# Patient Record
Sex: Female | Born: 1980
Health system: Southern US, Community
[De-identification: ages and names within clinical notes are randomized; demographics above are authoritative.]

## PROBLEM LIST (undated history)

## (undated) DIAGNOSIS — Z8489 Family history of other specified conditions: Secondary | ICD-10-CM

## (undated) DIAGNOSIS — C50912 Malignant neoplasm of unspecified site of left female breast: Secondary | ICD-10-CM

## (undated) DIAGNOSIS — Z853 Personal history of malignant neoplasm of breast: Secondary | ICD-10-CM

## (undated) DIAGNOSIS — T451X5A Adverse effect of antineoplastic and immunosuppressive drugs, initial encounter: Secondary | ICD-10-CM

## (undated) DIAGNOSIS — F419 Anxiety disorder, unspecified: Secondary | ICD-10-CM

## (undated) DIAGNOSIS — L818 Other specified disorders of pigmentation: Secondary | ICD-10-CM

## (undated) DIAGNOSIS — D649 Anemia, unspecified: Secondary | ICD-10-CM

## (undated) HISTORY — PX: PORTACATH PLACEMENT: SHX2246

---
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
## 1999-06-19 HISTORY — PX: WISDOM TOOTH EXTRACTION: SHX21

## 2004-09-02 ENCOUNTER — Emergency Department (HOSPITAL_COMMUNITY): Admission: EM | Admit: 2004-09-02 | Discharge: 2004-09-02 | Payer: Self-pay | Admitting: Emergency Medicine

## 2005-01-15 ENCOUNTER — Emergency Department (HOSPITAL_COMMUNITY): Admission: EM | Admit: 2005-01-15 | Discharge: 2005-01-16 | Payer: Self-pay | Admitting: Emergency Medicine

## 2010-11-17 ENCOUNTER — Other Ambulatory Visit: Payer: Self-pay | Admitting: Obstetrics and Gynecology

## 2010-11-17 ENCOUNTER — Encounter (HOSPITAL_COMMUNITY): Payer: 59

## 2010-11-17 LAB — CBC
HCT: 37.7 % (ref 36.0–46.0)
Hemoglobin: 12.6 g/dL (ref 12.0–15.0)
MCH: 31 pg (ref 26.0–34.0)
MCHC: 33.4 g/dL (ref 30.0–36.0)
MCV: 92.6 fL (ref 78.0–100.0)

## 2010-11-22 ENCOUNTER — Other Ambulatory Visit: Payer: Self-pay | Admitting: Obstetrics and Gynecology

## 2010-11-22 ENCOUNTER — Ambulatory Visit (HOSPITAL_COMMUNITY)
Admission: EM | Admit: 2010-11-22 | Discharge: 2010-11-23 | Disposition: A | Discharge status: Home / Self Care | Payer: 59 | Source: Ambulatory Visit | Attending: Obstetrics and Gynecology | Admitting: Obstetrics and Gynecology

## 2010-11-22 DIAGNOSIS — Z01818 Encounter for other preprocedural examination: Secondary | ICD-10-CM | POA: Insufficient documentation

## 2010-11-22 DIAGNOSIS — N9489 Other specified conditions associated with female genital organs and menstrual cycle: Secondary | ICD-10-CM | POA: Insufficient documentation

## 2010-11-22 DIAGNOSIS — N838 Other noninflammatory disorders of ovary, fallopian tube and broad ligament: Secondary | ICD-10-CM | POA: Insufficient documentation

## 2010-11-22 DIAGNOSIS — D279 Benign neoplasm of unspecified ovary: Secondary | ICD-10-CM | POA: Insufficient documentation

## 2010-11-22 DIAGNOSIS — Z01812 Encounter for preprocedural laboratory examination: Secondary | ICD-10-CM | POA: Insufficient documentation

## 2010-11-22 HISTORY — PX: SALPINGOOPHORECTOMY: SHX82

## 2010-11-23 LAB — CBC
HCT: 31.3 % — ABNORMAL LOW (ref 36.0–46.0)
Hemoglobin: 10.2 g/dL — ABNORMAL LOW (ref 12.0–15.0)
MCH: 30.7 pg (ref 26.0–34.0)
MCV: 94.3 fL (ref 78.0–100.0)
RBC: 3.32 MIL/uL — ABNORMAL LOW (ref 3.87–5.11)

## 2010-11-24 NOTE — Op Note (Signed)
Natasha Torres, Natasha Torres NO.:  [id number]  MEDICAL RECORD NO.:  [id number]  LOCATION:  9312                          FACILITY:  [facility]  PHYSICIAN:  Huel Cote, M.D. DATE OF BIRTH:  1980/10/31  DATE OF PROCEDURE:  11/22/2010 DATE OF DISCHARGE:                              OPERATIVE REPORT   PREOPERATIVE DIAGNOSIS:  Right adnexal mass.  POSTOPERATIVE DIAGNOSIS:  Right adnexal mass with large right ovarian cystic mass.  PROCEDURE:  Minilaparotomy, right salpingo-oophorectomy.  SURGEON:  Huel Cote, MD  ASSISTANT:  Malachi Pro. Ambrose Mantle, MD  ANESTHESIA:  General.  FINDINGS:  The left ovary and tube were normal.  The appendix appeared normal.  The uterus appeared normal.  The right ovary was enlarged with a 10 x 12 cm cystic mass which appeared to include several septations.  SPECIMEN:  The right ovary and tube were sent to Pathology.  ESTIMATED BLOOD LOSS:  50 mL.  URINE OUTPUT:  700 mL.  INTRAVENOUS FLUIDS:  1300 mL LR.  COMPLICATIONS:  There were no known complications.  PROCEDURE:  The patient was taken to the operating room where general anesthesia was obtained without difficulty.  She was then prepped and draped in the normal sterile fashion in the dorsal supine position. With a Foley catheter in place, a very small Pfannenstiel incision was made and carried through to the underlying layer of fascia by sharp dissection and Bovie cautery.  The fascia was then nicked in the midline and the incision extended laterally with Mayo scissors.  The inferior aspect was then grasped with Kocher clamps, elevated, and dissected off the underlying rectus muscles.  In a similar fashion, the superior aspect was dissected off the rectus muscles.  These were separated in the midline and the peritoneal cavity entered bluntly.  There was a small amount of free fluid noted and pelvic washings were then obtained and handed off to Pathology.  The large cystic mass  was readily visible at the midline and looking at the remainder of the pelvis appeared normal.  The left ovary and tube appeared normal and there were no other abnormal findings.  The mass was then carefully elevated and with some effort brought through the incision and was able to be externalized.  A Heaney clamp was then placed across the cornua at the right and a second Heaney clamp was placed across the infundibulopelvic ligament until the adnexa was completely isolated.  This was then removed with Mayo scissors.  The specimen was then handed off to Pathology and the pedicles were secured with two suture ligatures of 0 Vicryl.  One small area of bleeding at the cornua was treated with 3-0 Vicryl in an SH needle.  All then appeared hemostatic.  The left tube and ovary were once again inspected and found to be normal.  A very small paratubal cyst was removed with Bovie as it was a dangling all long pedicle.  The appendix appeared normal.  There was no evidence of endometriosis or other abnormal pelvic findings, therefore all instruments and sponges were removed from the abdomen.  The peritoneum and rectus muscles were then reapproximated with 0 Vicryl and interrupted mattress sutures.  The fascia was  then closed with 0 Vicryl in a running fashion.  The subcutaneous tissue was reapproximated with 2-0 Vicryl in a running fashion.  The skin was then closed with 3-0 Vicryl in a subcuticular stitch on a Keith needle and Dermabond.  Again, sponge, lap, and needle counts were correct x2 and the patient was taken to the recovery room, awakened, and in good condition.     Huel Cote, M.D.     KR/MEDQ  D:  11/22/2010  T:  11/23/2010  Job:  [id number]  Electronically Signed by Huel Cote M.D. on 11/24/2010 11:00:35 PM

## 2010-11-24 NOTE — H&P (Addendum)
NAME:  Natasha Torres, Natasha Torres NO.:  [id number]  MEDICAL RECORD NO.:  [id number]  LOCATION:  SDC                           [facility]:  [facility]  PHYSICIAN:  Huel Cote, M.D. DATE OF BIRTH:  [birth year]  DATE OF ADMISSION:  11/17/2010 DATE OF DISCHARGE:                             HISTORY & PHYSICAL   HISTORY OF PRESENT ILLNESS:  The patient is a 30 year old nulligravida female who is coming in for a scheduled mini laparotomy and excision of a large adnexal mass which was noted on pelvic exam at a routine physical and followed up by ultrasound.  The mass itself measures approximately 12 x 12 cm and has a few septations a 2 or 3 excrescences associated with this.  The patient really has no associated symptoms of pain or issues with the mass, however, given its size and slightly complex nature, we had discussed options and feel that surgical removal is the best option.  I did discuss her case with GYN Oncology prior to proceeding and they felt that by her sonographic findings and her age that the risk of malignancy was very low and that they did not need to be present for the surgery.  However, I did recommend removing with a mini laparotomy to avoid any spillage of the contents at the time of removal and wished [place] to do pelvic washings as well.  They also did not recommend any ovulin testing or blood work as it would not alter her management substantially.  PAST MEDICAL HISTORY:  None.  PAST SURGICAL HISTORY:  Significant only for hand surgery on her thumb.  OBSTETRICAL/GYN HISTORY:  None.  ALLERGIES:  None.  MEDICATIONS:  Oral contraceptives which she is discontinuing.  SOCIAL HISTORY:  Significant for being married for a little over 1-year. She works as a Human resources officer and is interested in pursuing pregnancy in the near future.  FAMILY MEDICAL HISTORY:  Significant for breast cancer in her grandfather and hypertension in her mother, lung cancer in two of  her grandfathers and diabetes in her uncle.  PHYSICAL EXAMINATION:  GENERAL:  The patient's weight is 151 pounds, she is 5 feet 11 inches, blood pressure 100/60. CARDIAC:  Regular rate and rhythm. LUNGS:  Clear. ABDOMEN:  Soft and nontender. PELVIC:  She has a large palpable mass extending from the midline to the right which is not very mobile due to its size.  Uterus feels normal in size and shape.  Again, the patient was counseled as to her options including observation and surgical resection.  The patient desires to proceed with surgical resection and given the size of the mass we have elected to proceed with a mini laparotomy to attempt removal with no spillage of the contents.  She is aware that she will likely loose the ovary on that side which is the right due to the size of the mass and the likely possibility that there will be normal ovary tissue available. Therefore, risks and benefits of the surgery were discussed with the patient including bleeding, infection and possible damage to bowel and bladder.  She understands that she would need a larger incision to repair any inadvertent complications and desires to proceed with  the surgery as stated.     Huel Cote, M.D.     KR/MEDQ  D:  11/21/2010  T:  11/21/2010  Job:  [id number]  Electronically Signed by Huel Cote M.D. on 12/12/2010 09:04:52 AM

## 2010-11-24 NOTE — Discharge Summary (Signed)
  NAMESVEA, PUSCH NO.:  [id number]  MEDICAL RECORD NO.:  [id number]  LOCATION:  9312                          [facility]  PHYSICIAN:  Huel Cote, M.D. DATE OF BIRTH:  [birth year]  DATE OF ADMISSION:  11/22/2010 DATE OF DISCHARGE:  11/23/2010                              DISCHARGE SUMMARY   DISCHARGE DIAGNOSES: 1. Right adnexal mass. 2. Status post minilaparotomy and right salpingo-oophorectomy.  DISCHARGE MEDICATIONS: 1. Motrin 600 mg p.o. every 6 hours. 2. Percocet 1-2 tablets p.o. every 4 hours p.r.n.  DISCHARGE FOLLOWUP:  The patient is to follow up in the office in 2-3 weeks for an incision check.  HOSPITAL COURSE:  The patient is a 30 year old nulligravida female who was brought in for a scheduled minilaparotomy and removal of a large right adnexal mass, measuring approximately 11 cm.  The patient underwent her surgery uneventfully and a RSO was performed 12 x 10 slightly complex appearing mass, mostly cystic.  This was sent to Pathology with pelvic washings and will be followed up after discharge as per GYN Oncology's recommendations.  The patient was then admitted for routine postoperative care.  On postop day #1, she was doing quite well.  She ambulated and voided without difficulty.  She was tolerating regular diet and her pain was well controlled.  Hemoglobin was 10.2 postoperatively.  Abdomen was soft.  Incision was well approximated and clear.  The patient was transitioned to p.o. medications on the morning of discharge and assuming that she tolerates these well, she will be allowed to be discharged home later this afternoon to follow up as stated.     Huel Cote, M.D.     KR/MEDQ  D:  11/23/2010  T:  11/24/2010  Job:  [id number]  Electronically Signed by Huel Cote M.D. on 11/24/2010 11:00:38 PM

## 2011-05-18 LAB — OB RESULTS CONSOLE ABO/RH

## 2011-05-18 LAB — OB RESULTS CONSOLE GBS: GBS: NEGATIVE

## 2011-05-18 LAB — OB RESULTS CONSOLE GC/CHLAMYDIA
Chlamydia: NEGATIVE
Gonorrhea: NEGATIVE

## 2011-05-18 LAB — OB RESULTS CONSOLE HIV ANTIBODY (ROUTINE TESTING): HIV: NONREACTIVE

## 2011-05-18 LAB — OB RESULTS CONSOLE RPR: RPR: NONREACTIVE

## 2011-05-18 LAB — OB RESULTS CONSOLE ANTIBODY SCREEN: Antibody Screen: NEGATIVE

## 2011-05-18 LAB — OB RESULTS CONSOLE RUBELLA ANTIBODY, IGM: Rubella: IMMUNE

## 2011-06-19 NOTE — L&D Delivery Note (Signed)
Delivery Note At 5:26 AM a viable female was delivered via Vaginal, Spontaneous Delivery (Presentation: OA ; ROT  ).  APGAR: 9, 9; weight P .   Placenta status: Intact, Spontaneous.  Cord: 3 vessels with the following complications: None.    Anesthesia: Epidural  Episiotomy: none Lacerations: 2nd degree;Perineal, B labial Suture Repair: 3.0 vicryl rapide Est. Blood Loss (mL): 500  Mom to postpartum.  Baby to with mommy.  BOVARD,Kashmir Lysaght 12/03/2011, 6:07 AM  A+/ Br

## 2011-11-29 ENCOUNTER — Inpatient Hospital Stay (HOSPITAL_COMMUNITY)
Admission: AD | Admit: 2011-11-29 | Discharge: 2011-11-29 | Disposition: A | Discharge status: Home / Self Care | Payer: BC Managed Care – PPO | Source: Ambulatory Visit | Attending: Obstetrics and Gynecology | Admitting: Obstetrics and Gynecology

## 2011-11-29 ENCOUNTER — Encounter (HOSPITAL_COMMUNITY): Payer: Self-pay | Admitting: *Deleted

## 2011-11-29 ENCOUNTER — Other Ambulatory Visit: Payer: Self-pay | Admitting: Obstetrics and Gynecology

## 2011-11-29 DIAGNOSIS — O139 Gestational [pregnancy-induced] hypertension without significant proteinuria, unspecified trimester: Secondary | ICD-10-CM | POA: Insufficient documentation

## 2011-11-29 LAB — COMPREHENSIVE METABOLIC PANEL
ALT: 24 U/L (ref 0–35)
Albumin: 2.7 g/dL — ABNORMAL LOW (ref 3.5–5.2)
BUN: 14 mg/dL (ref 6–23)
CO2: 24 mEq/L (ref 19–32)
Calcium: 9.2 mg/dL (ref 8.4–10.5)
Creatinine, Ser: 0.71 mg/dL (ref 0.50–1.10)
Glucose, Bld: 90 mg/dL (ref 70–99)
Potassium: 4.6 mEq/L (ref 3.5–5.1)
Sodium: 135 mEq/L (ref 135–145)
Total Bilirubin: 0.1 mg/dL — ABNORMAL LOW (ref 0.3–1.2)
Total Protein: 6.4 g/dL (ref 6.0–8.3)

## 2011-11-29 LAB — CBC
Hemoglobin: 11.7 g/dL — ABNORMAL LOW (ref 12.0–15.0)
MCH: 30.2 pg (ref 26.0–34.0)
Platelets: 123 10*3/uL — ABNORMAL LOW (ref 150–400)
RDW: 12.9 % (ref 11.5–15.5)

## 2011-11-29 NOTE — MAU Note (Signed)
Sent from OB's office for [facility] evaluation

## 2011-11-29 NOTE — Discharge Instructions (Signed)

## 2011-12-02 ENCOUNTER — Encounter (HOSPITAL_COMMUNITY): Payer: Self-pay | Admitting: Anesthesiology

## 2011-12-02 ENCOUNTER — Inpatient Hospital Stay (HOSPITAL_COMMUNITY): Payer: BC Managed Care – PPO | Admitting: Anesthesiology

## 2011-12-02 ENCOUNTER — Inpatient Hospital Stay (HOSPITAL_COMMUNITY)
Admission: AD | Admit: 2011-12-02 | Discharge: 2011-12-04 | DRG: 373 | Disposition: A | Discharge status: Home / Self Care | Payer: BC Managed Care – PPO | Source: Ambulatory Visit | Attending: Obstetrics and Gynecology | Admitting: Obstetrics and Gynecology

## 2011-12-02 ENCOUNTER — Encounter (HOSPITAL_COMMUNITY): Payer: Self-pay | Admitting: *Deleted

## 2011-12-02 DIAGNOSIS — IMO0002 Reserved for concepts with insufficient information to code with codable children: Secondary | ICD-10-CM

## 2011-12-02 LAB — CBC
Hemoglobin: 12.1 g/dL (ref 12.0–15.0)
MCH: 31.1 pg (ref 26.0–34.0)
MCHC: 34.5 g/dL (ref 30.0–36.0)

## 2011-12-02 MED ORDER — BUTORPHANOL TARTRATE 2 MG/ML IJ SOLN: 2.0000 mg | INTRAMUSCULAR | Status: DC | PRN

## 2011-12-02 MED ORDER — LACTATED RINGERS IV SOLN: 500.0000 mL | INTRAVENOUS | Status: DC | PRN

## 2011-12-02 MED ORDER — OXYTOCIN BOLUS FROM INFUSION
500.0000 mL | Freq: Once | INTRAVENOUS | Status: DC
  Filled 2011-12-02: qty 500

## 2011-12-02 MED ORDER — PHENYLEPHRINE 40 MCG/ML (10ML) SYRINGE FOR IV PUSH (FOR BLOOD PRESSURE SUPPORT)
80.0000 ug | PREFILLED_SYRINGE | INTRAVENOUS | Status: DC | PRN
  Filled 2011-12-02: qty 5

## 2011-12-02 MED ORDER — IBUPROFEN 600 MG PO TABS: 600.0000 mg | ORAL_TABLET | Freq: Four times a day (QID) | ORAL | Status: DC | PRN

## 2011-12-02 MED ORDER — LACTATED RINGERS IV SOLN
500.0000 mL | Freq: Once | INTRAVENOUS | Status: AC
  Administered 2011-12-02: 23:00:00 via INTRAVENOUS

## 2011-12-02 MED ORDER — OXYTOCIN 20 UNITS IN LACTATED RINGERS INFUSION - SIMPLE
1.0000 m[IU]/min | INTRAVENOUS | Status: DC
  Administered 2011-12-02: 2 m[IU]/min via INTRAVENOUS
  Filled 2011-12-02: qty 1000

## 2011-12-02 MED ORDER — FENTANYL 2.5 MCG/ML BUPIVACAINE 1/10 % EPIDURAL INFUSION (WH - ANES)
14.0000 mL/h | INTRAMUSCULAR | Status: DC
  Administered 2011-12-03: 14 mL/h via EPIDURAL
  Filled 2011-12-02 (×2): qty 60

## 2011-12-02 MED ORDER — PHENYLEPHRINE 40 MCG/ML (10ML) SYRINGE FOR IV PUSH (FOR BLOOD PRESSURE SUPPORT): 80.0000 ug | PREFILLED_SYRINGE | INTRAVENOUS | Status: DC | PRN

## 2011-12-02 MED ORDER — EPHEDRINE 5 MG/ML INJ: 10.0000 mg | INTRAVENOUS | Status: DC | PRN

## 2011-12-02 MED ORDER — FENTANYL 2.5 MCG/ML BUPIVACAINE 1/10 % EPIDURAL INFUSION (WH - ANES)
INTRAMUSCULAR | Status: DC | PRN
  Administered 2011-12-02: 16 mL/h via EPIDURAL

## 2011-12-02 MED ORDER — FLEET ENEMA 7-19 GM/118ML RE ENEM: 1.0000 | ENEMA | RECTAL | Status: DC | PRN

## 2011-12-02 MED ORDER — OXYCODONE-ACETAMINOPHEN 5-325 MG PO TABS: 1.0000 | ORAL_TABLET | ORAL | Status: DC | PRN

## 2011-12-02 MED ORDER — LIDOCAINE HCL (PF) 1 % IJ SOLN
30.0000 mL | INTRAMUSCULAR | Status: DC | PRN
  Filled 2011-12-02: qty 30

## 2011-12-02 MED ORDER — CITRIC ACID-SODIUM CITRATE 334-500 MG/5ML PO SOLN: 30.0000 mL | ORAL | Status: DC | PRN

## 2011-12-02 MED ORDER — EPHEDRINE 5 MG/ML INJ
10.0000 mg | INTRAVENOUS | Status: DC | PRN
  Administered 2011-12-02: 10 mg via INTRAVENOUS
  Filled 2011-12-02: qty 4

## 2011-12-02 MED ORDER — LACTATED RINGERS IV SOLN
INTRAVENOUS | Status: DC
  Administered 2011-12-02 (×2): via INTRAVENOUS

## 2011-12-02 MED ORDER — LIDOCAINE HCL (PF) 1 % IJ SOLN
INTRAMUSCULAR | Status: DC | PRN
  Administered 2011-12-02: 4 mL
  Administered 2011-12-02: 5 mL

## 2011-12-02 MED ORDER — OXYTOCIN 20 UNITS IN LACTATED RINGERS INFUSION - SIMPLE: 125.0000 mL/h | Freq: Once | INTRAVENOUS | Status: DC

## 2011-12-02 MED ORDER — TERBUTALINE SULFATE 1 MG/ML IJ SOLN: 0.2500 mg | Freq: Once | INTRAMUSCULAR | Status: AC | PRN

## 2011-12-02 MED ORDER — ONDANSETRON HCL 4 MG/2ML IJ SOLN: 4.0000 mg | Freq: Four times a day (QID) | INTRAMUSCULAR | Status: DC | PRN

## 2011-12-02 MED ORDER — ACETAMINOPHEN 325 MG PO TABS: 650.0000 mg | ORAL_TABLET | ORAL | Status: DC | PRN

## 2011-12-02 MED ORDER — DIPHENHYDRAMINE HCL 50 MG/ML IJ SOLN
12.5000 mg | INTRAMUSCULAR | Status: DC | PRN
  Administered 2011-12-03: 12.5 mg via INTRAVENOUS
  Filled 2011-12-02: qty 1

## 2011-12-02 NOTE — H&P (Signed)
Natasha Torres is a 31 y.o. female G1P0 with ROM, clear fluid.  Uncomplicated prenatalcare.  +FM, LOF, no VB, occ ctx, increasing in intensity and frequency . Maternal Medical History:  Reason for admission: Reason for admission: rupture of membranes.  Contractions: Frequency: irregular.    Fetal activity: Perceived fetal activity is normal.      OB History    Grav Para Term Preterm Abortions TAB SAB Ect Mult Living   1 0 0 0 0 0 0 0 0 0     G1 present,no abn pap, no STDs, h/o dermoid cyst Past Medical History  Diagnosis Date  . No pertinent past medical history   insomnia Past Surgical History  Procedure Date  . Right oophorectomy   . Wisdom tooth extraction   . Hand surgery      R thumb  h/o dermoid cyst  Family History: family history includes Cancer in her maternal grandfather and paternal grandfather; Depression in her maternal aunt; and Hypertension in her mother. Social History:  reports that she has never smoked. She does not have any smokeless tobacco history on file. She reports that she does not drink alcohol or use illicit drugs.married, Doctor, general practice Meds PNV All NKDA   Prenatal Transfer Tool  Maternal Diabetes: No Genetic Screening: Normal Maternal Ultrasounds/Referrals: Normal Fetal Ultrasounds or other Referrals:  None Maternal Substance Abuse:  No Significant Maternal Medications:  None Significant Maternal Lab Results:  Lab values include: Group B Strep negative Other Comments:  None  Review of Systems  Constitutional: Negative.   HENT: Negative.   Eyes: Negative.   Respiratory: Negative.   Cardiovascular: Negative.   Gastrointestinal: Negative.   Genitourinary: Negative.   Musculoskeletal: Negative.   Skin: Negative.   Neurological: Negative.   Psychiatric/Behavioral: Negative.     Dilation: 3 Station: -3 Exam by:: Ginger Morris RN Blood pressure 122/77, pulse 85, temperature 97.8 F (36.6 C), temperature source Oral, resp. rate  20, height 5\' 11"  (1.803 m), weight 99.791 kg (220 lb). Maternal Exam:  Abdomen: Patient reports no abdominal tenderness. Fundal height is appropriate for gestation.   Fetal presentation: vertex     Physical Exam  Constitutional: She is oriented to person, place, and time. She appears well-developed and well-nourished.  HENT:  Head: Normocephalic and atraumatic.  Eyes: Conjunctivae are normal. Pupils are equal, round, and reactive to light.  Neck: Normal range of motion. Neck supple.  Cardiovascular: Normal rate and regular rhythm.   Respiratory: Effort normal and breath sounds normal. No respiratory distress.  GI: Soft. Bowel sounds are normal. She exhibits no distension.  Musculoskeletal: Normal range of motion.  Neurological: She is alert and oriented to person, place, and time.  Skin: Skin is warm and dry.  Psychiatric: She has a normal mood and affect. Her behavior is normal.    Prenatal labs: ABO, Rh: A/Positive/-- (11/30 1200) Antibody: Negative (11/30 1200) Rubella: Immune (11/30 1200) RPR: Nonreactive (11/30 1200)  HBsAg:   negative HIV: Non-reactive (11/30 1200)  GBS: Negative (11/30 1200)  Plt 181K/ GC neg/ Chl neg/ First Tri Scr WN:/ AFP WNL/glucola 94 hgb 11.7/   [place] EDC 6/22 cwd, nl anat, cwd, female Assessment/Plan: 31 yo G1P0 at 39+ with SROM for augmentation Epidural prn Pitocin to augment prn gbbs neg no prophylaxis Expect SVD  BOVARD,Jovany Disano 12/02/2011, 10:01 PM

## 2011-12-02 NOTE — MAU Note (Signed)
Pt states water broke at 745pm and she states she started contracting as soon as her water broke.

## 2011-12-02 NOTE — Anesthesia Preprocedure Evaluation (Signed)

## 2011-12-02 NOTE — Anesthesia Procedure Notes (Signed)
Epidural Patient location during procedure: OB Start time: 12/02/2011 10:48 PM  Staffing Anesthesiologist: Nihal Marzella A. Performed by: anesthesiologist   Preanesthetic Checklist Completed: patient identified, site marked, surgical consent, pre-op evaluation, timeout performed, IV checked, risks and benefits discussed and monitors and equipment checked  Epidural Patient position: sitting Prep: site prepped and draped and DuraPrep Patient monitoring: continuous pulse ox and blood pressure Approach: midline Injection technique: LOR air  Needle:  Needle type: Tuohy  Needle gauge: 17 G Needle length: 9 cm Needle insertion depth: 5 cm cm Catheter type: closed end flexible Catheter size: 19 Gauge Catheter at skin depth: 10 cm Test dose: negative and Other  Assessment Events: blood not aspirated, injection not painful, no injection resistance, negative IV test and no paresthesia  Additional Notes Patient identified. Risks and benefits discussed including failed block, incomplete  Pain control, post dural puncture headache, nerve damage, paralysis, blood pressure Changes, nausea, vomiting, reactions to medications-both toxic and allergic and post Partum back pain. All questions were answered. Patient expressed understanding and wished to proceed. Sterile technique was used throughout procedure. Epidural site was Dressed with sterile barrier dressing. No paresthesias, signs of intravascular injection Or signs of intrathecal spread were encountered.  Patient was more comfortable after the epidural was dosed. Please see RN's note for documentation of vital signs and FHR which are stable.

## 2011-12-02 NOTE — Progress Notes (Signed)
Pt transferred to room 170

## 2011-12-02 NOTE — Progress Notes (Signed)
Received admission orders

## 2011-12-03 ENCOUNTER — Encounter (HOSPITAL_COMMUNITY): Payer: Self-pay | Admitting: Pediatric Intensive Care

## 2011-12-03 LAB — RPR: RPR Ser Ql: NONREACTIVE

## 2011-12-03 MED ORDER — LANOLIN HYDROUS EX OINT: TOPICAL_OINTMENT | CUTANEOUS | Status: DC | PRN

## 2011-12-03 MED ORDER — DIPHENHYDRAMINE HCL 25 MG PO CAPS: 25.0000 mg | ORAL_CAPSULE | Freq: Four times a day (QID) | ORAL | Status: DC | PRN

## 2011-12-03 MED ORDER — OXYCODONE-ACETAMINOPHEN 5-325 MG PO TABS: 1.0000 | ORAL_TABLET | ORAL | Status: DC | PRN

## 2011-12-03 MED ORDER — WITCH HAZEL-GLYCERIN EX PADS: 1.0000 "application " | MEDICATED_PAD | CUTANEOUS | Status: DC | PRN

## 2011-12-03 MED ORDER — PRENATAL MULTIVITAMIN CH
1.0000 | ORAL_TABLET | Freq: Every day | ORAL | Status: DC
  Administered 2011-12-04: 1 via ORAL
  Filled 2011-12-03: qty 1

## 2011-12-03 MED ORDER — BENZOCAINE-MENTHOL 20-0.5 % EX AERO
1.0000 "application " | INHALATION_SPRAY | CUTANEOUS | Status: DC | PRN
  Administered 2011-12-03 – 2011-12-04 (×2): 1 via TOPICAL
  Filled 2011-12-03 (×2): qty 56

## 2011-12-03 MED ORDER — SENNOSIDES-DOCUSATE SODIUM 8.6-50 MG PO TABS
2.0000 | ORAL_TABLET | Freq: Every day | ORAL | Status: DC
  Administered 2011-12-03: 2 via ORAL

## 2011-12-03 MED ORDER — LACTATED RINGERS IV SOLN: INTRAVENOUS | Status: DC

## 2011-12-03 MED ORDER — SIMETHICONE 80 MG PO CHEW: 80.0000 mg | CHEWABLE_TABLET | ORAL | Status: DC | PRN

## 2011-12-03 MED ORDER — ONDANSETRON HCL 4 MG PO TABS: 4.0000 mg | ORAL_TABLET | ORAL | Status: DC | PRN

## 2011-12-03 MED ORDER — DIBUCAINE 1 % RE OINT: 1.0000 "application " | TOPICAL_OINTMENT | RECTAL | Status: DC | PRN

## 2011-12-03 MED ORDER — ONDANSETRON HCL 4 MG/2ML IJ SOLN: 4.0000 mg | INTRAMUSCULAR | Status: DC | PRN

## 2011-12-03 MED ORDER — IBUPROFEN 600 MG PO TABS
600.0000 mg | ORAL_TABLET | Freq: Four times a day (QID) | ORAL | Status: DC
  Administered 2011-12-03 – 2011-12-04 (×4): 600 mg via ORAL
  Filled 2011-12-03 (×4): qty 1

## 2011-12-03 MED ORDER — PRENATAL MULTIVITAMIN CH: 1.0000 | ORAL_TABLET | Freq: Every day | ORAL | Status: DC

## 2011-12-03 MED ORDER — ZOLPIDEM TARTRATE 5 MG PO TABS: 5.0000 mg | ORAL_TABLET | Freq: Every evening | ORAL | Status: DC | PRN

## 2011-12-03 NOTE — Progress Notes (Signed)
Patient ID: Natasha Torres, female   DOB: April 06, 1981, 31 y.o.   MRN: [id number] 30yo G1P1 s/p SVD this AM, doing well Routine care.

## 2011-12-03 NOTE — Progress Notes (Signed)
MD made aware of pt status: SVE will begin pushing.

## 2011-12-03 NOTE — Progress Notes (Signed)
MD made aware of pt status: uterine contraction pattern, FHT tracing, pitocin settings and last SVE. Will continue to monitor.

## 2011-12-03 NOTE — Progress Notes (Signed)
Pt delivered viable female with APGARS 9,9 SVD. Dr Ellyn Hack present.

## 2011-12-03 NOTE — Anesthesia Postprocedure Evaluation (Signed)
  Anesthesia Post-op Note  Patient: Natasha Torres  Procedure(s) Performed: * No procedures listed *  Patient Location: Mother/Baby  Anesthesia Type: Epidural  Level of Consciousness: awake, alert  and oriented  Airway and Oxygen Therapy: Patient Spontanous Breathing  Post-op Pain: none  Post-op Assessment: Post-op Vital signs reviewed and Patient's Cardiovascular Status Stable  Post-op Vital Signs: Reviewed and stable  Complications: No apparent anesthesia complications

## 2011-12-04 LAB — CBC
HCT: 30.9 % — ABNORMAL LOW (ref 36.0–46.0)
Hemoglobin: 10.3 g/dL — ABNORMAL LOW (ref 12.0–15.0)
MCH: 30.4 pg (ref 26.0–34.0)
MCHC: 33.3 g/dL (ref 30.0–36.0)
MCV: 91.2 fL (ref 78.0–100.0)
RBC: 3.39 MIL/uL — ABNORMAL LOW (ref 3.87–5.11)

## 2011-12-04 MED ORDER — OXYCODONE-ACETAMINOPHEN 5-325 MG PO TABS: 1.0000 | ORAL_TABLET | Freq: Four times a day (QID) | ORAL | Status: AC | PRN

## 2011-12-04 MED ORDER — PRENATAL MULTIVITAMIN CH: 1.0000 | ORAL_TABLET | Freq: Every day | ORAL | Status: DC

## 2011-12-04 MED ORDER — IBUPROFEN 800 MG PO TABS: 800.0000 mg | ORAL_TABLET | Freq: Three times a day (TID) | ORAL | Status: AC | PRN

## 2011-12-04 NOTE — Progress Notes (Signed)
Patient ID: Natasha Torres, female   DOB: Jun 14, 1981, 31 y.o.   MRN: [id number] Pt desires d/c home.  Baby d/c'd.  Willl d/c with motrin/percocet/pnv.  F/u 6 weeks

## 2011-12-04 NOTE — Discharge Summary (Signed)
Obstetric Discharge Summary Reason for Admission: rupture of membranes Prenatal Procedures: none Intrapartum Procedures: spontaneous vaginal delivery Postpartum Procedures: none Complications-Operative and Postpartum: 2nd degree perineal laceration and vaginal laceration Hemoglobin  Date Value Range Status  12/04/2011 10.3* 12.0 - 15.0 g/dL Final     HCT  Date Value Range Status  12/04/2011 30.9* 36.0 - 46.0 % Final    Physical Exam:  General: alert and no distress Lochia: appropriate Uterine Fundus: firm   Discharge Diagnoses: Term Pregnancy-delivered  Discharge Information: Date: 12/04/2011 Activity: pelvic rest Diet: routine Medications: PNV, Ibuprofen and Percocet Condition: stable and improved Instructions: refer to practice specific booklet Discharge to: home Follow-up Information    Follow up with BOVARD,Richell Corker, MD. Schedule an appointment as soon as possible for a visit in 6 weeks.   Contact information:   [address] [place] [address] [phone]          Newborn Data: Live born female  Birth Weight: 7 lb 7.4 oz (3385 g) APGAR: 9, 9  Home with mother.  BOVARD,Sheral Pfahler 12/04/2011, 1:36 PM

## 2011-12-04 NOTE — Progress Notes (Signed)
Post Partum Day 1 Subjective: no complaints, up ad lib, tolerating PO and nl lochia, pain controlled  Objective: Blood pressure 134/77, pulse 95, temperature 98 F (36.7 C), temperature source Oral, resp. rate 18, height 5\' 11"  (1.803 m), weight 99.791 kg (220 lb), SpO2 99.00%, unknown if currently breastfeeding.  Physical Exam:  General: alert and no distress Lochia: appropriate Uterine Fundus: firm   Basename 12/04/11 0540 12/02/11 2145  HGB 10.3* 12.1  HCT 30.9* 35.1*    Assessment/Plan: Plan for discharge tomorrow, Breastfeeding and Lactation consult.  Doing well.  Routine care   LOS: 2 days   BOVARD,Becki Mccaskill 12/04/2011, 8:26 AM

## 2011-12-11 ENCOUNTER — Inpatient Hospital Stay (HOSPITAL_COMMUNITY): Admission: RE | Admit: 2011-12-11 | Payer: BC Managed Care – PPO | Source: Ambulatory Visit

## 2014-04-19 ENCOUNTER — Encounter (HOSPITAL_COMMUNITY): Payer: Self-pay | Admitting: Pediatric Intensive Care

## 2014-06-18 DIAGNOSIS — I427 Cardiomyopathy due to drug and external agent: Secondary | ICD-10-CM

## 2014-06-18 DIAGNOSIS — T451X5A Adverse effect of antineoplastic and immunosuppressive drugs, initial encounter: Secondary | ICD-10-CM

## 2014-06-18 DIAGNOSIS — Z853 Personal history of malignant neoplasm of breast: Secondary | ICD-10-CM

## 2014-06-18 DIAGNOSIS — Z9221 Personal history of antineoplastic chemotherapy: Secondary | ICD-10-CM

## 2014-06-18 HISTORY — DX: Personal history of antineoplastic chemotherapy: Z92.21

## 2014-06-18 HISTORY — DX: Cardiomyopathy due to drug and external agent: I42.7

## 2014-06-18 HISTORY — DX: Cardiomyopathy due to drug and external agent: T45.1X5A

## 2014-06-18 HISTORY — DX: Personal history of malignant neoplasm of breast: Z85.3

## 2014-08-17 DIAGNOSIS — C801 Malignant (primary) neoplasm, unspecified: Secondary | ICD-10-CM

## 2014-08-17 HISTORY — PX: BREAST LUMPECTOMY: SHX2

## 2014-08-17 HISTORY — DX: Malignant (primary) neoplasm, unspecified: C80.1

## 2014-08-18 DIAGNOSIS — R928 Other abnormal and inconclusive findings on diagnostic imaging of breast: Secondary | ICD-10-CM

## 2014-08-18 HISTORY — DX: Other abnormal and inconclusive findings on diagnostic imaging of breast: R92.8

## 2014-09-06 DIAGNOSIS — C50912 Malignant neoplasm of unspecified site of left female breast: Secondary | ICD-10-CM

## 2014-09-06 HISTORY — DX: Malignant neoplasm of unspecified site of left female breast: C50.912

## 2014-09-09 ENCOUNTER — Telehealth: Payer: Self-pay | Admitting: *Deleted

## 2014-09-09 NOTE — Telephone Encounter (Signed)
Pt called requesting 2nd opinion for IDC. Excisional bx at [facility].  Scheduled and confirmed appt with Dr. Lindi Adie on 09/13/13 at 2:00PM Pt will have MR faxed to our office.

## 2014-09-13 ENCOUNTER — Other Ambulatory Visit: Payer: Self-pay | Admitting: *Deleted

## 2014-09-13 DIAGNOSIS — C50919 Malignant neoplasm of unspecified site of unspecified female breast: Secondary | ICD-10-CM

## 2014-09-14 ENCOUNTER — Other Ambulatory Visit (HOSPITAL_BASED_OUTPATIENT_CLINIC_OR_DEPARTMENT_OTHER): Payer: Managed Care, Other (non HMO)

## 2014-09-14 ENCOUNTER — Encounter (HOSPITAL_COMMUNITY): Payer: Self-pay

## 2014-09-14 ENCOUNTER — Ambulatory Visit (HOSPITAL_BASED_OUTPATIENT_CLINIC_OR_DEPARTMENT_OTHER): Payer: Managed Care, Other (non HMO) | Admitting: Hematology and Oncology

## 2014-09-14 VITALS — BP 124/75 | HR 56 | Temp 98.1°F | Resp 18 | Ht 71.0 in | Wt 165.9 lb

## 2014-09-14 DIAGNOSIS — C50412 Malignant neoplasm of upper-outer quadrant of left female breast: Secondary | ICD-10-CM | POA: Diagnosis not present

## 2014-09-14 DIAGNOSIS — C50919 Malignant neoplasm of unspecified site of unspecified female breast: Secondary | ICD-10-CM

## 2014-09-14 DIAGNOSIS — Z170 Estrogen receptor positive status [ER+]: Secondary | ICD-10-CM | POA: Diagnosis not present

## 2014-09-14 LAB — CBC WITH DIFFERENTIAL/PLATELET
BASO%: 0.7 % (ref 0.0–2.0)
Basophils Absolute: 0 10*3/uL (ref 0.0–0.1)
EOS ABS: 0.1 10*3/uL (ref 0.0–0.5)
EOS%: 1.2 % (ref 0.0–7.0)
HCT: 37.4 % (ref 34.8–46.6)
HGB: 12.1 g/dL (ref 11.6–15.9)
LYMPH%: 30.7 % (ref 14.0–49.7)
MCH: 29.2 pg (ref 25.1–34.0)
MCHC: 32.3 g/dL (ref 31.5–36.0)
MCV: 90.2 fL (ref 79.5–101.0)
MONO#: 0.4 10*3/uL (ref 0.1–0.9)
MONO%: 8 % (ref 0.0–14.0)
NEUT#: 3.4 10*3/uL (ref 1.5–6.5)
NEUT%: 59.4 % (ref 38.4–76.8)
Platelets: 239 10*3/uL (ref 145–400)
RBC: 4.15 10*6/uL (ref 3.70–5.45)
RDW: 12.5 % (ref 11.2–14.5)
WBC: 5.6 10*3/uL (ref 3.9–10.3)
lymph#: 1.7 10*3/uL (ref 0.9–3.3)

## 2014-09-14 LAB — COMPREHENSIVE METABOLIC PANEL (CC13)
ALK PHOS: 54 U/L (ref 40–150)
ALT: 14 U/L (ref 0–55)
AST: 21 U/L (ref 5–34)
Albumin: 3.9 g/dL (ref 3.5–5.0)
Anion Gap: 7 mEq/L (ref 3–11)
BUN: 13.7 mg/dL (ref 7.0–26.0)
CO2: 27 meq/L (ref 22–29)
CREATININE: 0.8 mg/dL (ref 0.6–1.1)
Calcium: 9.3 mg/dL (ref 8.4–10.4)
Chloride: 105 mEq/L (ref 98–109)
GLUCOSE: 99 mg/dL (ref 70–140)
POTASSIUM: 4.2 meq/L (ref 3.5–5.1)
Sodium: 139 mEq/L (ref 136–145)
TOTAL PROTEIN: 7.1 g/dL (ref 6.4–8.3)
Total Bilirubin: 0.28 mg/dL (ref 0.20–1.20)

## 2014-09-14 NOTE — Progress Notes (Signed)
[facility] CONSULT NOTE  No care team member to display  CHIEF COMPLAINTS/PURPOSE OF CONSULTATION:  Newly diagnosed breast cancer  HISTORY OF PRESENTING ILLNESS:  Natasha Torres 34 y.o. female is here because of recent diagnosis of Left breast cancer. She felt this mass by herself and went to see her PCP who obbtained mammograms and ultrasound, this revealed 2 masses in breast. This was not biopsied using a needle because of its location to skin. She underwent excisional lumpectomy and was found to have IDC with DCIS that was Er Positive and Her 2 amplified.  I reviewed her records extensively and collaborated the history with the patient.  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 Denies tobacco, alcohol or drugs  FAMILY HISTORY: Extensive cancer history in family ALLERGIES:  is allergic to penicillins.  MEDICATIONS:  Current Outpatient Prescriptions  Medication Sig Dispense Refill  . escitalopram (LEXAPRO) 10 MG tablet Take 10 mg by mouth daily.    . Melatonin 3 MG CAPS Take 1 capsule by mouth 3 (three) times a week.    . Multiple Vitamin (MULTIVITAMIN) tablet Take 1 tablet by mouth daily.    [name] LORazepam (ATIVAN) 0.5 MG tablet Take 0.5 mg by mouth every 8 (eight) hours as needed for anxiety.     No current facility-administered medications for this visit.    REVIEW OF SYSTEMS:   Constitutional: Denies fevers, chills or abnormal night sweats Eyes: Denies blurriness of vision, double vision or watery eyes Ears, nose, mouth, throat, and face: Denies mucositis or sore throat Respiratory: Denies cough,  dyspnea or wheezes Cardiovascular: Denies palpitation, chest discomfort or lower extremity swelling Gastrointestinal:  Denies nausea, heartburn or change in bowel habits Skin: Denies abnormal skin rashes Lymphatics: Denies new lymphadenopathy or easy bruising Neurological:Denies numbness, tingling or new weaknesses Behavioral/Psych: Mood is stable, no new changes  Breast: Soreness from recent surgery All other systems were reviewed with the patient and are negative.  PHYSICAL EXAMINATION: ECOG PERFORMANCE STATUS: 1  Filed Vitals:   09/14/14 1435  BP: 124/75  Pulse: 56  Temp: 98.1 F (36.7 C)  Resp: 18   Filed Weights   09/14/14 1435  Weight: 165 lb 14.4 oz (75.252 kg)    GENERAL:alert, no distress and comfortable SKIN: skin color, texture, turgor are normal, no rashes or significant lesions EYES: normal, conjunctiva are pink and non-injected, sclera clear OROPHARYNX:no exudate, no erythema and lips, buccal mucosa, and tongue normal  NECK: supple, thyroid normal size, non-tender, without nodularity LYMPH:  no palpable lymphadenopathy in the cervical, axillary or inguinal LUNGS: clear to auscultation and percussion with normal breathing effort HEART: regular rate & rhythm and no murmurs and no lower extremity edema ABDOMEN:abdomen soft, non-tender and normal bowel sounds Musculoskeletal:no cyanosis of digits and no clubbing  PSYCH: alert & oriented x 3 with fluent speech NEURO: no focal motor/sensory deficits BREAST:Left breast scar is paplated and its healing well. (exam performed in the presence of a chaperone)   LABORATORY DATA:  I have reviewed the data as listed Lab Results  Component Value Date   WBC 5.6 09/14/2014   HGB 12.1 09/14/2014  HCT 37.4 09/14/2014   MCV 90.2 09/14/2014   PLT 239 09/14/2014   Lab Results  Component Value Date   NA 139 09/14/2014   K 4.2 09/14/2014   CO2 27 09/14/2014    RADIOGRAPHIC STUDIES: I have personally reviewed the  radiological reports and agreed with the findings in the report. 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 The U/S revealed 2 nodules, 1 at 1 o clock and another at 3 o clock position. The Biopsy was difficult because it was superficial. So she underwent lumpectomy excisional biopsy. She came to [place] to discuss treatment options  Recommendation: I discussed with her surgeon in great detail. There was a tumor board at [facility] which reviewed her case and thought she might be a candidate for neo-adjuvant chemo because the tumor board felt that her disease is more extensive than what was found on U/S.  Genetic testing was done and results are pending (patient may elect to wait for this result to divide on the extent of surgery Unilaterla vs bilateral)  1. Obtain MRI breast: If there is a lot of residual disease, I will offer her neo-adjuvant chemo. 2. If there is not much residual disease on MRI, she can undergo mastectomy and reconstruction.  3. After mastectomy, we can start adjuvant chemo with TCH or TCH-Perjeta depending on the final tumor size (only if > 2 cms)  4. If mastectomy is done, She will not need adjuvant XRT 5. She will benefit from adjuvant chemowith Herceptin  Fertility Preservation: we can initiate Zolodex concurrrently with Chemo. All questions were answered. The patient knows to call the clinic with any problems, questions or concerns.    Rulon Eisenmenger, MD 8:16 PM

## 2014-09-14 NOTE — Assessment & Plan Note (Signed)
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 The U/S revealed 2 nodules, 1 at 1 o clock and another at 3 o clock position. The Biopsy was difficult because it was superficial. So she underwent lumpectomy excisional biopsy. She came to [place] to discuss treatment options  Recommendation: I discussed with her surgeon in great detail. There was a tumor board at [facility] which reviewed her case and thought she might be a candidate for neo-adjuvant chemo because the tumor board felt that her disease is more extensive than what was found on U/S  1. Obtain MRI breast: If there is a lot of residual disease, I will offer her neo-adjuvant chemo. 2. If there is not much residual disease on MRI, she can undergo mastectomy and reconstruction.  3. After mastectomy, we can start adjuvant chemo with TCH or TCH-Perjeta depending on the final tumor size (only if > 2 cms)  4. If mastectomy is done, She will not need adjuvant XRT 5. She will benefit from adjuvant chemo.  Fertility Preservation: we can initiate Zolodex concurrrently with Chemo.

## 2014-09-15 ENCOUNTER — Telehealth: Payer: Self-pay | Admitting: Hematology and Oncology

## 2014-09-15 ENCOUNTER — Other Ambulatory Visit: Payer: Self-pay | Admitting: *Deleted

## 2014-09-15 ENCOUNTER — Encounter: Payer: Self-pay | Admitting: *Deleted

## 2014-09-15 DIAGNOSIS — C50919 Malignant neoplasm of unspecified site of unspecified female breast: Secondary | ICD-10-CM

## 2014-09-15 DIAGNOSIS — C50412 Malignant neoplasm of upper-outer quadrant of left female breast: Secondary | ICD-10-CM

## 2014-09-15 NOTE — Progress Notes (Signed)
Note created by Dr. Gudena during office visit. Copy to patient, original to scan. 

## 2014-09-15 NOTE — Progress Notes (Signed)
Received office notes from [facility], sent to scan.

## 2014-09-15 NOTE — Telephone Encounter (Signed)
lvm for pt with [facility] phone # to call and sched Breast MRI

## 2014-09-16 ENCOUNTER — Other Ambulatory Visit: Payer: Self-pay | Admitting: Hematology and Oncology

## 2014-09-16 ENCOUNTER — Other Ambulatory Visit: Payer: Self-pay | Admitting: *Deleted

## 2014-09-16 ENCOUNTER — Telehealth: Payer: Self-pay | Admitting: *Deleted

## 2014-09-16 DIAGNOSIS — C50412 Malignant neoplasm of upper-outer quadrant of left female breast: Secondary | ICD-10-CM

## 2014-09-16 NOTE — Telephone Encounter (Signed)
Called GI to have pt scheduled for breat MRI - pt scheduled and confirmed on 09/20/14. Scheduled and confirmed to see Dr. Iran Planas for plastic consult and Santiago Glad for genetic counseling on 09/17/14 Pt will see Dr. Donne Hazel on 09/22/14 at 3:00PM. All appointments have been confirmed. Instructions and directions given. Reached out to [facility] and SW to contact pt concerning financial questions and support.

## 2014-09-17 ENCOUNTER — Telehealth: Payer: Self-pay | Admitting: Genetic Counselor

## 2014-09-17 ENCOUNTER — Encounter: Payer: Self-pay | Admitting: Genetic Counselor

## 2014-09-17 ENCOUNTER — Encounter: Payer: Self-pay | Admitting: Hematology and Oncology

## 2014-09-17 ENCOUNTER — Ambulatory Visit (HOSPITAL_BASED_OUTPATIENT_CLINIC_OR_DEPARTMENT_OTHER): Payer: Managed Care, Other (non HMO) | Admitting: Genetic Counselor

## 2014-09-17 DIAGNOSIS — Z8042 Family history of malignant neoplasm of prostate: Secondary | ICD-10-CM

## 2014-09-17 DIAGNOSIS — C50412 Malignant neoplasm of upper-outer quadrant of left female breast: Secondary | ICD-10-CM

## 2014-09-17 DIAGNOSIS — Z315 Encounter for genetic counseling: Secondary | ICD-10-CM | POA: Diagnosis not present

## 2014-09-17 DIAGNOSIS — C50912 Malignant neoplasm of unspecified site of left female breast: Secondary | ICD-10-CM | POA: Insufficient documentation

## 2014-09-17 DIAGNOSIS — Z803 Family history of malignant neoplasm of breast: Secondary | ICD-10-CM

## 2014-09-17 DIAGNOSIS — C50919 Malignant neoplasm of unspecified site of unspecified female breast: Secondary | ICD-10-CM | POA: Insufficient documentation

## 2014-09-17 DIAGNOSIS — Z801 Family history of malignant neoplasm of trachea, bronchus and lung: Secondary | ICD-10-CM | POA: Diagnosis not present

## 2014-09-17 HISTORY — DX: Malignant neoplasm of unspecified site of left female breast: C50.912

## 2014-09-17 NOTE — Progress Notes (Signed)
I called and left a message patient to call me back. Per Dawn she has some financial concerns and will be getting treatment here.

## 2014-09-17 NOTE — Telephone Encounter (Signed)
Spoke to [employer], the person who facilitated genetic testing for Arabelle.  BRCA1/BRCA2 genetic testing was the only thing ordered, no panel testing.  Per Jeani Hawking, her provider specifically asked that only BRCA testing be ordered, despite the patient being young and qualifying for TP53 testing. Jeani Hawking tried to get a panel added to the BRCA testing and was not able to do this.  When I see the patient I will see if we can get testing on her.

## 2014-09-17 NOTE — Progress Notes (Signed)
REFERRING PROVIDER: Nicholas Lose, MD  Rolm Bookbinder, MD   PRIMARY PROVIDER:  No primary care provider on file.  PRIMARY REASON FOR VISIT:  1. Breast cancer of upper-outer quadrant of left female breast   2. Family history of breast cancer in female   3. Family history of prostate cancer      HISTORY OF PRESENT ILLNESS:   Natasha Torres, a 34 y.o. female, was seen for a [place] cancer genetics consultation at the request of Dr. Lindi Adie due to a personal and family history of cancer.  Natasha Torres presents to clinic today to discuss the possibility of a hereditary predisposition to cancer, genetic testing, and to further clarify her future cancer risks, as well as potential cancer risks for family members.   In March 2016, at the age of 32, Natasha Torres was diagnosed with invasive ductal carcinoma and DCIS of the left berast. This will be treated with chemotherapy and a double mastectomy.  Natasha Torres does not think she will need radiation.  The tumor is ER+/PR-/Her2+. Natasha Torres was originally seen at [facility], and had BRCA1 and BRCA2 testing only performed in order to get results back ASAP.  Natasha Torres is expecting those results to be back today or Monday.   CANCER HISTORY:    Breast cancer of upper-outer quadrant of left female breast   08/27/2014 Initial Diagnosis Excisional biopsy: 2 lumps showing 1.5 cm and 0.9 cm IDC, Grade 3, ER Pos, PR Neg and HER-2 amplified Ratio 2.6, Multifocal     HORMONAL RISK FACTORS:  Menarche was at age 59.  First live birth at age 51.  OCP use for approximately 4-5 years and depoprovera for 10 years years.  Ovaries intact: yes.  Hysterectomy: no.  Menopausal status: premenopausal.  HRT use: 0 years. Colonoscopy: no; not examined. Mammogram within the last year: yes. Number of breast biopsies: 2. Up to date with pelvic exams:  yes. Any excessive radiation exposure in the past:  no  Past Medical History  Diagnosis Date   . Breast cancer 2016    ER+/PR-/Her2+    History reviewed. No pertinent past surgical history.  History   Social History  . Marital Status: Married    Spouse Name: Harrell Gave  . Number of Children: 1  . Years of Education: N/A   Social History Main Topics  . Smoking status: Former Smoker -- 1.00 packs/day    Types: Cigarettes    Quit date: 09/17/1994  . Smokeless tobacco: Not on file  . Alcohol Use: Yes     Comment: 7-8 per week  . Drug Use: Not on file  . Sexual Activity: Not on file   Other Topics Concern  . None   Social History Narrative  . None     FAMILY HISTORY:  We obtained a detailed, 4-generation family history.  Significant diagnoses are listed below: Family History  Problem Relation Age of Onset  . Prostate cancer Father 70  . Dementia Maternal Grandmother   . Heart attack Maternal Grandfather   . Dementia Paternal Grandmother   . Kidney cancer Paternal Grandmother     slow growing, no treatment  . Prostate cancer Paternal Grandfather 89  . Bone cancer Paternal Grandfather 63  . Breast cancer Paternal Grandfather 16  . Lung cancer Paternal Grandfather     dx late 68s; smoker.  thought to be a 4th primary cancer  . Lung cancer Other     smoker  . Prostate cancer Other  MGMs 1/2 brother   Natasha Torres has a full brother who is healthy and a maternal half brother who passed away at age 72 from homicide.  Her father was diagnosed with prostate cancer at 84 and her mother is healthy with no cancer.  Her father has one sister and two brothers, none of whom have cancer.  Her paternal grandfather had four primary cancers - prostate cancer at 68, bone cancer at 31, breast cancer at 7 and lung cancer (smoker) in his late 17s.  Natasha Torres paternal grandmother had kidney cancer and died of dementia at 58.  Her sister had lung cancer.  Natasha Torres maternal grandmother's half brother was diagnosed with prostate cancer in his 84s. Patient's maternal  ancestors are of [place], [place] and [place] descent, and paternal ancestors are of [place] descent. There is no reported Ashkenazi Jewish ancestry. There is no known consanguinity.  GENETIC COUNSELING ASSESSMENT: Natasha Torres is a 34 y.o. female with a personal history of breast cancer and family history of prostate and female breast cancer which somewhat suggestive of a hereditary breast cancer syndrome and predisposition to cancer. We, therefore, discussed and recommended the following at today's visit.   DISCUSSION: We reviewed the characteristics, features and inheritance patterns of hereditary cancer syndromes. We discussed that the most common hereditary cancer syndrome with a family history of female breast cancer is due to BRCA mutations, most commonly a BRCA2 mutation.  Other genes associated with female breast cancer include PALB2 and CHEK2 mutations.  Additionally, based on her early age of onset, she meets criteria for TP53 testing. However, there is not a family history of TP53 cancers (young age of onset, sarcoma's, etc...).  We discussed that once her results come back, if she is positive for BRCA mutations, we could stop with that testing, but if she is negative, we could proceed with testing additional genes.    Natasha Torres was upset that additional genes were not tested and that they do not have complete information.  We discussed that the testing they received was appropriate and will identify the greatest risk for a hereditary cancer syndrome based on her family history.  Despite this reassurance, he was still uneasy about not obtaining information about other hereditary cancer genes.We could try to get [facility] to perform Update testing, where they reflex to the remainder of their cancer gene panel, and if they are not able to do that, we can order panel testing through another laboratory.    Mr. And Mrs. Torres were concerned about insurance covering testing. We discussed that there is  a chance that since she recently had BRCA testing, she could have "used up" her genetic testing options, as many times panel testing is billed using the same billing codes as the ones used for BRCA1 and BRCA2 testing.However, we may be able to do a custom panel through another laboratory to include the remainder of the panel genes we typically test for, trying to bill under specific genes that may get covered.  At the end of the session, we heard from [facility] that they will perform Update testing on the patient as a one time exception.  We asked that they proceed with doing this as long as it will not slow down the current BRCA testing.  We also discussed genetic testing, including the appropriate family members to test, the process of testing, insurance coverage and turn-around-time for results. We discussed the implications of a negative, positive and/or variant of uncertain significant result. We  recommended Ms. Lasota pursue Update genetic testing for the [facility] gene panel. The [facility] gene panel offered by [employer] includes sequencing and deletion/duplication testing of the following 25 genes: APC, ATM, BARD1, BMPR1A, BRCA1, BRCA2, BRIP1, CHD1, CDK4, CDKN2A, CHEK2, EPCAM (large rearrangement only), MLH1, MSH2, MSH6, MUTYH, NBN, PALB2, PMS2, PTEN, RAD51C, RAD51D, SMAD4, STK11, and TP53.    PLAN: After considering the risks, benefits, and limitations, Ms. Maguire  provided informed consent to add [facility] Update testing. Results should be available within approximately 2-3 weeks' time.  It is unclear to me on whether I will get these results or if the results will go to Celesta Gentile at [facility] in [place]. Ms. Vollman signed a medical release form so that we will obtain access to her genetic test results at some point, but it may not be immediate.  She will contact us once her results are complete and back so we can discuss the results and how to proceed. This  information will also be available in Epic. We encouraged Ms. Segler to remain in contact with cancer genetics annually so that we can continuously update the family history and inform her of any changes in cancer genetics and testing that may be of benefit for her family. Ms. Macquarrie questions were answered to her satisfaction today. Our contact information was provided should additional questions or concerns arise.  Lastly, we encouraged Ms. Skorupski to remain in contact with cancer genetics annually so that we can continuously update the family history and inform her of any changes in cancer genetics and testing that may be of benefit for this family.   Ms.  Orihuela questions were answered to her satisfaction today. Our contact information was provided should additional questions or concerns arise. Thank you for the referral and allowing [place] to share in the care of your patient.   Vieno Tarrant P. Florene Glen, [place], [facility] Certified Genetic Counselor [email or web address] phone: [phone]  The patient was seen for a total of 60 minutes in face-to-face genetic counseling.  This patient was discussed with Drs. Magrinat, Lindi Adie and/or Burr Medico who agrees with the above.    _______________________________________________________________________ For Office Staff:  Number of people involved in session: 2 Was an Intern/ student involved with case: no

## 2014-09-20 ENCOUNTER — Ambulatory Visit
Admission: RE | Admit: 2014-09-20 | Discharge: 2014-09-20 | Disposition: A | Discharge status: Home / Self Care | Payer: Managed Care, Other (non HMO) | Source: Ambulatory Visit | Attending: Hematology and Oncology | Admitting: Hematology and Oncology

## 2014-09-20 ENCOUNTER — Other Ambulatory Visit: Payer: Self-pay | Admitting: Hematology and Oncology

## 2014-09-20 ENCOUNTER — Telehealth: Payer: Self-pay | Admitting: *Deleted

## 2014-09-20 ENCOUNTER — Other Ambulatory Visit: Payer: Self-pay

## 2014-09-20 DIAGNOSIS — C50412 Malignant neoplasm of upper-outer quadrant of left female breast: Secondary | ICD-10-CM

## 2014-09-20 MED ORDER — LIDOCAINE-PRILOCAINE 2.5-2.5 % EX CREA: TOPICAL_CREAM | CUTANEOUS | Status: DC

## 2014-09-20 MED ORDER — ONDANSETRON HCL 8 MG PO TABS: 8.0000 mg | ORAL_TABLET | Freq: Two times a day (BID) | ORAL | Status: DC

## 2014-09-20 MED ORDER — DEXAMETHASONE 4 MG PO TABS: 4.0000 mg | ORAL_TABLET | Freq: Two times a day (BID) | ORAL | Status: DC

## 2014-09-20 MED ORDER — PROCHLORPERAZINE MALEATE 10 MG PO TABS: 10.0000 mg | ORAL_TABLET | Freq: Four times a day (QID) | ORAL | Status: DC | PRN

## 2014-09-20 MED ORDER — DEXAMETHASONE 4 MG PO TABS
4.0000 mg | ORAL_TABLET | Freq: Two times a day (BID) | ORAL | Status: DC
Start: 2014-09-20 — End: 2015-01-17

## 2014-09-20 MED ORDER — LORAZEPAM 0.5 MG PO TABS: 0.5000 mg | ORAL_TABLET | Freq: Four times a day (QID) | ORAL | Status: DC | PRN

## 2014-09-20 MED ORDER — GADOBENATE DIMEGLUMINE 529 MG/ML IV SOLN
15.0000 mL | Freq: Once | INTRAVENOUS | Status: AC | PRN
  Administered 2014-09-20: 15 mL via INTRAVENOUS

## 2014-09-20 MED ORDER — PROCHLORPERAZINE MALEATE 10 MG PO TABS
10.0000 mg | ORAL_TABLET | Freq: Four times a day (QID) | ORAL | Status: DC | PRN
Start: 2014-09-20 — End: 2014-09-20

## 2014-09-20 NOTE — Telephone Encounter (Signed)
Electronic Refilled home medications, called in prescription for Ativan.

## 2014-09-20 NOTE — Progress Notes (Signed)
Office notes rcvd from [facility] 2nd opinion.  Reviewed by Dr. Lindi Adie.  Sent to scan.   MRI results rcvd dtd 4/4.  Reviewed by Dr. Lindi Adie.  Sent to scan.

## 2014-09-20 NOTE — Telephone Encounter (Signed)
WOULD LIKE TO DISCUSS TREATMENT OPTIONS FOR HER BREAST CANCER AS SOON AS POSSIBLE. PT. HAS A MRI TODAY AT 9:00AM OTHERWISE SHE WILL BE AVAILABLE VIA HER CELL PHONE [phone]. THIS NOTE ROUTED TO [place].

## 2014-09-20 NOTE — Progress Notes (Unsigned)
This is not a clinic visit: summary of discussions  Assessment and plan 1. Pathology review: Upon further review by our pathologist, the specimen and slides suggest that this may be one contiguous tumor instead of 2 separate tumors that makes it T2.N1?M? ( at least stage IIA/B) 2. Breast MRI was reviewed with the patient on the telephone: It showed very large hematoma in the left breast in addition to a separate nodule in the upper outer quadrant measuring 0.9 cm andleft axillary lymph node measuring 1.2 cm as well as a right breast lesion measuring 1 cm.  Recommendation: 1. PET/CT scan to complete staging given the lymph node involvement 2. Obtain biopsies of second lesion in the left breast and left axilla as well as a right breast 3. Neoadjuvant chemotherapy with Taxotere, carboplatin, Herceptin, Perjeta to start 09/27/2014 6 cycles followed by Herceptin maintenance and surgery 4. Echocardiogram pre-Herceptin 5. Chemotherapy education class 6. Port placement this Friday by her surgeon in [facility] to start chemotherapy 4/ 11/ 2016.

## 2014-09-21 ENCOUNTER — Telehealth: Payer: Self-pay

## 2014-09-21 ENCOUNTER — Other Ambulatory Visit: Payer: Self-pay | Admitting: Hematology and Oncology

## 2014-09-21 ENCOUNTER — Telehealth: Payer: Self-pay | Admitting: *Deleted

## 2014-09-21 ENCOUNTER — Telehealth: Payer: Self-pay | Admitting: Hematology and Oncology

## 2014-09-21 ENCOUNTER — Encounter: Payer: Self-pay | Admitting: Genetic Counselor

## 2014-09-21 ENCOUNTER — Encounter (HOSPITAL_COMMUNITY): Payer: Self-pay | Admitting: Pediatric Intensive Care

## 2014-09-21 DIAGNOSIS — R928 Other abnormal and inconclusive findings on diagnostic imaging of breast: Secondary | ICD-10-CM

## 2014-09-21 DIAGNOSIS — C50412 Malignant neoplasm of upper-outer quadrant of left female breast: Secondary | ICD-10-CM

## 2014-09-21 DIAGNOSIS — C50919 Malignant neoplasm of unspecified site of unspecified female breast: Secondary | ICD-10-CM

## 2014-09-21 NOTE — Telephone Encounter (Signed)
Office notes rcvd from [facility] dtd 09/19/14.  Reviewed by Dr. Lindi Adie.  Sent to scan.

## 2014-09-21 NOTE — Progress Notes (Signed)
Received FAX from Lynn Norris from [facility] in [place].  BRCA and BART testing through [facility] is negative.  The remained of the panel is pending and will be available in approximately 2-3 weeks. 

## 2014-09-21 NOTE — Telephone Encounter (Signed)
Office notes dtd 09/17/14 rcvd from Dr. Iran Planas.  Reviewed by Dr. Lindi Adie.  Sent to scan.

## 2014-09-21 NOTE — Telephone Encounter (Signed)
Spoke with patient and she is aware of her appointments   anne

## 2014-09-21 NOTE — Telephone Encounter (Signed)
Spoke to pt concerning future appts. Confirmed appt with Dr. Donne Hazel on 4/6 at 3:00. Request pt have Dr. Brigitte Pulse office to send op note for port placement stating the location of tip of port-a-cath on 09/24/14. Received verbal understanding. Pt relate she will call Dr. Brigitte Pulse office to give them the information.

## 2014-09-21 NOTE — Telephone Encounter (Signed)
Per staff message and POF I have scheduled appts. Advised scheduler of appts. JMW  

## 2014-09-22 ENCOUNTER — Ambulatory Visit
Admission: RE | Admit: 2014-09-22 | Discharge: 2014-09-22 | Disposition: A | Discharge status: Home / Self Care | Payer: Managed Care, Other (non HMO) | Source: Ambulatory Visit | Attending: Hematology and Oncology | Admitting: Hematology and Oncology

## 2014-09-22 ENCOUNTER — Telehealth: Payer: Self-pay

## 2014-09-22 DIAGNOSIS — R928 Other abnormal and inconclusive findings on diagnostic imaging of breast: Secondary | ICD-10-CM

## 2014-09-22 NOTE — Telephone Encounter (Signed)
Genetics results from [facility] dtd 09/16/2014. Reviewed by Dr Lindi Adie.  Sent to scan.

## 2014-09-23 ENCOUNTER — Other Ambulatory Visit: Payer: Self-pay | Admitting: Hematology and Oncology

## 2014-09-23 ENCOUNTER — Other Ambulatory Visit: Payer: Self-pay | Admitting: *Deleted

## 2014-09-23 ENCOUNTER — Encounter: Payer: Self-pay | Admitting: *Deleted

## 2014-09-23 ENCOUNTER — Telehealth: Payer: Self-pay | Admitting: *Deleted

## 2014-09-23 ENCOUNTER — Other Ambulatory Visit: Payer: Managed Care, Other (non HMO)

## 2014-09-23 ENCOUNTER — Ambulatory Visit (HOSPITAL_COMMUNITY)
Admission: RE | Admit: 2014-09-23 | Discharge: 2014-09-23 | Disposition: A | Discharge status: Home / Self Care | Payer: Managed Care, Other (non HMO) | Source: Ambulatory Visit | Attending: Hematology and Oncology | Admitting: Hematology and Oncology

## 2014-09-23 ENCOUNTER — Encounter (HOSPITAL_COMMUNITY): Admission: RE | Admit: 2014-09-23 | Payer: Managed Care, Other (non HMO) | Source: Ambulatory Visit

## 2014-09-23 DIAGNOSIS — C50412 Malignant neoplasm of upper-outer quadrant of left female breast: Secondary | ICD-10-CM

## 2014-09-23 DIAGNOSIS — Z01818 Encounter for other preprocedural examination: Secondary | ICD-10-CM | POA: Insufficient documentation

## 2014-09-23 NOTE — Telephone Encounter (Signed)
Call from patient asking if Emla cream has been called in to [facility]. Pt. Getting PAC inserted tomorrow. Informed pt that it was called in to pharmacy on 09/20/14. Patient states she will pick up this afternoon

## 2014-09-23 NOTE — Progress Notes (Signed)
Informed that her insurance denied her pet scan.  Dr. Lindi Adie was unable to get it approved.  Ordered CT c/a/p STAT and was able to get her an appointment for 09/24/14 at 8am at [facility].  Spoke with patient today after her chemo education class.  She is aware of her appointment and instructions.  She is also getting her port put in tomorrow in [facility] in the afternoon.  Encouraged her to call with any needs or concerns.

## 2014-09-23 NOTE — Progress Notes (Signed)
2D Echocardiogram has been performed.  Natasha Torres Natasha Torres 09/23/2014, 8:45 AM

## 2014-09-24 ENCOUNTER — Ambulatory Visit (HOSPITAL_COMMUNITY)
Admission: RE | Admit: 2014-09-24 | Discharge: 2014-09-24 | Disposition: A | Discharge status: Home / Self Care | Payer: Managed Care, Other (non HMO) | Source: Ambulatory Visit | Attending: Hematology and Oncology | Admitting: Hematology and Oncology

## 2014-09-24 ENCOUNTER — Other Ambulatory Visit: Payer: Self-pay

## 2014-09-24 ENCOUNTER — Encounter (HOSPITAL_COMMUNITY): Payer: Self-pay

## 2014-09-24 DIAGNOSIS — C50919 Malignant neoplasm of unspecified site of unspecified female breast: Secondary | ICD-10-CM | POA: Diagnosis present

## 2014-09-24 DIAGNOSIS — C50412 Malignant neoplasm of upper-outer quadrant of left female breast: Secondary | ICD-10-CM

## 2014-09-24 MED ORDER — IOHEXOL 300 MG/ML  SOLN
100.0000 mL | Freq: Once | INTRAMUSCULAR | Status: AC | PRN
  Administered 2014-09-24: 100 mL via INTRAVENOUS

## 2014-09-27 ENCOUNTER — Ambulatory Visit (HOSPITAL_BASED_OUTPATIENT_CLINIC_OR_DEPARTMENT_OTHER): Payer: Managed Care, Other (non HMO) | Admitting: Hematology and Oncology

## 2014-09-27 ENCOUNTER — Encounter: Payer: Self-pay | Admitting: *Deleted

## 2014-09-27 ENCOUNTER — Telehealth: Payer: Self-pay | Admitting: Hematology and Oncology

## 2014-09-27 ENCOUNTER — Other Ambulatory Visit (HOSPITAL_BASED_OUTPATIENT_CLINIC_OR_DEPARTMENT_OTHER): Payer: Managed Care, Other (non HMO)

## 2014-09-27 ENCOUNTER — Ambulatory Visit (HOSPITAL_BASED_OUTPATIENT_CLINIC_OR_DEPARTMENT_OTHER): Payer: Managed Care, Other (non HMO)

## 2014-09-27 VITALS — BP 146/70 | HR 59 | Temp 98.2°F | Resp 18 | Ht 71.0 in | Wt 166.8 lb

## 2014-09-27 DIAGNOSIS — Z5111 Encounter for antineoplastic chemotherapy: Secondary | ICD-10-CM

## 2014-09-27 DIAGNOSIS — C50412 Malignant neoplasm of upper-outer quadrant of left female breast: Secondary | ICD-10-CM

## 2014-09-27 DIAGNOSIS — Z5112 Encounter for antineoplastic immunotherapy: Secondary | ICD-10-CM | POA: Diagnosis not present

## 2014-09-27 LAB — CBC WITH DIFFERENTIAL/PLATELET
BASO%: 0.2 % (ref 0.0–2.0)
Basophils Absolute: 0 10*3/uL (ref 0.0–0.1)
EOS%: 0.1 % (ref 0.0–7.0)
Eosinophils Absolute: 0 10*3/uL (ref 0.0–0.5)
HEMATOCRIT: 36.7 % (ref 34.8–46.6)
HEMOGLOBIN: 12.2 g/dL (ref 11.6–15.9)
LYMPH%: 13.1 % — ABNORMAL LOW (ref 14.0–49.7)
MCH: 29.7 pg (ref 25.1–34.0)
MCHC: 33.3 g/dL (ref 31.5–36.0)
MCV: 89.4 fL (ref 79.5–101.0)
MONO#: 0.6 10*3/uL (ref 0.1–0.9)
MONO%: 7.5 % (ref 0.0–14.0)
NEUT#: 6.4 10*3/uL (ref 1.5–6.5)
NEUT%: 79.1 % — ABNORMAL HIGH (ref 38.4–76.8)
Platelets: 167 10*3/uL (ref 145–400)
RBC: 4.11 10*6/uL (ref 3.70–5.45)
RDW: 12.7 % (ref 11.2–14.5)
WBC: 8.1 10*3/uL (ref 3.9–10.3)
lymph#: 1.1 10*3/uL (ref 0.9–3.3)

## 2014-09-27 LAB — COMPREHENSIVE METABOLIC PANEL (CC13)
ALT: 14 U/L (ref 0–55)
ANION GAP: 11 meq/L (ref 3–11)
AST: 25 U/L (ref 5–34)
Albumin: 4 g/dL (ref 3.5–5.0)
Alkaline Phosphatase: 53 U/L (ref 40–150)
BILIRUBIN TOTAL: 0.47 mg/dL (ref 0.20–1.20)
BUN: 10.1 mg/dL (ref 7.0–26.0)
CO2: 22 meq/L (ref 22–29)
Calcium: 9.1 mg/dL (ref 8.4–10.4)
Chloride: 109 mEq/L (ref 98–109)
Creatinine: 0.7 mg/dL (ref 0.6–1.1)
EGFR: >90 mL/min/{1.73_m2} (ref >90)
Glucose: 136 mg/dl (ref 70–140)
POTASSIUM: 3.7 meq/L (ref 3.5–5.1)
SODIUM: 141 meq/L (ref 136–145)
TOTAL PROTEIN: 7.3 g/dL (ref 6.4–8.3)

## 2014-09-27 MED ORDER — TRASTUZUMAB CHEMO INJECTION 440 MG
8.0000 mg/kg | Freq: Once | INTRAVENOUS | Status: AC
  Administered 2014-09-27: 609 mg via INTRAVENOUS
  Filled 2014-09-27: qty 29

## 2014-09-27 MED ORDER — ACETAMINOPHEN 325 MG PO TABS
650.0000 mg | ORAL_TABLET | Freq: Once | ORAL | Status: AC
  Administered 2014-09-27: 650 mg via ORAL

## 2014-09-27 MED ORDER — PALONOSETRON HCL INJECTION 0.25 MG/5ML
INTRAVENOUS | Status: AC
  Filled 2014-09-27: qty 5

## 2014-09-27 MED ORDER — SODIUM CHLORIDE 0.9 % IJ SOLN
10.0000 mL | INTRAMUSCULAR | Status: DC | PRN
  Administered 2014-09-27: 10 mL
  Filled 2014-09-27: qty 10

## 2014-09-27 MED ORDER — PALONOSETRON HCL INJECTION 0.25 MG/5ML
0.2500 mg | Freq: Once | INTRAVENOUS | Status: AC
  Administered 2014-09-27: 0.25 mg via INTRAVENOUS

## 2014-09-27 MED ORDER — DIPHENHYDRAMINE HCL 25 MG PO CAPS
50.0000 mg | ORAL_CAPSULE | Freq: Once | ORAL | Status: AC
  Administered 2014-09-27: 50 mg via ORAL

## 2014-09-27 MED ORDER — CARBOPLATIN CHEMO INJECTION 600 MG/60ML
750.0000 mg | Freq: Once | INTRAVENOUS | Status: AC
  Administered 2014-09-27: 750 mg via INTRAVENOUS
  Filled 2014-09-27: qty 75

## 2014-09-27 MED ORDER — HEPARIN SOD (PORK) LOCK FLUSH 100 UNIT/ML IV SOLN
500.0000 [IU] | Freq: Once | INTRAVENOUS | Status: AC | PRN
  Administered 2014-09-27: 500 [IU]
  Filled 2014-09-27: qty 5

## 2014-09-27 MED ORDER — DIPHENHYDRAMINE HCL 25 MG PO CAPS
ORAL_CAPSULE | ORAL | Status: AC
  Filled 2014-09-27: qty 2

## 2014-09-27 MED ORDER — SODIUM CHLORIDE 0.9 % IV SOLN
Freq: Once | INTRAVENOUS | Status: AC
  Administered 2014-09-27: 15:00:00 via INTRAVENOUS
  Filled 2014-09-27: qty 1

## 2014-09-27 MED ORDER — DOCETAXEL CHEMO INJECTION 160 MG/16ML
75.0000 mg/m2 | Freq: Once | INTRAVENOUS | Status: AC
  Administered 2014-09-27: 150 mg via INTRAVENOUS
  Filled 2014-09-27: qty 15

## 2014-09-27 MED ORDER — SODIUM CHLORIDE 0.9 % IV SOLN: Freq: Once | INTRAVENOUS | Status: DC

## 2014-09-27 MED ORDER — ACETAMINOPHEN 325 MG PO TABS
ORAL_TABLET | ORAL | Status: AC
Start: 2014-09-27 — End: 2014-09-27
  Filled 2014-09-27: qty 2

## 2014-09-27 MED ORDER — PERTUZUMAB CHEMO INJECTION 420 MG/14ML
840.0000 mg | Freq: Once | INTRAVENOUS | Status: AC
  Administered 2014-09-27: 840 mg via INTRAVENOUS
  Filled 2014-09-27: qty 28

## 2014-09-27 MED ORDER — GOSERELIN ACETATE 3.6 MG ~~LOC~~ IMPL
3.6000 mg | DRUG_IMPLANT | Freq: Once | SUBCUTANEOUS | Status: AC
  Administered 2014-09-27: 3.6 mg via SUBCUTANEOUS
  Filled 2014-09-27: qty 3.6

## 2014-09-27 MED ORDER — SODIUM CHLORIDE 0.9 % IV SOLN
Freq: Once | INTRAVENOUS | Status: AC
  Administered 2014-09-27: 10:00:00 via INTRAVENOUS

## 2014-09-27 MED ORDER — PEGFILGRASTIM 6 MG/0.6ML ~~LOC~~ PSKT
6.0000 mg | PREFILLED_SYRINGE | Freq: Once | SUBCUTANEOUS | Status: AC
  Administered 2014-09-27: 6 mg via SUBCUTANEOUS
  Filled 2014-09-27: qty 0.6

## 2014-09-27 NOTE — Progress Notes (Unsigned)
Met with pt during 1st chemo treatment. Relate she is doing well. Denies questions or needs at this time. Pt has future appts. Encourage to call with concerns. Received verbal understanding.

## 2014-09-27 NOTE — Progress Notes (Signed)
Patient brought copy of CXR from [facility] verifying tip placement. Copy sent to be scanned. Port-a-cath information entered on flowsheets.

## 2014-09-27 NOTE — Telephone Encounter (Signed)
Appointments made and avs pritned for patient °

## 2014-09-27 NOTE — Assessment & Plan Note (Signed)
Left breast invasive ductal carcinoma with DCIS, grade 3, ER 100% positive, PR negative, HER-2 amplified ratio 2.6 status post excisional biopsy at [facility] with positive margins, T2/T3 N0 M0 stage II A/II B clinical stage (additional biopsies involving left breast, left axilla and right breast were benign) BRCA1 and 2 negative full panel pending.  Treatment plan: Neoadjuvant chemotherapy with [place] followed by mastectomy with reconstruction with adjuvant maintenance Herceptin, along with anti-estrogen therapy with tamoxifen 10 years Current treatment: Cycle 1 day 1 of [place] Perjeta  Chemotherapy monitoring: 1. Blood counts were reviewed that adequate for treatment 2. Echocardiogram was normal EF 55-60% on 09/23/2014 3. Port has been placed last Friday  Antiemetic regimen was discussed with her in great detail. She started on dexamethasone as of yesterday. Return to clinic in 1 week for toxicity check.

## 2014-09-27 NOTE — Progress Notes (Signed)
Patient Care Team: Loraine Leriche, MD as PCP - General (Internal Medicine)  DIAGNOSIS: No matching staging information was found for the patient.  SUMMARY OF ONCOLOGIC HISTORY:   Breast cancer of upper-outer quadrant of left female breast   08/27/2014 Initial Diagnosis Excisional biopsy: 2 lumps showing 1.5 cm and 0.9 cm IDC, Grade 3, ER Pos, PR Neg and HER-2 amplified Ratio 2.6, Multifocal   09/27/2014 -  Neo-Adjuvant Chemotherapy Neoadjuvant TCH Perjeta every 3 week 6 followed by Herceptin maintenance    CHIEF COMPLIANT: TCH Perjeta cycle 1/6  INTERVAL HISTORY: Natasha Torres is a 34 year old with above-mentioned history of left breast multifocal HER-2 positive breast cancer status post excisional biopsy and is here today for start of cycle 1 of neoadjuvant chemotherapy. She had extensive investigations done including breast MRI that showed an extensive area of abnormality extending up to the pectoralis muscle in addition to that they found additional nodules one in the right breast 1 cm and in the left breast is a satellite nodule in addition to the axillary lymph node. All 3 were biopsied and found to be benign. BRCA1 and 2 were negative. She even had a CT chest abdomen pelvis which was negative for cancer. Bone scan is pending.she started dexamethasone and could not sleep last night.  REVIEW OF SYSTEMS:   Constitutional: Denies fevers, chills or abnormal weight loss Eyes: Denies blurriness of vision Ears, nose, mouth, throat, and face: Denies mucositis or sore throat Respiratory: Denies cough, dyspnea or wheezes Cardiovascular: Denies palpitation, chest discomfort or lower extremity swelling Gastrointestinal:  Denies nausea, heartburn or change in bowel habits Skin: Denies abnormal skin rashes Lymphatics: Denies new lymphadenopathy or easy bruising Neurological:Denies numbness, tingling or new weaknesses Behavioral/Psych: Mood is stable, no new changes  All other systems were  reviewed with the patient and are negative.  I have reviewed the past medical history, past surgical history, social history and family history with the patient and they are unchanged from previous note.  ALLERGIES:  is allergic to penicillins.  MEDICATIONS:  Current Outpatient Prescriptions  Medication Sig Dispense Refill  . dexamethasone (DECADRON) 4 MG tablet Take 1 tablet (4 mg total) by mouth 2 (two) times daily. Start the day before Taxotere. Then again the day after chemo for 3 days. 30 tablet 1  . escitalopram (LEXAPRO) 10 MG tablet Take 10 mg by mouth daily.    [name] lidocaine-prilocaine (EMLA) cream Apply to affected area once 30 g 3  . LORazepam (ATIVAN) 0.5 MG tablet Take 1 tablet (0.5 mg total) by mouth every 6 (six) hours as needed (Nausea or vomiting). 30 tablet 0  . Melatonin 3 MG CAPS Take 1 capsule by mouth 3 (three) times a week.    . Multiple Vitamin (MULTIVITAMIN) tablet Take 1 tablet by mouth daily.    . ondansetron (ZOFRAN) 8 MG tablet Take 1 tablet (8 mg total) by mouth 2 (two) times daily. Start the day after chemo for 3 days. Then take as needed for nausea or vomiting. 30 tablet 1  . prochlorperazine (COMPAZINE) 10 MG tablet Take 1 tablet (10 mg total) by mouth every 6 (six) hours as needed (Nausea or vomiting). 30 tablet 1   No current facility-administered medications for this visit.   Facility-Administered Medications Ordered in Other Visits  Medication Dose Route Frequency Provider Last Rate Last Dose  . 0.9 %  sodium chloride infusion   Intravenous Once Nicholas Lose, MD      . CARBOplatin (PARAPLATIN) 750 mg in  sodium chloride 0.9 % 250 mL chemo infusion  750 mg Intravenous Once Nicholas Lose, MD      . dexamethasone (DECADRON) 10 mg in sodium chloride 0.9 % 50 mL IVPB   Intravenous Once Nicholas Lose, MD      . DOCEtaxel (TAXOTERE) 150 mg in dextrose 5 % 250 mL chemo infusion  75 mg/m2 (Treatment Plan Actual) Intravenous Once Nicholas Lose, MD      . goserelin  (ZOLADEX) injection 3.6 mg  3.6 mg Subcutaneous Once Nicholas Lose, MD      . heparin lock flush 100 unit/mL  500 Units Intracatheter Once PRN Nicholas Lose, MD      . palonosetron (ALOXI) injection 0.25 mg  0.25 mg Intravenous Once Nicholas Lose, MD      . pegfilgrastim (NEULASTA ONPRO KIT) injection 6 mg  6 mg Subcutaneous Once Nicholas Lose, MD      . pertuzumab (PERJETA) 840 mg in sodium chloride 0.9 % 250 mL chemo infusion  840 mg Intravenous Once Nicholas Lose, MD      . sodium chloride 0.9 % injection 10 mL  10 mL Intracatheter PRN Nicholas Lose, MD        PHYSICAL EXAMINATION: ECOG PERFORMANCE STATUS: 1 - Symptomatic but completely ambulatory  Filed Vitals:   09/27/14 0842  BP: 146/70  Pulse: 59  Temp: 98.2 F (36.8 C)  Resp: 18   Filed Weights   09/27/14 0842  Weight: 166 lb 12.8 oz (75.66 kg)    GENERAL:alert, no distress and comfortable SKIN: skin color, texture, turgor are normal, no rashes or significant lesions EYES: normal, Conjunctiva are pink and non-injected, sclera clear OROPHARYNX:no exudate, no erythema and lips, buccal mucosa, and tongue normal  NECK: supple, thyroid normal size, non-tender, without nodularity LYMPH:  no palpable lymphadenopathy in the cervical, axillary or inguinal LUNGS: clear to auscultation and percussion with normal breathing effort HEART: regular rate & rhythm and no murmurs and no lower extremity edema ABDOMEN:abdomen soft, non-tender and normal bowel sounds Musculoskeletal:no cyanosis of digits and no clubbing  NEURO: alert & oriented x 3 with fluent speech, no focal motor/sensory deficits BREAST:No palpable masses or nodules in either right or left breasts. No palpable axillary supraclavicular or infraclavicular adenopathy no breast tenderness or nipple discharge. (exam performed in the presence of a chaperone)  LABORATORY DATA:  I have reviewed the data as listed   Chemistry      Component Value Date/Time   NA 141 09/27/2014 0824    NA 135 11/29/2011 1615   K 3.7 09/27/2014 0824   K 4.6 11/29/2011 1615   CL 102 11/29/2011 1615   CO2 22 09/27/2014 0824   CO2 24 11/29/2011 1615   BUN 10.1 09/27/2014 0824   BUN 14 11/29/2011 1615   CREATININE 0.7 09/27/2014 0824   CREATININE 0.71 11/29/2011 1615      Component Value Date/Time   CALCIUM 9.1 09/27/2014 0824   CALCIUM 9.2 11/29/2011 1615   ALKPHOS 53 09/27/2014 0824   ALKPHOS 121* 11/29/2011 1615   AST 25 09/27/2014 0824   AST 27 11/29/2011 1615   ALT 14 09/27/2014 0824   ALT 24 11/29/2011 1615   BILITOT 0.47 09/27/2014 0824   BILITOT 0.1* 11/29/2011 1615       Lab Results  Component Value Date   WBC 8.1 09/27/2014   HGB 12.2 09/27/2014   HCT 36.7 09/27/2014   MCV 89.4 09/27/2014   PLT 167 09/27/2014   NEUTROABS 6.4 09/27/2014  RADIOGRAPHIC STUDIES: I have personally reviewed the radiology reports and agreed with their findings. CT chest abdomen pelvis does not show any metastatic disease Breast MRI 09/22/2014: Left breast enhancement extending to the anterior margin of the pectoralis muscle without involvement of left pectoralis muscle. Left breast 4.6 x 4.9 x 4.1 cm area of enhancement, additional and has been 0.9 cm and the right breast 1 cm lobulated mass.  Biopsy of the right breast mass and left axillary lymph node and the secondary mass left breast came back as benign.  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 Biopsy of the right breast mass and left axillary lymph node and the secondary mass left breast BACK is benign.  Treatment plan: Neoadjuvant  chemotherapy with [place] followed by mastectomy with reconstruction with adjuvant maintenance Herceptin, along with anti-estrogen therapy with tamoxifen 10 years Current treatment: Cycle 1 day 1 of [place] Perjeta  Chemotherapy monitoring: 1. Blood counts were reviewed that adequate for treatment 2. Echocardiogram was normal EF 55-60% on 09/23/2014 3. Port has been placed last Friday  Antiemetic regimen was discussed with her in great detail. She started on dexamethasone as of yesterday. Patient reports that she does not tolerate Zofran and gets constipation when she took it when she was pregnant. She will try taking Compazine first and if it does not help then she will take Zofran. Patient will get Aloxi before chemotherapy today. Return to clinic in 1 week for toxicity check.  No orders of the defined types were placed in this encounter.   The patient has a good understanding of the overall plan. she agrees with it. She will call with any problems that may develop before her next visit here.   Rulon Eisenmenger, MD

## 2014-09-27 NOTE — Patient Instructions (Signed)
[facility] Discharge Instructions for Patients Receiving Chemotherapy  Today you received the following chemotherapy agents Herceptin, Perjeta, Taxotere and Carboplatin.  To help prevent nausea and vomiting after your treatment, we encourage you to take your nausea medication as prescribed.    If you develop nausea and vomiting that is not controlled by your nausea medication, call the clinic.   BELOW ARE SYMPTOMS THAT SHOULD BE REPORTED IMMEDIATELY:  *FEVER GREATER THAN 100.5 F  *CHILLS WITH OR WITHOUT FEVER  NAUSEA AND VOMITING THAT IS NOT CONTROLLED WITH YOUR NAUSEA MEDICATION  *UNUSUAL SHORTNESS OF BREATH  *UNUSUAL BRUISING OR BLEEDING  TENDERNESS IN MOUTH AND THROAT WITH OR WITHOUT PRESENCE OF ULCERS  *URINARY PROBLEMS  *BOWEL PROBLEMS  UNUSUAL RASH Items with * indicate a potential emergency and should be followed up as soon as possible.  Feel free to call the clinic you have any questions or concerns. The clinic phone number is [phone].  Please show the [place] at check-in to the Emergency Department and triage nurse.

## 2014-09-29 ENCOUNTER — Encounter (HOSPITAL_COMMUNITY): Payer: Managed Care, Other (non HMO)

## 2014-09-29 ENCOUNTER — Telehealth: Payer: Self-pay | Admitting: *Deleted

## 2014-09-29 NOTE — Telephone Encounter (Signed)
Reschedule pt bone scan fro 4/18 at 9:45. Confirmed appt date and time. Pt complain of sore throat. No fever or sores noted. Discussed salt water gargle and to monitor for fever and sores. Received verbal understanding.

## 2014-09-30 ENCOUNTER — Other Ambulatory Visit: Payer: Self-pay | Admitting: *Deleted

## 2014-09-30 MED ORDER — UNABLE TO FIND: 1.0000 | Freq: Every day | Status: DC

## 2014-10-01 ENCOUNTER — Telehealth: Payer: Self-pay | Admitting: *Deleted

## 2014-10-01 ENCOUNTER — Other Ambulatory Visit: Payer: Self-pay | Admitting: *Deleted

## 2014-10-01 MED ORDER — MAGIC MOUTHWASH W/LIDOCAINE: 5.0000 mL | Freq: Four times a day (QID) | ORAL | Status: DC | PRN

## 2014-10-01 NOTE — Telephone Encounter (Signed)
MOUTH IS RED WITH SMALL BUMPS SINCE 09/27/14. NO WHITE PATCHES. PT. IS FORCING FLUIDS BUT IT BECOMING DIFFICULTY TO EAT. WOULD LIKE SOMETHING CALLED TO [facility] AT [place]. THIS NOTE ROUTED TO CYNDEE BACON,NP.

## 2014-10-01 NOTE — Telephone Encounter (Signed)
VERBAL ORDER AND READ BACK TO CYNDEE BACON,NP-PT. MAY ALSO DO 1/2 TEASPOON BAKING SODA AND 1/2 TEASPOON SALT IN WARM WATER SWISH AND SPIT EVERY HOUR PRN. NOTIFIED PT. OF NEW ORDERS. SHE VOICES UNDERSTANDING.

## 2014-10-04 ENCOUNTER — Ambulatory Visit: Payer: Managed Care, Other (non HMO)

## 2014-10-04 ENCOUNTER — Other Ambulatory Visit: Payer: Self-pay

## 2014-10-04 ENCOUNTER — Telehealth: Payer: Self-pay | Admitting: Hematology and Oncology

## 2014-10-04 ENCOUNTER — Ambulatory Visit (HOSPITAL_BASED_OUTPATIENT_CLINIC_OR_DEPARTMENT_OTHER): Payer: Managed Care, Other (non HMO) | Admitting: Hematology and Oncology

## 2014-10-04 ENCOUNTER — Telehealth: Payer: Self-pay

## 2014-10-04 ENCOUNTER — Encounter (HOSPITAL_COMMUNITY): Payer: Managed Care, Other (non HMO)

## 2014-10-04 ENCOUNTER — Ambulatory Visit (HOSPITAL_COMMUNITY)
Admission: RE | Admit: 2014-10-04 | Discharge: 2014-10-04 | Disposition: A | Discharge status: Home / Self Care | Payer: Managed Care, Other (non HMO) | Source: Ambulatory Visit | Attending: Hematology and Oncology | Admitting: Hematology and Oncology

## 2014-10-04 ENCOUNTER — Other Ambulatory Visit (HOSPITAL_BASED_OUTPATIENT_CLINIC_OR_DEPARTMENT_OTHER): Payer: Managed Care, Other (non HMO)

## 2014-10-04 ENCOUNTER — Telehealth: Payer: Self-pay | Admitting: Genetic Counselor

## 2014-10-04 VITALS — BP 135/66 | HR 78 | Temp 97.9°F | Resp 18 | Ht 71.0 in | Wt 169.4 lb

## 2014-10-04 DIAGNOSIS — R21 Rash and other nonspecific skin eruption: Secondary | ICD-10-CM

## 2014-10-04 DIAGNOSIS — C50412 Malignant neoplasm of upper-outer quadrant of left female breast: Secondary | ICD-10-CM | POA: Diagnosis present

## 2014-10-04 DIAGNOSIS — R109 Unspecified abdominal pain: Secondary | ICD-10-CM

## 2014-10-04 DIAGNOSIS — Z95828 Presence of other vascular implants and grafts: Secondary | ICD-10-CM

## 2014-10-04 DIAGNOSIS — R197 Diarrhea, unspecified: Secondary | ICD-10-CM | POA: Diagnosis not present

## 2014-10-04 DIAGNOSIS — R5383 Other fatigue: Secondary | ICD-10-CM

## 2014-10-04 LAB — CBC WITH DIFFERENTIAL/PLATELET
BASO%: 0.7 % (ref 0.0–2.0)
Basophils Absolute: 0 10*3/uL (ref 0.0–0.1)
EOS%: 0.5 % (ref 0.0–7.0)
Eosinophils Absolute: 0 10*3/uL (ref 0.0–0.5)
HCT: 34.1 % — ABNORMAL LOW (ref 34.8–46.6)
HGB: 11.2 g/dL — ABNORMAL LOW (ref 11.6–15.9)
LYMPH%: 35.5 % (ref 14.0–49.7)
MCH: 29.2 pg (ref 25.1–34.0)
MCHC: 32.7 g/dL (ref 31.5–36.0)
MCV: 89.3 fL (ref 79.5–101.0)
MONO#: 0 10*3/uL — ABNORMAL LOW (ref 0.1–0.9)
MONO%: 0.1 % (ref 0.0–14.0)
NEUT%: 63.2 % (ref 38.4–76.8)
NEUTROS ABS: 2.3 10*3/uL (ref 1.5–6.5)
PLATELETS: 159 10*3/uL (ref 145–400)
RBC: 3.82 10*6/uL (ref 3.70–5.45)
RDW: 12.5 % (ref 11.2–14.5)
WBC: 3.6 10*3/uL — ABNORMAL LOW (ref 3.9–10.3)
lymph#: 1.3 10*3/uL (ref 0.9–3.3)

## 2014-10-04 LAB — COMPREHENSIVE METABOLIC PANEL (CC13)
ALT: 26 U/L (ref 0–55)
ANION GAP: 11 meq/L (ref 3–11)
AST: 27 U/L (ref 5–34)
Albumin: 3.6 g/dL (ref 3.5–5.0)
Alkaline Phosphatase: 75 U/L (ref 40–150)
BUN: 9.5 mg/dL (ref 7.0–26.0)
CHLORIDE: 104 meq/L (ref 98–109)
CO2: 22 meq/L (ref 22–29)
Calcium: 8.7 mg/dL (ref 8.4–10.4)
Creatinine: 0.7 mg/dL (ref 0.6–1.1)
EGFR: >90 mL/min/{1.73_m2} (ref >90)
Glucose: 95 mg/dl (ref 70–140)
Potassium: 3.9 mEq/L (ref 3.5–5.1)
Sodium: 136 mEq/L (ref 136–145)
TOTAL PROTEIN: 6.6 g/dL (ref 6.4–8.3)
Total Bilirubin: <0.2 mg/dL (ref 0.20–1.20)

## 2014-10-04 MED ORDER — SODIUM CHLORIDE 0.9 % IJ SOLN
10.0000 mL | INTRAMUSCULAR | Status: DC | PRN
  Administered 2014-10-04: 10 mL via INTRAVENOUS
  Filled 2014-10-04: qty 10

## 2014-10-04 MED ORDER — HEPARIN SOD (PORK) LOCK FLUSH 100 UNIT/ML IV SOLN
500.0000 [IU] | Freq: Once | INTRAVENOUS | Status: AC
  Administered 2014-10-04: 500 [IU] via INTRAVENOUS
  Filled 2014-10-04: qty 5

## 2014-10-04 MED ORDER — TECHNETIUM TC 99M MEDRONATE IV KIT
25.0000 | PACK | Freq: Once | INTRAVENOUS | Status: AC | PRN
  Administered 2014-10-04: 25 via INTRAVENOUS

## 2014-10-04 MED ORDER — CLINDAMYCIN PHOSPHATE 1 % EX GEL: Freq: Two times a day (BID) | CUTANEOUS | Status: DC

## 2014-10-04 NOTE — Patient Instructions (Signed)

## 2014-10-04 NOTE — Assessment & Plan Note (Signed)
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 Biopsy of the right breast mass and left axillary lymph node and the secondary mass left breast back as benign.  Treatment plan: Neoadjuvant chemotherapy with [place] followed by mastectomy with reconstruction with adjuvant maintenance Herceptin, along with anti-estrogen therapy with tamoxifen 10 years Current treatment: Cycle 1 day 8 of [place] Perjeta  Chemotherapy monitoring: 1. Blood counts were reviewed 2. Echocardiogram was normal EF 55-60% on 09/23/2014  Return to clinic in 2 weeks for cycle 2

## 2014-10-04 NOTE — Telephone Encounter (Signed)
-----   Message from Hebert Soho, RN sent at 09/27/2014  6:27 PM EDT ----- Regarding: 1st time chemo f/up call First time Herceptin/Perjeta/Taxotere/Carboplatin. No reaction. Dr. Lindi Adie. Please call patient late morning on her cell phone.

## 2014-10-04 NOTE — Telephone Encounter (Signed)
per pof ot sch pt appt-gave pt copy of sch °

## 2014-10-04 NOTE — Progress Notes (Signed)
Patient Care Team: Loraine Leriche, MD as PCP - General (Internal Medicine)  DIAGNOSIS: No matching staging information was found for the patient.  SUMMARY OF ONCOLOGIC HISTORY:   Breast cancer of upper-outer quadrant of left female breast   08/27/2014 Initial Diagnosis Excisional biopsy: 2 lumps showing 1.5 cm and 0.9 cm IDC, Grade 3, ER Pos, PR Neg and HER-2 amplified Ratio 2.6, Multifocal   09/27/2014 -  Neo-Adjuvant Chemotherapy Neoadjuvant TCH Perjeta every 3 week 6 followed by Herceptin maintenance    CHIEF COMPLIANT: Cycle 1 day toxicity check TCH Perjeta  INTERVAL HISTORY: Natasha Torres is a 34 year old with above-mentioned history of breast cancer currently on neoadjuvant chemotherapy. She has tolerated cycle 1 fairly well. On day 4 she developed diarrhea and abdominal pain. Primarily she feels that she is gassy in her abdomen. Denied any nausea or vomiting. She had active on rash on her face and chest wall. She had a bone scan today which was normal.  REVIEW OF SYSTEMS:   Constitutional: Denies fevers, chills or abnormal weight loss Eyes: Denies blurriness of vision Ears, nose, mouth, throat, and face: Denies mucositis or sore throat Respiratory: Denies cough, dyspnea or wheezes Cardiovascular: Denies palpitation, chest discomfort or lower extremity swelling Gastrointestinal:  Denies nausea, heartburn or change in bowel habits Skin: Rash on the chest wall and acne on the face. Lymphatics: Denies new lymphadenopathy or easy bruising Neurological:Denies numbness, tingling or new weaknesses Behavioral/Psych: Mood is stable, no new changes   All other systems were reviewed with the patient and are negative.  I have reviewed the past medical history, past surgical history, social history and family history with the patient and they are unchanged from previous note.  ALLERGIES:  is allergic to penicillins.  MEDICATIONS:  Current Outpatient Prescriptions  Medication Sig  Dispense Refill  . Alum & Mag Hydroxide-Simeth (MAGIC MOUTHWASH W/LIDOCAINE) SOLN Take 5 mLs by mouth 4 (four) times daily as needed for mouth pain (DUKE'S MAGIC MOUTHWASH WITH NYSTATIN AND LIDOCAINE). 240 mL 0  . clindamycin (CLINDAGEL) 1 % gel Apply topically 2 (two) times daily. 30 g 0  . dexamethasone (DECADRON) 4 MG tablet Take 1 tablet (4 mg total) by mouth 2 (two) times daily. Start the day before Taxotere. Then again the day after chemo for 3 days. 30 tablet 1  . escitalopram (LEXAPRO) 10 MG tablet Take 10 mg by mouth daily.    [name] HYDROcodone-acetaminophen (NORCO/VICODIN) 5-325 MG per tablet     . lidocaine-prilocaine (EMLA) cream Apply to affected area once 30 g 3  . LORazepam (ATIVAN) 0.5 MG tablet Take 1 tablet (0.5 mg total) by mouth every 6 (six) hours as needed (Nausea or vomiting). 30 tablet 0  . Melatonin 3 MG CAPS Take 1 capsule by mouth 3 (three) times a week.    . Multiple Vitamin (MULTIVITAMIN) tablet Take 1 tablet by mouth daily.    . ondansetron (ZOFRAN) 8 MG tablet Take 1 tablet (8 mg total) by mouth 2 (two) times daily. Start the day after chemo for 3 days. Then take as needed for nausea or vomiting. 30 tablet 1  . prochlorperazine (COMPAZINE) 10 MG tablet Take 1 tablet (10 mg total) by mouth every 6 (six) hours as needed (Nausea or vomiting). 30 tablet 1  . UNABLE TO FIND 1 each by Other route daily. 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  Facility-Administered Medications Ordered in Other Visits  Medication Dose Route Frequency Provider Last Rate Last Dose  . sodium chloride 0.9 % injection 10 mL  10 mL Intravenous PRN Nicholas Lose, MD   10 mL at 10/04/14 1453    PHYSICAL EXAMINATION: ECOG PERFORMANCE STATUS: 1 - Symptomatic but completely ambulatory  Filed Vitals:   10/04/14 1531  BP: 135/66  Pulse: 78  Temp: 97.9 F  (36.6 C)  Resp: 18   Filed Weights   10/04/14 1531  Weight: 169 lb 6.4 oz (76.839 kg)    GENERAL:alert, no distress and comfortable SKIN: skin color, texture, turgor are normal, no rashes or significant lesions EYES: normal, Conjunctiva are pink and non-injected, sclera clear OROPHARYNX:no exudate, no erythema and lips, buccal mucosa, and tongue normal  NECK: supple, thyroid normal size, non-tender, without nodularity LYMPH:  no palpable lymphadenopathy in the cervical, axillary or inguinal LUNGS: clear to auscultation and percussion with normal breathing effort HEART: regular rate & rhythm and no murmurs and no lower extremity edema ABDOMEN:abdomen soft, non-tender and normal bowel sounds Musculoskeletal:no cyanosis of digits and no clubbing  NEURO: alert & oriented x 3 with fluent speech, no focal motor/sensory deficits  LABORATORY DATA:  I have reviewed the data as listed   Chemistry      Component Value Date/Time   NA 136 10/04/2014 1434   NA 135 11/29/2011 1615   K 3.9 10/04/2014 1434   K 4.6 11/29/2011 1615   CL 102 11/29/2011 1615   CO2 22 10/04/2014 1434   CO2 24 11/29/2011 1615   BUN 9.5 10/04/2014 1434   BUN 14 11/29/2011 1615   CREATININE 0.7 10/04/2014 1434   CREATININE 0.71 11/29/2011 1615      Component Value Date/Time   CALCIUM 8.7 10/04/2014 1434   CALCIUM 9.2 11/29/2011 1615   ALKPHOS 75 10/04/2014 1434   ALKPHOS 121* 11/29/2011 1615   AST 27 10/04/2014 1434   AST 27 11/29/2011 1615   ALT 26 10/04/2014 1434   ALT 24 11/29/2011 1615   BILITOT <0.20 10/04/2014 1434   BILITOT 0.1* 11/29/2011 1615       Lab Results  Component Value Date   WBC 3.6* 10/04/2014   HGB 11.2* 10/04/2014   HCT 34.1* 10/04/2014   MCV 89.3 10/04/2014   PLT 159 10/04/2014   NEUTROABS 2.3 10/04/2014     RADIOGRAPHIC STUDIES: I have personally reviewed the radiology reports and agreed with their findings. Nm Bone Scan Whole Body  10/04/2014   CLINICAL DATA:  Breast  cancer.  EXAM: NUCLEAR MEDICINE WHOLE BODY BONE SCAN  TECHNIQUE: Whole body anterior and posterior images were obtained approximately 3 hours after intravenous injection of radiopharmaceutical.  RADIOPHARMACEUTICALS:  25.0 mCi Technetium-99 MDP  COMPARISON:  CT 09/24/2014.  FINDINGS: Bilateral renal function excretion. No focal bony abnormality. No evidence of metastatic disease.  IMPRESSION: No focal abnormality.  No evidence of metastatic disease.   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 Biopsy of the right breast mass and left axillary lymph node and the  secondary mass left breast back as benign.  Treatment plan: Neoadjuvant chemotherapy with [place] followed by mastectomy with reconstruction with adjuvant maintenance Herceptin, along with anti-estrogen therapy with tamoxifen 10 years Current treatment: Cycle 1 day 8 of TCH Perjeta  Toxicities: 1. Diarrhea and abdominal pain related to Perjeta improved with Imodium, I also recommended that she take Zantac for the stomach pain. 2. Fatigue day 5-7 3. Acneiform rash on the face and chest wall I prescribed clindamycin topical ointment  Chemotherapy monitoring: 1. Blood counts were reviewed 2. Echocardiogram was normal EF 55-60% on 09/23/2014  Return to clinic in 2 weeks for cycle 2  No orders of the defined types were placed in this encounter.   The patient has a good understanding of the overall plan. she agrees with it. She will call with any problems that may develop before her  next visit here.   Rulon Eisenmenger, MD

## 2014-10-04 NOTE — Telephone Encounter (Signed)
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  She will come in to discuss the results on Wednesday at 3 PM.  I encouraged her NOT to search the internet prior to appointment.

## 2014-10-04 NOTE — Telephone Encounter (Signed)
1st time chemo follow up call not made.  Unaware desk nurse responsibility.  Pt was seen in clinic today for nadir check.

## 2014-10-05 ENCOUNTER — Encounter: Payer: Self-pay | Admitting: *Deleted

## 2014-10-05 ENCOUNTER — Telehealth: Payer: Self-pay | Admitting: Hematology and Oncology

## 2014-10-05 NOTE — Telephone Encounter (Signed)
Spoke with patient and she is aware of her NUT appointment

## 2014-10-05 NOTE — Progress Notes (Signed)
Spoke to pt concerning her chemotherapy regimen and timing. Relate she has some nutritional concerns while on chemotherapy. Referral made for her to see then nutritionist.

## 2014-10-05 NOTE — Progress Notes (Signed)
Pt called and request last office note and a note for reasoning for alopecia to be sent to her insurance company. Faxed office note and letter to [facility] [phone]

## 2014-10-06 ENCOUNTER — Encounter: Payer: Self-pay | Admitting: Genetic Counselor

## 2014-10-06 ENCOUNTER — Ambulatory Visit (HOSPITAL_BASED_OUTPATIENT_CLINIC_OR_DEPARTMENT_OTHER): Payer: Managed Care, Other (non HMO) | Admitting: Genetic Counselor

## 2014-10-06 ENCOUNTER — Encounter (HOSPITAL_COMMUNITY): Payer: Self-pay

## 2014-10-06 ENCOUNTER — Ambulatory Visit (HOSPITAL_COMMUNITY): Payer: Managed Care, Other (non HMO)

## 2014-10-06 DIAGNOSIS — Z8051 Family history of malignant neoplasm of kidney: Secondary | ICD-10-CM

## 2014-10-06 DIAGNOSIS — C50412 Malignant neoplasm of upper-outer quadrant of left female breast: Secondary | ICD-10-CM

## 2014-10-06 DIAGNOSIS — Z803 Family history of malignant neoplasm of breast: Secondary | ICD-10-CM

## 2014-10-06 DIAGNOSIS — Z808 Family history of malignant neoplasm of other organs or systems: Secondary | ICD-10-CM

## 2014-10-06 DIAGNOSIS — Z8042 Family history of malignant neoplasm of prostate: Secondary | ICD-10-CM

## 2014-10-06 DIAGNOSIS — Z801 Family history of malignant neoplasm of trachea, bronchus and lung: Secondary | ICD-10-CM

## 2014-10-06 DIAGNOSIS — C50919 Malignant neoplasm of unspecified site of unspecified female breast: Secondary | ICD-10-CM

## 2014-10-06 DIAGNOSIS — Z315 Encounter for genetic counseling: Secondary | ICD-10-CM | POA: Diagnosis not present

## 2014-10-06 NOTE — Progress Notes (Addendum)
REFERRING PROVIDER: Gretchen Y. Velazquez, MD [address] [facility] [place], [place] [address]   Natasha Gudena, MD  PRIMARY PROVIDER:  VELAZQUEZ,GRETCHEN, MD  PRIMARY REASON FOR VISIT:  1. Family history of prostate cancer   2. Family history of breast cancer in female   3. Breast cancer of upper-outer quadrant of left female breast   4. 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 female, was seen to discuss her genetic test results which found an ATM mutation through [facility].  Natasha Torres presents to clinic today to discuss the possibility of a hereditary predisposition to cancer, genetic testing, and to further clarify her future cancer risks, as well as potential cancer risks for family members.   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 She underwent genetic testing through the [facility] gene panel and was found to carry an ATM mutation called [id number].  This is a splice site mutation, located one nucleotide from exon 36.  THis was the first time [facility] had seen this particular mutation and was calling it pathogenic based off of ACMG criteria.  Calling around to [facility], [facility] and [facility], only [facility] had seen this one other time and they call it a likely pathogenic mutation as well.     CANCER HISTORY:    Breast cancer of upper-outer quadrant of left female breast   08/27/2014 Initial Diagnosis Excisional biopsy: 2 lumps showing 1.5 cm and 0.9 cm IDC, Grade 3, ER Pos, PR Neg and HER-2 amplified Ratio 2.6, Multifocal   09/27/2014 -  Neo-Adjuvant Chemotherapy Neoadjuvant TCH Perjeta every 3 week 6 followed by Herceptin maintenance     Past Medical History  Diagnosis Date  . No pertinent past medical history   . H/O oophorectomy   . History of wisdom tooth extraction   . Hx of hand surgery   . SVD (spontaneous vaginal  delivery) 12/03/2011  . 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 Right oophorectomy    . Wisdom tooth extraction    . Hand surgery       R thumb    History   Social History  . Marital Status: Married    Spouse Name: Christopher  . Number of Children: 1  . Years of Education: N/A   Social History Main Topics  . Smoking status: Former Smoker -- 0.00 packs/day    Types: Cigarettes    Quit date: 09/17/1994  . Smokeless tobacco: Not on file  . Alcohol Use: Yes     Comment: 7-8 per week  . Drug Use: No  . Sexual Activity: Yes   Other Topics Concern  . None   Social History Narrative   ** Merged History Encounter **         FAMILY HISTORY:  We obtained a detailed, 4-generation family history.  Significant diagnoses are listed below: Family History  Problem Relation Age of Onset  . Hypertension Mother   . Depression Maternal Aunt   . Cancer Maternal Grandfather   . Cancer Paternal Grandfather   . Prostate cancer Father 60  . Dementia Maternal Grandmother   . Heart attack Maternal Grandfather   . Dementia Paternal Grandmother   . Kidney cancer Paternal   Grandmother     slow growing, no treatment  . Prostate cancer Paternal Grandfather 49  . Bone cancer Paternal Grandfather 5  . Breast cancer Paternal Grandfather 72  . Lung cancer Paternal Grandfather     dx late 34s; smoker.  thought to be a 4th primary cancer  . Lung cancer Other     smoker  . Prostate cancer Other     MGMs 1/2 brother   Natasha Torres has a full brother who is healthy and a maternal half brother who passed away at age 12 from homicide. Her father was diagnosed with prostate cancer at 31 and her mother is healthy with no cancer. Her father has one sister and two brothers, none of whom have cancer. Her paternal grandfather had four primary cancers - prostate cancer at 34, bone cancer at 26,  breast cancer at 64 and lung cancer (smoker) in his late 49s. Natasha Torres paternal grandmother had kidney cancer and died of dementia at 58. Her sister had lung cancer. Natasha Torres maternal grandmother's half brother was diagnosed with prostate cancer in his 60s. Patient's maternal ancestors are of [place] and [place] descent, and paternal ancestors are of [place] descent. There is no reported Ashkenazi Jewish ancestry. There is no known consanguinity.  GENETIC COUNSELING ASSESSMENT: Natasha Torres is a 34 y.o. female with a personal history of breast cancer and a known mutation in ATM. We, therefore, discussed and recommended the following at today's visit.   DISCUSSION: The ATM gene is involved in the detection and surveillance of DNA damage.  ATM phosphorylation of BRCA1 is critical for proper response to DNA double-strand breaks.  This is believed to be the reason for the role ATM has in breast cancer risk.  Homozygous ATM mutation carriers (individuals with 2 ATM gene mutations) have a condition called ataxia-telangiectasia (AT).  AT is characterized by progressive cerebellar degeneration (ataxia), dilated blood vessels in the eyes and skin (telangiectasia), immunodeficiency, chromosomal instability, increased sensitivity to ionizing radiation and a predisposition to lymphoma and leukemia.  Studies of these families demonstrated increased incident of breast cancer in the mothers (who are heterozygous carriers) of the affected children, thus prompting further evaluation of the relationship between breast cancer and ATM.  Women who are heterozygous ATM carriers have an increase breast cancer risk.  They have 5-fold increased risk of breast cancer <50 years, and 2-3 fold increased risk for breast cancer overall.  In families with familial breast cancer that were negative for BRCA1 or BRCA2 genes, approximately 2.7% of women were found to have one ATM mutation.  In families with both  breast cancer and leukemia, 6.7% of women were found to have one ATM mutation.  Other cancers have been seen in individuals with ATM mutations including pancreatic, stomach, leukemia, prostate and melanoma.  There are no specific recommendations for individuals to undergo specific screening for any of these cancers, as there is not a quanitifiable risk.  There has been some evidence that radiation treatment may increase the risk for breast cancer in the contralateral breast. Despite this risk, we do not recommend declining radiation treatment for her breast cancer if it is recommended, as the risk for having a recurrence of breast cancer based on not going through radiation may be greater than her risk for getting breast cancer again from the radiation.   Other family members are at risk for carrying this mutation.  Specifically Ms. Tozer's brother and daughter are at 50% risk.  It appears that  this mutation is coming from her father's side of the family.  Her father could get tested to confirm his carrier status.  Ms. Daponte has one female cousin on her paternal side of the family who is at approximately 25% risk, assuming that it is coming from her father.  We discussed the ATM mutation in light of her family history.  Mutations in genes other than BRCA, including PALB2, CHEK2 and ATM have been implicated in female breast cancer.  Additionally, Ms. Waguespack's lifetime risk for breast cancer prior to receiving her diagnosis, was up to 52%.  Despite this risk, being diagnosed with breast cancer, with an ATM mutation, at the age of 33 is still young.  Therefore, we would recommend that her daughter get tested for the ATM mutation so that she can start her breast cancer screening 10 years younger than Ms. Ryant's age of onset, at approximately age 23.  According to the NCCN 2.2016 guidelines, MRI would be appropriate for screening. These are updated at least annually, and therefore when her daughter is  of age to get tested, she will want to pursue genetic counseling to learn the most updated screening guidelines for ATM mutations.    PLAN: Ms. Tremont will talk with her family members about undergoing genetic testing.    Lastly, we encouraged Ms. Fusaro to remain in contact with cancer genetics annually so that we can continuously update the family history and inform her of any changes in cancer genetics and testing that may be of benefit for this family.   Ms.  Glahn's questions were answered to her satisfaction today. Our contact information was provided should additional questions or concerns arise. Thank you for the referral and allowing us to share in the care of your patient.   Karen P. Powell, MS, CGC Certified Genetic Counselor [email or web address] phone: [phone]  The patient was seen for a total of 30 minutes in face-to-face genetic counseling.  This patient was discussed with Drs. Magrinat, Torres and/or Feng who agrees with the above.    _______________________________________________________________________ For Office Staff:  Number of people involved in session: 2 Was an Intern/ student involved with case: no    

## 2014-10-08 ENCOUNTER — Encounter: Payer: Self-pay | Admitting: Hematology and Oncology

## 2014-10-08 NOTE — Progress Notes (Signed)
I placed [employer] forms on the desk of nurse for dr. Lindi Adie.

## 2014-10-11 ENCOUNTER — Ambulatory Visit: Payer: Managed Care, Other (non HMO) | Admitting: Nutrition

## 2014-10-11 NOTE — Progress Notes (Signed)
34 year old female diagnosed with ER +, PR positive breast cancer.  She is a patient of Dr. Lindi Adie.  Past medical history is not pertinent.  Medications include Magic mouthwash, Decadron, Ativan, multivitamin, Zofran, and Compazine.  Labs were reviewed.  Height: 5 feet 11 inches. Weight: 169.4 pounds. Usual body weight: 155 pounds. BMI: 23.64.  Patient reports diarrhea and severe stomach cramping after first week of treatment. She had mild nausea which was resolved with medication. Food tastes bland. She complains of mouth soreness.   Patient has a lot of questions regarding nutrition.  Nutrition diagnosis:  Food and nutrition related knowledge deficit related to diagnosis of breast cancer as evidenced by no prior need for nutrition related information.  Intervention: Patient educated to consume healthy plant-based diet which is low in fat and strive to maintain a healthy body weight. Educated patient on strategies for eating if she has diarrhea and stomach cramping. Encouraged compliance with nausea medications. Educated patient on ways to improve taste of food and improve mouth sores. Questions were answered on nutrition and fact sheets were provided. Teach back method was used.  Monitoring, evaluation, goals: Patient will tolerate healthy plant-based diet to promote healthy body weight.  Next visit: Patient will contact me for any questions.  **Disclaimer: This note was dictated with voice recognition software. Similar sounding words can inadvertently be transcribed and this note may contain transcription errors which may not have been corrected upon publication of note.**

## 2014-10-13 ENCOUNTER — Encounter: Payer: Self-pay | Admitting: Hematology and Oncology

## 2014-10-13 NOTE — Progress Notes (Signed)
I fa

## 2014-10-13 NOTE — Progress Notes (Signed)
I called and spoke with patient and she will come and pick up on Friday at front desk with ms wilma.

## 2014-10-14 ENCOUNTER — Telehealth: Payer: Self-pay | Admitting: *Deleted

## 2014-10-14 NOTE — Telephone Encounter (Signed)
Patient called and stated,"my throat is scratchy. It's not red and I don't see any white patches. What can I take for this?" Patient denied fever, nausea, vomiting, diarrhea. She is eating and drinking fine. Instructed patient to gargle with warm salt water, use Chloraseptic spray, cough drops and monitor temperature. Patient to call back tomorrow, and inform Cr[place]all how she is doing before her chemotherapy on Monday. Patient verbalized understanding.

## 2014-10-15 ENCOUNTER — Other Ambulatory Visit: Payer: Self-pay | Admitting: Nurse Practitioner

## 2014-10-15 ENCOUNTER — Ambulatory Visit (HOSPITAL_BASED_OUTPATIENT_CLINIC_OR_DEPARTMENT_OTHER): Payer: Managed Care, Other (non HMO) | Admitting: Nurse Practitioner

## 2014-10-15 ENCOUNTER — Encounter: Payer: Self-pay | Admitting: *Deleted

## 2014-10-15 ENCOUNTER — Ambulatory Visit (HOSPITAL_BASED_OUTPATIENT_CLINIC_OR_DEPARTMENT_OTHER): Payer: Managed Care, Other (non HMO)

## 2014-10-15 ENCOUNTER — Encounter: Payer: Self-pay | Admitting: Nurse Practitioner

## 2014-10-15 ENCOUNTER — Telehealth: Payer: Self-pay | Admitting: *Deleted

## 2014-10-15 VITALS — BP 117/56 | HR 84 | Temp 99.1°F | Resp 18 | Wt 170.1 lb

## 2014-10-15 DIAGNOSIS — C50412 Malignant neoplasm of upper-outer quadrant of left female breast: Secondary | ICD-10-CM | POA: Diagnosis not present

## 2014-10-15 DIAGNOSIS — J029 Acute pharyngitis, unspecified: Secondary | ICD-10-CM | POA: Insufficient documentation

## 2014-10-15 DIAGNOSIS — K5900 Constipation, unspecified: Secondary | ICD-10-CM | POA: Diagnosis not present

## 2014-10-15 LAB — COMPREHENSIVE METABOLIC PANEL (CC13)
ALBUMIN: 3.8 g/dL (ref 3.5–5.0)
ALT: 25 U/L (ref 0–55)
ANION GAP: 11 meq/L (ref 3–11)
AST: 26 U/L (ref 5–34)
Alkaline Phosphatase: 75 U/L (ref 40–150)
BILIRUBIN TOTAL: 0.38 mg/dL (ref 0.20–1.20)
BUN: 16.5 mg/dL (ref 7.0–26.0)
CALCIUM: 8.9 mg/dL (ref 8.4–10.4)
CO2: 23 meq/L (ref 22–29)
CREATININE: 0.7 mg/dL (ref 0.6–1.1)
Chloride: 105 mEq/L (ref 98–109)
EGFR: >90 mL/min/{1.73_m2} (ref >90)
Glucose: 111 mg/dl (ref 70–140)
POTASSIUM: 4.2 meq/L (ref 3.5–5.1)
SODIUM: 139 meq/L (ref 136–145)
Total Protein: 7 g/dL (ref 6.4–8.3)

## 2014-10-15 LAB — CBC WITH DIFFERENTIAL/PLATELET
BASO%: 0.3 % (ref 0.0–2.0)
Basophils Absolute: 0 10*3/uL (ref 0.0–0.1)
EOS%: 0 % (ref 0.0–7.0)
Eosinophils Absolute: 0 10*3/uL (ref 0.0–0.5)
HCT: 34.6 % — ABNORMAL LOW (ref 34.8–46.6)
HEMOGLOBIN: 11.4 g/dL — AB (ref 11.6–15.9)
LYMPH%: 6.1 % — ABNORMAL LOW (ref 14.0–49.7)
MCH: 30.2 pg (ref 25.1–34.0)
MCHC: 32.9 g/dL (ref 31.5–36.0)
MCV: 91.5 fL (ref 79.5–101.0)
MONO#: 0.8 10*3/uL (ref 0.1–0.9)
MONO%: 5 % (ref 0.0–14.0)
NEUT#: 13.2 10*3/uL — ABNORMAL HIGH (ref 1.5–6.5)
NEUT%: 88.6 % — ABNORMAL HIGH (ref 38.4–76.8)
PLATELETS: 127 10*3/uL — AB (ref 145–400)
RBC: 3.78 10*6/uL (ref 3.70–5.45)
RDW: 13.3 % (ref 11.2–14.5)
WBC: 14.9 10*3/uL — ABNORMAL HIGH (ref 3.9–10.3)
lymph#: 0.9 10*3/uL (ref 0.9–3.3)
nRBC: 0 % (ref 0–0)

## 2014-10-15 MED ORDER — AZITHROMYCIN 250 MG PO TABS: ORAL_TABLET | ORAL | Status: DC

## 2014-10-15 NOTE — Assessment & Plan Note (Signed)
Patient reports sore throat, nonproductive cough, and low-grade fever of maximum 99.1 for the past few days.  She also reports mild headache and some occasional nausea as well.  She states that both her husband and her child also have had the same symptoms.  On exam-no obvious URI symptoms.  Posterior oropharynx with erythema; but no exudate.  Patient appears to be managing all oral secretions with no difficulty.  Bilateral breath sounds clear; with no cough or wheeze.  No obvious shortness of breath on exam.  Obtained a strep swab; and rapid strep screen is pending results.  Patient appears nontoxic.  However, will prescribe Zithromax (pt is allergic to PCN) for treatment of possible strep throat; and pharyngitis.  Patient was advised to call/return to go directly to the emergency department over the weekend if she develops any worsening symptoms whatsoever.

## 2014-10-15 NOTE — Assessment & Plan Note (Signed)
Patient states that she has been slightly constipated for the past several days; and has occasionally been straining to have a bowel movement.  She noticed one episode of bright red blood when wiping after a bowel movement.  Patient was encouraged to try the bowel regimen with stool softeners and Corrin Parker on an as-needed basis to keep her bowel movements regular.  Patient was also encouraged to call/return or go directly to the emergency department for any worsening rectal bleeding issues whatsoever.

## 2014-10-15 NOTE — Telephone Encounter (Signed)
TC to Natasha Torres- Strep was negative. Cyndee advised Natasha Torres to begin abx that was prescribed and take as directed. Natasha Torres reports she already has taken one dose and has marked improvement. Natasha Torres understands to call us with any further concerns or questions.

## 2014-10-15 NOTE — Progress Notes (Signed)
SYMPTOM MANAGEMENT CLINIC   HPI: Natasha Torres 34 y.o. female diagnosed with breast cancer.  Currently undergoing neoadjuvant Taxotere/carboplatin/Herceptin/Perjeta chemotherapy regimen.  Patient is complaining of 2-3 day history of sore throat, nonproductive cough, mild headache, and occasional nausea.  She reports a fever to maximum of 99.1.  She states that both her husband and her child have the same symptoms.  Also, patient complains of some occasional constipation; and noted one episode of bright red blood when she strained to have a bowel movement.  HPI  ROS  Past Medical History  Diagnosis Date  . No pertinent past medical history   . H/O oophorectomy   . History of wisdom tooth extraction   . Hx of hand surgery   . SVD (spontaneous vaginal delivery) 12/03/2011  . 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 Right oophorectomy    . Wisdom tooth extraction    . 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    is allergic to penicillins.    Medication List       This list is accurate as of: 10/15/14  3:44 PM.  Always use your most recent med list.               azithromycin 250 MG tablet  Commonly known as:  ZITHROMAX Z-PAK  Take 2 tabs (500 mg) PO on day # 1; then take 1 tab (250 mg) PO QD till gone.     clindamycin 1 % gel  Commonly known as:  CLINDAGEL  Apply topically 2 (two) times daily.     dexamethasone 4 MG tablet  Commonly known as:  DECADRON  Take 1 tablet (4 mg total) by mouth 2 (two) times daily. Start the day before Taxotere. Then again the day after chemo for 3 days.     escitalopram 10 MG tablet  Commonly known as:  LEXAPRO  Take 10 mg by mouth  daily.     HYDROcodone-acetaminophen 5-325 MG per tablet  Commonly known as:  NORCO/VICODIN     lidocaine-prilocaine cream  Commonly known as:  EMLA  Apply to affected area once     LORazepam 0.5 MG tablet  Commonly known as:  ATIVAN  Take 1 tablet (0.5 mg total) by mouth every 6 (six) hours as needed (Nausea or vomiting).     magic mouthwash w/lidocaine Soln  Take 5 mLs by mouth 4 (four) times daily as needed for mouth pain (DUKE'S MAGIC MOUTHWASH WITH NYSTATIN AND LIDOCAINE).     Melatonin 3 MG Caps  Take 1 capsule by mouth 3 (three) times a week.     multivitamin tablet  Take 1 tablet by mouth daily.     ondansetron 8 MG tablet  Commonly known as:  ZOFRAN  Take 1 tablet (8 mg total) by mouth 2 (two) times daily. Start the day after chemo for 3 days. Then take as needed for nausea or vomiting.     prochlorperazine 10 MG tablet  Commonly known as:  COMPAZINE  Take 1 tablet (10 mg total) by mouth every 6 (six) hours as needed (Nausea or vomiting).     UNABLE TO FIND  1 each  by Other route daily. 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         PHYSICAL EXAMINATION  Oncology Vitals 10/15/2014 10/04/2014 09/27/2014 09/27/2014 09/27/2014 09/27/2014 09/27/2014  Height - 180 cm - - - - -  Weight 77.157 kg 76.839 kg - - - - -  Weight (lbs) 170 lbs 2 oz 169 lbs 6 oz - - - - -  BMI (kg/m2) - 23.63 kg/m2 - - - - -  Temp 99.1 97.9 98 98.2 98 98.3 98.3  Pulse 84 78 82 79 82 81 82  Resp _0 SpO2 100 100 100 100 100 100 100  BSA (m2) - 1.96 m2 - - - - -   BP Readings from Last 3 Encounters:  10/15/14 117/56  10/04/14 135/66  09/27/14 110/55    Physical Exam  Constitutional: She is oriented to person, place, and time and well-developed, well-nourished, and in no distress.  HENT:  Head: Normocephalic and atraumatic.  Oropharynx with bright red erythema; but no exudate.  Patient managing oral  secretions with no difficulty.  No URI symptoms whatsoever.  Eyes: Conjunctivae and EOM are normal. Pupils are equal, round, and reactive to light. Right eye exhibits no discharge. Left eye exhibits no discharge. No scleral icterus.  Neck: Normal range of motion. Neck supple. No JVD present. No tracheal deviation present. No thyromegaly present.  Cardiovascular: Normal rate, regular rhythm, normal heart sounds and intact distal pulses.   Pulmonary/Chest: Effort normal and breath sounds normal. No respiratory distress. She has no wheezes. She has no rales. She exhibits no tenderness.  Abdominal: Soft. Bowel sounds are normal. She exhibits no distension and no mass. There is no tenderness. There is no rebound and no guarding.  Musculoskeletal: Normal range of motion. She exhibits no edema or tenderness.  Lymphadenopathy:    She has no cervical adenopathy.  Neurological: She is alert and oriented to person, place, and time. Gait normal.  Skin: Skin is warm and dry. No rash noted. No erythema. No pallor.  Psychiatric: Affect normal.  Nursing note and vitals reviewed.   LABORATORY DATA:. Appointment on 10/15/2014  Component Date Value Ref Range Status  . WBC 10/15/2014 14.9* 3.9 - 10.3 10e3/uL Final  . NEUT# 10/15/2014 13.2* 1.5 - 6.5 10e3/uL Final  . HGB 10/15/2014 11.4* 11.6 - 15.9 g/dL Final  . HCT 10/15/2014 34.6* 34.8 - 46.6 % Final  . Platelets 10/15/2014 127* 145 - 400 10e3/uL Final  . MCV 10/15/2014 91.5  79.5 - 101.0 fL Final  . MCH 10/15/2014 30.2  25.1 - 34.0 pg Final  . MCHC 10/15/2014 32.9  31.5 - 36.0 g/dL Final  . RBC 10/15/2014 3.78  3.70 - 5.45 10e6/uL Final  . RDW 10/15/2014 13.3  11.2 - 14.5 % Final  . lymph# 10/15/2014 0.9  0.9 - 3.3 10e3/uL Final  . MONO# 10/15/2014 0.8  0.1 - 0.9 10e3/uL Final  . Eosinophils Absolute 10/15/2014 0.0  0.0 - 0.5 10e3/uL Final  . Basophils Absolute 10/15/2014 0.0  0.0 - 0.1 10e3/uL Final  . NEUT% 10/15/2014 88.6* 38.4 - 76.8 % Final  .  LYMPH% 10/15/2014 6.1* 14.0 - 49.7 % Final  . MONO% 10/15/2014 5.0  0.0 - 14.0 % Final  . EOS% 10/15/2014 0.0  0.0 - 7.0 % Final  . BASO% 10/15/2014 0.3  0.0 - 2.0 % Final  . nRBC 10/15/2014 0  0 - 0 %  Final  . Sodium 10/15/2014 139  136 - 145 mEq/L Final  . Potassium 10/15/2014 4.2  3.5 - 5.1 mEq/L Final  . Chloride 10/15/2014 105  98 - 109 mEq/L Final  . CO2 10/15/2014 23  22 - 29 mEq/L Final  . Glucose 10/15/2014 111  70 - 140 mg/dl Final  . BUN 10/15/2014 16.5  7.0 - 26.0 mg/dL Final  . Creatinine 10/15/2014 0.7  0.6 - 1.1 mg/dL Final  . Total Bilirubin 10/15/2014 0.38  0.20 - 1.20 mg/dL Final  . Alkaline Phosphatase 10/15/2014 75  40 - 150 U/L Final  . AST 10/15/2014 26  5 - 34 U/L Final  . ALT 10/15/2014 25  0 - 55 U/L Final  . Total Protein 10/15/2014 7.0  6.4 - 8.3 g/dL Final  . Albumin 10/15/2014 3.8  3.5 - 5.0 g/dL Final  . Calcium 10/15/2014 8.9  8.4 - 10.4 mg/dL Final  . Anion Gap 10/15/2014 11  3 - 11 mEq/L Final  . EGFR 10/15/2014 >90  >90 ml/min/1.73 m2 Final   eGFR is calculated using the CKD-EPI Creatinine Equation (2009)  . Source 10/15/2014 THROAT   Preliminary  . Streptococcus, Group A Screen (Dir* 10/15/2014 NEG  NEGATIVE Preliminary   Comment:  A Rapid Antigen test may result negative if the antigen level in thesample is below the detection level of this test. The FDA has notcleared this test as a stand-alone test therefore the rapid antigennegative result has reflexed to a Group A Strep  culture, unit [id number].        RADIOGRAPHIC STUDIES: No results found.  ASSESSMENT/PLAN:    Breast cancer of upper-outer quadrant of left female breast Patient received her last cycle of Taxotere/carboplatin/Herceptin/Perjeta chemotherapy on 10/04/2014.  She is scheduled to return on 10/18/2014 for her next cycle of chemotherapy.   Pharyngitis Patient reports sore throat, nonproductive cough, and low-grade fever of maximum 99.1 for the past few days.  She also reports  mild headache and some occasional nausea as well.  She states that both her husband and her child also have had the same symptoms.  On exam-no obvious URI symptoms.  Posterior oropharynx with erythema; but no exudate.  Patient appears to be managing all oral secretions with no difficulty.  Bilateral breath sounds clear; with no cough or wheeze.  No obvious shortness of breath on exam.  Obtained a strep swab; and rapid strep screen is pending results.  Patient appears nontoxic.  However, will prescribe Zithromax (pt is allergic to PCN) for treatment of possible strep throat; and pharyngitis.  Patient was advised to call/return to go directly to the emergency department over the weekend if she develops any worsening symptoms whatsoever.   Constipation Patient states that she has been slightly constipated for the past several days; and has occasionally been straining to have a bowel movement.  She noticed one episode of bright red blood when wiping after a bowel movement.  Patient was encouraged to try the bowel regimen with stool softeners and Corrin Parker on an as-needed basis to keep her bowel movements regular.  Patient was also encouraged to call/return or go directly to the emergency department for any worsening rectal bleeding issues whatsoever.   Patient stated understanding of all instructions; and was in agreement with this plan of care. The patient knows to call the clinic with any problems, questions or concerns.   Review/collaboration with Dr. Lindi Adie regarding all aspects of patient's visit today.   Total time spent with patient was  25 minutes;  with greater than 75 percent of that time spent in face to face counseling regarding patient's symptoms,  and coordination of care and follow up.  Disclaimer: This note was dictated with voice recognition software. Similar sounding words can inadvertently be transcribed and may not be corrected upon review.   Drue Second, NP 10/15/2014

## 2014-10-15 NOTE — Assessment & Plan Note (Signed)
Patient received her last cycle of Taxotere/carboplatin/Herceptin/Perjeta chemotherapy on 10/04/2014.  She is scheduled to return on 10/18/2014 for her next cycle of chemotherapy.

## 2014-10-17 LAB — RAPID STREP SCREEN (MED CTR MEBANE ONLY): STREPTOCOCCUS, GROUP A SCREEN (DIRECT): NEGATIVE

## 2014-10-17 LAB — THROAT CULTURE

## 2014-10-17 NOTE — Assessment & Plan Note (Signed)
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 Biopsy of the right breast mass and left axillary lymph node and the secondary mass left breast back as benign.  Treatment plan: Neoadjuvant chemotherapy with [place] followed by mastectomy with reconstruction with adjuvant maintenance Herceptin, along with anti-estrogen therapy with tamoxifen 10 years Current treatment: Cycle 2 day 1 of TCH Perjeta  Toxicities: 1. Diarrhea and abdominal pain related to Perjeta improved with Imodium, I also recommended that she take Zantac for the stomach pain. 2. Fatigue day 5-7 3. Acneiform rash on the face and chest wall I prescribed clindamycin topical ointment 4. Sore throat, nonproductive cough, mild headache, and occasional nausea  RTC in 3 weeks

## 2014-10-18 ENCOUNTER — Telehealth: Payer: Self-pay | Admitting: Hematology and Oncology

## 2014-10-18 ENCOUNTER — Other Ambulatory Visit: Payer: Self-pay

## 2014-10-18 ENCOUNTER — Ambulatory Visit: Payer: Self-pay

## 2014-10-18 ENCOUNTER — Other Ambulatory Visit (HOSPITAL_BASED_OUTPATIENT_CLINIC_OR_DEPARTMENT_OTHER): Payer: Managed Care, Other (non HMO)

## 2014-10-18 ENCOUNTER — Telehealth: Payer: Self-pay

## 2014-10-18 ENCOUNTER — Ambulatory Visit (HOSPITAL_BASED_OUTPATIENT_CLINIC_OR_DEPARTMENT_OTHER): Payer: Managed Care, Other (non HMO)

## 2014-10-18 ENCOUNTER — Ambulatory Visit: Payer: Managed Care, Other (non HMO)

## 2014-10-18 ENCOUNTER — Ambulatory Visit (HOSPITAL_BASED_OUTPATIENT_CLINIC_OR_DEPARTMENT_OTHER): Payer: Managed Care, Other (non HMO) | Admitting: Hematology and Oncology

## 2014-10-18 VITALS — BP 123/78 | HR 61 | Temp 98.5°F | Resp 18 | Ht 71.0 in | Wt 171.5 lb

## 2014-10-18 DIAGNOSIS — Z95828 Presence of other vascular implants and grafts: Secondary | ICD-10-CM

## 2014-10-18 DIAGNOSIS — R5383 Other fatigue: Secondary | ICD-10-CM

## 2014-10-18 DIAGNOSIS — C50412 Malignant neoplasm of upper-outer quadrant of left female breast: Secondary | ICD-10-CM | POA: Diagnosis not present

## 2014-10-18 DIAGNOSIS — Z5112 Encounter for antineoplastic immunotherapy: Secondary | ICD-10-CM

## 2014-10-18 DIAGNOSIS — Z5111 Encounter for antineoplastic chemotherapy: Secondary | ICD-10-CM | POA: Diagnosis not present

## 2014-10-18 DIAGNOSIS — R109 Unspecified abdominal pain: Secondary | ICD-10-CM | POA: Diagnosis not present

## 2014-10-18 DIAGNOSIS — R21 Rash and other nonspecific skin eruption: Secondary | ICD-10-CM

## 2014-10-18 LAB — COMPREHENSIVE METABOLIC PANEL (CC13)
ALT: 31 U/L (ref 0–55)
AST: 28 U/L (ref 5–34)
Albumin: 3.7 g/dL (ref 3.5–5.0)
Alkaline Phosphatase: 64 U/L (ref 40–150)
Anion Gap: 10 mEq/L (ref 3–11)
BILIRUBIN TOTAL: 0.32 mg/dL (ref 0.20–1.20)
BUN: 15.3 mg/dL (ref 7.0–26.0)
CO2: 23 mEq/L (ref 22–29)
Calcium: 9.2 mg/dL (ref 8.4–10.4)
Chloride: 106 mEq/L (ref 98–109)
Creatinine: 0.7 mg/dL (ref 0.6–1.1)
EGFR: >90 mL/min/{1.73_m2} (ref >90)
Glucose: 106 mg/dl (ref 70–140)
Potassium: 4 mEq/L (ref 3.5–5.1)
Sodium: 140 mEq/L (ref 136–145)
Total Protein: 6.8 g/dL (ref 6.4–8.3)

## 2014-10-18 LAB — CBC WITH DIFFERENTIAL/PLATELET
BASO%: 0.3 % (ref 0.0–2.0)
BASOS ABS: 0 10*3/uL (ref 0.0–0.1)
EOS ABS: 0 10*3/uL (ref 0.0–0.5)
EOS%: 0 % (ref 0.0–7.0)
HCT: 31.1 % — ABNORMAL LOW (ref 34.8–46.6)
HEMOGLOBIN: 10.2 g/dL — AB (ref 11.6–15.9)
LYMPH#: 0.9 10*3/uL (ref 0.9–3.3)
LYMPH%: 13.6 % — AB (ref 14.0–49.7)
MCH: 29.6 pg (ref 25.1–34.0)
MCHC: 32.9 g/dL (ref 31.5–36.0)
MCV: 89.9 fL (ref 79.5–101.0)
MONO#: 0.4 10*3/uL (ref 0.1–0.9)
MONO%: 6.2 % (ref 0.0–14.0)
NEUT#: 5.3 10*3/uL (ref 1.5–6.5)
NEUT%: 79.9 % — AB (ref 38.4–76.8)
Platelets: 173 10*3/uL (ref 145–400)
RBC: 3.46 10*6/uL — AB (ref 3.70–5.45)
RDW: 13.6 % (ref 11.2–14.5)
WBC: 6.6 10*3/uL (ref 3.9–10.3)

## 2014-10-18 MED ORDER — SODIUM CHLORIDE 0.9 % IV SOLN
Freq: Once | INTRAVENOUS | Status: AC
  Administered 2014-10-18: 10:00:00 via INTRAVENOUS

## 2014-10-18 MED ORDER — ACETAMINOPHEN 325 MG PO TABS
650.0000 mg | ORAL_TABLET | Freq: Once | ORAL | Status: AC
Start: 2014-10-18 — End: 2014-10-18
  Administered 2014-10-18: 650 mg via ORAL

## 2014-10-18 MED ORDER — PEGFILGRASTIM 6 MG/0.6ML ~~LOC~~ PSKT
6.0000 mg | PREFILLED_SYRINGE | Freq: Once | SUBCUTANEOUS | Status: AC
  Administered 2014-10-18: 6 mg via SUBCUTANEOUS
  Filled 2014-10-18: qty 0.6

## 2014-10-18 MED ORDER — SODIUM CHLORIDE 0.9 % IV SOLN
750.0000 mg | Freq: Once | INTRAVENOUS | Status: AC
  Administered 2014-10-18: 750 mg via INTRAVENOUS
  Filled 2014-10-18: qty 75

## 2014-10-18 MED ORDER — TRASTUZUMAB CHEMO INJECTION 440 MG
6.0000 mg/kg | Freq: Once | INTRAVENOUS | Status: AC
  Administered 2014-10-18: 462 mg via INTRAVENOUS
  Filled 2014-10-18: qty 22

## 2014-10-18 MED ORDER — SODIUM CHLORIDE 0.9 % IJ SOLN
10.0000 mL | INTRAMUSCULAR | Status: DC | PRN
  Administered 2014-10-18: 10 mL
  Filled 2014-10-18: qty 10

## 2014-10-18 MED ORDER — PALONOSETRON HCL INJECTION 0.25 MG/5ML
0.2500 mg | Freq: Once | INTRAVENOUS | Status: AC
  Administered 2014-10-18: 0.25 mg via INTRAVENOUS

## 2014-10-18 MED ORDER — DIPHENHYDRAMINE HCL 25 MG PO CAPS
ORAL_CAPSULE | ORAL | Status: AC
  Filled 2014-10-18: qty 2

## 2014-10-18 MED ORDER — HEPARIN SOD (PORK) LOCK FLUSH 100 UNIT/ML IV SOLN
500.0000 [IU] | Freq: Once | INTRAVENOUS | Status: AC | PRN
  Administered 2014-10-18: 500 [IU]
  Filled 2014-10-18: qty 5

## 2014-10-18 MED ORDER — SODIUM CHLORIDE 0.9 % IV SOLN
Freq: Once | INTRAVENOUS | Status: AC
  Administered 2014-10-18: 10:00:00 via INTRAVENOUS
  Filled 2014-10-18: qty 1

## 2014-10-18 MED ORDER — SODIUM CHLORIDE 0.9 % IJ SOLN
10.0000 mL | INTRAMUSCULAR | Status: DC | PRN
  Administered 2014-10-18: 10 mL via INTRAVENOUS
  Filled 2014-10-18: qty 10

## 2014-10-18 MED ORDER — SODIUM CHLORIDE 0.9 % IV SOLN
420.0000 mg | Freq: Once | INTRAVENOUS | Status: AC
  Administered 2014-10-18: 420 mg via INTRAVENOUS
  Filled 2014-10-18: qty 14

## 2014-10-18 MED ORDER — PALONOSETRON HCL INJECTION 0.25 MG/5ML
INTRAVENOUS | Status: AC
  Filled 2014-10-18: qty 5

## 2014-10-18 MED ORDER — DOCETAXEL CHEMO INJECTION 160 MG/16ML
75.0000 mg/m2 | Freq: Once | INTRAVENOUS | Status: AC
  Administered 2014-10-18: 150 mg via INTRAVENOUS
  Filled 2014-10-18: qty 15

## 2014-10-18 MED ORDER — DIPHENHYDRAMINE HCL 25 MG PO CAPS
50.0000 mg | ORAL_CAPSULE | Freq: Once | ORAL | Status: AC
  Administered 2014-10-18: 50 mg via ORAL

## 2014-10-18 MED ORDER — ACETAMINOPHEN 325 MG PO TABS
ORAL_TABLET | ORAL | Status: AC
  Filled 2014-10-18: qty 2

## 2014-10-18 NOTE — Progress Notes (Signed)
Patient in for [facility] access and labs. Port-A-Cath accessed but unable to flush with normal saline. PAC needle adjusted, but continued to be unable to flush PAC. Daisy Floro, LPN and Deland Pretty, RN in to assess Patient's PAC. Daisy Floro, LPN adjusted Patient's PAC needle, but was still unable to flush port. Encouraged Patient to lean forward. At that time the [facility] flushed and blood return noted. Blood obtained for labs and PAC covered with Tegaderm dressing. Per [facility] Chest X-Ray on 09/24/14, Port-A-Cath tip is in SVC. Patient denies any pain or discomfort at this time. Patient discharged from the Flush Room.

## 2014-10-18 NOTE — Patient Instructions (Signed)
[facility] Discharge Instructions for Patients Receiving Chemotherapy  Today you received the following chemotherapy agents: Herceptin, perjeta, taxotere, carboplatin  To help prevent nausea and vomiting after your treatment, we encourage you to take your nausea medication:compazine 10 mg every 6 hours as needed, Zofran 8 mg every 8 hours as needed.  If you develop nausea and vomiting that is not controlled by your nausea medication, call the clinic.   BELOW ARE SYMPTOMS THAT SHOULD BE REPORTED IMMEDIATELY:  *FEVER GREATER THAN 100.5 F  *CHILLS WITH OR WITHOUT FEVER  NAUSEA AND VOMITING THAT IS NOT CONTROLLED WITH YOUR NAUSEA MEDICATION  *UNUSUAL SHORTNESS OF BREATH  *UNUSUAL BRUISING OR BLEEDING  TENDERNESS IN MOUTH AND THROAT WITH OR WITHOUT PRESENCE OF ULCERS  *URINARY PROBLEMS  *BOWEL PROBLEMS  UNUSUAL RASH Items with * indicate a potential emergency and should be followed up as soon as possible.  Feel free to call the clinic you have any questions or concerns. The clinic phone number is [phone].  Please show the [place] at check-in to the Emergency Department and triage nurse.

## 2014-10-18 NOTE — Telephone Encounter (Signed)
Appointments made and avs printed for patient °

## 2014-10-18 NOTE — Patient Instructions (Signed)

## 2014-10-18 NOTE — Telephone Encounter (Signed)
lvm we are f/u on visit from 4/29 about her sore throat. Call if any problem.

## 2014-10-18 NOTE — Progress Notes (Signed)
Patient Care Team: Loraine Leriche, MD as PCP - General (Internal Medicine)  DIAGNOSIS: No matching staging information was found for the patient.  SUMMARY OF ONCOLOGIC HISTORY:   Breast cancer of upper-outer quadrant of left female breast   08/27/2014 Initial Diagnosis Excisional biopsy: 2 lumps showing 1.5 cm and 0.9 cm IDC, Grade 3, ER Pos, PR Neg and HER-2 amplified Ratio 2.6, Multifocal   09/27/2014 -  Neo-Adjuvant Chemotherapy Neoadjuvant TCH Perjeta every 3 week 6 followed by Herceptin maintenance    CHIEF COMPLIANT: Recent upper respiratory tract infection, cycle 2 TCH Perjeta  INTERVAL HISTORY: Natasha Torres is a 34 year old with above-mentioned history of HER-2 positive breast cancer currently on neoadjuvant chemotherapy with TCH Perjeta. After cycle 1 of treatment she had mild nausea and also noted upper respiratory infection for which she is now on azithromycin. Her symptoms have markedly improved. She does not have any further fevers or chills. She had mild nausea.  REVIEW OF SYSTEMS:   Constitutional: Denies fevers, chills or abnormal weight loss Eyes: Denies blurriness of vision Ears, nose, mouth, throat, and face: Denies mucositis or sore throat Respiratory: Denies cough, dyspnea or wheezes Cardiovascular: Denies palpitation, chest discomfort or lower extremity swelling Gastrointestinal:  Denies nausea, heartburn or change in bowel habits Skin: Denies abnormal skin rashes Lymphatics: Denies new lymphadenopathy or easy bruising Neurological:Denies numbness, tingling or new weaknesses Behavioral/Psych: Mood is stable, no new changes  All other systems were reviewed with the patient and are negative.  I have reviewed the past medical history, past surgical history, social history and family history with the patient and they are unchanged from previous note.  ALLERGIES:  is allergic to penicillins.  MEDICATIONS:  Current Outpatient Prescriptions  Medication Sig  Dispense Refill  . Alum & Mag Hydroxide-Simeth (MAGIC MOUTHWASH W/LIDOCAINE) SOLN Take 5 mLs by mouth 4 (four) times daily as needed for mouth pain (DUKE'S MAGIC MOUTHWASH WITH NYSTATIN AND LIDOCAINE). 240 mL 0  . azithromycin (ZITHROMAX Z-PAK) 250 MG tablet Take 2 tabs (500 mg) PO on day # 1; then take 1 tab (250 mg) PO QD till gone. 6 each 0  . clindamycin (CLINDAGEL) 1 % gel Apply topically 2 (two) times daily. 30 g 0  . dexamethasone (DECADRON) 4 MG tablet Take 1 tablet (4 mg total) by mouth 2 (two) times daily. Start the day before Taxotere. Then again the day after chemo for 3 days. 30 tablet 1  . escitalopram (LEXAPRO) 10 MG tablet Take 10 mg by mouth daily.    [name] HYDROcodone-acetaminophen (NORCO/VICODIN) 5-325 MG per tablet     . lidocaine-prilocaine (EMLA) cream Apply to affected area once 30 g 3  . LORazepam (ATIVAN) 0.5 MG tablet Take 1 tablet (0.5 mg total) by mouth every 6 (six) hours as needed (Nausea or vomiting). 30 tablet 0  . Melatonin 3 MG CAPS Take 1 capsule by mouth 3 (three) times a week.    . Multiple Vitamin (MULTIVITAMIN) tablet Take 1 tablet by mouth daily.    . ondansetron (ZOFRAN) 8 MG tablet Take 1 tablet (8 mg total) by mouth 2 (two) times daily. Start the day after chemo for 3 days. Then take as needed for nausea or vomiting. 30 tablet 1  . prochlorperazine (COMPAZINE) 10 MG tablet Take 1 tablet (10 mg total) by mouth every 6 (six) hours as needed (Nausea or vomiting). 30 tablet 1  . UNABLE TO FIND 1 each by Other route daily. 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   Facility-Administered Medications Ordered in Other Visits  Medication Dose Route Frequency Provider Last Rate Last Dose  . sodium chloride 0.9 % injection 10 mL  10 mL Intravenous PRN Nicholas Lose, MD   10 mL at 10/18/14 0843    PHYSICAL EXAMINATION: ECOG  PERFORMANCE STATUS: 1 - Symptomatic but completely ambulatory  Filed Vitals:   10/18/14 0904  BP: 123/78  Pulse: 61  Temp: 98.5 F (36.9 C)  Resp: 18   Filed Weights   10/18/14 0904  Weight: 171 lb 8 oz (77.792 kg)    GENERAL:alert, no distress and comfortable SKIN: skin color, texture, turgor are normal, no rashes or significant lesions EYES: normal, Conjunctiva are pink and non-injected, sclera clear OROPHARYNX:no exudate, no erythema and lips, buccal mucosa, and tongue normal  NECK: supple, thyroid normal size, non-tender, without nodularity LYMPH:  no palpable lymphadenopathy in the cervical, axillary or inguinal LUNGS: clear to auscultation and percussion with normal breathing effort HEART: regular rate & rhythm and no murmurs and no lower extremity edema ABDOMEN:abdomen soft, non-tender and normal bowel sounds Musculoskeletal:no cyanosis of digits and no clubbing  NEURO: alert & oriented x 3 with fluent speech, no focal motor/sensory deficits  LABORATORY DATA:  I have reviewed the data as listed   Chemistry      Component Value Date/Time   NA 140 10/18/2014 0837   NA 135 11/29/2011 1615   K 4.0 10/18/2014 0837   K 4.6 11/29/2011 1615   CL 102 11/29/2011 1615   CO2 23 10/18/2014 0837   CO2 24 11/29/2011 1615   BUN 15.3 10/18/2014 0837   BUN 14 11/29/2011 1615   CREATININE 0.7 10/18/2014 0837   CREATININE 0.71 11/29/2011 1615      Component Value Date/Time   CALCIUM 9.2 10/18/2014 0837   CALCIUM 9.2 11/29/2011 1615   ALKPHOS 64 10/18/2014 0837   ALKPHOS 121* 11/29/2011 1615   AST 28 10/18/2014 0837   AST 27 11/29/2011 1615   ALT 31 10/18/2014 0837   ALT 24 11/29/2011 1615   BILITOT 0.32 10/18/2014 0837   BILITOT 0.1* 11/29/2011 1615       Lab Results  Component Value Date   WBC 6.6 10/18/2014   HGB 10.2* 10/18/2014   HCT 31.1* 10/18/2014   MCV 89.9 10/18/2014   PLT 173 10/18/2014   NEUTROABS 5.3 10/18/2014    ASSESSMENT & PLAN:  Breast cancer  of upper-outer quadrant of left female breast 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 Biopsy of the right breast mass and left axillary lymph node and the secondary mass left breast back as benign.  Treatment plan: Neoadjuvant chemotherapy with [place] followed by mastectomy with reconstruction with adjuvant maintenance Herceptin, along with anti-estrogen therapy with tamoxifen 10 years Current treatment: Cycle 2 day 1 of TCH Perjeta  Toxicities: 1. Diarrhea and abdominal pain related to Perjeta improved with Imodium, I also recommended that she take Zantac for the stomach pain. 2. Fatigue day 5-7 3. Acneiform rash on the face and chest wall I prescribed clindamycin topical ointment 4. Sore throat, nonproductive cough, mild headache,  and occasional nausea  RTC in 3 weeks    No orders of the defined types were placed in this encounter.   The patient has a good understanding of the overall plan. she agrees with it. she will call with any problems that may develop before the next visit here.   Rulon Eisenmenger, MD

## 2014-10-19 ENCOUNTER — Encounter: Payer: Self-pay | Admitting: General Practice

## 2014-10-21 ENCOUNTER — Ambulatory Visit: Payer: Managed Care, Other (non HMO) | Admitting: Radiation Oncology

## 2014-10-21 ENCOUNTER — Ambulatory Visit: Payer: Managed Care, Other (non HMO)

## 2014-10-25 ENCOUNTER — Ambulatory Visit (HOSPITAL_BASED_OUTPATIENT_CLINIC_OR_DEPARTMENT_OTHER): Payer: Managed Care, Other (non HMO) | Admitting: Nurse Practitioner

## 2014-10-25 ENCOUNTER — Encounter: Payer: Self-pay | Admitting: *Deleted

## 2014-10-25 ENCOUNTER — Encounter: Payer: Self-pay | Admitting: Nurse Practitioner

## 2014-10-25 VITALS — BP 134/81 | HR 82 | Temp 98.0°F | Resp 18 | Wt 172.9 lb

## 2014-10-25 DIAGNOSIS — C50412 Malignant neoplasm of upper-outer quadrant of left female breast: Secondary | ICD-10-CM

## 2014-10-25 DIAGNOSIS — K209 Esophagitis, unspecified without bleeding: Secondary | ICD-10-CM

## 2014-10-25 MED ORDER — MAGIC MOUTHWASH W/LIDOCAINE: 5.0000 mL | Freq: Four times a day (QID) | ORAL | Status: DC | PRN

## 2014-10-25 MED ORDER — SUCRALFATE 1 G PO TABS: 1.0000 g | ORAL_TABLET | Freq: Three times a day (TID) | ORAL | Status: DC

## 2014-10-25 NOTE — Progress Notes (Signed)
Location of Breast Cancer:Stage 1, grade 3  Histology per Pathology Report:09/14/14  FINAL DIAGNOSIS Diagnosis 1. Consult Slide , A. Left Breast mass 1:00, Lumpectomy; [id number] - INVASIVE DUCTAL CARCINOMA, GRADE III/III, SPANNING 1.5 CM. - DUCTAL CARCINOMA IN SITU, HIGH GRADE - INVASIVE CARCINOMA IS BROADLY PRESENT AT THE LATERAL MARGIN OF SPECIMEN #1 AND LESS THAN 0.1 CM TO THE POSTERIOR MARGIN OF SPECIMEN #1. - DUCTAL CARCINOMA IN SITU IS FOCALLY 0.1 CM TO THE LATERAL MARGIN OF SPECIMEN #1. - LYMPHOVASCULAR INVASION IS IDENTIFIED. - SEE ONCOLOGY TABLE BELOW. 2. Consult Slide , B. Left Breast mass 3-4:00, Lumpectomy; [id number] - INVASIVE DUCTAL CARCINOMA, GRADE III/III, SPANNING AT LEAST 0.8 CM - DUCTAL CARCINOMA IN SITU, HIGH GRADE. - INVASIVE CARCINOMA IS BROADLY LESS THAN 0.1 CM TO THE ANTERIOR MARGIN OF SPECIMEN #2. - DUCTAL CARCINOMA IN SITU IS BROADLY PRESENT AT THE POSTERIOR MARGIN OF SPECIMEN #2. - SEE ONCOLOGY TABLE BELOW. Receptor Status: ER(+), PR (-), Her2-neu (+)  Did patient present with symptoms (if so, please note symptoms) or was this found on screening mammography?:Patient palpated lump at 1 o'clock position. Confirmed on diagnostic bilateral mammogram and ultra-sound 08/13/14  Past/Anticipated interventions by surgeon, if any:  Past/Anticipated interventions by medical oncology, if any: Chemotherapy:Treatment plan: Neoadjuvant chemotherapy with [place] followed by mastectomy with reconstruction with adjuvant maintenance Herceptin, along with anti-estrogen therapy with tamoxifen 10 years Current treatment: Cycle 2 day 1 of [place] Perjeta  Lymphedema issues, if any: No  Pain issues, if any:No  SAFETY ISSUES:  Prior radiation?No  Pacemaker/ICD?No   Possible current pregnancy?No, IUD inserted 2 weeks ago  Is the patient on methotrexate?No  Current Complaints / other details:Married. 1 daughter 67.64 years old.Menarche age 65,first live birth 43.Last menstrual  period.  Allergies:Penicillin, morphine Anxiety  Smoked less than 1 year as teenager. Drinks alcohol. Father:prostate cancer  Paternal grandfather:4 types of cancer one of which was breast cancer    Arlyss Repress, RN 10/25/2014,3:48 PM

## 2014-10-25 NOTE — Assessment & Plan Note (Signed)
Patient received her last cycle of Taxotere/carboplatin/Herceptin/Perjeta chemotherapy on 10/18/2014.   Patient received his she is scheduled to receive her next Zoladex injection and obtain her radiation oncology consultation on 10/28/2014.   She is scheduled to return on 05/023/2016 for her next cycle of chemotherapy.

## 2014-10-25 NOTE — Assessment & Plan Note (Signed)
Patient complaint of sore throat last week; and was prescribed Zithromax for acute pharyngitis symptoms.  Strep swab test at that time was negative.  Patient states that she continues with a mild sore throat; and some esophagitis irritation as well.  She has developed some laryngitis as well.  She continues with a dry, nonproductive cough.  She denies any recent fevers or chills.  On exam.-Patient continues with mild erythema to her posterior oropharynx; but no exudate noted.  Patient swallowing in managing all secretions well.  Most likely, patient is experiencing some continued mild pharyngitis/esophagitis.  Will prescribe Magic mouthwash with lidocaine and Carafate for the patient to try.

## 2014-10-25 NOTE — Progress Notes (Signed)
SYMPTOM MANAGEMENT CLINIC   HPI: Natasha Torres 34 y.o. female diagnosed with breast cancer.  Currently undergoing neoadjuvant Taxotere/carboplatin/Herceptin/Perjeta chemotherapy regimen.  Patient complaint of sore throat last week; and was prescribed Zithromax for acute pharyngitis symptoms.  Strep swab test at that time was negative.  Patient states that she continues with a mild sore throat; and some esophagitis irritation as well.  She has developed some laryngitis as well.  She continues with a dry, nonproductive cough.  She denies any recent fevers or chills.  HPI  ROS  Past Medical History  Diagnosis Date  . No pertinent past medical history   . H/O oophorectomy   . History of wisdom tooth extraction   . Hx of hand surgery   . SVD (spontaneous vaginal delivery) 12/03/2011  . 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 Right oophorectomy    . Wisdom tooth extraction    . 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    is allergic to penicillins.    Medication List       This list is accurate as of: 10/25/14  6:17 PM.  Always use your most recent med list.               azithromycin 250 MG tablet  Commonly known as:  ZITHROMAX Z-PAK  Take 2 tabs (500 mg) PO on day # 1; then take 1 tab (250 mg) PO QD till gone.     clindamycin 1 % gel  Commonly known as:  CLINDAGEL  Apply topically 2 (two) times daily.     dexamethasone 4 MG tablet  Commonly known as:  DECADRON  Take 1 tablet (4 mg total) by mouth 2 (two) times daily. Start the day before Taxotere. Then again the day after chemo for 3 days.     escitalopram 10 MG tablet  Commonly known as:  LEXAPRO    Take 10 mg by mouth daily.     HYDROcodone-acetaminophen 5-325 MG per tablet  Commonly known as:  NORCO/VICODIN     lidocaine-prilocaine cream  Commonly known as:  EMLA  Apply to affected area once     LORazepam 0.5 MG tablet  Commonly known as:  ATIVAN  Take 1 tablet (0.5 mg total) by mouth every 6 (six) hours as needed (Nausea or vomiting).     magic mouthwash w/lidocaine Soln  Take 5 mLs by mouth 4 (four) times daily as needed for mouth pain (D[facility]'s mouthwash w/ nystatin 1:1).     Melatonin 3 MG Caps  Take 1 capsule by mouth 3 (three) times a week.     multivitamin tablet  Take 1 tablet by mouth daily.     ondansetron 8 MG tablet  Commonly known as:  ZOFRAN  Take 1 tablet (8 mg total) by mouth 2 (two) times daily. Start the day after chemo for 3 days. Then take as needed for nausea or vomiting.     prochlorperazine 10 MG tablet  Commonly known as:  COMPAZINE  Take 1 tablet (10 mg total) by mouth every 6 (six) hours as needed (Nausea or vomiting).     sucralfate 1 G tablet  Commonly known as:  CARAFATE  Take 1 tablet (1 g total) by mouth 4 (four) times daily -  with meals and at bedtime.     UNABLE TO FIND  1 each by Other route daily. 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         PHYSICAL EXAMINATION  Oncology Vitals 10/25/2014 10/18/2014 10/15/2014 10/04/2014 09/27/2014 09/27/2014 09/27/2014  Height - 180 cm - 180 cm - - -  Weight 78.427 kg 77.792 kg 77.157 kg 76.839 kg - - -  Weight (lbs) 172 lbs 14 oz 171 lbs 8 oz 170 lbs 2 oz 169 lbs 6 oz - - -  BMI (kg/m2) - 23.92 kg/m2 - 23.63 kg/m2 - - -  Temp 98 98.5 99.1 97.9 98 98.2 98  Pulse 82 61 84 78 82 79 82  Resp 18 18 18 18 18 18 18  SpO2 100 - 100 100 100 100 100  BSA (m2) - 1.97 m2 - 1.96 m2 - - -   BP Readings from Last 3 Encounters:  10/25/14 134/81  10/18/14 123/78  10/15/14 117/56    Physical Exam  Constitutional: She is oriented to  person, place, and time and well-developed, well-nourished, and in no distress.  HENT:  Head: Normocephalic and atraumatic.  Oropharynx with only trace erythema; but no exudate.  Patient managing oral secretions with no difficulty.  Patient does have obvious laryngitis.  No URI symptoms whatsoever.  Eyes: Conjunctivae and EOM are normal. Pupils are equal, round, and reactive to light. Right eye exhibits no discharge. Left eye exhibits no discharge. No scleral icterus.  Neck: Normal range of motion. Neck supple. No JVD present. No tracheal deviation present. No thyromegaly present.  Cardiovascular: Normal rate, regular rhythm, normal heart sounds and intact distal pulses.   Pulmonary/Chest: Effort normal and breath sounds normal. No respiratory distress. She has no wheezes. She has no rales. She exhibits no tenderness.  Abdominal: Soft. Bowel sounds are normal. She exhibits no distension and no mass. There is no tenderness. There is no rebound and no guarding.  Musculoskeletal: Normal range of motion. She exhibits no edema or tenderness.  Lymphadenopathy:    She has no cervical adenopathy.  Neurological: She is alert and oriented to person, place, and time. Gait normal.  Skin: Skin is warm and dry. No rash noted. No erythema. No pallor.  Psychiatric: Affect normal.  Nursing note and vitals reviewed.   LABORATORY DATA:. No visits with results within 3 Day(s) from this visit. Latest known visit with results is:  Appointment on 10/18/2014  Component Date Value Ref Range Status  . WBC 10/18/2014 6.6  3.9 - 10.3 10e3/uL Final  . NEUT# 10/18/2014 5.3  1.5 - 6.5 10e3/uL Final  . HGB 10/18/2014 10.2* 11.6 - 15.9 g/dL Final  . HCT 10/18/2014 31.1* 34.8 - 46.6 % Final  . Platelets 10/18/2014 173  145 - 400 10e3/uL Final  . MCV 10/18/2014 89.9  79.5 - 101.0 fL Final  . MCH 10/18/2014 29.6  25.1 - 34.0 pg Final  . MCHC 10/18/2014 32.9  31.5 - 36.0 g/dL Final  . RBC 10/18/2014 3.46* 3.70 - 5.45  10e6/uL Final  . RDW 10/18/2014 13.6  11.2 - 14.5 % Final  . lymph# 10/18/2014 0.9  0.9 - 3.3 10e3/uL Final  . MONO# 10/18/2014 0.4  0.1 - 0.9 10e3/uL Final  . Eosinophils Absolute 10/18/2014 0.0  0.0 - 0.5 10e3/uL Final  . Basophils   Absolute 10/18/2014 0.0  0.0 - 0.1 10e3/uL Final  . NEUT% 10/18/2014 79.9* 38.4 - 76.8 % Final  . LYMPH% 10/18/2014 13.6* 14.0 - 49.7 % Final  . MONO% 10/18/2014 6.2  0.0 - 14.0 % Final  . EOS% 10/18/2014 0.0  0.0 - 7.0 % Final  . BASO% 10/18/2014 0.3  0.0 - 2.0 % Final  . Sodium 10/18/2014 140  136 - 145 mEq/L Final  . Potassium 10/18/2014 4.0  3.5 - 5.1 mEq/L Final  . Chloride 10/18/2014 106  98 - 109 mEq/L Final  . CO2 10/18/2014 23  22 - 29 mEq/L Final  . Glucose 10/18/2014 106  70 - 140 mg/dl Final  . BUN 10/18/2014 15.3  7.0 - 26.0 mg/dL Final  . Creatinine 10/18/2014 0.7  0.6 - 1.1 mg/dL Final  . Total Bilirubin 10/18/2014 0.32  0.20 - 1.20 mg/dL Final  . Alkaline Phosphatase 10/18/2014 64  40 - 150 U/L Final  . AST 10/18/2014 28  5 - 34 U/L Final  . ALT 10/18/2014 31  0 - 55 U/L Final  . Total Protein 10/18/2014 6.8  6.4 - 8.3 g/dL Final  . Albumin 10/18/2014 3.7  3.5 - 5.0 g/dL Final  . Calcium 10/18/2014 9.2  8.4 - 10.4 mg/dL Final  . Anion Gap 10/18/2014 10  3 - 11 mEq/L Final  . EGFR 10/18/2014 >90  >90 ml/min/1.73 m2 Final   eGFR is calculated using the CKD-EPI Creatinine Equation (2009)     RADIOGRAPHIC STUDIES: No results found.  ASSESSMENT/PLAN:    Breast cancer of upper-outer quadrant of left female breast Patient received her last cycle of Taxotere/carboplatin/Herceptin/Perjeta chemotherapy on 10/18/2014.   Patient received his she is scheduled to receive her next Zoladex injection and obtain her radiation oncology consultation on 10/28/2014.   She is scheduled to return on 05/023/2016 for her next cycle of chemotherapy.       Esophagitis Patient complaint of sore throat last week; and was prescribed Zithromax for acute  pharyngitis symptoms.  Strep swab test at that time was negative.  Patient states that she continues with a mild sore throat; and some esophagitis irritation as well.  She has developed some laryngitis as well.  She continues with a dry, nonproductive cough.  She denies any recent fevers or chills.  On exam.-Patient continues with mild erythema to her posterior oropharynx; but no exudate noted.  Patient swallowing in managing all secretions well.  Most likely, patient is experiencing some continued mild pharyngitis/esophagitis.  Will prescribe Magic mouthwash with lidocaine and Carafate for the patient to try.    Patient stated understanding of all instructions; and was in agreement with this plan of care. The patient knows to call the clinic with any problems, questions or concerns.   Review/collaboration with Dr. Lindi Adie regarding all aspects of patient's visit today.   Total time spent with patient was 25 minutes;  with greater than 75 percent of that time spent in face to face counseling regarding patient's symptoms,  and coordination of care and follow up.  Disclaimer: This note was dictated with voice recognition software. Similar sounding words can inadvertently be transcribed and may not be corrected upon review.   Drue Second, NP 10/25/2014

## 2014-10-27 ENCOUNTER — Encounter: Payer: Self-pay | Admitting: Hematology and Oncology

## 2014-10-27 ENCOUNTER — Telehealth: Payer: Self-pay

## 2014-10-27 NOTE — Progress Notes (Signed)
I placed fmla forms for hubby on desk of nurse of dr. Lindi Adie.

## 2014-10-27 NOTE — Progress Notes (Signed)
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 female  Who palpated a left breast mass in February. She sought evaluation and mammogram and ultrasound were performed on 08/13/14.  This showed 2 palpable suspicious left upper outer quadrant masses- one in the 1 o'clock position measuring 1.3 cm and another in the 2 o'clock position measuring 1.6 cm.  No abnormal left lymph nodes were ween.  A 53m area of calcifications was also seen.  underwent an excisional biopsy which showed multifocal IDC which was grade 3.  There were 2 specimens. . She is ER +, PR -, and HER2 +. Ki 67 is 30%. An MRI was performed that showed enhancement in the left breast with a hematoma measuring 4.6 x 4.9 x 4.1 cm with enhancement extending to the pectoralis muscle. 2 biopsy clips were seen in the posterior aspect of the hematoma.  Another mass in the left breast was seen and biopsied consistent with dilated ducts. Excision was recommended.  Left axillary lymph nodes were noted and biopsied and found to be negative. The right breast had a mass as well that was biopsied and found to be a benign fibroadenoma.  She has undergone staging studies and has no evidence of metastatic disease. Genetic testing has been performed and shows an ATM mutation. She is currently undergoing neoadjuvant chemotherapy under the care of Dr. GLindi Adiewith bilateral nipple sparing mastectomies planned.  She has seen plastic surgery and has decided on bilateraly nipple sparing mastectomies.   PREVIOUS RADIATION THERAPY: No  Past medical, social and family history were reviewed in the electronic chart. Review of symptoms was reviewed  in the electronic chart. Medications were reviewed in the electronic chart.   PHYSICAL EXAM:  Filed Vitals:   10/28/14 0903  BP: 114/64  Pulse: 70  Temp: 97.7 F (36.5 C)  .174 lb 11.2 oz (79.243 kg). Pleasant female in no distress. Alert and oriented x 3.   IMPRESSION: T2N0 Invasive Ductal Carcinoma of the left breast (or multifocal) currently undergoing neoadjuvant chemotherapy with a heterozygous ATM mutation  PLAN: I spoke to the patient today regarding her diagnosis and options for treatment. We discussed the equivalence in terms of survival and local failure between mastectomy and breast conservation. We discussed the role of radiation in decreasing local failures in patients who undergo mastectomy and have risk factors for recurrence including positive lymph nodes and/or tumors over 5 cm and/or positive margins. We discussed the possible side effects including but not limited to skin redness, fatigue, permanent skin darkening, and chest wall swelling. We discussed increased complications that can occur with reconstruction after radiation.   We discussed her ATM mutation in detail and the implications of damaged ATM gene to homologous recombination and DNA repair.  We discussed that at this point a mastectomy would be a better option for her as she could avoid possible significant side effects as well as exposure of the contralateral breast.  We discussed that her ATM mutation would not increase her risk of damage as far as we know to other types of radiation including UV and diagnostic level imaging.    We discussed that if she did have the above risk factors  that I would recommend radiation as the benefit oncologically at that point would outweigh the possible risks of exaggerated side effects and her risk of breast cancer on the other breast would have been mitigated by the prophylactic mastectomy.   I gave her my card and asked her to call me with any questions. I will ask our  survivorship navigator to follow up with her as well.      I spent 60 minutes  face to face with the patient and more than 50% of that time was spent in counseling and/or coordination of care.   This document serves as a record of services personally performed by Thea Silversmith, MD. It was created on her behalf by Darcus Austin, a trained medical scribe. The creation of this record is based on the scribe's personal observations and the provider's statements to them. This document has been checked and approved by the attending provider.  ------------------------------------------------  Thea Silversmith, MD

## 2014-10-27 NOTE — Telephone Encounter (Signed)
Pt states that the carafate is working better than the MMW, her voice is coming back. She feels some of the problem is from acid reflux. She had reflux last night, she did elevate her body on pillows to help. Discussed smaller meal (she did eat a big meal) and several hours before laying down.

## 2014-10-28 ENCOUNTER — Encounter: Payer: Self-pay | Admitting: Hematology and Oncology

## 2014-10-28 ENCOUNTER — Ambulatory Visit
Admission: RE | Admit: 2014-10-28 | Discharge: 2014-10-28 | Disposition: A | Discharge status: Home / Self Care | Payer: Managed Care, Other (non HMO) | Source: Ambulatory Visit | Attending: Radiation Oncology | Admitting: Radiation Oncology

## 2014-10-28 ENCOUNTER — Ambulatory Visit (HOSPITAL_BASED_OUTPATIENT_CLINIC_OR_DEPARTMENT_OTHER): Payer: Managed Care, Other (non HMO)

## 2014-10-28 ENCOUNTER — Encounter: Payer: Self-pay | Admitting: General Practice

## 2014-10-28 ENCOUNTER — Encounter: Payer: Self-pay | Admitting: Radiation Oncology

## 2014-10-28 VITALS — BP 114/64 | HR 70 | Temp 97.7°F | Wt 174.7 lb

## 2014-10-28 VITALS — BP 123/83 | HR 63 | Temp 97.8°F

## 2014-10-28 DIAGNOSIS — C50412 Malignant neoplasm of upper-outer quadrant of left female breast: Secondary | ICD-10-CM

## 2014-10-28 DIAGNOSIS — Z5111 Encounter for antineoplastic chemotherapy: Secondary | ICD-10-CM

## 2014-10-28 MED ORDER — GOSERELIN ACETATE 3.6 MG ~~LOC~~ IMPL
3.6000 mg | DRUG_IMPLANT | Freq: Once | SUBCUTANEOUS | Status: AC
  Administered 2014-10-28: 3.6 mg via SUBCUTANEOUS
  Filled 2014-10-28: qty 3.6

## 2014-10-28 NOTE — Progress Notes (Signed)
I will let patient know the fmla forms for hubby are ready for pick up.

## 2014-10-28 NOTE — Addendum Note (Signed)
Encounter addended by: Norm Salt, RN on: 10/28/2014 10:34 AM<BR>     Documentation filed: Charges VN

## 2014-10-28 NOTE — Progress Notes (Signed)
Spiritual Care Note  Had ca 1-hour appointment with Natasha Torres (45 min/spiritual support and 15 min/Advance Directives).  Natasha Torres's affect and attitude were positive as she shared and process her story of diagnosis, family relationship stressors, and past counseling support at [facility].  She shared in detail about spiritual synchronicities that have brought meaning and comfort as she has navigated these challenges and prayed for support.  She cried, as well, at areas of vulnerability and grief.  Served as a witness to her story, normalizing feelings, exploring hopes and vulnerabilities, and encouraging self-care.  Also reviewed Advance Directives, which pt has now completed; she plans to seek notary when she will next be on campus.  Following for support, and Natasha Torres plans to reach out as treatment unfolds.  Please also page as needs arise.  Thank you.  [place], [place]

## 2014-10-28 NOTE — Progress Notes (Signed)
Please see the Nurse Progress Note in the MD Initial Consult Encounter for this patient. 

## 2014-11-07 NOTE — Assessment & Plan Note (Signed)
Left breast invasive ductal carcinoma with DCIS, grade 3, ER 100% positive, PR negative, HER-2 amplified ratio 2.6 status post excisional biopsy at [facility] with positive margins, T2/T3 N0 M0 stage II A/II B clinical stage (additional biopsies involving left breast, left axilla and right breast were benign) BRCA1 and 2 negative full panel pending. Biopsy of the right breast mass and left axillary lymph node and the secondary mass left breast back as benign.  Treatment plan: Neoadjuvant chemotherapy with [place] followed by mastectomy with reconstruction with adjuvant maintenance Herceptin, along with anti-estrogen therapy with tamoxifen 10 years Current treatment: Cycle 3 day 1 of TCH Perjeta  Toxicities: 1. Diarrhea and abdominal pain related to Perjeta improved with Imodium, I also recommended that she take Zantac for the stomach pain. 2. Fatigue day 5-7 3. Acneiform rash on the face and chest wall I prescribed clindamycin topical ointment 4. Sore throat, nonproductive cough, mild headache, and occasional nausea  RTC in 3 weeks

## 2014-11-08 ENCOUNTER — Encounter: Payer: Self-pay | Admitting: *Deleted

## 2014-11-08 ENCOUNTER — Ambulatory Visit (HOSPITAL_BASED_OUTPATIENT_CLINIC_OR_DEPARTMENT_OTHER): Payer: Managed Care, Other (non HMO) | Admitting: Hematology and Oncology

## 2014-11-08 ENCOUNTER — Telehealth: Payer: Self-pay | Admitting: *Deleted

## 2014-11-08 ENCOUNTER — Ambulatory Visit: Payer: Managed Care, Other (non HMO)

## 2014-11-08 ENCOUNTER — Ambulatory Visit (HOSPITAL_BASED_OUTPATIENT_CLINIC_OR_DEPARTMENT_OTHER): Payer: Managed Care, Other (non HMO)

## 2014-11-08 ENCOUNTER — Telehealth: Payer: Self-pay | Admitting: Hematology and Oncology

## 2014-11-08 ENCOUNTER — Other Ambulatory Visit (HOSPITAL_BASED_OUTPATIENT_CLINIC_OR_DEPARTMENT_OTHER): Payer: Managed Care, Other (non HMO)

## 2014-11-08 ENCOUNTER — Other Ambulatory Visit: Payer: Self-pay | Admitting: *Deleted

## 2014-11-08 VITALS — BP 116/75 | HR 59 | Temp 98.3°F | Resp 18 | Ht 71.0 in | Wt 174.2 lb

## 2014-11-08 DIAGNOSIS — Z5112 Encounter for antineoplastic immunotherapy: Secondary | ICD-10-CM | POA: Diagnosis not present

## 2014-11-08 DIAGNOSIS — R21 Rash and other nonspecific skin eruption: Secondary | ICD-10-CM | POA: Diagnosis not present

## 2014-11-08 DIAGNOSIS — Z5111 Encounter for antineoplastic chemotherapy: Secondary | ICD-10-CM | POA: Diagnosis not present

## 2014-11-08 DIAGNOSIS — J040 Acute laryngitis: Secondary | ICD-10-CM

## 2014-11-08 DIAGNOSIS — R109 Unspecified abdominal pain: Secondary | ICD-10-CM | POA: Diagnosis not present

## 2014-11-08 DIAGNOSIS — K219 Gastro-esophageal reflux disease without esophagitis: Secondary | ICD-10-CM

## 2014-11-08 DIAGNOSIS — C50412 Malignant neoplasm of upper-outer quadrant of left female breast: Secondary | ICD-10-CM

## 2014-11-08 DIAGNOSIS — R5383 Other fatigue: Secondary | ICD-10-CM

## 2014-11-08 DIAGNOSIS — R635 Abnormal weight gain: Secondary | ICD-10-CM

## 2014-11-08 DIAGNOSIS — R197 Diarrhea, unspecified: Secondary | ICD-10-CM

## 2014-11-08 DIAGNOSIS — Z95828 Presence of other vascular implants and grafts: Secondary | ICD-10-CM

## 2014-11-08 LAB — COMPREHENSIVE METABOLIC PANEL (CC13)
ALT: 29 U/L (ref 0–55)
AST: 31 U/L (ref 5–34)
Albumin: 3.8 g/dL (ref 3.5–5.0)
Alkaline Phosphatase: 66 U/L (ref 40–150)
Anion Gap: 11 mEq/L (ref 3–11)
BUN: 10.5 mg/dL (ref 7.0–26.0)
CO2: 23 meq/L (ref 22–29)
CREATININE: 0.7 mg/dL (ref 0.6–1.1)
Calcium: 9.1 mg/dL (ref 8.4–10.4)
Chloride: 107 mEq/L (ref 98–109)
Glucose: 109 mg/dl (ref 70–140)
POTASSIUM: 3.8 meq/L (ref 3.5–5.1)
Sodium: 140 mEq/L (ref 136–145)
TOTAL PROTEIN: 7 g/dL (ref 6.4–8.3)
Total Bilirubin: 0.38 mg/dL (ref 0.20–1.20)

## 2014-11-08 LAB — CBC WITH DIFFERENTIAL/PLATELET
BASO%: 0.2 % (ref 0.0–2.0)
Basophils Absolute: 0 10*3/uL (ref 0.0–0.1)
EOS%: 0 % (ref 0.0–7.0)
Eosinophils Absolute: 0 10*3/uL (ref 0.0–0.5)
HCT: 33.4 % — ABNORMAL LOW (ref 34.8–46.6)
HGB: 10.9 g/dL — ABNORMAL LOW (ref 11.6–15.9)
LYMPH#: 1.1 10*3/uL (ref 0.9–3.3)
LYMPH%: 19.8 % (ref 14.0–49.7)
MCH: 30.1 pg (ref 25.1–34.0)
MCHC: 32.6 g/dL (ref 31.5–36.0)
MCV: 92.3 fL (ref 79.5–101.0)
MONO#: 0.5 10*3/uL (ref 0.1–0.9)
MONO%: 9.8 % (ref 0.0–14.0)
NEUT%: 70.2 % (ref 38.4–76.8)
NEUTROS ABS: 3.9 10*3/uL (ref 1.5–6.5)
Platelets: 155 10*3/uL (ref 145–400)
RBC: 3.62 10*6/uL — AB (ref 3.70–5.45)
RDW: 14.8 % — ABNORMAL HIGH (ref 11.2–14.5)
WBC: 5.5 10*3/uL (ref 3.9–10.3)

## 2014-11-08 MED ORDER — ACETAMINOPHEN 325 MG PO TABS
ORAL_TABLET | ORAL | Status: AC
  Filled 2014-11-08: qty 2

## 2014-11-08 MED ORDER — SODIUM CHLORIDE 0.9 % IJ SOLN
10.0000 mL | INTRAMUSCULAR | Status: DC | PRN
  Filled 2014-11-08: qty 10

## 2014-11-08 MED ORDER — SODIUM CHLORIDE 0.9 % IJ SOLN
10.0000 mL | INTRAMUSCULAR | Status: DC | PRN
  Administered 2014-11-08: 10 mL via INTRAVENOUS
  Filled 2014-11-08: qty 10

## 2014-11-08 MED ORDER — DIPHENHYDRAMINE HCL 25 MG PO CAPS
ORAL_CAPSULE | ORAL | Status: AC
  Filled 2014-11-08: qty 1

## 2014-11-08 MED ORDER — HEPARIN SOD (PORK) LOCK FLUSH 100 UNIT/ML IV SOLN
500.0000 [IU] | Freq: Once | INTRAVENOUS | Status: DC | PRN
  Filled 2014-11-08: qty 5

## 2014-11-08 MED ORDER — ACETAMINOPHEN 325 MG PO TABS
650.0000 mg | ORAL_TABLET | Freq: Once | ORAL | Status: AC
  Administered 2014-11-08: 650 mg via ORAL

## 2014-11-08 MED ORDER — TRASTUZUMAB CHEMO INJECTION 440 MG
6.0000 mg/kg | Freq: Once | INTRAVENOUS | Status: AC
  Administered 2014-11-08: 462 mg via INTRAVENOUS
  Filled 2014-11-08: qty 22

## 2014-11-08 MED ORDER — DOCETAXEL CHEMO INJECTION 160 MG/16ML
75.0000 mg/m2 | Freq: Once | INTRAVENOUS | Status: AC
  Administered 2014-11-08: 150 mg via INTRAVENOUS
  Filled 2014-11-08: qty 15

## 2014-11-08 MED ORDER — PALONOSETRON HCL INJECTION 0.25 MG/5ML
0.2500 mg | Freq: Once | INTRAVENOUS | Status: AC
  Administered 2014-11-08: 0.25 mg via INTRAVENOUS

## 2014-11-08 MED ORDER — SODIUM CHLORIDE 0.9 % IV SOLN
750.0000 mg | Freq: Once | INTRAVENOUS | Status: AC
  Administered 2014-11-08: 750 mg via INTRAVENOUS
  Filled 2014-11-08: qty 75

## 2014-11-08 MED ORDER — SODIUM CHLORIDE 0.9 % IV SOLN
420.0000 mg | Freq: Once | INTRAVENOUS | Status: AC
  Administered 2014-11-08: 420 mg via INTRAVENOUS
  Filled 2014-11-08: qty 14

## 2014-11-08 MED ORDER — DIPHENHYDRAMINE HCL 25 MG PO CAPS
50.0000 mg | ORAL_CAPSULE | Freq: Once | ORAL | Status: AC
  Administered 2014-11-08: 50 mg via ORAL

## 2014-11-08 MED ORDER — PEGFILGRASTIM 6 MG/0.6ML ~~LOC~~ PSKT
6.0000 mg | PREFILLED_SYRINGE | Freq: Once | SUBCUTANEOUS | Status: AC
  Administered 2014-11-08: 6 mg via SUBCUTANEOUS
  Filled 2014-11-08: qty 0.6

## 2014-11-08 MED ORDER — PALONOSETRON HCL INJECTION 0.25 MG/5ML
INTRAVENOUS | Status: AC
  Filled 2014-11-08: qty 5

## 2014-11-08 MED ORDER — SODIUM CHLORIDE 0.9 % IV SOLN
Freq: Once | INTRAVENOUS | Status: AC
  Administered 2014-11-08: 10:00:00 via INTRAVENOUS

## 2014-11-08 MED ORDER — SODIUM CHLORIDE 0.9 % IV SOLN
Freq: Once | INTRAVENOUS | Status: AC
  Administered 2014-11-08: 10:00:00 via INTRAVENOUS
  Filled 2014-11-08: qty 1

## 2014-11-08 NOTE — Patient Instructions (Signed)
[facility] Discharge Instructions for Patients Receiving Chemotherapy  Today you received the following chemotherapy agents Herceptin/Perjeta/Taxotere/Carboplatin.  To help prevent nausea and vomiting after your treatment, we encourage you to take your nausea medication as directed.   If you develop nausea and vomiting that is not controlled by your nausea medication, call the clinic.   BELOW ARE SYMPTOMS THAT SHOULD BE REPORTED IMMEDIATELY:  *FEVER GREATER THAN 100.5 F  *CHILLS WITH OR WITHOUT FEVER  NAUSEA AND VOMITING THAT IS NOT CONTROLLED WITH YOUR NAUSEA MEDICATION  *UNUSUAL SHORTNESS OF BREATH  *UNUSUAL BRUISING OR BLEEDING  TENDERNESS IN MOUTH AND THROAT WITH OR WITHOUT PRESENCE OF ULCERS  *URINARY PROBLEMS  *BOWEL PROBLEMS  UNUSUAL RASH Items with * indicate a potential emergency and should be followed up as soon as possible.  Feel free to call the clinic you have any questions or concerns. The clinic phone number is [phone].  Please show the [place] at check-in to the Emergency Department and triage nurse.

## 2014-11-08 NOTE — Progress Notes (Signed)
Met with pt during cycle 3. Denies needs. Relate she is doing well. Encourage pt to call with questions or concerns.

## 2014-11-08 NOTE — Telephone Encounter (Signed)
Gave avs & calendar for June. Sent message to schedule treatmnet.

## 2014-11-08 NOTE — Patient Instructions (Signed)

## 2014-11-08 NOTE — Telephone Encounter (Signed)
Per staff message and POF I have scheduled appts. Advised scheduler of appts. JMW  

## 2014-11-08 NOTE — Progress Notes (Signed)
Patient Care Team: Loraine Leriche, MD as PCP - General (Internal Medicine)  DIAGNOSIS: No matching staging information was found for the patient.  SUMMARY OF ONCOLOGIC HISTORY:   Breast cancer of upper-outer quadrant of left female breast   08/27/2014 Initial Diagnosis Excisional biopsy: 2 lumps showing 1.5 cm and 0.9 cm IDC, Grade 3, ER Pos, PR Neg and HER-2 amplified Ratio 2.6, Multifocal   09/27/2014 -  Neo-Adjuvant Chemotherapy Neoadjuvant TCH Perjeta every 3 week 6 followed by Herceptin maintenance    CHIEF COMPLIANT: Cycle 3 TCH Perjeta  INTERVAL HISTORY: Natasha Torres is a 34 year old above-mentioned history of left breast cancer currently neoadjuvant chemotherapy with TCH Perjeta. She is tolerating it fairly well. She does have 5 day history of laryngitis related to severe acid reflux. Her symptoms have improved currently. She reports that she has tremendous appetite and appears to be eating all the time and this is caused her to gain some weight. She is very concerned about her weight issues.  REVIEW OF SYSTEMS:   Constitutional: Denies fevers, chills or abnormal weight loss Eyes: Denies blurriness of vision Ears, nose, mouth, throat, and face: Denies mucositis or sore throat Respiratory: Denies cough, dyspnea or wheezes Cardiovascular: Denies palpitation, chest discomfort or lower extremity swelling Gastrointestinal:  Denies nausea, heartburn or change in bowel habits Skin: Denies abnormal skin rashes Lymphatics: Denies new lymphadenopathy or easy bruising Neurological:Denies numbness, tingling or new weaknesses Behavioral/Psych: Mood is stable, no new changes  All other systems were reviewed with the patient and are negative.  I have reviewed the past medical history, past surgical history, social history and family history with the patient and they are unchanged from previous note.  ALLERGIES:  is allergic to morphine and related and penicillins.  MEDICATIONS:   Current Outpatient Prescriptions  Medication Sig Dispense Refill  . Alum & Mag Hydroxide-Simeth (MAGIC MOUTHWASH W/LIDOCAINE) SOLN Take 5 mLs by mouth 4 (four) times daily as needed for mouth pain ([facility] mouthwash w/ nystatin 1:1). 240 mL 0  . clindamycin (CLINDAGEL) 1 % gel Apply topically 2 (two) times daily. (Patient not taking: Reported on 10/28/2014) 30 g 0  . dexamethasone (DECADRON) 4 MG tablet Take 1 tablet (4 mg total) by mouth 2 (two) times daily. Start the day before Taxotere. Then again the day after chemo for 3 days. (Patient not taking: Reported on 10/28/2014) 30 tablet 1  . escitalopram (LEXAPRO) 10 MG tablet Take 10 mg by mouth daily.    [name] HYDROcodone-acetaminophen (NORCO/VICODIN) 5-325 MG per tablet     . lidocaine-prilocaine (EMLA) cream Apply to affected area once 30 g 3  . loratadine (CLARITIN) 10 MG tablet Take 10 mg by mouth daily.    [name] LORazepam (ATIVAN) 0.5 MG tablet Take 1 tablet (0.5 mg total) by mouth every 6 (six) hours as needed (Nausea or vomiting). 30 tablet 0  . Melatonin 3 MG CAPS Take 1 capsule by mouth 3 (three) times a week.    . Multiple Vitamin (MULTIVITAMIN) tablet Take 1 tablet by mouth daily.    . ondansetron (ZOFRAN) 8 MG tablet Take 1 tablet (8 mg total) by mouth 2 (two) times daily. Start the day after chemo for 3 days. Then take as needed for nausea or vomiting. (Patient not taking: Reported on 10/28/2014) 30 tablet 1  . prochlorperazine (COMPAZINE) 10 MG tablet Take 1 tablet (10 mg total) by mouth every 6 (six) hours as needed (Nausea or vomiting). (Patient not taking: Reported on 10/28/2014) 30 tablet 1  .  sucralfate (CARAFATE) 1 G tablet Take 1 tablet (1 g total) by mouth 4 (four) times daily -  with meals and at bedtime. 40 tablet 1  . UNABLE TO FIND 1 each by Other route daily. 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    PHYSICAL EXAMINATION: ECOG PERFORMANCE STATUS: 1 - Symptomatic but completely ambulatory  Filed Vitals:   11/08/14 0837  BP: 116/75  Pulse: 59  Temp: 98.3 F (36.8 C)  Resp: 18   Filed Weights   11/08/14 0837  Weight: 174 lb 3.2 oz (79.017 kg)    GENERAL:alert, no distress and comfortable SKIN: skin color, texture, turgor are normal, no rashes or significant lesions EYES: normal, Conjunctiva are pink and non-injected, sclera clear OROPHARYNX:no exudate, no erythema and lips, buccal mucosa, and tongue normal  NECK: supple, thyroid normal size, non-tender, without nodularity LYMPH:  no palpable lymphadenopathy in the cervical, axillary or inguinal LUNGS: clear to auscultation and percussion with normal breathing effort HEART: regular rate & rhythm and no murmurs and no lower extremity edema ABDOMEN:abdomen soft, non-tender and normal bowel sounds Musculoskeletal:no cyanosis of digits and no clubbing  NEURO: alert & oriented x 3 with fluent speech, no focal motor/sensory deficits  LABORATORY DATA:  I have reviewed the data as listed   Chemistry      Component Value Date/Time   NA 140 11/08/2014 0809   NA 135 11/29/2011 1615   K 3.8 11/08/2014 0809   K 4.6 11/29/2011 1615   CL 102 11/29/2011 1615   CO2 23 11/08/2014 0809   CO2 24 11/29/2011 1615   BUN 10.5 11/08/2014 0809   BUN 14 11/29/2011 1615   CREATININE 0.7 11/08/2014 0809   CREATININE 0.71 11/29/2011 1615      Component Value Date/Time   CALCIUM 9.1 11/08/2014 0809   CALCIUM 9.2 11/29/2011 1615   ALKPHOS 66 11/08/2014 0809   ALKPHOS 121* 11/29/2011 1615   AST 31 11/08/2014 0809   AST 27 11/29/2011 1615   ALT 29 11/08/2014 0809   ALT 24 11/29/2011 1615   BILITOT 0.38 11/08/2014 0809   BILITOT 0.1* 11/29/2011 1615       Lab Results  Component Value Date   WBC 5.5 11/08/2014   HGB 10.9* 11/08/2014   HCT 33.4* 11/08/2014   MCV 92.3 11/08/2014   PLT 155 11/08/2014   NEUTROABS 3.9  11/08/2014   ASSESSMENT & PLAN:  Breast cancer of upper-outer quadrant of left female breast Left breast invasive ductal carcinoma with DCIS, grade 3, ER 100% positive, PR negative, HER-2 amplified ratio 2.6 status post excisional biopsy at [facility] with positive margins, T2/T3 N0 M0 stage II A/II B clinical stage (additional biopsies involving left breast, left axilla and right breast were benign) BRCA1 and 2 negative full panel pending. Biopsy of the right breast mass and left axillary lymph node and the secondary mass left breast back as benign.  Treatment plan: Neoadjuvant chemotherapy with [place] followed by mastectomy with reconstruction with adjuvant maintenance Herceptin, along with anti-estrogen therapy with tamoxifen 10 years Current treatment: Cycle 3 day 1 of TCH Perjeta  Toxicities: 1. Diarrhea and abdominal pain related to Perjeta improved with Imodium, I also recommended that she take Zantac for the stomach pain. 2. Fatigue day 5-7 3. Acneiform rash on the face and chest wall I prescribed clindamycin topical ointment 4. Sore throat, nonproductive  cough, mild headache, and occasional nausea 5. Severe acid reflux and weight gain: I discontinued oral dexamethasone therapy  RTC in 3 weeks  No orders of the defined types were placed in this encounter.   The patient has a good understanding of the overall plan. she agrees with it. she will call with any problems that may develop before the next visit here.   Rulon Eisenmenger, MD

## 2014-11-24 ENCOUNTER — Other Ambulatory Visit: Payer: Self-pay | Admitting: Hematology and Oncology

## 2014-11-24 DIAGNOSIS — C50412 Malignant neoplasm of upper-outer quadrant of left female breast: Secondary | ICD-10-CM

## 2014-11-29 ENCOUNTER — Ambulatory Visit (HOSPITAL_BASED_OUTPATIENT_CLINIC_OR_DEPARTMENT_OTHER): Payer: Managed Care, Other (non HMO)

## 2014-11-29 ENCOUNTER — Ambulatory Visit: Payer: Managed Care, Other (non HMO)

## 2014-11-29 ENCOUNTER — Encounter: Payer: Self-pay | Admitting: *Deleted

## 2014-11-29 ENCOUNTER — Encounter: Payer: Self-pay | Admitting: Oncology

## 2014-11-29 ENCOUNTER — Other Ambulatory Visit (HOSPITAL_BASED_OUTPATIENT_CLINIC_OR_DEPARTMENT_OTHER): Payer: Managed Care, Other (non HMO)

## 2014-11-29 ENCOUNTER — Telehealth: Payer: Self-pay | Admitting: Oncology

## 2014-11-29 ENCOUNTER — Ambulatory Visit (HOSPITAL_BASED_OUTPATIENT_CLINIC_OR_DEPARTMENT_OTHER): Payer: Managed Care, Other (non HMO) | Admitting: Oncology

## 2014-11-29 ENCOUNTER — Ambulatory Visit (HOSPITAL_COMMUNITY)
Admission: RE | Admit: 2014-11-29 | Discharge: 2014-11-29 | Disposition: A | Discharge status: Home / Self Care | Payer: Managed Care, Other (non HMO) | Source: Ambulatory Visit | Attending: Oncology | Admitting: Oncology

## 2014-11-29 VITALS — BP 117/61 | HR 66 | Temp 97.7°F | Resp 18 | Ht 71.0 in | Wt 178.0 lb

## 2014-11-29 DIAGNOSIS — Z5111 Encounter for antineoplastic chemotherapy: Secondary | ICD-10-CM

## 2014-11-29 DIAGNOSIS — M25511 Pain in right shoulder: Secondary | ICD-10-CM | POA: Insufficient documentation

## 2014-11-29 DIAGNOSIS — C50412 Malignant neoplasm of upper-outer quadrant of left female breast: Secondary | ICD-10-CM

## 2014-11-29 DIAGNOSIS — R109 Unspecified abdominal pain: Secondary | ICD-10-CM

## 2014-11-29 DIAGNOSIS — Z5112 Encounter for antineoplastic immunotherapy: Secondary | ICD-10-CM

## 2014-11-29 DIAGNOSIS — Z95828 Presence of other vascular implants and grafts: Secondary | ICD-10-CM

## 2014-11-29 DIAGNOSIS — R5383 Other fatigue: Secondary | ICD-10-CM

## 2014-11-29 DIAGNOSIS — R21 Rash and other nonspecific skin eruption: Secondary | ICD-10-CM | POA: Diagnosis not present

## 2014-11-29 DIAGNOSIS — G4700 Insomnia, unspecified: Secondary | ICD-10-CM

## 2014-11-29 DIAGNOSIS — R197 Diarrhea, unspecified: Secondary | ICD-10-CM

## 2014-11-29 DIAGNOSIS — K219 Gastro-esophageal reflux disease without esophagitis: Secondary | ICD-10-CM

## 2014-11-29 LAB — COMPREHENSIVE METABOLIC PANEL (CC13)
ALT: 14 U/L (ref 0–55)
AST: 21 U/L (ref 5–34)
Albumin: 3.5 g/dL (ref 3.5–5.0)
Alkaline Phosphatase: 70 U/L (ref 40–150)
Anion Gap: 9 mEq/L (ref 3–11)
BILIRUBIN TOTAL: 0.24 mg/dL (ref 0.20–1.20)
BUN: 13.2 mg/dL (ref 7.0–26.0)
CO2: 23 mEq/L (ref 22–29)
Calcium: 9.2 mg/dL (ref 8.4–10.4)
Chloride: 108 mEq/L (ref 98–109)
Creatinine: 0.8 mg/dL (ref 0.6–1.1)
EGFR: >90 mL/min/{1.73_m2} (ref >90)
GLUCOSE: 150 mg/dL — AB (ref 70–140)
Potassium: 3.7 mEq/L (ref 3.5–5.1)
Sodium: 140 mEq/L (ref 136–145)
Total Protein: 6.6 g/dL (ref 6.4–8.3)

## 2014-11-29 LAB — CBC WITH DIFFERENTIAL/PLATELET
BASO%: 0.1 % (ref 0.0–2.0)
Basophils Absolute: 0 10*3/uL (ref 0.0–0.1)
EOS%: 0 % (ref 0.0–7.0)
Eosinophils Absolute: 0 10*3/uL (ref 0.0–0.5)
HEMATOCRIT: 31.8 % — AB (ref 34.8–46.6)
HGB: 10.5 g/dL — ABNORMAL LOW (ref 11.6–15.9)
LYMPH#: 0.8 10*3/uL — AB (ref 0.9–3.3)
LYMPH%: 11.1 % — AB (ref 14.0–49.7)
MCH: 30.9 pg (ref 25.1–34.0)
MCHC: 33 g/dL (ref 31.5–36.0)
MCV: 93.5 fL (ref 79.5–101.0)
MONO#: 0.5 10*3/uL (ref 0.1–0.9)
MONO%: 7.2 % (ref 0.0–14.0)
NEUT%: 81.6 % — AB (ref 38.4–76.8)
NEUTROS ABS: 6.2 10*3/uL (ref 1.5–6.5)
PLATELETS: 190 10*3/uL (ref 145–400)
RBC: 3.4 10*6/uL — AB (ref 3.70–5.45)
RDW: 15.1 % — ABNORMAL HIGH (ref 11.2–14.5)
WBC: 7.6 10*3/uL (ref 3.9–10.3)

## 2014-11-29 MED ORDER — SODIUM CHLORIDE 0.9 % IJ SOLN
10.0000 mL | INTRAMUSCULAR | Status: DC | PRN
  Administered 2014-11-29: 10 mL
  Filled 2014-11-29: qty 10

## 2014-11-29 MED ORDER — SODIUM CHLORIDE 0.9 % IV SOLN
420.0000 mg | Freq: Once | INTRAVENOUS | Status: AC
  Administered 2014-11-29: 420 mg via INTRAVENOUS
  Filled 2014-11-29: qty 14

## 2014-11-29 MED ORDER — SODIUM CHLORIDE 0.9 % IV SOLN
Freq: Once | INTRAVENOUS | Status: AC
  Administered 2014-11-29: 10:00:00 via INTRAVENOUS
  Filled 2014-11-29: qty 1

## 2014-11-29 MED ORDER — ACETAMINOPHEN 325 MG PO TABS
ORAL_TABLET | ORAL | Status: AC
  Filled 2014-11-29: qty 2

## 2014-11-29 MED ORDER — TRASTUZUMAB CHEMO INJECTION 440 MG
6.0000 mg/kg | Freq: Once | INTRAVENOUS | Status: AC
  Administered 2014-11-29: 462 mg via INTRAVENOUS
  Filled 2014-11-29: qty 22

## 2014-11-29 MED ORDER — SODIUM CHLORIDE 0.9 % IV SOLN
Freq: Once | INTRAVENOUS | Status: AC
  Administered 2014-11-29: 10:00:00 via INTRAVENOUS

## 2014-11-29 MED ORDER — GOSERELIN ACETATE 3.6 MG ~~LOC~~ IMPL
3.6000 mg | DRUG_IMPLANT | Freq: Once | SUBCUTANEOUS | Status: AC
  Administered 2014-11-29: 3.6 mg via SUBCUTANEOUS
  Filled 2014-11-29: qty 3.6

## 2014-11-29 MED ORDER — CARBOPLATIN CHEMO INJECTION 600 MG/60ML
750.0000 mg | Freq: Once | INTRAVENOUS | Status: AC
  Administered 2014-11-29: 750 mg via INTRAVENOUS
  Filled 2014-11-29: qty 75

## 2014-11-29 MED ORDER — HEPARIN SOD (PORK) LOCK FLUSH 100 UNIT/ML IV SOLN
500.0000 [IU] | Freq: Once | INTRAVENOUS | Status: AC | PRN
  Administered 2014-11-29: 14:00:00
  Filled 2014-11-29: qty 5

## 2014-11-29 MED ORDER — ACETAMINOPHEN 325 MG PO TABS
650.0000 mg | ORAL_TABLET | Freq: Once | ORAL | Status: AC
  Administered 2014-11-29: 650 mg via ORAL

## 2014-11-29 MED ORDER — DIPHENHYDRAMINE HCL 25 MG PO CAPS
ORAL_CAPSULE | ORAL | Status: AC
  Filled 2014-11-29: qty 2

## 2014-11-29 MED ORDER — DIPHENHYDRAMINE HCL 25 MG PO CAPS
50.0000 mg | ORAL_CAPSULE | Freq: Once | ORAL | Status: AC
  Administered 2014-11-29: 50 mg via ORAL

## 2014-11-29 MED ORDER — HEPARIN SOD (PORK) LOCK FLUSH 100 UNIT/ML IV SOLN
500.0000 [IU] | Freq: Once | INTRAVENOUS | Status: DC
  Filled 2014-11-29: qty 5

## 2014-11-29 MED ORDER — PALONOSETRON HCL INJECTION 0.25 MG/5ML
INTRAVENOUS | Status: AC
  Filled 2014-11-29: qty 5

## 2014-11-29 MED ORDER — SODIUM CHLORIDE 0.9 % IJ SOLN
10.0000 mL | INTRAMUSCULAR | Status: DC | PRN
  Filled 2014-11-29: qty 10

## 2014-11-29 MED ORDER — SODIUM CHLORIDE 0.9 % IJ SOLN
10.0000 mL | INTRAMUSCULAR | Status: DC | PRN
  Administered 2014-11-29: 10 mL via INTRAVENOUS
  Filled 2014-11-29: qty 10

## 2014-11-29 MED ORDER — PALONOSETRON HCL INJECTION 0.25 MG/5ML
0.2500 mg | Freq: Once | INTRAVENOUS | Status: AC
  Administered 2014-11-29: 0.25 mg via INTRAVENOUS

## 2014-11-29 MED ORDER — DOCETAXEL CHEMO INJECTION 160 MG/16ML
75.0000 mg/m2 | Freq: Once | INTRAVENOUS | Status: AC
  Administered 2014-11-29: 150 mg via INTRAVENOUS
  Filled 2014-11-29: qty 15

## 2014-11-29 MED ORDER — ZOLPIDEM TARTRATE 5 MG PO TABS: 5.0000 mg | ORAL_TABLET | Freq: Every evening | ORAL | Status: DC | PRN

## 2014-11-29 MED ORDER — PEGFILGRASTIM 6 MG/0.6ML ~~LOC~~ PSKT
6.0000 mg | PREFILLED_SYRINGE | Freq: Once | SUBCUTANEOUS | Status: AC
  Administered 2014-11-29: 6 mg via SUBCUTANEOUS
  Filled 2014-11-29: qty 0.6

## 2014-11-29 NOTE — Telephone Encounter (Signed)
Gave avs & calendar for July. Patient going to have CXR

## 2014-11-29 NOTE — Patient Instructions (Addendum)
[facility] Discharge Instructions for Patients Receiving Chemotherapy  Today you received the following chemotherapy agents Herceptin/Perjeta/Taxotere/Carboplatin.  To help prevent nausea and vomiting after your treatment, we encourage you to take your nausea medication as directed.   If you develop nausea and vomiting that is not controlled by your nausea medication, call the clinic.   BELOW ARE SYMPTOMS THAT SHOULD BE REPORTED IMMEDIATELY:  *FEVER GREATER THAN 100.5 F  *CHILLS WITH OR WITHOUT FEVER  NAUSEA AND VOMITING THAT IS NOT CONTROLLED WITH YOUR NAUSEA MEDICATION  *UNUSUAL SHORTNESS OF BREATH  *UNUSUAL BRUISING OR BLEEDING  TENDERNESS IN MOUTH AND THROAT WITH OR WITHOUT PRESENCE OF ULCERS  *URINARY PROBLEMS  *BOWEL PROBLEMS  UNUSUAL RASH Items with * indicate a potential emergency and should be followed up as soon as possible.  Feel free to call the clinic you have any questions or concerns. The clinic phone number is [phone].  Please show the [facility] at check-in to the Emergency Department and triage nurse.   Fulvestrant injection What is this medicine? FULVESTRANT (ful VES trant) blocks the effects of estrogen. It is used to treat breast cancer in women past the age of menopause. This medicine may be used for other purposes; ask your health care provider or pharmacist if you have questions. COMMON BRAND NAME(S): FASLODEX What should I tell my health care provider before I take this medicine? They need to know if you have any of these conditions: -bleeding problems -liver disease -low levels of platelets in the blood -an unusual or allergic reaction to fulvestrant, other medicines, foods, dyes, or preservatives -pregnant or trying to get pregnant -breast-feeding How should I use this medicine? This medicine is for injection into a muscle. It is usually given by a health care professional in a hospital or clinic setting. Talk to  your pediatrician regarding the use of this medicine in children. Special care may be needed. Overdosage: If you think you have taken too much of this medicine contact a poison control center or emergency room at once. NOTE: This medicine is only for you. Do not share this medicine with others. What if I miss a dose? It is important not to miss your dose. Call your doctor or health care professional if you are unable to keep an appointment. What may interact with this medicine? -medicines that treat or prevent blood clots like warfarin, enoxaparin, and dalteparin This list may not describe all possible interactions. Give your health care provider a list of all the medicines, herbs, non-prescription drugs, or dietary supplements you use. Also tell them if you smoke, drink alcohol, or use illegal drugs. Some items may interact with your medicine. What should I watch for while using this medicine? Your condition will be monitored carefully while you are receiving this medicine. You will need important blood work done while you are taking this medicine. Do not become pregnant while taking this medicine. Women should inform their doctor if they wish to become pregnant or think they might be pregnant. There is a potential for serious side effects to an unborn child. Talk to your health care professional or pharmacist for more information. What side effects may I notice from receiving this medicine? Side effects that you should report to your doctor or health care professional as soon as possible: -allergic reactions like skin rash, itching or hives, swelling of the face, lips, or tongue -feeling faint or lightheaded, falls -fever or flu-like symptoms -sore throat -vaginal bleeding Side effects that  usually do not require medical attention (report to your doctor or health care professional if they continue or are bothersome): -aches, pains -constipation or diarrhea -headache -hot flashes -nausea,  vomiting -pain at site where injected -stomach pain This list may not describe all possible side effects. Call your doctor for medical advice about side effects. You may report side effects to FDA at 1-800-FDA-1088. Where should I keep my medicine? This drug is given in a hospital or clinic and will not be stored at home. NOTE: This sheet is a summary. It may not cover all possible information. If you have questions about this medicine, talk to your doctor, pharmacist, or health care provider.  2015, [employer]vier/Gold Standard. (2007-10-13 15:39:24)

## 2014-11-29 NOTE — Telephone Encounter (Signed)
Appointments made and avs will be printed in chemo  °

## 2014-11-29 NOTE — Patient Instructions (Signed)

## 2014-11-29 NOTE — Progress Notes (Signed)
Lm on vm for patient to call me: per kristin curcio.

## 2014-11-29 NOTE — Progress Notes (Signed)
Oncology Nurse Navigator Documentation  Oncology Nurse Navigator Flowsheets 11/29/2014  Navigator Encounter Type Treatment  Patient Visit Type Medonc  Treatment Phase Treatment  Barriers/Navigation Needs No barriers at this time  Time Spent with Patient 15   Met pt during chemo treatment. Relate doing well and without complaints at this. Encourage pt to call with questions or concerns. Received verbal understanding.

## 2014-11-29 NOTE — Progress Notes (Signed)
Patient Care Team: Loraine Leriche, MD as PCP - General (Internal Medicine)  DIAGNOSIS: No matching staging information was found for the patient.  SUMMARY OF ONCOLOGIC HISTORY:   Breast cancer of upper-outer quadrant of left female breast   08/27/2014 Initial Diagnosis Excisional biopsy: 2 lumps showing 1.5 cm and 0.9 cm IDC, Grade 3, ER Pos, PR Neg and HER-2 amplified Ratio 2.6, Multifocal   09/27/2014 -  Neo-Adjuvant Chemotherapy Neoadjuvant TCH Perjeta every 3 week 6 followed by Herceptin maintenance    CHIEF COMPLIANT: Cycle 4 TCH Perjeta  INTERVAL HISTORY: Natasha Torres is a 34 year old above-mentioned history of left breast cancer currently neoadjuvant chemotherapy with TCH Perjeta. She is tolerating it fairly well. Acid reflux symptoms have resolved without treatment. The diarrhea from the project that is better. This seems to come and go. She takes Imodium on occasion. Reports pain to her right shoulder that has been present for about 10 days. She feels as though her right shoulder is inflamed at times. It is better today however. She has taken hydrocodone on medication for this pain. Her Port-A-Cath is in her right chest and is giving a blood return without any difficulty. She also reports difficulty sleeping. She has been using Ativan which is no longer working. She is asking what else she can use to help her sleep.  REVIEW OF SYSTEMS:   Constitutional: Denies fevers, chills or abnormal weight loss Eyes: Denies blurriness of vision Ears, nose, mouth, throat, and face: Denies mucositis or sore throat Respiratory: Denies cough, dyspnea or wheezes Cardiovascular: Denies palpitation, chest discomfort or lower extremity swelling Gastrointestinal:  Denies nausea, heartburn or change in bowel habits Skin: Denies abnormal skin rashes Lymphatics: Denies new lymphadenopathy or easy bruising Neurological:Denies numbness, tingling or new weaknesses Behavioral/Psych: Mood is stable,  no new changes  All other systems were reviewed with the patient and are negative.  I have reviewed the past medical history, past surgical history, social history and family history with the patient and they are unchanged from previous note.  ALLERGIES:  is allergic to morphine and related and penicillins.  MEDICATIONS:  Current Outpatient Prescriptions  Medication Sig Dispense Refill  . Alum & Mag Hydroxide-Simeth (MAGIC MOUTHWASH W/LIDOCAINE) SOLN Take 5 mLs by mouth 4 (four) times daily as needed for mouth pain ([facility] mouthwash w/ nystatin 1:1). 240 mL 0  . clindamycin (CLINDAGEL) 1 % gel Apply topically 2 (two) times daily. 30 g 0  . dexamethasone (DECADRON) 4 MG tablet Take 1 tablet (4 mg total) by mouth 2 (two) times daily. Start the day before Taxotere. Then again the day after chemo for 3 days. 30 tablet 1  . escitalopram (LEXAPRO) 10 MG tablet Take 10 mg by mouth daily.    [name] HYDROcodone-acetaminophen (NORCO/VICODIN) 5-325 MG per tablet     . lidocaine-prilocaine (EMLA) cream Apply to affected area once 30 g 3  . loratadine (CLARITIN) 10 MG tablet Take 10 mg by mouth daily.    [name] LORazepam (ATIVAN) 0.5 MG tablet TAKE ONE TABLET BY MOUTH EVERY SIX HOURS AS NEEDED 30 tablet 0  . Melatonin 3 MG CAPS Take 1 capsule by mouth 3 (three) times a week.    . Multiple Vitamin (MULTIVITAMIN) tablet Take 1 tablet by mouth daily.    . ondansetron (ZOFRAN) 8 MG tablet Take 1 tablet (8 mg total) by mouth 2 (two) times daily. Start the day after chemo for 3 days. Then take as needed for nausea or vomiting. 30 tablet 1  .  prochlorperazine (COMPAZINE) 10 MG tablet Take 1 tablet (10 mg total) by mouth every 6 (six) hours as needed (Nausea or vomiting). 30 tablet 1  . sucralfate (CARAFATE) 1 G tablet Take 1 tablet (1 g total) by mouth 4 (four) times daily -  with meals and at bedtime. 40 tablet 1  . UNABLE TO FIND 1 each by Other route daily. 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 zolpidem (AMBIEN) 5 MG tablet Take 1 tablet (5 mg total) by mouth at bedtime as needed for sleep. 30 tablet 0   No current facility-administered medications for this visit.    PHYSICAL EXAMINATION: ECOG PERFORMANCE STATUS: 1 - Symptomatic but completely ambulatory  Filed Vitals:   11/29/14 0902  BP: 117/61  Pulse: 66  Temp: 97.7 F (36.5 C)  Resp: 18   Filed Weights   11/29/14 0902  Weight: 178 lb (80.74 kg)    GENERAL:alert, no distress and comfortable SKIN: skin color, texture, turgor are normal, no rashes or significant lesions EYES: normal, Conjunctiva are pink and non-injected, sclera clear OROPHARYNX:no exudate, no erythema and lips, buccal mucosa, and tongue normal  NECK: supple, thyroid normal size, non-tender, without nodularity LYMPH:  no palpable lymphadenopathy in the cervical, axillary or inguinal LUNGS: clear to auscultation and percussion with normal breathing effort HEART: regular rate & rhythm and no murmurs and no lower extremity edema ABDOMEN:abdomen soft, non-tender and normal bowel sounds Musculoskeletal:no cyanosis of digits and no clubbing  NEURO: alert & oriented x 3 with fluent speech, no focal motor/sensory deficits CHEST: Port-A-Cath is in the right chest without any redness or edema. No tenderness with palpation. I am unable to detect any obvious abnormality on exam.  LABORATORY DATA:  I have reviewed the data as listed   Chemistry      Component Value Date/Time   NA 140 11/29/2014 0822   NA 135 11/29/2011 1615   K 3.7 11/29/2014 0822   K 4.6 11/29/2011 1615   CL 102 11/29/2011 1615   CO2 23 11/29/2014 0822   CO2 24 11/29/2011 1615   BUN 13.2 11/29/2014 0822   BUN 14 11/29/2011 1615   CREATININE 0.8 11/29/2014 0822   CREATININE 0.71 11/29/2011 1615      Component Value Date/Time   CALCIUM 9.2 11/29/2014 0822   CALCIUM 9.2 11/29/2011 1615   ALKPHOS 70 11/29/2014 0822    ALKPHOS 121* 11/29/2011 1615   AST 21 11/29/2014 0822   AST 27 11/29/2011 1615   ALT 14 11/29/2014 0822   ALT 24 11/29/2011 1615   BILITOT 0.24 11/29/2014 0822   BILITOT 0.1* 11/29/2011 1615       Lab Results  Component Value Date   WBC 7.6 11/29/2014   HGB 10.5* 11/29/2014   HCT 31.8* 11/29/2014   MCV 93.5 11/29/2014   PLT 190 11/29/2014   NEUTROABS 6.2 11/29/2014   ASSESSMENT & PLAN:  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 Biopsy of the right breast mass and left axillary lymph node and the secondary mass left breast back as benign.  Treatment plan: Neoadjuvant chemotherapy  with [place] followed by mastectomy with reconstruction with adjuvant maintenance Herceptin, along with anti-estrogen therapy with tamoxifen 10 years Current treatment: Cycle 4 day 1 of TCH Perjeta  Toxicities: 1. Diarrhea and abdominal pain related to Perjeta improved with Imodium. Reflux symptoms are better without treatment, but she has Zantac at home if she needs it. 2. Fatigue day 5-7 3. Acneiform rash on the face and chest wall I prescribed clindamycin topical ointment 4. Sore throat, nonproductive cough, mild headache, and occasional nausea.  5. Severe acid reflux and weight gain: She is off of oral dexamethasone therapy 6. Right shoulder pain. Given that her Port-A-Cath is in the right side of her chest we will go ahead and get a chest x-ray to evaluate the Port-A-Cath and her right shoulder. Advised her that she may use ibuprofen 400 mg every 6 hours as needed for pain. She is a take this with food. Further recommendations pending  the chest x-ray results. 7. Insomnia. She is currently taking Ativan 0.5 mg at bedtime. We discussed alternatives and she does not like take Benadryl due to excessive drowsiness the next day. We discussed the use of Ambien and she states she does not really like to take this very much. For the short term, I recommended that she use 1-2 tablets of Ativan at bedtime. I have cautioned her that this is indicative however and she is to use this sparingly. I have also given a protrusion for Ambien to use if the increased dose of Ativan does not work. I have explained to her that she is not to use Ativan and Ambien at the same time.  RTC in 3 weeks  Orders Placed This Encounter  Procedures  . DG Chest 2 View    Standing Status: Future     Number of Occurrences: 1     Standing Expiration Date: 11/29/2015    Order Specific Question:  Reason for exam:    Answer:  Right shoulder pain. Has PAC on right side. Please eval.    Order Specific Question:  Preferred imaging location?    Answer:  [facility]   The patient has a good understanding of the overall plan. she agrees with it. she will call with any problems that may develop before the next visit here.   Mikey Bussing, NP

## 2014-11-30 ENCOUNTER — Telehealth: Payer: Self-pay | Admitting: *Deleted

## 2014-11-30 NOTE — Telephone Encounter (Signed)
-----   Message from Maryanna Shape, NP sent at 11/29/2014  3:39 PM EDT ----- Natasha Torres Please call the patient and let her know that her chest x-ray is normal. Her Port-A-Cath is in good position and there is no bone abnormality. I suspect that her pain may be musculoskeletal in origin. She should try alternating heat and ice and she may use ibuprofen 400 mg every 6 hours as needed. Take with food. She should try to use her arm more to stretch the muscles.

## 2014-11-30 NOTE — Telephone Encounter (Signed)
Spoke with patient. Gave results of chest x-ray, instructed her to use heat alternating with ice to affected shoulder, take ibuprofen 400 mg with food every 6 hours prn. ty to use right arm more to stretch the muscles. Patient verbalized understanding.

## 2014-12-02 ENCOUNTER — Other Ambulatory Visit: Payer: Self-pay

## 2014-12-02 DIAGNOSIS — C50412 Malignant neoplasm of upper-outer quadrant of left female breast: Secondary | ICD-10-CM

## 2014-12-03 ENCOUNTER — Telehealth: Payer: Self-pay | Admitting: Hematology and Oncology

## 2014-12-03 NOTE — Telephone Encounter (Signed)
Spoke with patient and she is aware of her echo appointment °

## 2014-12-08 ENCOUNTER — Other Ambulatory Visit: Payer: Self-pay | Admitting: Hematology and Oncology

## 2014-12-08 ENCOUNTER — Other Ambulatory Visit: Payer: Self-pay | Admitting: *Deleted

## 2014-12-08 DIAGNOSIS — C50412 Malignant neoplasm of upper-outer quadrant of left female breast: Secondary | ICD-10-CM

## 2014-12-15 ENCOUNTER — Other Ambulatory Visit: Payer: Self-pay

## 2014-12-15 ENCOUNTER — Ambulatory Visit (HOSPITAL_COMMUNITY)
Admission: RE | Admit: 2014-12-15 | Discharge: 2014-12-15 | Disposition: A | Discharge status: Home / Self Care | Payer: Managed Care, Other (non HMO) | Source: Ambulatory Visit | Attending: Hematology and Oncology | Admitting: Hematology and Oncology

## 2014-12-15 ENCOUNTER — Telehealth: Payer: Self-pay | Admitting: *Deleted

## 2014-12-15 DIAGNOSIS — C50412 Malignant neoplasm of upper-outer quadrant of left female breast: Secondary | ICD-10-CM | POA: Diagnosis not present

## 2014-12-15 DIAGNOSIS — Z01818 Encounter for other preprocedural examination: Secondary | ICD-10-CM | POA: Insufficient documentation

## 2014-12-15 DIAGNOSIS — C50919 Malignant neoplasm of unspecified site of unspecified female breast: Secondary | ICD-10-CM

## 2014-12-15 NOTE — Telephone Encounter (Signed)
VM message received @ 3:32 pm  from patient requesting a call from Dr. Lindi Adie regarding her cardiac echo results from today.

## 2014-12-15 NOTE — Progress Notes (Signed)
  Echocardiogram 2D Echocardiogram has been performed.  Joelene Millin 12/15/2014, 10:40 AM

## 2014-12-17 ENCOUNTER — Other Ambulatory Visit: Payer: Self-pay | Admitting: Hematology and Oncology

## 2014-12-17 NOTE — Telephone Encounter (Signed)
Active treatment  

## 2014-12-21 ENCOUNTER — Telehealth: Payer: Self-pay | Admitting: Hematology and Oncology

## 2014-12-21 ENCOUNTER — Ambulatory Visit (HOSPITAL_BASED_OUTPATIENT_CLINIC_OR_DEPARTMENT_OTHER): Payer: Managed Care, Other (non HMO)

## 2014-12-21 ENCOUNTER — Other Ambulatory Visit: Payer: Self-pay | Admitting: *Deleted

## 2014-12-21 ENCOUNTER — Encounter: Payer: Self-pay | Admitting: Hematology and Oncology

## 2014-12-21 ENCOUNTER — Encounter: Payer: Self-pay | Admitting: *Deleted

## 2014-12-21 ENCOUNTER — Other Ambulatory Visit: Payer: Self-pay

## 2014-12-21 ENCOUNTER — Other Ambulatory Visit (HOSPITAL_BASED_OUTPATIENT_CLINIC_OR_DEPARTMENT_OTHER): Payer: Managed Care, Other (non HMO)

## 2014-12-21 ENCOUNTER — Ambulatory Visit: Payer: Managed Care, Other (non HMO)

## 2014-12-21 ENCOUNTER — Ambulatory Visit (HOSPITAL_BASED_OUTPATIENT_CLINIC_OR_DEPARTMENT_OTHER): Payer: Managed Care, Other (non HMO) | Admitting: Hematology and Oncology

## 2014-12-21 VITALS — BP 123/75 | HR 60 | Temp 98.5°F | Resp 18 | Ht 71.0 in | Wt 183.5 lb

## 2014-12-21 DIAGNOSIS — Z95828 Presence of other vascular implants and grafts: Secondary | ICD-10-CM

## 2014-12-21 DIAGNOSIS — Z5189 Encounter for other specified aftercare: Secondary | ICD-10-CM | POA: Diagnosis not present

## 2014-12-21 DIAGNOSIS — C50412 Malignant neoplasm of upper-outer quadrant of left female breast: Secondary | ICD-10-CM

## 2014-12-21 DIAGNOSIS — Z5111 Encounter for antineoplastic chemotherapy: Secondary | ICD-10-CM

## 2014-12-21 DIAGNOSIS — T829XXA Unspecified complication of cardiac and vascular prosthetic device, implant and graft, initial encounter: Secondary | ICD-10-CM

## 2014-12-21 LAB — COMPREHENSIVE METABOLIC PANEL (CC13)
ALT: 25 U/L (ref 0–55)
ANION GAP: 8 meq/L (ref 3–11)
AST: 24 U/L (ref 5–34)
Albumin: 3.7 g/dL (ref 3.5–5.0)
Alkaline Phosphatase: 61 U/L (ref 40–150)
BUN: 11 mg/dL (ref 7.0–26.0)
CHLORIDE: 111 meq/L — AB (ref 98–109)
CO2: 23 meq/L (ref 22–29)
Calcium: 9.3 mg/dL (ref 8.4–10.4)
Creatinine: 0.7 mg/dL (ref 0.6–1.1)
EGFR: >90 mL/min/{1.73_m2} (ref >90)
GLUCOSE: 94 mg/dL (ref 70–140)
Potassium: 3.5 mEq/L (ref 3.5–5.1)
Sodium: 141 mEq/L (ref 136–145)
TOTAL PROTEIN: 6.4 g/dL (ref 6.4–8.3)
Total Bilirubin: 0.28 mg/dL (ref 0.20–1.20)

## 2014-12-21 LAB — CBC WITH DIFFERENTIAL/PLATELET
BASO%: 0.4 % (ref 0.0–2.0)
BASOS ABS: 0 10*3/uL (ref 0.0–0.1)
EOS%: 0 % (ref 0.0–7.0)
Eosinophils Absolute: 0 10*3/uL (ref 0.0–0.5)
HCT: 31 % — ABNORMAL LOW (ref 34.8–46.6)
HGB: 10.3 g/dL — ABNORMAL LOW (ref 11.6–15.9)
LYMPH%: 22.4 % (ref 14.0–49.7)
MCH: 31.5 pg (ref 25.1–34.0)
MCHC: 33.3 g/dL (ref 31.5–36.0)
MCV: 94.5 fL (ref 79.5–101.0)
MONO#: 0.8 10*3/uL (ref 0.1–0.9)
MONO%: 11 % (ref 0.0–14.0)
NEUT%: 66.2 % (ref 38.4–76.8)
NEUTROS ABS: 4.9 10*3/uL (ref 1.5–6.5)
PLATELETS: 150 10*3/uL (ref 145–400)
RBC: 3.28 10*6/uL — AB (ref 3.70–5.45)
RDW: 16.6 % — AB (ref 11.2–14.5)
WBC: 7.4 10*3/uL (ref 3.9–10.3)
lymph#: 1.7 10*3/uL (ref 0.9–3.3)

## 2014-12-21 MED ORDER — PALONOSETRON HCL INJECTION 0.25 MG/5ML
INTRAVENOUS | Status: AC
  Filled 2014-12-21: qty 5

## 2014-12-21 MED ORDER — DIPHENHYDRAMINE HCL 25 MG PO CAPS
50.0000 mg | ORAL_CAPSULE | Freq: Once | ORAL | Status: AC
  Administered 2014-12-21: 50 mg via ORAL

## 2014-12-21 MED ORDER — SODIUM CHLORIDE 0.9 % IV SOLN
Freq: Once | INTRAVENOUS | Status: AC
  Administered 2014-12-21: 12:00:00 via INTRAVENOUS

## 2014-12-21 MED ORDER — SODIUM CHLORIDE 0.9 % IV SOLN
750.0000 mg | Freq: Once | INTRAVENOUS | Status: AC
  Administered 2014-12-21: 750 mg via INTRAVENOUS
  Filled 2014-12-21: qty 75

## 2014-12-21 MED ORDER — LORAZEPAM 0.5 MG PO TABS: 0.5000 mg | ORAL_TABLET | Freq: Three times a day (TID) | ORAL | Status: DC | PRN

## 2014-12-21 MED ORDER — DIPHENHYDRAMINE HCL 25 MG PO CAPS
ORAL_CAPSULE | ORAL | Status: AC
  Filled 2014-12-21: qty 1

## 2014-12-21 MED ORDER — SODIUM CHLORIDE 0.9 % IJ SOLN
10.0000 mL | INTRAMUSCULAR | Status: DC | PRN
  Administered 2014-12-21: 10 mL via INTRAVENOUS
  Filled 2014-12-21: qty 10

## 2014-12-21 MED ORDER — DEXTROSE 5 % IV SOLN
75.0000 mg/m2 | Freq: Once | INTRAVENOUS | Status: AC
  Administered 2014-12-21: 150 mg via INTRAVENOUS
  Filled 2014-12-21: qty 15

## 2014-12-21 MED ORDER — PEGFILGRASTIM 6 MG/0.6ML ~~LOC~~ PSKT
6.0000 mg | PREFILLED_SYRINGE | Freq: Once | SUBCUTANEOUS | Status: AC
  Administered 2014-12-21: 6 mg via SUBCUTANEOUS
  Filled 2014-12-21: qty 0.6

## 2014-12-21 MED ORDER — SODIUM CHLORIDE 0.9 % IV SOLN
Freq: Once | INTRAVENOUS | Status: AC
  Administered 2014-12-21: 12:00:00 via INTRAVENOUS
  Filled 2014-12-21: qty 1

## 2014-12-21 MED ORDER — PALONOSETRON HCL INJECTION 0.25 MG/5ML
0.2500 mg | Freq: Once | INTRAVENOUS | Status: AC
  Administered 2014-12-21: 0.25 mg via INTRAVENOUS

## 2014-12-21 MED ORDER — HEPARIN SOD (PORK) LOCK FLUSH 100 UNIT/ML IV SOLN
500.0000 [IU] | Freq: Once | INTRAVENOUS | Status: DC | PRN
  Filled 2014-12-21: qty 5

## 2014-12-21 MED ORDER — ACETAMINOPHEN 325 MG PO TABS
ORAL_TABLET | ORAL | Status: AC
  Filled 2014-12-21: qty 2

## 2014-12-21 MED ORDER — TRASTUZUMAB CHEMO INJECTION 440 MG
6.0000 mg/kg | Freq: Once | INTRAVENOUS | Status: AC
  Administered 2014-12-21: 462 mg via INTRAVENOUS
  Filled 2014-12-21: qty 22

## 2014-12-21 MED ORDER — SODIUM CHLORIDE 0.9 % IJ SOLN
10.0000 mL | INTRAMUSCULAR | Status: DC | PRN
  Filled 2014-12-21: qty 10

## 2014-12-21 MED ORDER — ACETAMINOPHEN 325 MG PO TABS
650.0000 mg | ORAL_TABLET | Freq: Once | ORAL | Status: AC
  Administered 2014-12-21: 650 mg via ORAL

## 2014-12-21 MED ORDER — SODIUM CHLORIDE 0.9 % IV SOLN
420.0000 mg | Freq: Once | INTRAVENOUS | Status: AC
  Administered 2014-12-21: 420 mg via INTRAVENOUS
  Filled 2014-12-21: qty 14

## 2014-12-21 NOTE — Progress Notes (Signed)
Patient Care Team: Loraine Leriche, MD as PCP - General (Internal Medicine)  DIAGNOSIS: Left breast cancer ER positive PR negative HER-2 amplified  SUMMARY OF ONCOLOGIC HISTORY:   Breast cancer of upper-outer quadrant of left female breast   08/27/2014 Initial Diagnosis Excisional biopsy: 2 lumps showing 1.5 cm and 0.9 cm IDC, Grade 3, ER Pos, PR Neg and HER-2 amplified Ratio 2.6, Multifocal   09/27/2014 -  Neo-Adjuvant Chemotherapy Neoadjuvant TCH Perjeta every 3 week 6 followed by Herceptin maintenance    CHIEF COMPLIANT: Cycle 5 TCH Perjeta  INTERVAL HISTORY: Natasha Torres is a 34 year old with above-mentioned history of left breast cancer currently on neo-adjuvant chemotherapy and is here today for cycle 5 of chemotherapy. There are a lot of difficulty accessing her port and we are planning to do a dye study to evaluate the cause of her port dysfunction. She had many side effects to chemotherapy including a rash acneiform in nature on her chest which had improved with topical clindamycin. She also had fatigue and alopecia and difficulty with sleeping at night. She also had heartburn related to steroids.  REVIEW OF SYSTEMS:   Constitutional: Denies fevers, chills or abnormal weight loss Eyes: Denies blurriness of vision Ears, nose, mouth, throat, and face: Denies mucositis or sore throat Respiratory: Denies cough, dyspnea or wheezes Cardiovascular: Denies palpitation, chest discomfort or lower extremity swelling Gastrointestinal:  Denies nausea, heartburn or change in bowel habits Skin: Acneform rash Lymphatics: Denies new lymphadenopathy or easy bruising Neurological:Denies numbness, tingling or new weaknesses Behavioral/Psych: Mood is stable, no new changes , difficulty with sleep All other systems were reviewed with the patient and are negative.  I have reviewed the past medical history, past surgical history, social history and family history with the patient and they are  unchanged from previous note.  ALLERGIES:  is allergic to morphine and related and penicillins.  MEDICATIONS:  Current Outpatient Prescriptions  Medication Sig Dispense Refill  . Alum & Mag Hydroxide-Simeth (MAGIC MOUTHWASH W/LIDOCAINE) SOLN Take 5 mLs by mouth 4 (four) times daily as needed for mouth pain ([facility] mouthwash w/ nystatin 1:1). 240 mL 0  . clindamycin (CLINDAGEL) 1 % gel Apply topically 2 (two) times daily. 30 g 0  . dexamethasone (DECADRON) 4 MG tablet Take 1 tablet (4 mg total) by mouth 2 (two) times daily. Start the day before Taxotere. Then again the day after chemo for 3 days. 30 tablet 1  . escitalopram (LEXAPRO) 10 MG tablet TAKE ONE TABLET BY MOUTH ONE TIME DAILY 30 tablet 0  . HYDROcodone-acetaminophen (NORCO/VICODIN) 5-325 MG per tablet     . lidocaine-prilocaine (EMLA) cream Apply to affected area once 30 g 3  . loratadine (CLARITIN) 10 MG tablet Take 10 mg by mouth daily.    [name] LORazepam (ATIVAN) 0.5 MG tablet TAKE 1 TABLET BY MOUTH EVERY 6 HOURS AS NEEDED 30 tablet 0  . Melatonin 3 MG CAPS Take 1 capsule by mouth 3 (three) times a week.    . Multiple Vitamin (MULTIVITAMIN) tablet Take 1 tablet by mouth daily.    . ondansetron (ZOFRAN) 8 MG tablet Take 1 tablet (8 mg total) by mouth 2 (two) times daily. Start the day after chemo for 3 days. Then take as needed for nausea or vomiting. 30 tablet 1  . prochlorperazine (COMPAZINE) 10 MG tablet Take 1 tablet (10 mg total) by mouth every 6 (six) hours as needed (Nausea or vomiting). 30 tablet 1  . sucralfate (CARAFATE) 1 G tablet Take 1  tablet (1 g total) by mouth 4 (four) times daily -  with meals and at bedtime. 40 tablet 1  . UNABLE TO FIND 1 each by Other route daily. 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 zolpidem (AMBIEN) 5 MG tablet Take 1 tablet (5 mg total) by mouth at bedtime as needed for sleep. 30 tablet 0   No current  facility-administered medications for this visit.   Facility-Administered Medications Ordered in Other Visits  Medication Dose Route Frequency Provider Last Rate Last Dose  . sodium chloride 0.9 % injection 10 mL  10 mL Intravenous PRN Nicholas Lose, MD   10 mL at 12/21/14 0951    PHYSICAL EXAMINATION: ECOG PERFORMANCE STATUS: 1 - Symptomatic but completely ambulatory  There were no vitals filed for this visit. There were no vitals filed for this visit.  GENERAL:alert, no distress and comfortable SKIN: skin color, texture, turgor are normal, no rashes or significant lesions EYES: normal, Conjunctiva are pink and non-injected, sclera clear OROPHARYNX:no exudate, no erythema and lips, buccal mucosa, and tongue normal  NECK: supple, thyroid normal size, non-tender, without nodularity LYMPH:  no palpable lymphadenopathy in the cervical, axillary or inguinal LUNGS: clear to auscultation and percussion with normal breathing effort HEART: regular rate & rhythm and no murmurs and no lower extremity edema ABDOMEN:abdomen soft, non-tender and normal bowel sounds Musculoskeletal:no cyanosis of digits and no clubbing  NEURO: alert & oriented x 3 with fluent speech, no focal motor/sensory deficits   LABORATORY DATA:  I have reviewed the data as listed   Chemistry      Component Value Date/Time   NA 140 11/29/2014 0822   NA 135 11/29/2011 1615   K 3.7 11/29/2014 0822   K 4.6 11/29/2011 1615   CL 102 11/29/2011 1615   CO2 23 11/29/2014 0822   CO2 24 11/29/2011 1615   BUN 13.2 11/29/2014 0822   BUN 14 11/29/2011 1615   CREATININE 0.8 11/29/2014 0822   CREATININE 0.71 11/29/2011 1615      Component Value Date/Time   CALCIUM 9.2 11/29/2014 0822   CALCIUM 9.2 11/29/2011 1615   ALKPHOS 70 11/29/2014 0822   ALKPHOS 121* 11/29/2011 1615   AST 21 11/29/2014 0822   AST 27 11/29/2011 1615   ALT 14 11/29/2014 0822   ALT 24 11/29/2011 1615   BILITOT 0.24 11/29/2014 0822   BILITOT 0.1*  11/29/2011 1615       Lab Results  Component Value Date   WBC 7.6 11/29/2014   HGB 10.5* 11/29/2014   HCT 31.8* 11/29/2014   MCV 93.5 11/29/2014   PLT 190 11/29/2014   NEUTROABS 6.2 11/29/2014   ASSESSMENT & PLAN:  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 Biopsy of the right breast mass and left axillary lymph node and the secondary mass left breast back as benign.  Treatment plan: Neoadjuvant chemotherapy with [place] followed by mastectomy with reconstruction with adjuvant maintenance Herceptin, along with anti-estrogen therapy with tamoxifen 10 years Current treatment: Cycle 5 day 1 of TCH Perjeta  Toxicities: 1. Diarrhea and abdominal pain related to  Perjeta improved with Imodium. Reflux symptoms are better without treatment, but she has Zantac at home if she needs it. 2. Fatigue day 5-7 3. Acneiform rash on the face and chest wall I prescribed clindamycin topical ointment 4. Sore throat, nonproductive cough, mild headache, and occasional nausea.  5. Severe acid reflux and weight gain: She is off of oral dexamethasone therapy 6. Right shoulder pain: X-ray 11/29/2014 to evaluate port looked normal 7. Insomnia. She is currently taking Ativan 0.5 mg at bedtime  Discussion regarding antiestrogen therapy: I discussed with her 3 options for antiestrogen therapy and discuss the results of ATLAS trial, soft and TEXT trials 1. First option tamoxifen alone we discussed the risks and benefits of tamoxifen 2. Second option tamoxifen plus ovarian suppression 3. Third option and anastrozole plus ovarian suppression If she  chooses the third option, I would monitor her estradiol levels to make sure that the ovarian suppression is effective. Based on the soft trial, in patients younger than 40 years and have high risk disease, the third option was found to be the most beneficial with the highest improvement in progression free survival.   Plan: 1. Breast MRI after cycle 6 we will arrange for this 1 week after finishing chemotherapy 2. Tumor board discussion 3. Surgery evaluation after MRI Patient wanted to meet with physical therapy to know what she needs to go to prevent lymphedema.  Return to clinic in 3 weeks for cycle 6.   No orders of the defined types were placed in this encounter.   The patient has a good understanding of the overall plan. she agrees with it. she will call with any problems that may develop before the next visit here.   Rulon Eisenmenger, MD

## 2014-12-21 NOTE — Progress Notes (Signed)
See access note.  Patient will have dye study on portacath prior to next treatment.

## 2014-12-21 NOTE — Patient Instructions (Signed)
[facility] Discharge Instructions for Patients Receiving Chemotherapy  Today you received the following chemotherapy agents: Herceptin, Perjeta, Taxotere, Carboplatin. To help prevent nausea and vomiting after your treatment, we encourage you to take your nausea medication.   If you develop nausea and vomiting that is not controlled by your nausea medication, call the clinic.   BELOW ARE SYMPTOMS THAT SHOULD BE REPORTED IMMEDIATELY:  *FEVER GREATER THAN 100.5 F  *CHILLS WITH OR WITHOUT FEVER  NAUSEA AND VOMITING THAT IS NOT CONTROLLED WITH YOUR NAUSEA MEDICATION  *UNUSUAL SHORTNESS OF BREATH  *UNUSUAL BRUISING OR BLEEDING  TENDERNESS IN MOUTH AND THROAT WITH OR WITHOUT PRESENCE OF ULCERS  *URINARY PROBLEMS  *BOWEL PROBLEMS  UNUSUAL RASH Items with * indicate a potential emergency and should be followed up as soon as possible.  Feel free to call the clinic you have any questions or concerns. The clinic phone number is [phone].  Please show the [facility] at check-in to the Emergency Department and triage nurse.

## 2014-12-21 NOTE — Assessment & Plan Note (Signed)
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 Biopsy of the right breast mass and left axillary lymph node and the secondary mass left breast back as benign.  Treatment plan: Neoadjuvant chemotherapy with [place] followed by mastectomy with reconstruction with adjuvant maintenance Herceptin, along with anti-estrogen therapy with tamoxifen 10 years Current treatment: Cycle 5 day 1 of TCH Perjeta  Toxicities: 1. Diarrhea and abdominal pain related to Perjeta improved with Imodium. Reflux symptoms are better without treatment, but she has Zantac at home if she needs it. 2. Fatigue day 5-7 3. Acneiform rash on the face and chest wall I prescribed clindamycin topical ointment 4. Sore throat, nonproductive cough, mild headache, and occasional nausea.  5. Severe acid reflux and weight gain: She is off of oral dexamethasone therapy 6. Right shoulder pain: X-ray 11/29/2014 to evaluate port looked normal 7. Insomnia. She is currently taking Ativan 0.5 mg at bedtime  Discussion regarding antiestrogen therapy:  Plan: 1. Breast MRI after cycle 6 2. By tumor board discussion 3. Surgery evaluation  Return to clinic in 3 weeks for cycle 6

## 2014-12-21 NOTE — Telephone Encounter (Signed)
Appointments made and avs printed for patient °

## 2014-12-22 ENCOUNTER — Telehealth: Payer: Self-pay | Admitting: *Deleted

## 2014-12-22 NOTE — Patient Instructions (Signed)

## 2014-12-22 NOTE — Telephone Encounter (Signed)
VM message from pt @ 4:09 pm regarding appt for dye study of her port. POF has been sent. Awaiting schedulers to call her.

## 2014-12-23 ENCOUNTER — Other Ambulatory Visit: Payer: Self-pay | Admitting: *Deleted

## 2014-12-27 ENCOUNTER — Other Ambulatory Visit: Payer: Self-pay

## 2014-12-27 DIAGNOSIS — C50412 Malignant neoplasm of upper-outer quadrant of left female breast: Secondary | ICD-10-CM

## 2014-12-30 ENCOUNTER — Telehealth: Payer: Self-pay | Admitting: Hematology and Oncology

## 2014-12-30 NOTE — Telephone Encounter (Signed)
Confirmed appointment for 07/18

## 2014-12-31 ENCOUNTER — Telehealth: Payer: Self-pay

## 2014-12-31 NOTE — Telephone Encounter (Signed)
Care consideration note rcvd from [employer] d[employer] 12/21/14.  Reviewed by Dr. Lavonda Jumbo to scan.

## 2015-01-03 ENCOUNTER — Encounter: Payer: Self-pay | Admitting: General Practice

## 2015-01-03 ENCOUNTER — Ambulatory Visit (HOSPITAL_COMMUNITY)
Admission: RE | Admit: 2015-01-03 | Discharge: 2015-01-03 | Disposition: A | Discharge status: Home / Self Care | Payer: Managed Care, Other (non HMO) | Source: Ambulatory Visit | Attending: Hematology and Oncology | Admitting: Hematology and Oncology

## 2015-01-03 ENCOUNTER — Other Ambulatory Visit: Payer: Self-pay

## 2015-01-03 DIAGNOSIS — C50412 Malignant neoplasm of upper-outer quadrant of left female breast: Secondary | ICD-10-CM

## 2015-01-03 DIAGNOSIS — Z452 Encounter for adjustment and management of vascular access device: Secondary | ICD-10-CM | POA: Insufficient documentation

## 2015-01-03 MED ORDER — IOHEXOL 300 MG/ML  SOLN
10.0000 mL | Freq: Once | INTRAMUSCULAR | Status: AC | PRN
  Administered 2015-01-03: 10 mL via INTRAVENOUS

## 2015-01-03 NOTE — Progress Notes (Signed)
Spiritual Care Note  Provided brief support to Natasha Torres at the [facility] today; she was slightly tearful, reports distress related to family relationships, and plans to follow up by email or phone to schedule appointment for further support.  Strengths include self-care (had massage today and is meeting up with support person for social contact this afternoon).  [place], [place] Pager:  [phone] Voicemail:  [phone]

## 2015-01-03 NOTE — Procedures (Signed)
R SCV PAC check There is a hole/defect in catheter as it crosses under clavicle, with local extrav of contrast. No comp/EBL

## 2015-01-05 ENCOUNTER — Other Ambulatory Visit: Payer: Self-pay | Admitting: Radiology

## 2015-01-05 ENCOUNTER — Other Ambulatory Visit: Payer: Self-pay | Admitting: General Surgery

## 2015-01-05 DIAGNOSIS — C50412 Malignant neoplasm of upper-outer quadrant of left female breast: Secondary | ICD-10-CM

## 2015-01-06 ENCOUNTER — Other Ambulatory Visit: Payer: Self-pay | Admitting: Radiology

## 2015-01-07 ENCOUNTER — Ambulatory Visit (HOSPITAL_COMMUNITY)
Admission: RE | Admit: 2015-01-07 | Discharge: 2015-01-07 | Disposition: A | Discharge status: Home / Self Care | Payer: Managed Care, Other (non HMO) | Source: Ambulatory Visit | Attending: Hematology and Oncology | Admitting: Hematology and Oncology

## 2015-01-07 ENCOUNTER — Encounter (HOSPITAL_COMMUNITY): Payer: Self-pay

## 2015-01-07 ENCOUNTER — Other Ambulatory Visit: Payer: Self-pay | Admitting: Hematology and Oncology

## 2015-01-07 DIAGNOSIS — Z87891 Personal history of nicotine dependence: Secondary | ICD-10-CM | POA: Insufficient documentation

## 2015-01-07 DIAGNOSIS — C50912 Malignant neoplasm of unspecified site of left female breast: Secondary | ICD-10-CM | POA: Diagnosis present

## 2015-01-07 DIAGNOSIS — C50412 Malignant neoplasm of upper-outer quadrant of left female breast: Secondary | ICD-10-CM

## 2015-01-07 DIAGNOSIS — T82594A Other mechanical complication of infusion catheter, initial encounter: Secondary | ICD-10-CM | POA: Insufficient documentation

## 2015-01-07 HISTORY — PX: PORT-A-CATH REMOVAL: SHX5289

## 2015-01-07 LAB — CBC WITH DIFFERENTIAL/PLATELET
BASOS ABS: 0 10*3/uL (ref 0.0–0.1)
Basophils Relative: 1 % (ref 0–1)
Eosinophils Absolute: 0 10*3/uL (ref 0.0–0.7)
Eosinophils Relative: 0 % (ref 0–5)
HEMATOCRIT: 31.7 % — AB (ref 36.0–46.0)
Hemoglobin: 10.3 g/dL — ABNORMAL LOW (ref 12.0–15.0)
Lymphocytes Relative: 31 % (ref 12–46)
Lymphs Abs: 0.9 10*3/uL (ref 0.7–4.0)
MCH: 31.8 pg (ref 26.0–34.0)
MCHC: 32.5 g/dL (ref 30.0–36.0)
MCV: 97.8 fL (ref 78.0–100.0)
MONOS PCT: 10 % (ref 3–12)
Monocytes Absolute: 0.3 10*3/uL (ref 0.1–1.0)
NEUTROS ABS: 1.7 10*3/uL (ref 1.7–7.7)
NEUTROS PCT: 58 % (ref 43–77)
Platelets: 138 10*3/uL — ABNORMAL LOW (ref 150–400)
RBC: 3.24 MIL/uL — ABNORMAL LOW (ref 3.87–5.11)
RDW: 14.2 % (ref 11.5–15.5)
WBC: 2.9 10*3/uL — ABNORMAL LOW (ref 4.0–10.5)

## 2015-01-07 LAB — PROTIME-INR
INR: 0.98 (ref 0.00–1.49)
Prothrombin Time: 13.2 seconds (ref 11.6–15.2)

## 2015-01-07 LAB — HCG, SERUM, QUALITATIVE: Preg, Serum: NEGATIVE

## 2015-01-07 MED ORDER — HEPARIN SOD (PORK) LOCK FLUSH 100 UNIT/ML IV SOLN
INTRAVENOUS | Status: AC
  Filled 2015-01-07: qty 5

## 2015-01-07 MED ORDER — HEPARIN SOD (PORK) LOCK FLUSH 100 UNIT/ML IV SOLN
INTRAVENOUS | Status: AC | PRN
  Administered 2015-01-07: 500 [IU]

## 2015-01-07 MED ORDER — FENTANYL CITRATE (PF) 100 MCG/2ML IJ SOLN
INTRAMUSCULAR | Status: AC
  Filled 2015-01-07: qty 4

## 2015-01-07 MED ORDER — SODIUM CHLORIDE 0.9 % IV SOLN
INTRAVENOUS | Status: DC
  Administered 2015-01-07: 08:00:00 via INTRAVENOUS

## 2015-01-07 MED ORDER — LIDOCAINE HCL 1 % IJ SOLN
INTRAMUSCULAR | Status: AC
  Filled 2015-01-07: qty 20

## 2015-01-07 MED ORDER — VANCOMYCIN HCL IN DEXTROSE 1-5 GM/200ML-% IV SOLN
1000.0000 mg | Freq: Once | INTRAVENOUS | Status: AC
  Administered 2015-01-07: 1000 mg via INTRAVENOUS
  Filled 2015-01-07: qty 200

## 2015-01-07 MED ORDER — MIDAZOLAM HCL 2 MG/2ML IJ SOLN
INTRAMUSCULAR | Status: AC
  Filled 2015-01-07: qty 4

## 2015-01-07 MED ORDER — FENTANYL CITRATE (PF) 100 MCG/2ML IJ SOLN
INTRAMUSCULAR | Status: AC | PRN
  Administered 2015-01-07: 25 ug via INTRAVENOUS
  Administered 2015-01-07: 50 ug via INTRAVENOUS
  Administered 2015-01-07 (×2): 25 ug via INTRAVENOUS

## 2015-01-07 MED ORDER — LIDOCAINE-EPINEPHRINE 2 %-1:100000 IJ SOLN
INTRAMUSCULAR | Status: AC
  Filled 2015-01-07: qty 1

## 2015-01-07 MED ORDER — MIDAZOLAM HCL 2 MG/2ML IJ SOLN
INTRAMUSCULAR | Status: AC | PRN
  Administered 2015-01-07: 0.5 mg via INTRAVENOUS
  Administered 2015-01-07: 1 mg via INTRAVENOUS
  Administered 2015-01-07 (×3): 0.5 mg via INTRAVENOUS

## 2015-01-07 NOTE — Procedures (Signed)
Interventional Radiology Procedure Note  Procedure: Revision of damaged right subclavian portacatheter.  The existing right subclavian port was removed intact and a new right IJ approach single lumen PowerPort was placed.  Tip is positioned at the upper RA and catheter is ready for immediate use.  Complications: No immediate Recommendations:  - Ok to shower tomorrow - Do not submerge for 7 days - Routine line care   Signed,  Criselda Peaches, MD

## 2015-01-07 NOTE — Discharge Instructions (Signed)
Implanted Port Insertion, Care After °Refer to this sheet in the next few weeks. These instructions provide you with information on caring for yourself after your procedure. Your health care provider may also give you more specific instructions. Your treatment has been planned according to current medical practices, but problems sometimes occur. Call your health care provider if you have any problems or questions after your procedure. °WHAT TO EXPECT AFTER THE PROCEDURE °After your procedure, it is typical to have the following:  °· Discomfort at the port insertion site. Ice packs to the area will help. °· Bruising on the skin over the port. This will subside in 3-4 days. °HOME CARE INSTRUCTIONS °· After your port is placed, you will get a manufacturer's information card. The card has information about your port. Keep this card with you at all times.   °· Know what kind of port you have. There are many types of ports available.   °· Wear a medical alert bracelet in case of an emergency. This can help alert health care workers that you have a port.   °· The port can stay in for as long as your health care provider believes it is necessary.   °· A home health care nurse may give medicines and take care of the port.   °· You or a family member can get special training and directions for giving medicine and taking care of the port at home.   °SEEK MEDICAL CARE IF:  °· Your port does not flush or you are unable to get a blood return.   °· You have a fever or chills. °SEEK IMMEDIATE MEDICAL CARE IF: °· You have new fluid or pus coming from your incision.   °· You notice a bad smell coming from your incision site.   °· You have swelling, pain, or more redness at the incision or port site.   °· You have chest pain or shortness of breath. °Document Released: 03/25/2013 Document Revised: 06/09/2013 Document Reviewed: 03/25/2013 °ExitCare® Patient Information ©2015 [employer]. This information is not intended to replace  advice given to you by your health care provider. Make sure you discuss any questions you have with your health care provider. °Implanted Port Home Guide °An implanted port is a type of central line that is placed under the skin. Central lines are used to provide IV access when treatment or nutrition needs to be given through a person's veins. Implanted ports are used for long-term IV access. An implanted port may be placed because:  °· You need IV medicine that would be irritating to the small veins in your hands or arms.   °· You need long-term IV medicines, such as antibiotics.   °· You need IV nutrition for a long period.   °· You need frequent blood draws for lab tests.   °· You need dialysis.   °Implanted ports are usually placed in the chest area, but they can also be placed in the upper arm, the abdomen, or the leg. An implanted port has two main parts:  °· Reservoir. The reservoir is round and will appear as a small, raised area under your skin. The reservoir is the part where a needle is inserted to give medicines or draw blood.   °· Catheter. The catheter is a thin, flexible tube that extends from the reservoir. The catheter is placed into a large vein. Medicine that is inserted into the reservoir goes into the catheter and then into the vein.   °HOW WILL I CARE FOR MY INCISION SITE? °Do not get the incision site wet. Bathe or   shower as directed by your health care provider.  °HOW IS MY PORT ACCESSED? °Special steps must be taken to access the port:  °· Before the port is accessed, a numbing cream can be placed on the skin. This helps numb the skin over the port site.   °· Your health care provider uses a sterile technique to access the port. °· Your health care provider must put on a mask and sterile gloves. °· The skin over your port is cleaned carefully with an antiseptic and allowed to dry. °· The port is gently pinched between sterile gloves, and a needle is inserted into the port. °· Only  "non-coring" port needles should be used to access the port. Once the port is accessed, a blood return should be checked. This helps ensure that the port is in the vein and is not clogged.   °· If your port needs to remain accessed for a constant infusion, a clear (transparent) bandage will be placed over the needle site. The bandage and needle will need to be changed every week, or as directed by your health care provider.   °· Keep the bandage covering the needle clean and dry. Do not get it wet. Follow your health care provider's instructions on how to take a shower or bath while the port is accessed.   °· If your port does not need to stay accessed, no bandage is needed over the port.   °WHAT IS FLUSHING? °Flushing helps keep the port from getting clogged. Follow your health care provider's instructions on how and when to flush the port. Ports are usually flushed with saline solution or a medicine called heparin. The need for flushing will depend on how the port is used.  °· If the port is used for intermittent medicines or blood draws, the port will need to be flushed:   °· After medicines have been given.   °· After blood has been drawn.   °· As part of routine maintenance.   °· If a constant infusion is running, the port may not need to be flushed.   °HOW LONG WILL MY PORT STAY IMPLANTED? °The port can stay in for as long as your health care provider thinks it is needed. When it is time for the port to come out, surgery will be done to remove it. The procedure is similar to the one performed when the port was put in.  °WHEN SHOULD I SEEK IMMEDIATE MEDICAL CARE? °When you have an implanted port, you should seek immediate medical care if:  °· You notice a bad smell coming from the incision site.   °· You have swelling, redness, or drainage at the incision site.   °· You have more swelling or pain at the port site or the surrounding area.   °· You have a fever that is not controlled with medicine. °Document  Released: 06/04/2005 Document Revised: 03/25/2013 Document Reviewed: 02/09/2013 °ExitCare® Patient Information ©2015 [employer]. This information is not intended to replace advice given to you by your health care provider. Make sure you discuss any questions you have with your health care provider.Conscious Sedation, Adult, Care After °Refer to this sheet in the next few weeks. These instructions provide you with information on caring for yourself after your procedure. Your health care provider may also give you more specific instructions. Your treatment has been planned according to current medical practices, but problems sometimes occur. Call your health care provider if you have any problems or questions after your procedure. °WHAT TO EXPECT AFTER THE PROCEDURE  °After your procedure: °·   You may feel sleepy, clumsy, and have poor balance for several hours. °· Vomiting may occur if you eat too soon after the procedure. °HOME CARE INSTRUCTIONS °· Do not participate in any activities where you could become injured for at least 24 hours. Do not: °¨ Drive. °¨ Swim. °¨ Ride a bicycle. °¨ Operate heavy machinery. °¨ Cook. °¨ Use power tools. °¨ Climb ladders. °¨ Work from a high place. °· Do not make important decisions or sign legal documents until you are improved. °· If you vomit, drink water, juice, or soup when you can drink without vomiting. Make sure you have little or no nausea before eating solid foods. °· Only take over-the-counter or prescription medicines for pain, discomfort, or fever as directed by your health care provider. °· Make sure you and your family fully understand everything about the medicines given to you, including what side effects may occur. °· You should not drink alcohol, take sleeping pills, or take medicines that cause drowsiness for at least 24 hours. °· If you smoke, do not smoke without supervision. °· If you are feeling better, you may resume normal activities 24 hours after you  were sedated. °· Keep all appointments with your health care provider. °SEEK MEDICAL CARE IF: °· Your skin is pale or bluish in color. °· You continue to feel nauseous or vomit. °· Your pain is getting worse and is not helped by medicine. °· You have bleeding or swelling. °· You are still sleepy or feeling clumsy after 24 hours. °SEEK IMMEDIATE MEDICAL CARE IF: °· You develop a rash. °· You have difficulty breathing. °· You develop any type of allergic problem. °· You have a fever. °MAKE SURE YOU: °· Understand these instructions. °· Will watch your condition. °· Will get help right away if you are not doing well or get worse. °Document Released: 03/25/2013 Document Reviewed: 03/25/2013 °ExitCare® Patient Information ©2015 [employer]. This information is not intended to replace advice given to you by your health care provider. Make sure you discuss any questions you have with your health care provider. ° °

## 2015-01-07 NOTE — H&P (Signed)
Chief Complaint: Patient was seen in consultation today for fractured port a catheter at the request of [place]  Referring Physician(s): Gudena,Vinay  History of Present Illness: Natasha Torres is a 34 y.o. female with significant history of left sided breast cancer s/p right sided port placed by surgical team 08/2014. The patient was found to have a fracture/hole in her port a catheter with injection performed in IR 01/03/15. She has been scheduled today for image guided right sided port a catheter removal and placement of new port a catheter. She denies any chest pain, shortness of breath or palpitations. She denies any active signs of bleeding or excessive bruising. She denies any recent fever or chills. The patient denies any history of sleep apnea or chronic oxygen use. She has previously tolerated sedation without complications.    Past Medical History  Diagnosis Date  . No pertinent past medical history   . H/O oophorectomy   . History of wisdom tooth extraction   . Hx of hand surgery   . SVD (spontaneous vaginal delivery) 12/03/2011  . 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 Right oophorectomy    . Wisdom tooth extraction    . Hand surgery       R thumb    Allergies: Morphine and related and Penicillins  Medications: Prior to Admission medications   Medication Sig Start Date End Date Taking? Authorizing Provider  dexamethasone (DECADRON) 4 MG tablet Take 1 tablet (4 mg total) by mouth 2 (two) times daily. Start the day before Taxotere. Then again the day after chemo for 3 days. 09/20/14  Yes Nicholas Lose, MD  lidocaine-prilocaine (EMLA) cream Apply to affected area once Patient taking differently: Apply 1 application topically as needed (pain). Apply to affected area once 09/20/14  Yes Nicholas Lose, MD  loratadine (CLARITIN) 10 MG tablet Take 10 mg by mouth  daily as needed (pain from chemo).    Yes Historical Provider, MD  LORazepam (ATIVAN) 0.5 MG tablet Take 1 tablet (0.5 mg total) by mouth every 8 (eight) hours as needed. Patient taking differently: Take 0.5 mg by mouth every 8 (eight) hours as needed for anxiety.  12/21/14  Yes Nicholas Lose, MD  Multiple Vitamin (MULTIVITAMIN) tablet Take 1 tablet by mouth daily.   Yes Historical Provider, MD  prochlorperazine (COMPAZINE) 10 MG tablet Take 1 tablet (10 mg total) by mouth every 6 (six) hours as needed (Nausea or vomiting). 09/20/14  Yes Nicholas Lose, MD  UNABLE TO FIND 1 each by Other route daily. 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   Yes Historical Provider, MD  Alum & Mag Hydroxide-Simeth (MAGIC MOUTHWASH W/LIDOCAINE) SOLN Take 5 mLs by mouth 4 (four) times daily as needed for mouth pain ([facility] mouthwash w/ nystatin 1:1). Patient not taking: Reported on 12/21/2014 10/25/14   Susanne Borders, NP  clindamycin (CLINDAGEL) 1 % gel Apply topically 2 (two) times daily. Patient not taking: Reported on 12/21/2014 10/04/14   Nicholas Lose, MD  escitalopram (LEXAPRO) 10 MG tablet TAKE ONE TABLET BY MOUTH ONE TIME DAILY Patient not taking: Reported on 12/21/2014 12/08/14   Nicholas Lose, MD  ondansetron (ZOFRAN) 8 MG tablet  Take 1 tablet (8 mg total) by mouth 2 (two) times daily. Start the day after chemo for 3 days. Then take as needed for nausea or vomiting. Patient not taking: Reported on 01/07/2015 09/20/14   Nicholas Lose, MD  sucralfate (CARAFATE) 1 G tablet Take 1 tablet (1 g total) by mouth 4 (four) times daily -  with meals and at bedtime. Patient not taking: Reported on 12/21/2014 10/25/14   Susanne Borders, NP     Family History  Problem Relation Age of Onset  . Hypertension Mother   . Depression Maternal Aunt   . Cancer Maternal  Grandfather   . Cancer Paternal Grandfather   . Prostate cancer Father 63  . Dementia Maternal Grandmother   . Heart attack Maternal Grandfather   . Dementia Paternal Grandmother   . Kidney cancer Paternal Grandmother     slow growing, no treatment  . Prostate cancer Paternal Grandfather 46  . Bone cancer Paternal Grandfather 82  . Breast cancer Paternal Grandfather 56  . Lung cancer Paternal Grandfather     dx late 80s; smoker.  thought to be a 4th primary cancer  . Lung cancer Other     smoker  . Prostate cancer Other     MGMs 1/2 brother    History   Social History  . Marital Status: Married    Spouse Name: Harrell Gave  . Number of Children: 1  . Years of Education: N/A   Social History Main Topics  . Smoking status: Former Smoker -- 0.00 packs/day    Types: Cigarettes    Quit date: 09/17/1994  . Smokeless tobacco: Not on file  . Alcohol Use: 0.0 oz/week    0 Standard drinks or equivalent per week     Comment: 7-8 per week  . Drug Use: No  . Sexual Activity: Yes   Other Topics Concern  . None   Social History Narrative   ** Merged History Encounter **        Review of Systems: A 12 point ROS discussed and pertinent positives are indicated in the HPI above.  All other systems are negative.  Review of Systems  Vital Signs: BP 105/67 mmHg  Pulse 83  Temp(Src) 98.4 F (36.9 C) (Oral)  Resp 16  Ht _0  (1.803 m)  Wt 183 lb (83.008 kg)  BMI 25.53 kg/m2  SpO2 96%  Physical Exam  Constitutional: She is oriented to person, place, and time. No distress.  HENT:  Head: Normocephalic and atraumatic.  Neck: No tracheal deviation present.  Cardiovascular: Normal rate and regular rhythm.  Exam reveals no gallop and no friction rub.   No murmur heard. Pulmonary/Chest: Effort normal and breath sounds normal. No respiratory distress. She has no wheezes. She has no rales.  Abdominal: Soft. Bowel sounds are normal. She exhibits no distension. There is no  tenderness.  Neurological: She is alert and oriented to person, place, and time.  Skin: Skin is warm and dry. She is not diaphoretic.  Right sided chest port intact well healed incision    Mallampati Score:  MD Evaluation Airway: WNL Heart: WNL Abdomen: WNL Chest/ Lungs: WNL ASA  Classification: 2 Mallampati/Airway Score: One  Imaging: Ir Cv Line Injection  01/03/2015   CLINICAL DATA:  Right subclavian vein Port-A-Cath check  EXAM: CENTRAL VENOUS CATHETER  PROCEDURE: Utilizing strict sterile technique, a Huber needle was advanced into the Port-A-Cath reservoir in the right upper chest. Contrast was injected. Imaging was obtained.  FINDINGS: The tip  of the right subclavian vein Port-A-Cath device is at the cavoatrial junction.  Contrast injected fills the reservoir but exits the catheter at the crossing under the clavicle. This is almost assuredly secondary to a defect in the catheter at this location. Contrast was never seen to traverse through the catheter into the venous system.  IMPRESSION: There is a defect in the catheter as it crosses under the clavicle. Contrast injected into the porta CT exits the catheter into the adjacent subcutaneous tissue. Contrast was never seen to traverse to they right atrium.   Electronically Signed   By: Marybelle Killings M.D.   On: 01/03/2015 11:36    Labs:  CBC:  Recent Labs  11/08/14 0809 11/29/14 0822 12/21/14 0943 01/07/15 0825  WBC 5.5 7.6 7.4 2.9*  HGB 10.9* 10.5* 10.3* 10.3*  HCT 33.4* 31.8* 31.0* 31.7*  PLT 155 190 150 138*    COAGS:  Recent Labs  01/07/15 0825  INR 0.98    BMP:  Recent Labs  10/18/14 0837 11/08/14 0809 11/29/14 0822 12/21/14 0943  NA 140 140 140 141  K 4.0 3.8 3.7 3.5  CO2 _0 GLUCOSE 106 109 150* 94  BUN 15.3 10.5 13.2 11.0  CALCIUM 9.2 9.1 9.2 9.3  CREATININE 0.7 0.7 0.8 0.7    LIVER FUNCTION TESTS:  Recent Labs  10/18/14 0837 11/08/14 0809 11/29/14 0822 12/21/14 0943  BILITOT  0.32 0.38 0.24 0.28  AST _1 ALT _2 ALKPHOS 64 66 70 61  PROT 6.8 7.0 6.6 6.4  ALBUMIN 3.7 3.8 3.5 3.7    Assessment and Plan: Left sided breast cancer s/p right sided port placed by surgical team 08/2014 Fracture in port a catheter, injection performed in IR 01/03/15 Scheduled today for image guided right sided port a catheter removal and placement of new port a catheter with sedation The patient has been NPO, no blood thinners taken, labs and vitals have been reviewed. Risks and Benefits discussed with the patient including, but not limited to bleeding, infection, pneumothorax, or fibrin sheath development and need for additional procedures. All of the patient's questions were answered, patient is agreeable to proceed. Consent signed and in chart.    Thank you for this interesting consult.  I greatly enjoyed meeting CHARRISSE MASLEY and look forward to participating in their care.  A copy of this report was sent to the requesting provider on this date.  SignedHedy Jacob 01/07/2015, 9:23 AM   I spent a total of 30 Minutes in face to face in clinical consultation, greater than 50% of which was counseling/coordinating care for fractured port a catheter.

## 2015-01-10 ENCOUNTER — Encounter: Payer: Self-pay | Admitting: *Deleted

## 2015-01-10 ENCOUNTER — Encounter: Payer: Self-pay | Admitting: Hematology and Oncology

## 2015-01-10 ENCOUNTER — Other Ambulatory Visit (HOSPITAL_BASED_OUTPATIENT_CLINIC_OR_DEPARTMENT_OTHER): Payer: Managed Care, Other (non HMO)

## 2015-01-10 ENCOUNTER — Ambulatory Visit (HOSPITAL_BASED_OUTPATIENT_CLINIC_OR_DEPARTMENT_OTHER): Payer: Managed Care, Other (non HMO)

## 2015-01-10 ENCOUNTER — Ambulatory Visit (HOSPITAL_BASED_OUTPATIENT_CLINIC_OR_DEPARTMENT_OTHER): Payer: Managed Care, Other (non HMO) | Admitting: Hematology and Oncology

## 2015-01-10 ENCOUNTER — Telehealth: Payer: Self-pay | Admitting: Hematology and Oncology

## 2015-01-10 ENCOUNTER — Other Ambulatory Visit: Payer: Self-pay

## 2015-01-10 ENCOUNTER — Ambulatory Visit: Payer: Managed Care, Other (non HMO)

## 2015-01-10 VITALS — BP 123/80 | HR 80 | Temp 98.7°F | Resp 17 | Ht 71.0 in | Wt 186.8 lb

## 2015-01-10 DIAGNOSIS — D6481 Anemia due to antineoplastic chemotherapy: Secondary | ICD-10-CM

## 2015-01-10 DIAGNOSIS — G4700 Insomnia, unspecified: Secondary | ICD-10-CM

## 2015-01-10 DIAGNOSIS — C50919 Malignant neoplasm of unspecified site of unspecified female breast: Secondary | ICD-10-CM

## 2015-01-10 DIAGNOSIS — C50412 Malignant neoplasm of upper-outer quadrant of left female breast: Secondary | ICD-10-CM

## 2015-01-10 DIAGNOSIS — Z5111 Encounter for antineoplastic chemotherapy: Secondary | ICD-10-CM | POA: Diagnosis not present

## 2015-01-10 DIAGNOSIS — Z5112 Encounter for antineoplastic immunotherapy: Secondary | ICD-10-CM

## 2015-01-10 LAB — COMPREHENSIVE METABOLIC PANEL (CC13)
ALBUMIN: 3.8 g/dL (ref 3.5–5.0)
ALT: 20 U/L (ref 0–55)
ANION GAP: 8 meq/L (ref 3–11)
AST: 23 U/L (ref 5–34)
Alkaline Phosphatase: 62 U/L (ref 40–150)
BILIRUBIN TOTAL: 0.25 mg/dL (ref 0.20–1.20)
BUN: 15.8 mg/dL (ref 7.0–26.0)
CO2: 23 mEq/L (ref 22–29)
CREATININE: 0.7 mg/dL (ref 0.6–1.1)
Calcium: 9.4 mg/dL (ref 8.4–10.4)
Chloride: 109 mEq/L (ref 98–109)
EGFR: >90 mL/min/{1.73_m2} (ref >90)
Glucose: 116 mg/dl (ref 70–140)
Potassium: 4 mEq/L (ref 3.5–5.1)
SODIUM: 140 meq/L (ref 136–145)
TOTAL PROTEIN: 6.5 g/dL (ref 6.4–8.3)

## 2015-01-10 LAB — CBC WITH DIFFERENTIAL/PLATELET
BASO%: 0.5 % (ref 0.0–2.0)
BASOS ABS: 0 10*3/uL (ref 0.0–0.1)
EOS ABS: 0 10*3/uL (ref 0.0–0.5)
EOS%: 0 % (ref 0.0–7.0)
HCT: 31 % — ABNORMAL LOW (ref 34.8–46.6)
HGB: 10.4 g/dL — ABNORMAL LOW (ref 11.6–15.9)
LYMPH%: 19.4 % (ref 14.0–49.7)
MCH: 32.1 pg (ref 25.1–34.0)
MCHC: 33.6 g/dL (ref 31.5–36.0)
MCV: 95.4 fL (ref 79.5–101.0)
MONO#: 0.4 10*3/uL (ref 0.1–0.9)
MONO%: 9.3 % (ref 0.0–14.0)
NEUT#: 3.1 10*3/uL (ref 1.5–6.5)
NEUT%: 70.8 % (ref 38.4–76.8)
Platelets: 145 10*3/uL (ref 145–400)
RBC: 3.24 10*6/uL — AB (ref 3.70–5.45)
RDW: 14.8 % — ABNORMAL HIGH (ref 11.2–14.5)
WBC: 4.3 10*3/uL (ref 3.9–10.3)
lymph#: 0.8 10*3/uL — ABNORMAL LOW (ref 0.9–3.3)

## 2015-01-10 MED ORDER — SODIUM CHLORIDE 0.9 % IV SOLN
750.0000 mg | Freq: Once | INTRAVENOUS | Status: AC
  Administered 2015-01-10: 750 mg via INTRAVENOUS
  Filled 2015-01-10: qty 75

## 2015-01-10 MED ORDER — DEXAMETHASONE SODIUM PHOSPHATE 100 MG/10ML IJ SOLN
Freq: Once | INTRAMUSCULAR | Status: AC
  Administered 2015-01-10: 10:00:00 via INTRAVENOUS
  Filled 2015-01-10: qty 1

## 2015-01-10 MED ORDER — GOSERELIN ACETATE 3.6 MG ~~LOC~~ IMPL
3.6000 mg | DRUG_IMPLANT | Freq: Once | SUBCUTANEOUS | Status: AC
  Administered 2015-01-10: 3.6 mg via SUBCUTANEOUS
  Filled 2015-01-10: qty 3.6

## 2015-01-10 MED ORDER — ACETAMINOPHEN 325 MG PO TABS
ORAL_TABLET | ORAL | Status: AC
  Filled 2015-01-10: qty 2

## 2015-01-10 MED ORDER — SODIUM CHLORIDE 0.9 % IV SOLN
Freq: Once | INTRAVENOUS | Status: AC
  Administered 2015-01-10: 10:00:00 via INTRAVENOUS

## 2015-01-10 MED ORDER — DOCETAXEL CHEMO INJECTION 160 MG/16ML
75.0000 mg/m2 | Freq: Once | INTRAVENOUS | Status: AC
  Administered 2015-01-10: 150 mg via INTRAVENOUS
  Filled 2015-01-10: qty 15

## 2015-01-10 MED ORDER — SODIUM CHLORIDE 0.9 % IJ SOLN
10.0000 mL | INTRAMUSCULAR | Status: DC | PRN
  Administered 2015-01-10: 10 mL via INTRAVENOUS
  Filled 2015-01-10: qty 10

## 2015-01-10 MED ORDER — PALONOSETRON HCL INJECTION 0.25 MG/5ML
0.2500 mg | Freq: Once | INTRAVENOUS | Status: AC
  Administered 2015-01-10: 0.25 mg via INTRAVENOUS

## 2015-01-10 MED ORDER — SODIUM CHLORIDE 0.9 % IV SOLN
420.0000 mg | Freq: Once | INTRAVENOUS | Status: AC
  Administered 2015-01-10: 420 mg via INTRAVENOUS
  Filled 2015-01-10: qty 14

## 2015-01-10 MED ORDER — SODIUM CHLORIDE 0.9 % IJ SOLN
10.0000 mL | INTRAMUSCULAR | Status: DC | PRN
  Administered 2015-01-10: 10 mL
  Filled 2015-01-10: qty 10

## 2015-01-10 MED ORDER — DIPHENHYDRAMINE HCL 25 MG PO CAPS
ORAL_CAPSULE | ORAL | Status: AC
  Filled 2015-01-10: qty 2

## 2015-01-10 MED ORDER — DIPHENHYDRAMINE HCL 25 MG PO CAPS
50.0000 mg | ORAL_CAPSULE | Freq: Once | ORAL | Status: AC
  Administered 2015-01-10: 50 mg via ORAL

## 2015-01-10 MED ORDER — HEPARIN SOD (PORK) LOCK FLUSH 100 UNIT/ML IV SOLN
500.0000 [IU] | Freq: Once | INTRAVENOUS | Status: AC | PRN
  Administered 2015-01-10: 500 [IU]
  Filled 2015-01-10: qty 5

## 2015-01-10 MED ORDER — PROCHLORPERAZINE MALEATE 10 MG PO TABS
ORAL_TABLET | ORAL | Status: AC
  Filled 2015-01-10: qty 1

## 2015-01-10 MED ORDER — TRASTUZUMAB CHEMO INJECTION 440 MG
6.0000 mg/kg | Freq: Once | INTRAVENOUS | Status: AC
  Administered 2015-01-10: 462 mg via INTRAVENOUS
  Filled 2015-01-10: qty 22

## 2015-01-10 MED ORDER — PALONOSETRON HCL INJECTION 0.25 MG/5ML
INTRAVENOUS | Status: AC
  Filled 2015-01-10: qty 5

## 2015-01-10 MED ORDER — PEGFILGRASTIM 6 MG/0.6ML ~~LOC~~ PSKT
6.0000 mg | PREFILLED_SYRINGE | Freq: Once | SUBCUTANEOUS | Status: AC
  Administered 2015-01-10: 6 mg via SUBCUTANEOUS
  Filled 2015-01-10: qty 0.6

## 2015-01-10 MED ORDER — PROCHLORPERAZINE MALEATE 10 MG PO TABS
10.0000 mg | ORAL_TABLET | Freq: Once | ORAL | Status: AC
  Administered 2015-01-10: 10 mg via ORAL

## 2015-01-10 MED ORDER — ACETAMINOPHEN 325 MG PO TABS
650.0000 mg | ORAL_TABLET | Freq: Once | ORAL | Status: AC
  Administered 2015-01-10: 650 mg via ORAL

## 2015-01-10 NOTE — Telephone Encounter (Signed)
Appointments made and avs to be printed in chemo °

## 2015-01-10 NOTE — Progress Notes (Signed)
Patient Care Team: Loraine Leriche, MD as PCP - General (Internal Medicine)  DIAGNOSIS: No matching staging information was found for the patient.  SUMMARY OF ONCOLOGIC HISTORY:   Breast cancer of upper-outer quadrant of left female breast   08/27/2014 Initial Diagnosis Excisional biopsy: 2 lumps showing 1.5 cm and 0.9 cm IDC, Grade 3, ER Pos, PR Neg and HER-2 amplified Ratio 2.6, Multifocal   09/27/2014 -  Neo-Adjuvant Chemotherapy Neoadjuvant TCH Perjeta every 3 week 6 followed by Herceptin maintenance    CHIEF COMPLIANT: Cycle 6 TCH Perjeta  INTERVAL HISTORY: Natasha Torres is a 34 year old above-mentioned history of left breast cancer currently in new adjuvant chemotherapy with TCH Perjeta. She reports minor problems with chemotherapy. She denies any nausea or vomiting. Her port broke in 2 places and it had to be replaced. She did very well from this. She complains of watery eyes and dripping nose. She also feels somewhat depressed being on chemotherapy. Denies any mouth sores.  REVIEW OF SYSTEMS:   Constitutional: Denies fevers, chills or abnormal weight loss Eyes: Denies blurriness of vision Ears, nose, mouth, throat, and face: Denies mucositis or sore throat Respiratory: Denies cough, dyspnea or wheezes Cardiovascular: Denies palpitation, chest discomfort or lower extremity swelling Gastrointestinal:  Denies nausea, heartburn or change in bowel habits Skin: Denies abnormal skin rashes Lymphatics: Denies new lymphadenopathy or easy bruising Neurological:Denies numbness, tingling or new weaknesses Behavioral/Psych: Mood is stable, no new changes  All other systems were reviewed with the patient and are negative.  I have reviewed the past medical history, past surgical history, social history and family history with the patient and they are unchanged from previous note.  ALLERGIES:  is allergic to morphine and related and penicillins.  MEDICATIONS:  Current Outpatient  Prescriptions  Medication Sig Dispense Refill  . Alum & Mag Hydroxide-Simeth (MAGIC MOUTHWASH W/LIDOCAINE) SOLN Take 5 mLs by mouth 4 (four) times daily as needed for mouth pain ([facility] mouthwash w/ nystatin 1:1). 240 mL 0  . clindamycin (CLINDAGEL) 1 % gel Apply topically 2 (two) times daily. 30 g 0  . dexamethasone (DECADRON) 4 MG tablet Take 1 tablet (4 mg total) by mouth 2 (two) times daily. Start the day before Taxotere. Then again the day after chemo for 3 days. 30 tablet 1  . escitalopram (LEXAPRO) 10 MG tablet TAKE ONE TABLET BY MOUTH ONE TIME DAILY 30 tablet 0  . lidocaine-prilocaine (EMLA) cream Apply to affected area once (Patient taking differently: Apply 1 application topically as needed (pain). Apply to affected area once) 30 g 3  . loratadine (CLARITIN) 10 MG tablet Take 10 mg by mouth daily as needed (pain from chemo).     . LORazepam (ATIVAN) 0.5 MG tablet Take 1 tablet (0.5 mg total) by mouth every 8 (eight) hours as needed. (Patient taking differently: Take 0.5 mg by mouth every 8 (eight) hours as needed for anxiety. ) 60 tablet 0  . Multiple Vitamin (MULTIVITAMIN) tablet Take 1 tablet by mouth daily.    . ondansetron (ZOFRAN) 8 MG tablet Take 1 tablet (8 mg total) by mouth 2 (two) times daily. Start the day after chemo for 3 days. Then take as needed for nausea or vomiting. 30 tablet 1  . prochlorperazine (COMPAZINE) 10 MG tablet Take 1 tablet (10 mg total) by mouth every 6 (six) hours as needed (Nausea or vomiting). 30 tablet 1  . sucralfate (CARAFATE) 1 G tablet Take 1 tablet (1 g total) by mouth 4 (four) times daily -  with meals and at bedtime. 40 tablet 1  . UNABLE TO FIND 1 each by Other route daily. 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 zolpidem (AMBIEN) 5 MG tablet Take 5 mg by mouth at bedtime as needed for sleep.     No current facility-administered medications for this visit.    Facility-Administered Medications Ordered in Other Visits  Medication Dose Route Frequency Provider Last Rate Last Dose  . CARBOplatin (PARAPLATIN) 750 mg in sodium chloride 0.9 % 250 mL chemo infusion  750 mg Intravenous Once Nicholas Lose, MD      . dexamethasone (DECADRON) 10 mg in sodium chloride 0.9 % 50 mL IVPB   Intravenous Once Nicholas Lose, MD      . DOCEtaxel (TAXOTERE) 150 mg in dextrose 5 % 250 mL chemo infusion  75 mg/m2 (Treatment Plan Actual) Intravenous Once Nicholas Lose, MD      . heparin lock flush 100 unit/mL  500 Units Intracatheter Once PRN Nicholas Lose, MD      . pegfilgrastim (NEULASTA ONPRO KIT) injection 6 mg  6 mg Subcutaneous Once Nicholas Lose, MD      . pertuzumab (PERJETA) 420 mg in sodium chloride 0.9 % 250 mL chemo infusion  420 mg Intravenous Once Nicholas Lose, MD      . sodium chloride 0.9 % injection 10 mL  10 mL Intracatheter PRN Nicholas Lose, MD      . trastuzumab (HERCEPTIN) 462 mg in sodium chloride 0.9 % 250 mL chemo infusion  6 mg/kg (Treatment Plan Actual) Intravenous Once Nicholas Lose, MD        PHYSICAL EXAMINATION: ECOG PERFORMANCE STATUS: 1 - Symptomatic but completely ambulatory  Filed Vitals:   01/10/15 0912  BP: 123/80  Pulse: 80  Temp: 98.7 F (37.1 C)  Resp: 17   Filed Weights   01/10/15 0912  Weight: 186 lb 12.8 oz (84.732 kg)    GENERAL:alert, no distress and comfortable SKIN: skin color, texture, turgor are normal, no rashes or significant lesions EYES: normal, Conjunctiva are pink and non-injected, sclera clear OROPHARYNX:no exudate, no erythema and lips, buccal mucosa, and tongue normal  NECK: supple, thyroid normal size, non-tender, without nodularity LYMPH:  no palpable lymphadenopathy in the cervical, axillary or inguinal LUNGS: clear to auscultation and percussion with normal breathing effort HEART: regular rate & rhythm and no murmurs and no lower extremity edema ABDOMEN:abdomen soft, non-tender and normal bowel  sounds Musculoskeletal:no cyanosis of digits and no clubbing  NEURO: alert & oriented x 3 with fluent speech, no focal motor/sensory deficits  LABORATORY DATA:  I have reviewed the data as listed   Chemistry      Component Value Date/Time   NA 140 01/10/2015 0849   NA 135 11/29/2011 1615   K 4.0 01/10/2015 0849   K 4.6 11/29/2011 1615   CL 102 11/29/2011 1615   CO2 23 01/10/2015 0849   CO2 24 11/29/2011 1615   BUN 15.8 01/10/2015 0849   BUN 14 11/29/2011 1615   CREATININE 0.7 01/10/2015 0849   CREATININE 0.71 11/29/2011 1615      Component Value Date/Time   CALCIUM 9.4 01/10/2015 0849   CALCIUM 9.2 11/29/2011 1615   ALKPHOS 62 01/10/2015 0849   ALKPHOS 121* 11/29/2011 1615   AST 23 01/10/2015 0849   AST 27 11/29/2011 1615   ALT 20 01/10/2015 0849   ALT 24 11/29/2011 1615   BILITOT 0.25 01/10/2015 0849  BILITOT 0.1* 11/29/2011 1615       Lab Results  Component Value Date   WBC 4.3 01/10/2015   HGB 10.4* 01/10/2015   HCT 31.0* 01/10/2015   MCV 95.4 01/10/2015   PLT 145 01/10/2015   NEUTROABS 3.1 01/10/2015    ASSESSMENT & PLAN:  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 Biopsy of the right breast mass and left axillary lymph node and the secondary mass left breast back as benign.  Treatment plan: Neoadjuvant chemotherapy with [place] followed by mastectomy with reconstruction with adjuvant maintenance Herceptin, along with anti-estrogen therapy with tamoxifen 10 years Current treatment: Cycle 5 day 1 of TCH Perjeta  Toxicities: 1. Diarrhea and abdominal pain related to Perjeta improved with  Imodium. Reflux symptoms are better without treatment, but she has Zantac at home if she needs it. 2. Fatigue day 5-7 3. Acneiform rash on the face and chest wall I prescribed clindamycin topical ointment 4. Sore throat, nonproductive cough, mild headache, and occasional nausea.  5. Severe acid reflux and weight gain: She is off of oral dexamethasone therapy 6. Right shoulder pain: X-ray 11/29/2014 to evaluate port looked normal 7. Insomnia. She is currently taking Ativan 0.5 mg at bedtime 8. Port was broken: It was removed and a new port has been implanted 01/07/2015 9. 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  Plan: 1. Breast MRI 01/13/2015 2. Tumor board discussion 3. Surgery evaluation after MRI  Return to clinic 01/19/2015 to discuss the MRI after tumor board presentation    No orders of the defined types were placed in this encounter.   The patient has a good understanding of the overall plan. she agrees with it. she will call with any problems that may develop before the next visit here.   Rulon Eisenmenger, MD

## 2015-01-10 NOTE — Patient Instructions (Addendum)

## 2015-01-10 NOTE — Assessment & Plan Note (Addendum)
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 Biopsy of the right breast mass and left axillary lymph node and the secondary mass left breast back as benign.  Treatment plan: Neoadjuvant chemotherapy with [place] followed by mastectomy with reconstruction with adjuvant maintenance Herceptin, along with anti-estrogen therapy with tamoxifen 10 years Current treatment: Cycle 5 day 1 of TCH Perjeta  Toxicities: 1. Diarrhea and abdominal pain related to Perjeta improved with Imodium. Reflux symptoms are better without treatment, but she has Zantac at home if she needs it. 2. Fatigue day 5-7 3. Acneiform rash on the face and chest wall I prescribed clindamycin topical ointment 4. Sore throat, nonproductive cough, mild headache, and occasional nausea.  5. Severe acid reflux and weight gain: She is off of oral dexamethasone therapy 6. Right shoulder pain: X-ray 11/29/2014 to evaluate port looked normal 7. Insomnia. She is currently taking Ativan 0.5 mg at bedtime 8. Port was broken: It was removed and a new port has been implanted 01/07/2015  Plan: 1. Breast MRI 01/13/2015 2. Tumor board discussion 3. Surgery evaluation after MRI  Return to clinic 01/19/2015 to discuss the MRI after tumor board presentation

## 2015-01-10 NOTE — Patient Instructions (Signed)
[facility] Discharge Instructions for Patients Receiving Chemotherapy  Today you received the following chemotherapy agents: Taxotere, Carboplatin, Herceptin, Perjeta   To help prevent nausea and vomiting after your treatment, we encourage you to take your nausea medication as directed.   If you develop nausea and vomiting that is not controlled by your nausea medication, call the clinic.   BELOW ARE SYMPTOMS THAT SHOULD BE REPORTED IMMEDIATELY:  *FEVER GREATER THAN 100.5 F  *CHILLS WITH OR WITHOUT FEVER  NAUSEA AND VOMITING THAT IS NOT CONTROLLED WITH YOUR NAUSEA MEDICATION  *UNUSUAL SHORTNESS OF BREATH  *UNUSUAL BRUISING OR BLEEDING  TENDERNESS IN MOUTH AND THROAT WITH OR WITHOUT PRESENCE OF ULCERS  *URINARY PROBLEMS  *BOWEL PROBLEMS  UNUSUAL RASH Items with * indicate a potential emergency and should be followed up as soon as possible.  Feel free to call the clinic you have any questions or concerns. The clinic phone number is [phone].  Please show the [facility] at check-in to the Emergency Department and triage nurse.

## 2015-01-12 ENCOUNTER — Other Ambulatory Visit: Payer: Self-pay | Admitting: Hematology and Oncology

## 2015-01-13 ENCOUNTER — Ambulatory Visit
Admission: RE | Admit: 2015-01-13 | Discharge: 2015-01-13 | Disposition: A | Discharge status: Home / Self Care | Payer: Managed Care, Other (non HMO) | Source: Ambulatory Visit | Attending: Hematology and Oncology | Admitting: Hematology and Oncology

## 2015-01-13 DIAGNOSIS — C50412 Malignant neoplasm of upper-outer quadrant of left female breast: Secondary | ICD-10-CM

## 2015-01-13 MED ORDER — GADOBENATE DIMEGLUMINE 529 MG/ML IV SOLN
17.0000 mL | Freq: Once | INTRAVENOUS | Status: AC | PRN
  Administered 2015-01-13: 17 mL via INTRAVENOUS

## 2015-01-17 ENCOUNTER — Telehealth: Payer: Self-pay | Admitting: Hematology and Oncology

## 2015-01-17 ENCOUNTER — Ambulatory Visit (HOSPITAL_COMMUNITY)
Admission: RE | Admit: 2015-01-17 | Discharge: 2015-01-17 | Disposition: A | Discharge status: Home / Self Care | Payer: Managed Care, Other (non HMO) | Source: Ambulatory Visit | Attending: Cardiology | Admitting: Cardiology

## 2015-01-17 ENCOUNTER — Telehealth: Payer: Self-pay | Admitting: *Deleted

## 2015-01-17 ENCOUNTER — Encounter (HOSPITAL_COMMUNITY): Payer: Self-pay

## 2015-01-17 VITALS — BP 114/68 | HR 80 | Wt 186.0 lb

## 2015-01-17 DIAGNOSIS — R9439 Abnormal result of other cardiovascular function study: Secondary | ICD-10-CM | POA: Diagnosis present

## 2015-01-17 DIAGNOSIS — Z9221 Personal history of antineoplastic chemotherapy: Secondary | ICD-10-CM | POA: Diagnosis not present

## 2015-01-17 DIAGNOSIS — Z170 Estrogen receptor positive status [ER+]: Secondary | ICD-10-CM | POA: Insufficient documentation

## 2015-01-17 DIAGNOSIS — I428 Other cardiomyopathies: Secondary | ICD-10-CM

## 2015-01-17 DIAGNOSIS — C50912 Malignant neoplasm of unspecified site of left female breast: Secondary | ICD-10-CM | POA: Insufficient documentation

## 2015-01-17 DIAGNOSIS — Z87891 Personal history of nicotine dependence: Secondary | ICD-10-CM | POA: Diagnosis not present

## 2015-01-17 DIAGNOSIS — C50412 Malignant neoplasm of upper-outer quadrant of left female breast: Secondary | ICD-10-CM | POA: Diagnosis not present

## 2015-01-17 DIAGNOSIS — I429 Cardiomyopathy, unspecified: Secondary | ICD-10-CM | POA: Diagnosis not present

## 2015-01-17 DIAGNOSIS — C50919 Malignant neoplasm of unspecified site of unspecified female breast: Secondary | ICD-10-CM | POA: Diagnosis not present

## 2015-01-17 DIAGNOSIS — Z79899 Other long term (current) drug therapy: Secondary | ICD-10-CM | POA: Insufficient documentation

## 2015-01-17 MED ORDER — CANDESARTAN CILEXETIL 4 MG PO TABS: 4.0000 mg | ORAL_TABLET | Freq: Every evening | ORAL | Status: DC

## 2015-01-17 MED ORDER — CARVEDILOL 3.125 MG PO TABS: 3.1250 mg | ORAL_TABLET | Freq: Two times a day (BID) | ORAL | Status: DC

## 2015-01-17 NOTE — Telephone Encounter (Signed)
Received call from pt stating she left [place]. Dr. Marigene Ehlers is recommending she delay herceptin x2wks and have another echo prior to next treatment. Per Dr. Lindi Adie r/s herceptin to 8/19 after echo and f/u. POF entered to change infusion date.

## 2015-01-17 NOTE — Patient Instructions (Addendum)
START Carvedilol (Coreg) 3.125mg  (1 tablet) twice a day.  START Candesartan 4mg  (1 tablet ) Every evening.  FOLLOW UP in 2 weeks with ECHO. (bmet at time of appt)

## 2015-01-17 NOTE — Progress Notes (Signed)
Patient ID: Natasha Torres, female   DOB: 1981/05/04, 34 y.o.   MRN: [id number] Primary oncologist: Dr Lindi Adie  34 yo with left breast cancer presents for evaluation of fall in EF and strain by echo monitoring.  Breast cancer was diagnosed in 2/16.  ER+/PR-/HER2+.  Treated with neoadjuvant chemo (carboplatin, docetaxel, Herceptin, and pertuzumab).  She completed this treatment and is now continuing on Herceptin alone to complete 1 year of treatment. She will have a mastectomy later this month.   She had an echo in 4/16 showed EF 55-60% with GLS -25%.  However, repeat echo in 6/16 showed fall in EF to 50-55% with GLS fallen to -17%.  She feels well in general.  No exertional dyspnea, chest pain, orthopnea, PND, palpitations.  No lightheadedness.  She has no history of cardiac problems.  No family history of cardiomyopathy or early coronary disease.   Labs (7/16): K 4, creatinine 0.7  PMH: 1. Breast cancer: On left, diagnosed 2/16.  ER+/PR-/HER2+.  Treated with neoadjuvant chemo (carboplatin, docetaxel, Herceptin, and pertuzumab).  She completed this treatment and is now continuing on Herceptin alone to complete 1 year of treatment.  - Echo (4/16) with EF 55-60%, GLS -25%. - Echo (6/16) with EF 50-55%, GLS -17%  History   Social History  . Marital Status: Married    Spouse Name: Harrell Gave  . Number of Children: 1  . Years of Education: N/A   Social History Main Topics  . Smoking status: Former Smoker -- 0.00 packs/day    Types: Cigarettes    Quit date: 09/17/1994  . Smokeless tobacco: Not on file  . Alcohol Use: 0.0 oz/week    0 Standard drinks or equivalent per week     Comment: 7-8 per week  . Drug Use: No  . Sexual Activity: Yes   Other Topics Concern  . None   Social History Narrative   ** Merged History Encounter **       Family History  Problem Relation Age of Onset  . Hypertension Mother   . Depression Maternal Aunt   . Cancer Maternal Grandfather   . Cancer  Paternal Grandfather   . Prostate cancer Father 77  . Dementia Maternal Grandmother   . Heart attack Maternal Grandfather   . Dementia Paternal Grandmother   . Kidney cancer Paternal Grandmother     slow growing, no treatment  . Prostate cancer Paternal Grandfather 25  . Bone cancer Paternal Grandfather 42  . Breast cancer Paternal Grandfather 52  . Lung cancer Paternal Grandfather     dx late 57s; smoker.  thought to be a 4th primary cancer  . Lung cancer Other     smoker  . Prostate cancer Other     MGMs 1/2 brother   ROS: All systems reviewed and negative except as per HPI.  Current Outpatient Prescriptions  Medication Sig Dispense Refill  . lidocaine-prilocaine (EMLA) cream Apply to affected area once (Patient taking differently: Apply 1 application topically as needed (pain). Apply to affected area once) 30 g 3  . loratadine (CLARITIN) 10 MG tablet Take 10 mg by mouth daily as needed (pain from chemo).     . LORazepam (ATIVAN) 0.5 MG tablet Take 1 tablet (0.5 mg total) by mouth every 8 (eight) hours as needed. (Patient taking differently: Take 0.5 mg by mouth every 8 (eight) hours as needed for anxiety. ) 60 tablet 0  . Multiple Vitamin (MULTIVITAMIN) tablet Take 1 tablet by mouth daily.    [name]  prochlorperazine (COMPAZINE) 10 MG tablet Take 1 tablet (10 mg total) by mouth every 6 (six) hours as needed (Nausea or vomiting). 30 tablet 1  . UNABLE TO FIND 1 each by Other route daily. 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 zolpidem (AMBIEN) 5 MG tablet Take 5 mg by mouth at bedtime as needed for sleep.    . candesartan (ATACAND) 4 MG tablet Take 1 tablet (4 mg total) by mouth every evening. 30 tablet 3  . carvedilol (COREG) 3.125 MG tablet Take 1 tablet (3.125 mg total) by mouth 2 (two) times daily with a meal. 60 tablet 2   No current facility-administered medications for this encounter.   BP 114/68  mmHg  Pulse 80  Wt 186 lb (84.369 kg)  SpO2 98% General: NAD Neck: No JVD, no thyromegaly or thyroid nodule.  Lungs: Clear to auscultation bilaterally with normal respiratory effort. CV: Nondisplaced PMI.  Heart regular S1/S2, no S3/S4, no murmur.  No peripheral edema.  No carotid bruit.  Normal pedal pulses.  Abdomen: Soft, nontender, no hepatosplenomegaly, no distention.  Skin: Intact without lesions or rashes.  Neurologic: Alert and oriented x 3.  Psych: Normal affect. Extremities: No clubbing or cyanosis.  HEENT: Normal.   Assessment/plan: 34 yo with history of breast cancer getting Herceptin to complete 1 year of therapy.  Recent echo in 6/16 showed a fall in EF from 55-60% to 50-55%.  There was also a significant fall in global lateral strain, portending the possibility of more profound fall in EF over time.  This may be related to Herceptin.  Last Herceptin dosing was 1 week ago, she does not get it again for about 2 wks.   - Start Coreg 3.125 mg bid.  2 days after starting Coreg, as long as she is not having symptoms of low blood pressure, will have her start candesartan 4 mg in the evening (to minimize BP fall during the day).  - Repeat echo in 2 weeks prior to next Herceptin dose.  If EF/strain remain decreased, I would recommend holding Herceptin x 6 weeks and treating with Coreg/candesartan with repeat echo after 6 wks.   Loralie Champagne 01/18/2015

## 2015-01-17 NOTE — Telephone Encounter (Signed)
s.w. pt and advised on 8.15 cx and moved to 8.19....pt ok and aware

## 2015-01-18 DIAGNOSIS — I428 Other cardiomyopathies: Secondary | ICD-10-CM | POA: Insufficient documentation

## 2015-01-18 NOTE — Assessment & Plan Note (Signed)
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 Biopsy of the right breast mass and left axillary lymph node and the secondary mass left breast back as benign.  Treatment plan: Mastectomy with reconstruction with adjuvant maintenance Herceptin, along with anti-estrogen therapy with tamoxifen 10 years  MRI Breast: 01/13/15 without residual enhancing masses or abnormal areas of enhancement, compatible with treatment response.  Plan: q3 weeks for Herceptin maintenance Q 6 weeks for follow up.  We will await the results of surgery.

## 2015-01-19 ENCOUNTER — Ambulatory Visit (HOSPITAL_BASED_OUTPATIENT_CLINIC_OR_DEPARTMENT_OTHER): Payer: Managed Care, Other (non HMO) | Admitting: Hematology and Oncology

## 2015-01-19 ENCOUNTER — Encounter: Payer: Self-pay | Admitting: Hematology and Oncology

## 2015-01-19 ENCOUNTER — Other Ambulatory Visit (HOSPITAL_BASED_OUTPATIENT_CLINIC_OR_DEPARTMENT_OTHER): Payer: Managed Care, Other (non HMO)

## 2015-01-19 VITALS — BP 127/72 | HR 85 | Temp 98.6°F | Resp 17 | Wt 189.0 lb

## 2015-01-19 DIAGNOSIS — C50412 Malignant neoplasm of upper-outer quadrant of left female breast: Secondary | ICD-10-CM

## 2015-01-19 DIAGNOSIS — D6481 Anemia due to antineoplastic chemotherapy: Secondary | ICD-10-CM

## 2015-01-19 LAB — CBC WITH DIFFERENTIAL/PLATELET
BASO%: 0.7 % (ref 0.0–2.0)
Basophils Absolute: 0.1 10*3/uL (ref 0.0–0.1)
EOS%: 0 % (ref 0.0–7.0)
Eosinophils Absolute: 0 10*3/uL (ref 0.0–0.5)
HCT: 33.8 % — ABNORMAL LOW (ref 34.8–46.6)
HGB: 10.8 g/dL — ABNORMAL LOW (ref 11.6–15.9)
LYMPH%: 24.9 % (ref 14.0–49.7)
MCH: 31.6 pg (ref 25.1–34.0)
MCHC: 32 g/dL (ref 31.5–36.0)
MCV: 98.8 fL (ref 79.5–101.0)
MONO#: 1.2 10*3/uL — ABNORMAL HIGH (ref 0.1–0.9)
MONO%: 14.9 % — ABNORMAL HIGH (ref 0.0–14.0)
NEUT%: 59.5 % (ref 38.4–76.8)
NEUTROS ABS: 4.8 10*3/uL (ref 1.5–6.5)
Platelets: 180 10*3/uL (ref 145–400)
RBC: 3.42 10*6/uL — AB (ref 3.70–5.45)
RDW: 13.8 % (ref 11.2–14.5)
WBC: 8.1 10*3/uL (ref 3.9–10.3)
lymph#: 2 10*3/uL (ref 0.9–3.3)

## 2015-01-19 LAB — COMPREHENSIVE METABOLIC PANEL (CC13)
ALK PHOS: 76 U/L (ref 40–150)
ALT: 37 U/L (ref 0–55)
ANION GAP: 6 meq/L (ref 3–11)
AST: 32 U/L (ref 5–34)
Albumin: 3.6 g/dL (ref 3.5–5.0)
BUN: 12.9 mg/dL (ref 7.0–26.0)
CHLORIDE: 111 meq/L — AB (ref 98–109)
CO2: 23 mEq/L (ref 22–29)
CREATININE: 0.8 mg/dL (ref 0.6–1.1)
Calcium: 9.3 mg/dL (ref 8.4–10.4)
EGFR: >90 mL/min/{1.73_m2} (ref >90)
GLUCOSE: 87 mg/dL (ref 70–140)
Potassium: 4.4 mEq/L (ref 3.5–5.1)
SODIUM: 140 meq/L (ref 136–145)
Total Bilirubin: <0.2 mg/dL (ref 0.20–1.20)
Total Protein: 6.3 g/dL — ABNORMAL LOW (ref 6.4–8.3)

## 2015-01-19 NOTE — Progress Notes (Signed)
Patient Care Team: Loraine Leriche, MD as PCP - General (Internal Medicine)  DIAGNOSIS: No matching staging information was found for the patient.  SUMMARY OF ONCOLOGIC HISTORY:   Breast cancer of upper-outer quadrant of left female breast   08/27/2014 Initial Diagnosis Excisional biopsy: 2 lumps showing 1.5 cm and 0.9 cm IDC, Grade 3, ER Pos, PR Neg and HER-2 amplified Ratio 2.6, Multifocal   09/27/2014 - 01/10/2015 Neo-Adjuvant Chemotherapy Neoadjuvant TCH Perjeta every 3 week 6 followed by Herceptin maintenance   01/13/2015 Breast MRI Postsurgical changes in left breast without residual enhancing masses compatible with treatment response    CHIEF COMPLIANT: Follow-up after breast MRI  INTERVAL HISTORY: Natasha Torres is a 34 year old lady with above-mentioned history of left breast cancer treated with new adjuvant chemotherapy and she underwent a breast MRI and is here today to discuss the results. MRI of the breast showed remarkable response with no evidence of residual disease or masses. We had already called and discussed the results with her so she is ecstatic about it. He presented her case this morning in the tumor board and she is here to discuss the outcome of that. She reports no major residual side effects from chemotherapy.  REVIEW OF SYSTEMS:   Constitutional: Denies fevers, chills or abnormal weight loss Eyes: Denies blurriness of vision Ears, nose, mouth, throat, and face: Denies mucositis or sore throat Respiratory: Denies cough, dyspnea or wheezes Cardiovascular: Denies palpitation, chest discomfort or lower extremity swelling Gastrointestinal:  Denies nausea, heartburn or change in bowel habits Skin: Denies abnormal skin rashes Lymphatics: Denies new lymphadenopathy or easy bruising Neurological:Denies numbness, tingling or new weaknesses Behavioral/Psych: Mood is stable, no new changes  All other systems were reviewed with the patient and are negative.  I have  reviewed the past medical history, past surgical history, social history and family history with the patient and they are unchanged from previous note.  ALLERGIES:  is allergic to morphine and related and penicillins.  MEDICATIONS:  Current Outpatient Prescriptions  Medication Sig Dispense Refill  . candesartan (ATACAND) 4 MG tablet Take 1 tablet (4 mg total) by mouth every evening. 30 tablet 3  . carvedilol (COREG) 3.125 MG tablet Take 1 tablet (3.125 mg total) by mouth 2 (two) times daily with a meal. 60 tablet 2  . lidocaine-prilocaine (EMLA) cream Apply to affected area once (Patient taking differently: Apply 1 application topically as needed (pain). Apply to affected area once) 30 g 3  . loratadine (CLARITIN) 10 MG tablet Take 10 mg by mouth daily as needed (pain from chemo).     . LORazepam (ATIVAN) 0.5 MG tablet Take 1 tablet (0.5 mg total) by mouth every 8 (eight) hours as needed. (Patient taking differently: Take 0.5 mg by mouth every 8 (eight) hours as needed for anxiety. ) 60 tablet 0  . Multiple Vitamin (MULTIVITAMIN) tablet Take 1 tablet by mouth daily.    . prochlorperazine (COMPAZINE) 10 MG tablet Take 1 tablet (10 mg total) by mouth every 6 (six) hours as needed (Nausea or vomiting). 30 tablet 1  . UNABLE TO FIND 1 each by Other route daily. 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 zolpidem (AMBIEN) 5 MG tablet Take 5 mg by mouth at bedtime as needed for sleep.     No current facility-administered medications for this visit.    PHYSICAL EXAMINATION: ECOG PERFORMANCE STATUS: 0 - Asymptomatic  Filed Vitals:  01/19/15 0827  BP: 127/72  Pulse: 85  Temp: 98.6 F (37 C)  Resp: 17   Filed Weights   01/19/15 0827  Weight: 189 lb (85.73 kg)    GENERAL:alert, no distress and comfortable SKIN: skin color, texture, turgor are normal, no rashes or significant lesions EYES: normal, Conjunctiva  are pink and non-injected, sclera clear OROPHARYNX:no exudate, no erythema and lips, buccal mucosa, and tongue normal  NECK: supple, thyroid normal size, non-tender, without nodularity LYMPH:  no palpable lymphadenopathy in the cervical, axillary or inguinal LUNGS: clear to auscultation and percussion with normal breathing effort HEART: regular rate & rhythm and no murmurs and no lower extremity edema ABDOMEN:abdomen soft, non-tender and normal bowel sounds Musculoskeletal:no cyanosis of digits and no clubbing  NEURO: alert & oriented x 3 with fluent speech, no focal motor/sensory deficits   LABORATORY DATA:  I have reviewed the data as listed   Chemistry      Component Value Date/Time   NA 140 01/10/2015 0849   NA 135 11/29/2011 1615   K 4.0 01/10/2015 0849   K 4.6 11/29/2011 1615   CL 102 11/29/2011 1615   CO2 23 01/10/2015 0849   CO2 24 11/29/2011 1615   BUN 15.8 01/10/2015 0849   BUN 14 11/29/2011 1615   CREATININE 0.7 01/10/2015 0849   CREATININE 0.71 11/29/2011 1615      Component Value Date/Time   CALCIUM 9.4 01/10/2015 0849   CALCIUM 9.2 11/29/2011 1615   ALKPHOS 62 01/10/2015 0849   ALKPHOS 121* 11/29/2011 1615   AST 23 01/10/2015 0849   AST 27 11/29/2011 1615   ALT 20 01/10/2015 0849   ALT 24 11/29/2011 1615   BILITOT 0.25 01/10/2015 0849   BILITOT 0.1* 11/29/2011 1615       Lab Results  Component Value Date   WBC 8.1 01/19/2015   HGB 10.8* 01/19/2015   HCT 33.8* 01/19/2015   MCV 98.8 01/19/2015   PLT 180 01/19/2015   NEUTROABS 4.8 01/19/2015    ASSESSMENT & PLAN:  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 Biopsy of the right breast mass and left axillary lymph node and the secondary mass left breast back as benign.  Treatment plan: Mastectomy with reconstruction with adjuvant maintenance Herceptin, along with anti-estrogen therapy with tamoxifen 10 years  MRI Breast: 01/13/15 without residual enhancing masses or abnormal areas of enhancement, compatible with treatment response.  Anemia due to chemotherapy: Hemoglobin 10.4 stable Herceptin related mild decrease in cardiac ejection fraction: Under care of cardiology they started her on Coreg and ARBs. Her next echocardiogram is scheduled for August 18 and her next Herceptin has been moved to August 19. If there are further problems with her cardiac function, Herceptin may need to be held briefly.  Plan: q3 weeks for Herceptin maintenance Q 6 weeks for follow up (next visit September 5).  We will await the results of surgery.  No orders of the defined types were placed in this encounter.   The patient has a good understanding of the overall plan. she agrees with it. she will call with any problems that may develop before the next visit here.  Rulon Eisenmenger, MD

## 2015-01-20 ENCOUNTER — Encounter: Payer: Self-pay | Admitting: *Deleted

## 2015-01-27 NOTE — H&P (Signed)
  Subjective:    Patient ID: Natasha Torres is a 34 y.o. female.  HPI  Here for follow up discussion reconstruction. Presented with palpable mass on left. MMG and US revealed 2 masses in breast. 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 Genetics with ATM mutation.. Biopsy of the right breast mass and left axillary lymph node and the secondary mass left breast was benign. Has been undergoing neoadjuvant chemotherapy. Has had problems with rash, and this week noted to have defect catheter with some extravasation. Being replaced by IR this week.  Plan for left NSM, right prophylactic NSM. Dr. Donne Hazel will try for SLN though this may not be possible given prior surgery.   Current 36 C, desires this but fuller. Notes 20 lb wt gain with chemotherapy. Has been off work throughout chemotherapy.  Review of Systems     Objective:   Physical Exam  Cardiovascular: Normal rate, regular rhythm and normal heart sounds.  Pulmonary/Chest: Effort normal and breath sounds normal.  Genitourinary:  eft UOQ scar pink, healed Pseudoptosis bilat No masses SN to nipple R 22 L 22 cm BW R 15 L 15 cm Nipple to IMF R 7 L 7 cm  Lymphadenopathy:   She has no axillary adenopathy.  l     Assessment:     Left breast cancer, ATM mutation  Neoadjuvant chemotherapy    Plan:      Reviewed NSM with IMF incision, nipple and breast skin will be asensate, not stimulate. Plan expander placement with possible acellular dermis. Reviewed staged nature of reconstruction, hospital stay, drains. Reviewed process of expansion, post op limitations and visits, implant specific risks including rupture,extrusion, infection requiring removal, contracture. Discussed additional risks including but not limited to seroma, hematoma, damage to deeper structures, DVT/PE, cardiopulmonary complications, nipple or  mastectomy flap loss, wound healing problems.         Irene Limbo, MD [facility] [phone]

## 2015-01-31 ENCOUNTER — Ambulatory Visit: Payer: Self-pay

## 2015-02-01 ENCOUNTER — Encounter (HOSPITAL_COMMUNITY)
Admission: RE | Admit: 2015-02-01 | Discharge: 2015-02-01 | Disposition: A | Discharge status: Home / Self Care | Payer: Managed Care, Other (non HMO) | Source: Ambulatory Visit | Attending: General Surgery | Admitting: General Surgery

## 2015-02-01 ENCOUNTER — Other Ambulatory Visit: Payer: Self-pay

## 2015-02-01 ENCOUNTER — Encounter (HOSPITAL_COMMUNITY): Payer: Self-pay

## 2015-02-01 DIAGNOSIS — I429 Cardiomyopathy, unspecified: Secondary | ICD-10-CM | POA: Diagnosis not present

## 2015-02-01 DIAGNOSIS — Z01818 Encounter for other preprocedural examination: Secondary | ICD-10-CM | POA: Diagnosis present

## 2015-02-01 DIAGNOSIS — C50912 Malignant neoplasm of unspecified site of left female breast: Secondary | ICD-10-CM | POA: Diagnosis present

## 2015-02-01 DIAGNOSIS — Z880 Allergy status to penicillin: Secondary | ICD-10-CM | POA: Diagnosis not present

## 2015-02-01 DIAGNOSIS — D0512 Intraductal carcinoma in situ of left breast: Secondary | ICD-10-CM | POA: Diagnosis not present

## 2015-02-01 DIAGNOSIS — Z79899 Other long term (current) drug therapy: Secondary | ICD-10-CM | POA: Diagnosis not present

## 2015-02-01 DIAGNOSIS — Z170 Estrogen receptor positive status [ER+]: Secondary | ICD-10-CM | POA: Diagnosis not present

## 2015-02-01 DIAGNOSIS — Z4001 Encounter for prophylactic removal of breast: Secondary | ICD-10-CM | POA: Diagnosis not present

## 2015-02-01 DIAGNOSIS — Z8249 Family history of ischemic heart disease and other diseases of the circulatory system: Secondary | ICD-10-CM | POA: Diagnosis not present

## 2015-02-01 HISTORY — DX: Anxiety disorder, unspecified: F41.9

## 2015-02-01 HISTORY — DX: Family history of other specified conditions: Z84.89

## 2015-02-01 LAB — BASIC METABOLIC PANEL
ANION GAP: 6 (ref 5–15)
BUN: 11 mg/dL (ref 6–20)
CHLORIDE: 107 mmol/L (ref 101–111)
CO2: 27 mmol/L (ref 22–32)
Calcium: 9 mg/dL (ref 8.9–10.3)
Creatinine, Ser: 0.64 mg/dL (ref 0.44–1.00)
GFR calc Af Amer: >60 mL/min (ref >60)
GFR calc non Af Amer: >60 mL/min (ref >60)
GLUCOSE: 92 mg/dL (ref 65–99)
POTASSIUM: 4.3 mmol/L (ref 3.5–5.1)
Sodium: 140 mmol/L (ref 135–145)

## 2015-02-01 LAB — CBC WITH DIFFERENTIAL/PLATELET
Basophils Absolute: 0 10*3/uL (ref 0.0–0.1)
Basophils Relative: 0 % (ref 0–1)
EOS PCT: 0 % (ref 0–5)
Eosinophils Absolute: 0 10*3/uL (ref 0.0–0.7)
HEMATOCRIT: 32.6 % — AB (ref 36.0–46.0)
Hemoglobin: 10.6 g/dL — ABNORMAL LOW (ref 12.0–15.0)
LYMPHS ABS: 1 10*3/uL (ref 0.7–4.0)
LYMPHS PCT: 28 % (ref 12–46)
MCH: 32.1 pg (ref 26.0–34.0)
MCHC: 32.5 g/dL (ref 30.0–36.0)
MCV: 98.8 fL (ref 78.0–100.0)
MONO ABS: 0.4 10*3/uL (ref 0.1–1.0)
MONOS PCT: 12 % (ref 3–12)
NEUTROS ABS: 2.1 10*3/uL (ref 1.7–7.7)
Neutrophils Relative %: 60 % (ref 43–77)
PLATELETS: 135 10*3/uL — AB (ref 150–400)
RBC: 3.3 MIL/uL — ABNORMAL LOW (ref 3.87–5.11)
RDW: 14.4 % (ref 11.5–15.5)
WBC: 3.5 10*3/uL — ABNORMAL LOW (ref 4.0–10.5)

## 2015-02-01 NOTE — Progress Notes (Addendum)
Saw Dr.McLean --01-17-15 with report in epic   Echo report in epic from 2016  Stress test denies ever having one  Heart cath denies ever having one  Medical Md is Dr.Gretchen Velazquez  EKG denies having one in past yr  CXR in epic from 11-29-14

## 2015-02-01 NOTE — Pre-Procedure Instructions (Signed)
Natasha Torres  02/01/2015      [id number] [place], [place] - [address] [place]  [address] Phone: [phone] Fax: [place], [place] [address] Phone: [phone] Fax: [phone]    Your procedure is scheduled on Thurs, Aug 25 @ 7:30 AM  Report to [facility] Admitting at 5:30 AM  Call this number if you have problems the morning of surgery:  [phone]   Remember:  Do not eat food or drink liquids after midnight.  Take these medicines the morning of surgery with A SIP OF WATER Carvedilol(Coreg) andAtivan(Lorazepam)              Stop taking your Ibuprofen,Vitamins,and any Herbal Medications.    Do not wear jewelry, make-up or nail polish.  Do not wear lotions, powders, or perfumes.  You may wear deodorant.  Do not shave 48 hours prior to surgery.    Do not bring valuables to the hospital.  [facility] is not responsible for any belongings or valuables.  Contacts, dentures or bridgework may not be worn into surgery.  Leave your suitcase in the car.  After surgery it may be brought to your room.  For patients admitted to the hospital, discharge time will be determined by your treatment team.  Patients discharged the day of surgery will not be allowed to drive home.    Special instructions:   - Preparing for Surgery  Before surgery, you can play an important role.  Because skin is not sterile, your skin needs to be as free of germs as possible.  You can reduce the number of germs on you skin by washing with CHG (chlorahexidine gluconate) soap before surgery.  CHG is an antiseptic cleaner which kills germs and bonds with the skin to continue killing germs even after washing.  Please DO NOT use if you have an allergy to CHG or antibacterial soaps.  If your skin becomes reddened/irritated stop using the CHG  and inform your nurse when you arrive at Short Stay.  Do not shave (including legs and underarms) for at least 48 hours prior to the first CHG shower.  You may shave your face.  Please follow these instructions carefully:   1.  Shower with CHG Soap the night before surgery and the                                morning of Surgery.  2.  If you choose to wash your hair, wash your hair first as usual with your       normal shampoo.  3.  After you shampoo, rinse your hair and body thoroughly to remove the                      Shampoo.  4.  Use CHG as you would any other liquid soap.  You can apply chg directly       to the skin and wash gently with scrungie or a clean washcloth.  5.  Apply the CHG Soap to your body ONLY FROM THE NECK DOWN.        Do not use on open wounds or open sores.  Avoid contact with your eyes,       ears, mouth and genitals (private parts).  Wash  genitals (private parts)       with your normal soap.  6.  Wash thoroughly, paying special attention to the area where your surgery        will be performed.  7.  Thoroughly rinse your body with warm water from the neck down.  8.  DO NOT shower/wash with your normal soap after using and rinsing off       the CHG Soap.  9.  Pat yourself dry with a clean towel.            10.  Wear clean pajamas.            11.  Place clean sheets on your bed the night of your first shower and do not        sleep with pets.  Day of Surgery  Do not apply any lotions/deoderants the morning of surgery.  Please wear clean clothes to the hospital/surgery center.    Please read over the following fact sheets that you were given. Pain Booklet, Coughing and Deep Breathing and Surgical Site Infection Prevention

## 2015-02-02 NOTE — Progress Notes (Addendum)
Anesthesia Chart Review: Patient is a 34 year old scheduled for right prophylactic nipple sparring mastectomy, left nipple sparing mastectomy, left axillary SN biopsy (Dr. Donne Hazel) and bilateral breast reconstruction with placement of tissue expanders, possible acellular dermis (Dr. Iran Planas) on 02/10/15.   History includes non-smoker, breast cancer 07/2014 s/p chemo (carboplatin, docetaxel, Herceptin, pertuzumab; completed treatment but continuing Herceptin X 1 year total), HTN, GERD associated with chemotherapy, insomnia, anxiety, right oophorectomy. Mom gets "sick" with anesthesia. Primary oncologist is Dr. Lindi Adie.   She was recently established at [facility] on 01/18/15 with Dr. Aundra Dubin for evaluation of fall in her EF and strain by echo monitoring while on Herceptin. (She had an echo in 4/16 showed EF 55-60% with GLS -25%. However, repeat echo in 6/16 showed fall in EF to 50-55% with GLS fallen to -17%.) He is aware of plans for mastectomy. Patient is scheduled for a follow-up echo and office visit tomorrow.   Meds include candesartan, Coreg, Ativan, Compazine, Ambien.   02/01/15 EKG: NSR.  11/29/14 CXR: IMPRESSION: No active cardiopulmonary disease.  Preoperative labs noted. BMET WNL. WBC 3.5, H/H 10.6/32.6, 135K.  Will revisit chart once follow-up cardiology notes/tests available.  George Hugh [facility] Short Stay Center/Anesthesiology Phone [phone] 02/02/2015 3:48 PM  Addendum:  02/03/15 Echo: - Left ventricle: The cavity size was normal. Wall thickness was normal. The estimated ejection fraction was 45%. Diffuse hypokinesis. Left ventricular diastolic function parameters were normal. Lateral s&' 13.5 cm/sec. GLS -18.6%. - Aortic valve: Transvalvular velocity was within the normal range. There was no stenosis. - Mitral valve: There was trivial regurgitation. - Right ventricle: The cavity size was normal. Systolic function was normal. -  Pulmonary arteries: No complete TR doppler jet so unable to estimate PA systolic pressure. - Inferior vena cava: The vessel was normal in size. The respirophasic diameter changes were in the normal range (>= 50%), consistent with normal central venous pressure. Impressions: - Normal LV size with EF 45%, diffuse hypokinesis. Strain and lateral s&' as above. Normal RV size and systolic function. No significant valvular abnormalities.  Her echo showed a drop in her EF from 50-55% to 45% with strain stable when compared to her 12/02/14 echo. He recommended holding Herceptin X 6 weeks then repeat echo at that time. If function is stable to improved, can re-challenge with Herceptin at that time. He increased her Coreg.  He is aware of her plans for surgery next week. Would anticipate that if no acute changes then she could proceed to the OR as planned.  George Hugh [facility] Short Stay Center/Anesthesiology Phone [phone] 02/04/2015 9:50 AM

## 2015-02-03 ENCOUNTER — Encounter: Payer: Self-pay | Admitting: *Deleted

## 2015-02-03 ENCOUNTER — Telehealth: Payer: Self-pay | Admitting: Hematology and Oncology

## 2015-02-03 ENCOUNTER — Ambulatory Visit (HOSPITAL_COMMUNITY)
Admission: RE | Admit: 2015-02-03 | Discharge: 2015-02-03 | Disposition: A | Discharge status: Home / Self Care | Payer: Managed Care, Other (non HMO) | Source: Ambulatory Visit | Attending: Cardiology | Admitting: Cardiology

## 2015-02-03 ENCOUNTER — Ambulatory Visit (HOSPITAL_BASED_OUTPATIENT_CLINIC_OR_DEPARTMENT_OTHER)
Admission: RE | Admit: 2015-02-03 | Discharge: 2015-02-03 | Disposition: A | Discharge status: Home / Self Care | Payer: Managed Care, Other (non HMO) | Source: Ambulatory Visit | Attending: Cardiology | Admitting: Cardiology

## 2015-02-03 VITALS — BP 106/76 | HR 76 | Wt 189.8 lb

## 2015-02-03 DIAGNOSIS — Z01818 Encounter for other preprocedural examination: Secondary | ICD-10-CM | POA: Insufficient documentation

## 2015-02-03 DIAGNOSIS — C50912 Malignant neoplasm of unspecified site of left female breast: Secondary | ICD-10-CM | POA: Insufficient documentation

## 2015-02-03 DIAGNOSIS — C50919 Malignant neoplasm of unspecified site of unspecified female breast: Secondary | ICD-10-CM

## 2015-02-03 DIAGNOSIS — Z8249 Family history of ischemic heart disease and other diseases of the circulatory system: Secondary | ICD-10-CM | POA: Insufficient documentation

## 2015-02-03 DIAGNOSIS — I429 Cardiomyopathy, unspecified: Secondary | ICD-10-CM | POA: Insufficient documentation

## 2015-02-03 DIAGNOSIS — Z170 Estrogen receptor positive status [ER+]: Secondary | ICD-10-CM | POA: Insufficient documentation

## 2015-02-03 DIAGNOSIS — Z79899 Other long term (current) drug therapy: Secondary | ICD-10-CM | POA: Insufficient documentation

## 2015-02-03 DIAGNOSIS — C50412 Malignant neoplasm of upper-outer quadrant of left female breast: Secondary | ICD-10-CM | POA: Diagnosis not present

## 2015-02-03 DIAGNOSIS — I428 Other cardiomyopathies: Secondary | ICD-10-CM

## 2015-02-03 MED ORDER — CARVEDILOL 6.25 MG PO TABS: 6.2500 mg | ORAL_TABLET | Freq: Two times a day (BID) | ORAL | Status: DC

## 2015-02-03 NOTE — Progress Notes (Signed)
Patient ID: Natasha Torres, female   DOB: April 02, 1981, 34 y.o.   MRN: [id number] Primary oncologist: Dr Lindi Adie  34 yo with left breast cancer presents for evaluation of fall in EF and strain by echo monitoring.  Breast cancer was diagnosed in 2/16.  ER+/PR-/HER2+.  Treated with neoadjuvant chemo (carboplatin, docetaxel, Herceptin, and pertuzumab).  She completed this treatment and is now continuing on Herceptin alone to complete 1 year of treatment. She will have a mastectomy next week.   She had an echo in 4/16 showed EF 55-60% with GLS -25%.  However, repeat echo in 6/16 showed fall in EF to 50-55% with GLS fallen to -17%.  At last appointment, I started her on low dose Coreg and candesartan.  She has tolerated these meds well so far.  Echo repeated today showed EF 45% with stable strain indices.  She feels well in general.  No exertional dyspnea, chest pain, orthopnea, PND, palpitations.  No lightheadedness.  She has no history of cardiac problems.  No family history of cardiomyopathy or early coronary disease.   Labs (7/16): K 4, creatinine 0.7 Labs (8/16): K 4.3, creatinine 0.64  PMH: 1. Breast cancer: On left, diagnosed 2/16.  ER+/PR-/HER2+.  Treated with neoadjuvant chemo (carboplatin, docetaxel, Herceptin, and pertuzumab).  She completed this treatment and is now continuing on Herceptin alone to complete 1 year of treatment.  2. 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  - Echo (4/16) with EF 55-60%, GLS -25%. - Echo (6/16) with EF 50-55%, GLS -17% - Echo (8/16) with EF 45%, GLS -18.6%, lateral s' 13.5 cm/sec  Social History   Social History  . Marital Status: Married    Spouse Name: Harrell Gave  . Number of Children: 1  . Years of Education: N/A   Social History Main Topics  . Smoking status: Never Smoker   . Smokeless tobacco: Not on file  . Alcohol Use: 0.0 oz/week    0 Standard drinks or equivalent per week     Comment: rarely  . Drug Use: No  . Sexual Activity: Yes    Other Topics Concern  . Not on file   Social History Narrative   ** Merged History Encounter **       Family History  Problem Relation Age of Onset  . Hypertension Mother   . Depression Maternal Aunt   . Cancer Maternal Grandfather   . Cancer Paternal Grandfather   . Prostate cancer Father 33  . Dementia Maternal Grandmother   . Heart attack Maternal Grandfather   . Dementia Paternal Grandmother   . Kidney cancer Paternal Grandmother     slow growing, no treatment  . Prostate cancer Paternal Grandfather 37  . Bone cancer Paternal Grandfather 34  . Breast cancer Paternal Grandfather 61  . Lung cancer Paternal Grandfather     dx late 56s; smoker.  thought to be a 4th primary cancer  . Lung cancer Other     smoker  . Prostate cancer Other     MGMs 1/2 brother   ROS: All systems reviewed and negative except as per HPI.  Current Outpatient Prescriptions  Medication Sig Dispense Refill  . BIOTIN PO Take 2 tablets by mouth daily.    . candesartan (ATACAND) 4 MG tablet Take 1 tablet (4 mg total) by mouth every evening. 30 tablet 3  . carvedilol (COREG) 6.25 MG tablet Take 1 tablet (6.25 mg total) by mouth 2 (two) times daily with a meal. 60 tablet 3  . docusate sodium (COLACE)  100 MG capsule Take 100 mg by mouth daily as needed for mild constipation.    [name] ibuprofen (ADVIL,MOTRIN) 200 MG tablet Take 200-400 mg by mouth every 6 (six) hours as needed for headache, mild pain or moderate pain.    [name] lidocaine-prilocaine (EMLA) cream Apply to affected area once (Patient taking differently: Apply 1 application topically as needed (pain). Apply to affected area once) 30 g 3  . loperamide (IMODIUM) 2 MG capsule Take 2 mg by mouth as needed for diarrhea or loose stools.    [name] LORazepam (ATIVAN) 0.5 MG tablet Take 1 tablet (0.5 mg total) by mouth every 8 (eight) hours as needed. (Patient taking differently: Take 0.5 mg by mouth every 8 (eight) hours as needed for anxiety. ) 60 tablet 0  .  Multiple Vitamin (MULTIVITAMIN) tablet Take 2 tablets by mouth daily.     . Probiotic Product (PROBIOTIC DAILY PO) Take 1 capsule by mouth daily. Ultimate Flora    . prochlorperazine (COMPAZINE) 10 MG tablet Take 1 tablet (10 mg total) by mouth every 6 (six) hours as needed (Nausea or vomiting). 30 tablet 1  . UNABLE TO FIND 1 each by Other route daily. 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 zolpidem (AMBIEN) 5 MG tablet Take 5 mg by mouth at bedtime as needed for sleep.     No current facility-administered medications for this encounter.   BP 106/76 mmHg  Pulse 76  Wt 189 lb 12.8 oz (86.093 kg)  SpO2 99% General: NAD Neck: No JVD, no thyromegaly or thyroid nodule.  Lungs: Clear to auscultation bilaterally with normal respiratory effort. CV: Nondisplaced PMI.  Heart regular S1/S2, no S3/S4, no murmur.  No peripheral edema.  No carotid bruit.  Normal pedal pulses.  Abdomen: Soft, nontender, no hepatosplenomegaly, no distention.  Skin: Intact without lesions or rashes.  Neurologic: Alert and oriented x 3.  Psych: Normal affect. Extremities: No clubbing or cyanosis.  HEENT: Normal.   Assessment/plan: 34 yo with history of breast cancer getting Herceptin to complete 1 year of therapy.  Recent echo in 6/16 showed a fall in EF from 55-60% to 50-55%.  There was also a significant fall in global lateral strain, portending the possibility of more profound fall in EF over time.  This may be related to Herceptin.  Repeat echo today showed EF 45%, strain stable compared to 6/16.  - EF remains lower than baseline.  I would recommend holding Herceptin x 6 weeks then repeating echo at that time.  If function is stable to improved, can re-challenge with Herceptin at that time.  - Increase Coreg to 6.25 mg bid, continue candesartan.   Loralie Champagne 02/03/2015

## 2015-02-03 NOTE — Progress Notes (Signed)
Received notification from pt after her appt with Dr. Aundra Dubin that he recommends to hold herceptin for 6 wks and re-evaluate with echo in 6 wks. Herceptin infusion requested to be cancelled for tomorrow.

## 2015-02-03 NOTE — Telephone Encounter (Signed)
Per 08/18 POF cancel herceptin treatment 08/19 d/t decrease cardiac function... KJ

## 2015-02-03 NOTE — Patient Instructions (Signed)
INCREASE Coreg to 6.25 mg, one tab twice a day  Your physician has requested that you have an echocardiogram. Echocardiography is a painless test that uses sound waves to create images of your heart. It provides your doctor with information about the size and shape of your heart and how well your heart's chambers and valves are working. This procedure takes approximately one hour. There are no restrictions for this procedure.  Your physician recommends that you schedule a follow-up appointment in:  6 weeks with echo

## 2015-02-03 NOTE — Progress Notes (Signed)
  Echocardiogram 2D Echocardiogram has been performed.  Bobbye Charleston 02/03/2015, 11:10 AM

## 2015-02-04 ENCOUNTER — Ambulatory Visit: Payer: Self-pay

## 2015-02-09 MED ORDER — CIPROFLOXACIN IN D5W 400 MG/200ML IV SOLN
400.0000 mg | INTRAVENOUS | Status: AC
  Administered 2015-02-10: 400 mg via INTRAVENOUS
  Filled 2015-02-09: qty 200

## 2015-02-10 ENCOUNTER — Encounter (HOSPITAL_COMMUNITY): Payer: Self-pay | Admitting: *Deleted

## 2015-02-10 ENCOUNTER — Ambulatory Visit (HOSPITAL_COMMUNITY): Payer: Managed Care, Other (non HMO) | Admitting: Anesthesiology

## 2015-02-10 ENCOUNTER — Ambulatory Visit (HOSPITAL_COMMUNITY): Payer: Managed Care, Other (non HMO) | Admitting: Vascular Surgery

## 2015-02-10 ENCOUNTER — Ambulatory Visit (HOSPITAL_COMMUNITY)
Admission: RE | Admit: 2015-02-10 | Discharge: 2015-02-10 | Disposition: A | Discharge status: Home / Self Care | Payer: Managed Care, Other (non HMO) | Source: Ambulatory Visit | Attending: General Surgery | Admitting: General Surgery

## 2015-02-10 ENCOUNTER — Observation Stay (HOSPITAL_COMMUNITY)
Admission: RE | Admit: 2015-02-10 | Discharge: 2015-02-11 | Disposition: A | Discharge status: Home / Self Care | Payer: Managed Care, Other (non HMO) | Source: Ambulatory Visit | Attending: General Surgery | Admitting: General Surgery

## 2015-02-10 ENCOUNTER — Encounter (HOSPITAL_COMMUNITY)
Admission: RE | Disposition: A | Discharge status: Home / Self Care | Payer: Self-pay | Source: Ambulatory Visit | Attending: General Surgery

## 2015-02-10 DIAGNOSIS — Z79899 Other long term (current) drug therapy: Secondary | ICD-10-CM | POA: Insufficient documentation

## 2015-02-10 DIAGNOSIS — Z9013 Acquired absence of bilateral breasts and nipples: Secondary | ICD-10-CM

## 2015-02-10 DIAGNOSIS — D0512 Intraductal carcinoma in situ of left breast: Principal | ICD-10-CM | POA: Insufficient documentation

## 2015-02-10 DIAGNOSIS — C50412 Malignant neoplasm of upper-outer quadrant of left female breast: Secondary | ICD-10-CM | POA: Insufficient documentation

## 2015-02-10 DIAGNOSIS — C50912 Malignant neoplasm of unspecified site of left female breast: Secondary | ICD-10-CM | POA: Diagnosis present

## 2015-02-10 DIAGNOSIS — Z4001 Encounter for prophylactic removal of breast: Secondary | ICD-10-CM | POA: Insufficient documentation

## 2015-02-10 DIAGNOSIS — Z880 Allergy status to penicillin: Secondary | ICD-10-CM | POA: Insufficient documentation

## 2015-02-10 DIAGNOSIS — Z170 Estrogen receptor positive status [ER+]: Secondary | ICD-10-CM | POA: Insufficient documentation

## 2015-02-10 HISTORY — PX: MASTECTOMY: SHX3

## 2015-02-10 HISTORY — PX: BREAST RECONSTRUCTION WITH PLACEMENT OF TISSUE EXPANDER AND FLEX HD (ACELLULAR HYDRATED DERMIS): SHX6295

## 2015-02-10 HISTORY — PX: NIPPLE SPARING MASTECTOMY/SENTINAL LYMPH NODE BIOPSY/RECONSTRUCTION/PLACEMENT OF TISSUE EXPANDER: SHX6484

## 2015-02-10 SURGERY — NIPPLE SPARING MASTECTOMY WITH SENTINAL LYMPH NODE BIOPSY AND  RECONSTRUCTION WITH PLACEMENT OF TISSUE EXPANDER
Anesthesia: Regional | Site: Breast | Laterality: Bilateral

## 2015-02-10 MED ORDER — KCL IN DEXTROSE-NACL 20-5-0.45 MEQ/L-%-% IV SOLN
INTRAVENOUS | Status: DC
  Administered 2015-02-10 – 2015-02-11 (×2): via INTRAVENOUS
  Filled 2015-02-10 (×2): qty 1000

## 2015-02-10 MED ORDER — CARVEDILOL 6.25 MG PO TABS
6.2500 mg | ORAL_TABLET | Freq: Two times a day (BID) | ORAL | Status: DC
  Administered 2015-02-10 – 2015-02-11 (×2): 6.25 mg via ORAL
  Filled 2015-02-10 (×3): qty 1

## 2015-02-10 MED ORDER — NAPROXEN 250 MG PO TABS: 500.0000 mg | ORAL_TABLET | Freq: Two times a day (BID) | ORAL | Status: DC | PRN

## 2015-02-10 MED ORDER — VECURONIUM BROMIDE 10 MG IV SOLR
INTRAVENOUS | Status: DC | PRN
  Administered 2015-02-10 (×2): 3 mg via INTRAVENOUS
  Administered 2015-02-10: 2 mg via INTRAVENOUS

## 2015-02-10 MED ORDER — GLYCOPYRROLATE 0.2 MG/ML IJ SOLN
INTRAMUSCULAR | Status: AC
  Filled 2015-02-10: qty 4

## 2015-02-10 MED ORDER — METHYLENE BLUE 1 % INJ SOLN
INTRAMUSCULAR | Status: AC
  Filled 2015-02-10: qty 10

## 2015-02-10 MED ORDER — ONDANSETRON HCL 4 MG/2ML IJ SOLN
INTRAMUSCULAR | Status: DC | PRN
  Administered 2015-02-10: 4 mg via INTRAVENOUS

## 2015-02-10 MED ORDER — DIAZEPAM 2 MG PO TABS
2.0000 mg | ORAL_TABLET | Freq: Three times a day (TID) | ORAL | Status: DC | PRN
Start: 2015-02-10 — End: 2015-02-11
  Administered 2015-02-10: 2 mg via ORAL

## 2015-02-10 MED ORDER — PROPOFOL 10 MG/ML IV BOLUS
INTRAVENOUS | Status: AC
  Filled 2015-02-10: qty 20

## 2015-02-10 MED ORDER — ONDANSETRON 4 MG PO TBDP: 4.0000 mg | ORAL_TABLET | Freq: Four times a day (QID) | ORAL | Status: DC | PRN

## 2015-02-10 MED ORDER — KETOROLAC TROMETHAMINE 30 MG/ML IJ SOLN
30.0000 mg | Freq: Three times a day (TID) | INTRAMUSCULAR | Status: AC
  Administered 2015-02-10 – 2015-02-11 (×3): 30 mg via INTRAVENOUS
  Filled 2015-02-10 (×2): qty 1

## 2015-02-10 MED ORDER — PHENYLEPHRINE HCL 10 MG/ML IJ SOLN
INTRAMUSCULAR | Status: AC
  Filled 2015-02-10: qty 2

## 2015-02-10 MED ORDER — SODIUM CHLORIDE 0.9 % IV SOLN
Freq: Once | INTRAVENOUS | Status: AC
  Administered 2015-02-10: 1000 mL
  Filled 2015-02-10: qty 1

## 2015-02-10 MED ORDER — PHENYLEPHRINE 40 MCG/ML (10ML) SYRINGE FOR IV PUSH (FOR BLOOD PRESSURE SUPPORT)
PREFILLED_SYRINGE | INTRAVENOUS | Status: AC
  Filled 2015-02-10: qty 10

## 2015-02-10 MED ORDER — 0.9 % SODIUM CHLORIDE (POUR BTL) OPTIME
TOPICAL | Status: DC | PRN
  Administered 2015-02-10: 1000 mL

## 2015-02-10 MED ORDER — NEOSTIGMINE METHYLSULFATE 10 MG/10ML IV SOLN
INTRAVENOUS | Status: AC
  Filled 2015-02-10: qty 1

## 2015-02-10 MED ORDER — ROCURONIUM BROMIDE 100 MG/10ML IV SOLN
INTRAVENOUS | Status: DC | PRN
  Administered 2015-02-10: 50 mg via INTRAVENOUS

## 2015-02-10 MED ORDER — IRBESARTAN 75 MG PO TABS
37.5000 mg | ORAL_TABLET | Freq: Every day | ORAL | Status: DC
  Filled 2015-02-10: qty 1

## 2015-02-10 MED ORDER — LIDOCAINE HCL (CARDIAC) 20 MG/ML IV SOLN
INTRAVENOUS | Status: AC
  Filled 2015-02-10: qty 10

## 2015-02-10 MED ORDER — ARTIFICIAL TEARS OP OINT
TOPICAL_OINTMENT | OPHTHALMIC | Status: DC | PRN
  Administered 2015-02-10: 1 via OPHTHALMIC

## 2015-02-10 MED ORDER — SODIUM CHLORIDE 0.9 % IJ SOLN
INTRAMUSCULAR | Status: DC | PRN
  Administered 2015-02-10: 5 mL via INTRAMUSCULAR

## 2015-02-10 MED ORDER — ACETAMINOPHEN 325 MG PO TABS: 650.0000 mg | ORAL_TABLET | Freq: Four times a day (QID) | ORAL | Status: DC | PRN

## 2015-02-10 MED ORDER — MIDAZOLAM HCL 2 MG/2ML IJ SOLN
INTRAMUSCULAR | Status: AC
  Filled 2015-02-10: qty 4

## 2015-02-10 MED ORDER — SODIUM CHLORIDE 0.9 % IV SOLN: INTRAVENOUS | Status: DC

## 2015-02-10 MED ORDER — SCOPOLAMINE 1 MG/3DAYS TD PT72
MEDICATED_PATCH | TRANSDERMAL | Status: DC | PRN
  Administered 2015-02-10: 1 via TRANSDERMAL

## 2015-02-10 MED ORDER — ONDANSETRON HCL 4 MG/2ML IJ SOLN: 4.0000 mg | Freq: Four times a day (QID) | INTRAMUSCULAR | Status: DC | PRN

## 2015-02-10 MED ORDER — ONDANSETRON HCL 4 MG/2ML IJ SOLN
INTRAMUSCULAR | Status: AC
  Filled 2015-02-10: qty 2

## 2015-02-10 MED ORDER — TECHNETIUM TC 99M SULFUR COLLOID FILTERED
1.0000 | Freq: Once | INTRAVENOUS | Status: AC | PRN
  Administered 2015-02-10: 1 via INTRADERMAL

## 2015-02-10 MED ORDER — DEXAMETHASONE SODIUM PHOSPHATE 4 MG/ML IJ SOLN
INTRAMUSCULAR | Status: DC | PRN
  Administered 2015-02-10: 8 mg via INTRAVENOUS

## 2015-02-10 MED ORDER — DEXTROSE 5 % IV SOLN
10.0000 mg | INTRAVENOUS | Status: DC | PRN
  Administered 2015-02-10: 20 ug/min via INTRAVENOUS

## 2015-02-10 MED ORDER — ALBUMIN HUMAN 5 % IV SOLN
INTRAVENOUS | Status: DC | PRN
  Administered 2015-02-10 (×2): via INTRAVENOUS

## 2015-02-10 MED ORDER — 0.9 % SODIUM CHLORIDE (POUR BTL) OPTIME: TOPICAL | Status: DC | PRN

## 2015-02-10 MED ORDER — HYDROMORPHONE HCL 1 MG/ML IJ SOLN
0.5000 mg | INTRAMUSCULAR | Status: DC | PRN
  Administered 2015-02-10 – 2015-02-11 (×5): 1 mg via INTRAVENOUS
  Filled 2015-02-10 (×5): qty 1

## 2015-02-10 MED ORDER — OXYCODONE HCL 5 MG PO TABS: 5.0000 mg | ORAL_TABLET | ORAL | Status: DC | PRN

## 2015-02-10 MED ORDER — HYDROMORPHONE HCL 1 MG/ML IJ SOLN
0.2500 mg | INTRAMUSCULAR | Status: DC | PRN
  Administered 2015-02-10 (×2): 0.5 mg via INTRAVENOUS

## 2015-02-10 MED ORDER — MIDAZOLAM HCL 5 MG/5ML IJ SOLN
INTRAMUSCULAR | Status: DC | PRN
  Administered 2015-02-10 (×2): 2 mg via INTRAVENOUS

## 2015-02-10 MED ORDER — HYDROMORPHONE HCL 1 MG/ML IJ SOLN: 0.5000 mg | INTRAMUSCULAR | Status: DC | PRN

## 2015-02-10 MED ORDER — KETOROLAC TROMETHAMINE 30 MG/ML IJ SOLN
INTRAMUSCULAR | Status: AC
  Filled 2015-02-10: qty 1

## 2015-02-10 MED ORDER — SCOPOLAMINE 1 MG/3DAYS TD PT72
MEDICATED_PATCH | TRANSDERMAL | Status: AC
  Filled 2015-02-10: qty 1

## 2015-02-10 MED ORDER — FENTANYL CITRATE (PF) 100 MCG/2ML IJ SOLN
INTRAMUSCULAR | Status: DC | PRN
  Administered 2015-02-10: 100 ug via INTRAVENOUS
  Administered 2015-02-10: 50 ug via INTRAVENOUS
  Administered 2015-02-10: 100 ug via INTRAVENOUS

## 2015-02-10 MED ORDER — EPHEDRINE SULFATE 50 MG/ML IJ SOLN
INTRAMUSCULAR | Status: DC | PRN
  Administered 2015-02-10: 10 mg via INTRAVENOUS
  Administered 2015-02-10 (×6): 5 mg via INTRAVENOUS

## 2015-02-10 MED ORDER — SODIUM CHLORIDE 0.9 % IJ SOLN
INTRAMUSCULAR | Status: AC
  Filled 2015-02-10: qty 10

## 2015-02-10 MED ORDER — PROPOFOL 10 MG/ML IV BOLUS
INTRAVENOUS | Status: DC | PRN
  Administered 2015-02-10: 200 mg via INTRAVENOUS

## 2015-02-10 MED ORDER — OXYCODONE HCL 5 MG/5ML PO SOLN: 5.0000 mg | Freq: Once | ORAL | Status: DC | PRN

## 2015-02-10 MED ORDER — DOCUSATE SODIUM 100 MG PO CAPS
100.0000 mg | ORAL_CAPSULE | Freq: Two times a day (BID) | ORAL | Status: DC
  Administered 2015-02-10 – 2015-02-11 (×3): 100 mg via ORAL
  Filled 2015-02-10 (×3): qty 1

## 2015-02-10 MED ORDER — OXYCODONE HCL 5 MG PO TABS: 5.0000 mg | ORAL_TABLET | Freq: Once | ORAL | Status: DC | PRN

## 2015-02-10 MED ORDER — IRBESARTAN 75 MG PO TABS
37.5000 mg | ORAL_TABLET | Freq: Every day | ORAL | Status: DC
  Administered 2015-02-10: 37.5 mg via ORAL
  Filled 2015-02-10: qty 1

## 2015-02-10 MED ORDER — LACTATED RINGERS IV SOLN
INTRAVENOUS | Status: DC | PRN
  Administered 2015-02-10 (×3): via INTRAVENOUS

## 2015-02-10 MED ORDER — LORAZEPAM 0.5 MG PO TABS: 0.5000 mg | ORAL_TABLET | Freq: Three times a day (TID) | ORAL | Status: DC | PRN

## 2015-02-10 MED ORDER — PROMETHAZINE HCL 25 MG/ML IJ SOLN: 6.2500 mg | INTRAMUSCULAR | Status: DC | PRN

## 2015-02-10 MED ORDER — GLYCOPYRROLATE 0.2 MG/ML IJ SOLN
INTRAMUSCULAR | Status: DC | PRN
  Administered 2015-02-10 (×2): 0.1 mg via INTRAVENOUS
  Administered 2015-02-10: 0.6 mg via INTRAVENOUS

## 2015-02-10 MED ORDER — NEOSTIGMINE METHYLSULFATE 10 MG/10ML IV SOLN
INTRAVENOUS | Status: DC | PRN
  Administered 2015-02-10: 4 mg via INTRAVENOUS

## 2015-02-10 MED ORDER — DIPHENHYDRAMINE HCL 25 MG PO CAPS
25.0000 mg | ORAL_CAPSULE | Freq: Four times a day (QID) | ORAL | Status: DC | PRN
  Administered 2015-02-10: 25 mg via ORAL
  Filled 2015-02-10 (×2): qty 1

## 2015-02-10 MED ORDER — SODIUM CHLORIDE 0.9 % IR SOLN
Status: DC | PRN
  Administered 2015-02-10: 1000 mL

## 2015-02-10 MED ORDER — BUPIVACAINE-EPINEPHRINE (PF) 0.25% -1:200000 IJ SOLN
INTRAMUSCULAR | Status: DC | PRN
  Administered 2015-02-10 (×2): 30 mL

## 2015-02-10 MED ORDER — FENTANYL CITRATE (PF) 250 MCG/5ML IJ SOLN
INTRAMUSCULAR | Status: AC
  Filled 2015-02-10: qty 5

## 2015-02-10 MED ORDER — SUCCINYLCHOLINE CHLORIDE 20 MG/ML IJ SOLN
INTRAMUSCULAR | Status: AC
  Filled 2015-02-10: qty 1

## 2015-02-10 MED ORDER — ZOLPIDEM TARTRATE 5 MG PO TABS: 5.0000 mg | ORAL_TABLET | Freq: Every evening | ORAL | Status: DC | PRN

## 2015-02-10 MED ORDER — STERILE WATER FOR INJECTION IJ SOLN
INTRAMUSCULAR | Status: AC
  Filled 2015-02-10: qty 10

## 2015-02-10 MED ORDER — CIPROFLOXACIN IN D5W 400 MG/200ML IV SOLN
400.0000 mg | Freq: Two times a day (BID) | INTRAVENOUS | Status: DC
  Administered 2015-02-10 – 2015-02-11 (×3): 400 mg via INTRAVENOUS
  Filled 2015-02-10 (×3): qty 200

## 2015-02-10 MED ORDER — DIAZEPAM 5 MG PO TABS
ORAL_TABLET | ORAL | Status: AC
  Filled 2015-02-10: qty 1

## 2015-02-10 MED ORDER — ARTIFICIAL TEARS OP OINT
TOPICAL_OINTMENT | OPHTHALMIC | Status: AC
  Filled 2015-02-10: qty 3.5

## 2015-02-10 MED ORDER — MIDAZOLAM HCL 2 MG/2ML IJ SOLN
INTRAMUSCULAR | Status: AC
  Filled 2015-02-10: qty 2

## 2015-02-10 MED ORDER — EPHEDRINE SULFATE 50 MG/ML IJ SOLN
INTRAMUSCULAR | Status: AC
  Filled 2015-02-10: qty 1

## 2015-02-10 MED ORDER — LIDOCAINE HCL (CARDIAC) 20 MG/ML IV SOLN
INTRAVENOUS | Status: DC | PRN
  Administered 2015-02-10: 60 mg via INTRAVENOUS
  Administered 2015-02-10: 60 mg via INTRATRACHEAL

## 2015-02-10 MED ORDER — HYDROMORPHONE HCL 1 MG/ML IJ SOLN
INTRAMUSCULAR | Status: AC
  Filled 2015-02-10: qty 1

## 2015-02-10 MED ORDER — DEXAMETHASONE SODIUM PHOSPHATE 4 MG/ML IJ SOLN
INTRAMUSCULAR | Status: AC
  Filled 2015-02-10: qty 2

## 2015-02-10 MED ORDER — ROCURONIUM BROMIDE 50 MG/5ML IV SOLN
INTRAVENOUS | Status: AC
  Filled 2015-02-10: qty 1

## 2015-02-10 MED ORDER — HYDROCODONE-ACETAMINOPHEN 5-325 MG PO TABS
1.0000 | ORAL_TABLET | ORAL | Status: DC | PRN
  Administered 2015-02-11: 1 via ORAL
  Filled 2015-02-10: qty 1

## 2015-02-10 MED ORDER — ACETAMINOPHEN 650 MG RE SUPP: 650.0000 mg | Freq: Four times a day (QID) | RECTAL | Status: DC | PRN

## 2015-02-10 SURGICAL SUPPLY — 84 items
APPLIER CLIP 9.375 MED OPEN (MISCELLANEOUS) ×2
APR CLP MED 9.3 20 MLT OPN (MISCELLANEOUS) ×1
BAG DECANTER FOR FLEXI CONT (MISCELLANEOUS) ×2 IMPLANT
BINDER BREAST LRG (GAUZE/BANDAGES/DRESSINGS) IMPLANT
BINDER BREAST XLRG (GAUZE/BANDAGES/DRESSINGS) ×1 IMPLANT
BLADE 15 SAFETY STRL DISP (BLADE) ×1 IMPLANT
CANISTER SUCTION 2500CC (MISCELLANEOUS) ×4 IMPLANT
CHLORAPREP W/TINT 26ML (MISCELLANEOUS) ×4 IMPLANT
CLIP APPLIE 9.375 MED OPEN (MISCELLANEOUS) IMPLANT
CONT SPEC 4OZ CLIKSEAL STRL BL (MISCELLANEOUS) ×8 IMPLANT
COVER PROBE W GEL 5X96 (DRAPES) ×2 IMPLANT
COVER SURGICAL LIGHT HANDLE (MISCELLANEOUS) ×4 IMPLANT
DEVICE DISSECT PLASMABLAD 3.0S (MISCELLANEOUS) ×1 IMPLANT
DRAIN CHANNEL 15F RND FF W/TCR (WOUND CARE) ×2 IMPLANT
DRAIN CHANNEL 19F RND (DRAIN) ×5 IMPLANT
DRAPE LAPAROSCOPIC ABDOMINAL (DRAPES) ×2 IMPLANT
DRAPE ORTHO SPLIT 77X108 STRL (DRAPES) ×4
DRAPE PROXIMA HALF (DRAPES) IMPLANT
DRAPE SURG ORHT 6 SPLT 77X108 (DRAPES) ×2 IMPLANT
DRAPE WARM FLUID 44X44 (DRAPE) ×2 IMPLANT
DRSG PAD ABDOMINAL 8X10 ST (GAUZE/BANDAGES/DRESSINGS) ×3 IMPLANT
DRSG TEGADERM 2-3/8X2-3/4 SM (GAUZE/BANDAGES/DRESSINGS) ×2 IMPLANT
DRSG TEGADERM 4X4.75 (GAUZE/BANDAGES/DRESSINGS) ×5 IMPLANT
ELECT BLADE 4.0 EZ CLEAN MEGAD (MISCELLANEOUS) ×4
ELECT BLADE 6.5 EXT (BLADE) ×2 IMPLANT
ELECT CAUTERY BLADE 6.4 (BLADE) ×2 IMPLANT
ELECT REM PT RETURN 9FT ADLT (ELECTROSURGICAL) ×6
ELECTRODE BLDE 4.0 EZ CLN MEGD (MISCELLANEOUS) ×2 IMPLANT
ELECTRODE REM PT RTRN 9FT ADLT (ELECTROSURGICAL) ×3 IMPLANT
EVACUATOR SILICONE 100CC (DRAIN) ×5 IMPLANT
EXPANDER BREAST 300CC (Expander) ×2 IMPLANT
GAUZE SPONGE 4X4 12PLY STRL (GAUZE/BANDAGES/DRESSINGS) ×4 IMPLANT
GLOVE BIO SURGEON STRL SZ 6 (GLOVE) ×8 IMPLANT
GLOVE BIO SURGEON STRL SZ 6.5 (GLOVE) ×1 IMPLANT
GLOVE BIO SURGEON STRL SZ7 (GLOVE) ×2 IMPLANT
GLOVE BIOGEL PI IND STRL 6.5 (GLOVE) IMPLANT
GLOVE BIOGEL PI IND STRL 7.0 (GLOVE) IMPLANT
GLOVE BIOGEL PI IND STRL 7.5 (GLOVE) ×1 IMPLANT
GLOVE BIOGEL PI INDICATOR 6.5 (GLOVE) ×2
GLOVE BIOGEL PI INDICATOR 7.0 (GLOVE) ×1
GLOVE BIOGEL PI INDICATOR 7.5 (GLOVE) ×1
GLOVE ECLIPSE 6.5 STRL STRAW (GLOVE) ×4 IMPLANT
GOWN STRL REUS W/ TWL LRG LVL3 (GOWN DISPOSABLE) ×5 IMPLANT
GOWN STRL REUS W/TWL LRG LVL3 (GOWN DISPOSABLE) ×10
GRAFT FLEX HD 4X16 THICK (Tissue Mesh) ×2 IMPLANT
KIT BASIN OR (CUSTOM PROCEDURE TRAY) ×4 IMPLANT
KIT MARKER MARGIN INK (KITS) ×2 IMPLANT
KIT ROOM TURNOVER OR (KITS) ×4 IMPLANT
LIGHT WAVEGUIDE WIDE FLAT (MISCELLANEOUS) ×2 IMPLANT
LIQUID BAND (GAUZE/BANDAGES/DRESSINGS) ×3 IMPLANT
MARKER SKIN DUAL TIP RULER LAB (MISCELLANEOUS) ×2 IMPLANT
NDL 18GX1X1/2 (RX/OR ONLY) (NEEDLE) IMPLANT
NDL HYPO 25GX1X1/2 BEV (NEEDLE) IMPLANT
NEEDLE 18GX1X1/2 (RX/OR ONLY) (NEEDLE) IMPLANT
NEEDLE HYPO 25GX1X1/2 BEV (NEEDLE) IMPLANT
NS IRRIG 1000ML POUR BTL (IV SOLUTION) ×6 IMPLANT
PACK GENERAL/GYN (CUSTOM PROCEDURE TRAY) ×4 IMPLANT
PAD ARMBOARD 7.5X6 YLW CONV (MISCELLANEOUS) ×4 IMPLANT
PIN SAFETY STERILE (MISCELLANEOUS) ×4 IMPLANT
PLASMABLADE 3.0S (MISCELLANEOUS) ×2
RUBBERBAND STERILE (MISCELLANEOUS) ×1 IMPLANT
SET ASEPTIC TRANSFER (MISCELLANEOUS) ×2 IMPLANT
SOLUTION BETADINE 4OZ (MISCELLANEOUS) ×2 IMPLANT
SPECIMEN JAR LARGE (MISCELLANEOUS) ×2 IMPLANT
SPECIMEN JAR X LARGE (MISCELLANEOUS) ×1 IMPLANT
SPONGE LAP 18X18 X RAY DECT (DISPOSABLE) ×3 IMPLANT
STAPLER VISISTAT 35W (STAPLE) ×2 IMPLANT
STRIP CLOSURE SKIN 1/2X4 (GAUZE/BANDAGES/DRESSINGS) ×2 IMPLANT
SUT ETHILON 2 0 FS 18 (SUTURE) ×4 IMPLANT
SUT MNCRL AB 4-0 PS2 18 (SUTURE) ×3 IMPLANT
SUT SILK 2 0 SH (SUTURE) ×3 IMPLANT
SUT VIC AB 3-0 54X BRD REEL (SUTURE) ×1 IMPLANT
SUT VIC AB 3-0 BRD 54 (SUTURE) ×2
SUT VIC AB 3-0 SH 18 (SUTURE) ×2 IMPLANT
SUT VIC AB 3-0 SH 27 (SUTURE) ×8
SUT VIC AB 3-0 SH 27X BRD (SUTURE) IMPLANT
SUT VICRYL 4-0 PS2 18IN ABS (SUTURE) ×2 IMPLANT
SYR BULB IRRIGATION 50ML (SYRINGE) ×1 IMPLANT
SYR CONTROL 10ML LL (SYRINGE) IMPLANT
TOWEL OR 17X24 6PK STRL BLUE (TOWEL DISPOSABLE) ×4 IMPLANT
TOWEL OR 17X26 10 PK STRL BLUE (TOWEL DISPOSABLE) ×4 IMPLANT
TRAY FOLEY CATH 16FRSI W/METER (SET/KITS/TRAYS/PACK) ×1 IMPLANT
TUBE CONNECTING 12X1/4 (SUCTIONS) ×2 IMPLANT
YANKAUER SUCT BULB TIP NO VENT (SUCTIONS) ×2 IMPLANT

## 2015-02-10 NOTE — Anesthesia Preprocedure Evaluation (Addendum)
Anesthesia Evaluation  Patient identified by MRN, date of birth, ID band Patient awake    Reviewed: Allergy & Precautions, NPO status , Patient's Chart, lab work & pertinent test results  Airway Mallampati: I  TM Distance: >3 FB Neck ROM: Full    Dental  (+) Dental Advisory Given   Pulmonary neg pulmonary ROS,  breath sounds clear to auscultation        Cardiovascular +CHF (NICM- EF 45% on 02/01/15 echo) Rhythm:Regular Rate:Normal     Neuro/Psych Anxiety negative neurological ROS     GI/Hepatic Neg liver ROS, GERD-  ,  Endo/Other  negative endocrine ROS  Renal/GU negative Renal ROS     Musculoskeletal negative musculoskeletal ROS (+)   Abdominal   Peds  Hematology  (+) anemia ,   Anesthesia Other Findings   Reproductive/Obstetrics                            Anesthesia Physical Anesthesia Plan  ASA: III  Anesthesia Plan: General and Regional   Post-op Pain Management: GA combined w/ Regional for post-op pain   Induction: Intravenous  Airway Management Planned: Oral ETT  Additional Equipment:   Intra-op Plan:   Post-operative Plan: Extubation in OR  Informed Consent: I have reviewed the patients History and Physical, chart, labs and discussed the procedure including the risks, benefits and alternatives for the proposed anesthesia with the patient or authorized representative who has indicated his/her understanding and acceptance.   Dental advisory given  Plan Discussed with: CRNA  Anesthesia Plan Comments:        Anesthesia Quick Evaluation

## 2015-02-10 NOTE — Anesthesia Postprocedure Evaluation (Signed)
  Anesthesia Post-op Note  Patient: Natasha Torres  Procedure(s) Performed: Procedure(s): BILATERAL  NIPPLE SPARING MASTECTOMY WITH LEFT  SENTINAL LYMPH NODE BIOPSY(RIGHT BREAST PROPHYLACTIC) (Bilateral) BREAST RECONSTRUCTION WITH PLACEMENT OF TISSUE EXPANDER AND FLEX HD (ACELLULAR HYDRATED DERMIS) (Bilateral)  Patient Location: PACU  Anesthesia Type:General  Level of Consciousness: awake, alert  and sedated  Airway and Oxygen Therapy: Patient Spontanous Breathing  Post-op Pain: mild  Post-op Assessment: Post-op Vital signs reviewed              Post-op Vital Signs: stable  Last Vitals:  Filed Vitals:   02/10/15 1400  BP:   Pulse:   Temp: 36.6 C  Resp:     Complications: No apparent anesthesia complications

## 2015-02-10 NOTE — Interval H&P Note (Signed)
History and Physical Interval Note:  02/10/2015 7:10 AM  Natasha Torres  has presented today for surgery, with the diagnosis of left breast cancer, ATM genetic mutator  The various methods of treatment have been discussed with the patient and family. After consideration of risks, benefits and other options for treatment, the patient has consented to  Procedure(s): BILATERAL  NIPPLE SPARING MASTECTOMY WITH LEFT  SENTINAL LYMPH NODE BIOPSY(RIGHT BREAST PROPHYLACTIC) (Bilateral) BREAST RECONSTRUCTION WITH PLACEMENT OF TISSUE EXPANDER AND FLEX HD (ACELLULAR HYDRATED DERMIS) (Bilateral) as a surgical intervention .  The patient's history has been reviewed, patient examined, no change in status, stable for surgery.  I have reviewed the patient's chart and labs.  Questions were answered to the patient's satisfaction.     Heman Que

## 2015-02-10 NOTE — H&P (Signed)
13 yof who noted a left breast mass in February on her own exam that felt fixed. This underwent an excisional biopsy in [facility] with pathology showing two cancers. 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 This is er pos at 90%, pr negative, her2 amplified and Ki is 30%. She has undergone mri here that shows breast density c. In the right breast there is lobulated mass that has now been biopsied and is a FA. In the left breast there is another biopsy of subareolar ducts that is dilated ducts and this is recommended for consideration of excision. the right side is negative as is the node biopsy. She had a port placed in [place]. She was then begun on primary chemotherapy for which she has had tch/perjeta. her port has recently not been functional and this was right subclavian. on injection by IR it appears catheter is cracked and leaking around clavicle. she still has one more cycle of chemo and is due her final mri. she is here to discuss surgery and plan for date pending mri. she has done pretty well with chemotherapy she also was noted to have atm mutation on genetic testing.   Other Problems  Anxiety Disorder Back Pain Breast Cancer Gastroesophageal Reflux Disease Lump In Breast Oophorectomy Right.  Past Surgical History  Breast Biopsy Left. multiple Breast Mass; Local Excision Left. Oral Surgery Resection of Stomach  Allergies  Penicillins  Medication History  LORazepam (0.5MG Tablet, Oral) Active. Dexamethasone (4MG Tablet, Oral) Active. Lidocaine-Prilocaine (2.5-2.5% Cream, External) Active. Prochlorperazine Maleate (10MG Tablet, Oral) Active. Lexapro (10MG Tablet, Oral) Active. Norco (5-325MG Tablet, Oral) Active. Melatonin ER (3MG Tablet ER, Oral) Active. Multiple Vitamin (Intravenous) Active. Ibuprofen (800MG Tablet, Oral) Active. Claritin (10MG Tablet, Oral) Active. Zantac 75 (75MG Tablet, Oral)  Active. Medications Reconciled Compazine (10MG Tablet, Oral) Active.  Social History Alcohol use Moderate alcohol use. Caffeine use Coffee, Tea. No drug use Tobacco use Former smoker.  Family History  Alcohol Abuse Family Members In General. Anesthetic complications Mother. Breast Cancer Family Members In General. Cancer Family Members In General. Diabetes Mellitus Family Members In General, Mother. Prostate Cancer Family Members In General, Father. Respiratory Condition Family Members In General.  Vitals  Weight: 187 lb Height: 71in Body Surface Area: 2.06 m Body Mass Index: 26.08 kg/m Temp.: 93F(Temporal)  Pulse: 76 (Regular)  BP: 124/80 (Sitting, Left Arm, Standard)  Physical Exam  General Mental Status-Alert. Orientation-Oriented X3. Chest and Lung Exam Chest and lung exam reveals -on auscultation, normal breath sounds, no adventitious sounds and normal vocal resonance. Breast Nipples-No Discharge. Breast Lump-No Palpable Breast Mass. Cardiovascular Cardiovascular examination reveals -normal heart sounds, regular rate and rhythm with no murmurs. Lymphatic Head & Neck General Head & Neck Lymphatics: Bilateral - Description - Normal. Axillary General Axillary Region: Bilateral - Description - Normal. Note: no Minto adenopathy  Assessment & Plan BREAST CANCER, LEFT (174.9  C50.912) Story: Left nsm with left axillary sn biopsy, right prophylactic nsm She is very interested in pursuing a left mastectomy given her genetic mutation and issues with radiotherapy. I think reasonable to do nsm via im incision even though she has another incision. I think time course will be long enough to have this heal. we discussed this along with attempt at sentinel node biopsy. I will discuss with rest of team but if unable to find node I think will not do alnd as long as still negative on imaging. we discussed risks of nsm including loss of sensation  of nac, loss of skin, loss of nac, epidermolysis, finding of tumor in nac requiring return to or for removal if present as well as bleeding, infection. she understands radiotherapy may still be discussed as well. due to mutation and age she is also interested in prophylactic right nsm which we will plan on performing. we are going to schedule in conjunction with plastic surgery for week of august 22.

## 2015-02-10 NOTE — Anesthesia Procedure Notes (Addendum)
Anesthesia Regional Block:  Pectoralis block  Pre-Anesthetic Checklist: ,, timeout performed, Correct Patient, Correct Site, Correct Laterality, Correct Procedure, Correct Position, site marked, Risks and benefits discussed,  Surgical consent,  Pre-op evaluation,  At surgeon's request and post-op pain management  Laterality: Right and Left  Prep: chloraprep       Needles:   Needle Type: Echogenic Needle     Needle Length: 9cm 9 cm Needle Gauge: 21 and 21 G    Additional Needles:  Procedures: ultrasound guided (picture in chart) Pectoralis block Narrative:  Start time: 02/10/2015 7:22 AM End time: 02/10/2015 7:33 AM Injection made incrementally with aspirations every 5 mL.  Performed by: Personally  Anesthesiologist: Suzette Battiest  Additional Notes: Risks and benefits discussed with pt. Bilateral pectoralis blocks performed with sedation. 30 cc's 0.25% bupivicaine with epi injected bilaterally for total of 60cc's. Pt tolerated well. No immediate complications noted.   Procedure Name: Intubation Date/Time: 02/10/2015 7:52 AM Performed by: [place] Pink Pre-anesthesia Checklist: Patient identified, Emergency Drugs available, Suction available, Patient being monitored and Timeout performed Patient Re-evaluated:Patient Re-evaluated prior to inductionOxygen Delivery Method: Circle system utilized Preoxygenation: Pre-oxygenation with 100% oxygen Intubation Type: IV induction Ventilation: Mask ventilation without difficulty Laryngoscope Size: Mac and 3 Grade View: Grade I Tube type: Oral Tube size: 7.0 mm Number of attempts: 1 Airway Equipment and Method: Stylet and LTA kit utilized Placement Confirmation: ETT inserted through vocal cords under direct vision,  positive ETCO2 and breath sounds checked- equal and bilateral Secured at: 21 cm Tube secured with: Tape Dental Injury: Teeth and Oropharynx as per pre-operative assessment

## 2015-02-10 NOTE — Interval H&P Note (Signed)
History and Physical Interval Note:  02/10/2015 6:55 AM  Natasha Torres  has presented today for surgery, with the diagnosis of left breast cancer, ATM genetic mutator  The various methods of treatment have been discussed with the patient and family. After consideration of risks, benefits and other options for treatment, the patient has consented to  Procedure(s): BILATERAL  NIPPLE SPARING MASTECTOMY WITH LEFT  SENTINAL LYMPH NODE BIOPSY(RIGHT BREAST PROPHYLACTIC) (Bilateral) BREAST RECONSTRUCTION WITH PLACEMENT OF TISSUE EXPANDER AND FLEX HD (ACELLULAR HYDRATED DERMIS) (Bilateral) as a surgical intervention .  The patient's history has been reviewed, patient examined, no change in status, stable for surgery.  I have reviewed the patient's chart and labs.  Questions were answered to the patient's satisfaction.     Aeliana Spates

## 2015-02-10 NOTE — Progress Notes (Addendum)
Pt denies ever being diagnosed with hypertension. Reports taking Atacand and Coreg due to Herceptin's effect on the heart.  Pt reports no menstruation since April due to chemo and reports not being sexually active "in months", refused serum pregnancy test, was not ordered by surgeons and was not done in PAT.

## 2015-02-10 NOTE — Op Note (Signed)
Operative Note   DATE OF OPERATION: 8.25.2016  LOCATION: [facility]- observation  SURGICAL DIVISION: Plastic Surgery  PREOPERATIVE DIAGNOSES:  1. 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 Bilateral breast reconstruction with tissue expander 2. Acellular dermis (Flex HD) for breast reconstruction 75 cm2  SURGEON: Irene Limbo MD MBA  ASSISTANT: none  ANESTHESIA:  General.   EBL: 1 L for entire case  COMPLICATIONS: None immediate.   INDICATIONS FOR PROCEDURE:  The patient, Natasha Torres, is a 34 y.o. female born on [birth year], is here for bilateral immediate reconstruction following nipple sparing mastectomies. Patient has had prior excisional biopsy left breast with scar over upper outer quadrant.   FINDINGS: Mentor Artoura High Profile 300 ml expander bilateral. Initial fill volume 180 ml bilateral. Ref [id number] Right SN [id number] Left SN [id number] Right mastectomy 418 g Left mastectomy 312 g  DESCRIPTION OF PROCEDURE:  The patient was marked in the preoperative area including breast meridians, sternal notch, chest midline, anterior axillary lines and inframammary folds. The patient was taken to the operating room. SCDs were placed and IV antibiotics were given. The patient's operative site was prepped and draped in a sterile fashion. A time out was performed and all information was confirmed to be correct. Following completion of mastectomies, reconstruction began on right side. The cavity was irrigated with saline and hemostasis obtained. The inferior insertions of pectoralis major muscle were divided and submuscular dissection completed. The pectoralis major muscle was elevated contiguous with abdominal wall fascia to inframammary fold. Laterally, serratus muscle and fascia was elevated contiguous pectoralis major muscle. Flex HD was perforated and sewn to inferior border of pectoralis major with running 3-0  vicryl. A 15 Fr drain was placed both in subcutaneous position and submuscular position and secured to skin with 2-0 nylon. The cavity was irrigated with solution containing polymyxin and bacitracin. The tissue expander was prepared and placed in submuscular position. The expander was secured to chest wall with a 3-0 vicryl. The inferior border of the acellular dermis was inset to the elevated serratus muscle and fascia. The incision was closed with 3-0 vicryl in fascial layer and 4-0 vicryl in dermis. Skin closure completed with 4-0 monocryl subcuticular and tissue adhesive. I then directed my attention to left breast. The inferior insertion pectoralis muscle divided and submuscular dissection completed. The pectoralis major muscle was elevated contiguous with abdominal wall fascia to inframammary fold. Serratus muscle was elevated contiguous pectoralis major muscle. Flex HD was perforated and sewn to inferior border of pectoralis major with running 3-0 vicryl. A 15 Fr drain was placed both in subcutaneous position and submuscular position and secured to skin with 2-0 nylon. The cavity was irrigated with solution containing polymyxin and bacitracin. The tissue expander was prepared and placed in submuscular position. The expander was secured to chest wall with a 3-0 vicryl. The inferior border of the acellular dermis was inset to the elevated serratus muscle and fascia. Closure completed in similar manner. The ports were accessed and filled to 180 ml bilaterally. The patient was brought to upright position and the skin flaps were redraped so that NAC was symmetric from the sternal notch and midline. Transparent, adherent dressings applied.   The patient was allowed to wake from anesthesia, extubated and taken to the recovery room in satisfactory condition.   SPECIMENS: none  DRAINS: 15 Fr drain in subcutaneous and submuscular position bilateral (total four)  Irene Limbo, MD [facility] Plastic &  Reconstructive Surgery [phone]

## 2015-02-10 NOTE — Transfer of Care (Signed)
Immediate Anesthesia Transfer of Care Note  Patient: Natasha Torres  Procedure(s) Performed: Procedure(s): BILATERAL  NIPPLE SPARING MASTECTOMY WITH LEFT  SENTINAL LYMPH NODE BIOPSY(RIGHT BREAST PROPHYLACTIC) (Bilateral) BREAST RECONSTRUCTION WITH PLACEMENT OF TISSUE EXPANDER AND FLEX HD (ACELLULAR HYDRATED DERMIS) (Bilateral)  Patient Location: PACU  Anesthesia Type:General, regional  Level of Consciousness: awake, alert  and oriented  Airway & Oxygen Therapy: Patient Spontanous Breathing and Patient connected to nasal cannula oxygen  Post-op Assessment: Report given to RN and Post -op Vital signs reviewed and stable  Post vital signs: Reviewed and stable  Last Vitals:  Filed Vitals:   02/10/15 1220  BP:   Pulse:   Temp: 36.6 C  Resp:     Complications: No apparent anesthesia complications

## 2015-02-10 NOTE — Op Note (Signed)
Preoperative diagnosis: left breast cancer s/p primary chemotherapy, ATM mutation Postoperative diagnosis: same as above Procedure: 1. Right prophylactic nipple sparing mastectomy 2. Left nipple sparing mastectomy 3. Left axillary sentinel node biopsy 4. Injection of blue dye for sentinel node identification Surgeon: Dr Serita Grammes Asst: Dr Irene Limbo Anesthesia: general with pec block EBL: 50 cc Drains per plastic surgery Complications none Specimens:  1. Right nsm marked short superior, long lateral, double na margin 2. Right retroareolar tissue 3. Right nipple margin 4. Left nsm marked same way 5. Left RA tissue 6. Left nipple margin 7. Left axillary sentinel nodes times 3, counts of [id number] Disposition case turned over to plastic surgery  Indications: This is a 11 yof who underwent excisional biopsy at outside facility that had multifocal her2 positive IDC. She also has undergone genetic testing and has an ATM mutation.  She underwent primary chemotherapy with no radiologic disease visible at completion. Due to ATM mutation and her young age we have decided to proceed with right prophylactic nsm and a left nsm with left axillary sn biopsy. This will be followed by immediate reconstruction.  Procedure: After informed consent was obtained she first underwent injection of technetium in the standard periareolar fashion.  She also underwent a pectoral block.  She was given antibiotics. She had SCDs in place. She was placed under general anesthesia without complication. She was then prepped and draped in the standard sterile surgical fashion.  A surgical timeout was then performed.  I first performed the right nsm. I measured an 11 cm incision out 8 cm from xyphoid.  This was made in the inframammary fold. I then used cautery to do the posterior plane removing the breast and the fascia from the pectoralis muscle to the parasternal region, clavicle and the latissimus. I then  created the anterior flap with the plasmablade.  The breast tissue was then removed. I thinned the flap and sent two additional specimens including retroareolar tissue and the nipple margin. Hemostasis was obtained. The flaps were viable and all breast tissue had been removed.  We then packed this and moved to the other side.  I injected a mixture of blue dye and saline in the retroareolar position.  This was massaged.  I marked her prior incision in the luoq.  There was activity in the axilla from the neoprobe. I made a similar incision in the IM fold.  I created flaps as above.  There was a lot of scarring in the region of her prior surgery. The breast tissue was removed in its entirety without injury to the skin. I also removed additional retroareolar tissue as well as the nipple margin.  Hemostasis was obtained. I then used the neoprobe to identify two sentinel nodes with counts above.  The first was also blue.  There was another small node that I removed next to it as the third specimen but had no activity.  This may just end up being axillary tissue but was difficult to tell.  I then turned the case over to Dr Iran Planas for reconstruction

## 2015-02-11 ENCOUNTER — Encounter (HOSPITAL_COMMUNITY): Payer: Self-pay | Admitting: General Surgery

## 2015-02-11 DIAGNOSIS — D0512 Intraductal carcinoma in situ of left breast: Secondary | ICD-10-CM | POA: Diagnosis not present

## 2015-02-11 LAB — BASIC METABOLIC PANEL
Anion gap: 4 — ABNORMAL LOW (ref 5–15)
BUN: 9 mg/dL (ref 6–20)
CALCIUM: 8.8 mg/dL — AB (ref 8.9–10.3)
CHLORIDE: 106 mmol/L (ref 101–111)
CO2: 29 mmol/L (ref 22–32)
CREATININE: 0.71 mg/dL (ref 0.44–1.00)
GFR calc non Af Amer: >60 mL/min (ref >60)
GLUCOSE: 122 mg/dL — AB (ref 65–99)
Potassium: 4.3 mmol/L (ref 3.5–5.1)
Sodium: 139 mmol/L (ref 135–145)

## 2015-02-11 LAB — CBC
HEMATOCRIT: 24 % — AB (ref 36.0–46.0)
HEMOGLOBIN: 7.7 g/dL — AB (ref 12.0–15.0)
MCH: 31.4 pg (ref 26.0–34.0)
MCHC: 32.1 g/dL (ref 30.0–36.0)
MCV: 98 fL (ref 78.0–100.0)
Platelets: 144 10*3/uL — ABNORMAL LOW (ref 150–400)
RBC: 2.45 MIL/uL — ABNORMAL LOW (ref 3.87–5.11)
RDW: 14.1 % (ref 11.5–15.5)
WBC: 7.4 10*3/uL (ref 4.0–10.5)

## 2015-02-11 LAB — POCT I-STAT 4, (NA,K, GLUC, HGB,HCT)
Glucose, Bld: 115 mg/dL — ABNORMAL HIGH (ref 65–99)
HCT: 26 % — ABNORMAL LOW (ref 36.0–46.0)
HEMOGLOBIN: 8.8 g/dL — AB (ref 12.0–15.0)
POTASSIUM: 4 mmol/L (ref 3.5–5.1)
Sodium: 142 mmol/L (ref 135–145)

## 2015-02-11 MED ORDER — SULFAMETHOXAZOLE-TRIMETHOPRIM 800-160 MG PO TABS: 1.0000 | ORAL_TABLET | Freq: Two times a day (BID) | ORAL | Status: DC

## 2015-02-11 MED ORDER — OXYCODONE HCL 5 MG PO TABS: 5.0000 mg | ORAL_TABLET | ORAL | Status: DC | PRN

## 2015-02-11 NOTE — Discharge Summary (Signed)
Physician Discharge Summary  Patient ID: Natasha Torres MRN: [id number] DOB/AGE: [birth year] 34 y.o.  Admit date: 02/10/2015 Discharge date: 02/11/2015  Admission Diagnoses: left breast cancer  Discharge Diagnoses:  Active Problems:   Breast cancer, left breast   S/P bilateral mastectomy   Discharged Condition: stable  Hospital Course: Post operatively patient transitioned to oral pain medication. Instructed on drain care. Maintained on IV antibiotics post operatively.    Treatments: surgery: bilateral nipple sparing mastectomy, left sentinel node, bilateral reconstruction with tissue expander and acellular dermis  Discharge Exam: Blood pressure 92/38, pulse 68, temperature 98.6 F (37 C), temperature source Oral, resp. rate 16, height 5\' 11"  (1.803 m), weight 90.9 kg (200 lb 6.4 oz), SpO2 99 %. Chest wall: no tenderness, flat, no hematoma, expected edema, drains serosanguinous, some ecchymoses, Tegaderms in place  Disposition: 01-Home or Self Care  Discharge Instructions    Call MD for:  redness, tenderness, or signs of infection (pain, swelling, bleeding, redness, odor or green/yellow discharge around incision site)    Complete by:  As directed      Call MD for:  severe or increased pain, loss or decreased feeling  in affected limb(s)    Complete by:  As directed      Discharge instructions    Complete by:  As directed   Ok to shower am 02/12/15. Soap and water ok. Pat tegaderms dry. Breast binder or compression garment all other times.  Strip and record drains twice daily. Bring log to clinic visit.  Ok to lift arms above head for bathing, dressing.  Start ibuprofen with meals once eating well at home  No strenous activity or exercise or housework     Driving Restrictions    Complete by:  As directed   No driving while taking narcotics     Lifting restrictions    Complete by:  As directed   No lifting greater than 5 lb     Resume previous diet    Complete by:  As  directed             Medication List    TAKE these medications        BIOTIN PO  Take 2 tablets by mouth daily.     candesartan 4 MG tablet  Commonly known as:  ATACAND  Take 1 tablet (4 mg total) by mouth every evening.     carvedilol 6.25 MG tablet  Commonly known as:  COREG  Take 1 tablet (6.25 mg total) by mouth 2 (two) times daily with a meal.     docusate sodium 100 MG capsule  Commonly known as:  COLACE  Take 100 mg by mouth daily as needed for mild constipation.     ibuprofen 200 MG tablet  Commonly known as:  ADVIL,MOTRIN  Take 200-400 mg by mouth every 6 (six) hours as needed for headache, mild pain or moderate pain.     lidocaine-prilocaine cream  Commonly known as:  EMLA  Apply to affected area once     loperamide 2 MG capsule  Commonly known as:  IMODIUM  Take 2 mg by mouth as needed for diarrhea or loose stools.     LORazepam 0.5 MG tablet  Commonly known as:  ATIVAN  Take 1 tablet (0.5 mg total) by mouth every 8 (eight) hours as needed.     multivitamin tablet  Take 2 tablets by mouth daily.     oxyCODONE 5 MG immediate release tablet  Commonly known as:  Oxy IR/ROXICODONE  Take 1-2 tablets (5-10 mg total) by mouth every 4 (four) hours as needed for moderate pain.     PROBIOTIC DAILY PO  Take 1 capsule by mouth daily. Ultimate Flora     prochlorperazine 10 MG tablet  Commonly known as:  COMPAZINE  Take 1 tablet (10 mg total) by mouth every 6 (six) hours as needed (Nausea or vomiting).     sulfamethoxazole-trimethoprim 800-160 MG per tablet  Commonly known as:  BACTRIM DS,SEPTRA DS  Take 1 tablet by mouth 2 (two) times daily.     UNABLE TO FIND  1 each by Other route daily. 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           Follow-up Information    Follow up  with Irene Limbo, MD On 02/17/2015.   Specialty:  Plastic Surgery   Why:  as scheduled   Contact information:   [place] [address] [phone]       Follow up with Rolm Bookbinder, MD. Schedule an appointment as soon as possible for a visit in 2 weeks.   Specialty:  General Surgery   Contact information:   [address] [place] [address] [phone]       Signed: Irene Limbo 02/11/2015, 8:41 AM

## 2015-02-11 NOTE — Progress Notes (Signed)
DC home with family, verbally understood DC instructions, instructed on JP care, supplies sent home with pt.. No questions asked.

## 2015-02-11 NOTE — Progress Notes (Signed)
1 Day Post-Op  Subjective: Doing well, still taking iv pain meds, tol diet  Objective: Vital signs in last 24 hours: Temp:  [97.5 F (36.4 C)-98.6 F (37 C)] 98.6 F (37 C) (08/26 0713) Pulse Rate:  [63-80] 68 (08/26 0713) Resp:  [5-20] 16 (08/26 0713) BP: (92-112)/(31-52) 92/38 mmHg (08/26 0713) SpO2:  [97 %-100 %] 99 % (08/26 0713) Weight:  [90.9 kg (200 lb 6.4 oz)] 90.9 kg (200 lb 6.4 oz) (08/25 1400) Last BM Date: 02/09/15  Intake/Output from previous day: 08/25 0701 - 08/26 0700 In: 4999 [P.O.:120; I.V.:4114; IV Piggyback:500] Out: 6546 [Urine:2150; Drains:425; Blood:1000] Intake/Output this shift: Total I/O In: 118 [P.O.:118] Out: -   Incision/Wound:bilateral breast incisions clean, intact, some ecchymosis both sides but viable Drains mostly serous now   Lab Results:   Recent Labs  02/11/15 0321  WBC 7.4  HGB 7.7*  HCT 24.0*  PLT 144*   BMET  Recent Labs  02/11/15 0321  NA 139  K 4.3  CL 106  CO2 29  GLUCOSE 122*  BUN 9  CREATININE 0.71  CALCIUM 8.8*   Anti-infectives: Anti-infectives    Start     Dose/Rate Route Frequency Ordered Stop   02/10/15 1230  ciprofloxacin (CIPRO) IVPB 400 mg     400 mg 200 mL/hr over 60 Minutes Intravenous Every 12 hours 02/10/15 1215     02/10/15 0800  bacitracin 50,000 Units, gentamicin (GARAMYCIN) 80 mg, ceFAZolin (ANCEF) 1 g in sodium chloride 0.9 % 1,000 mL      Irrigation Once 02/10/15 0753 02/10/15 1108   02/10/15 0700  ciprofloxacin (CIPRO) IVPB 400 mg     400 mg 200 mL/hr over 60 Minutes Intravenous To [facility] 02/09/15 1052 02/10/15 0800      Assessment/Plan: Pod 1 bilateral nsm, left ax sn biopsy  Low bp normal for her, not symptomatic. Abl anemia- I think she lost about 500 cc blood (not 1 Liter as in anesthesia record) but no more bleeding, she has no hematoma and drains are thin. I think this reflects intraop loss mostly on left side of prior surgery. If doing well later when Dr  Iran Planas sees her I think she can go home Will call with path next week  [facility] 02/11/2015

## 2015-02-22 ENCOUNTER — Telehealth: Payer: Self-pay | Admitting: Hematology and Oncology

## 2015-02-22 ENCOUNTER — Encounter: Payer: Self-pay | Admitting: General Practice

## 2015-02-22 ENCOUNTER — Ambulatory Visit (HOSPITAL_BASED_OUTPATIENT_CLINIC_OR_DEPARTMENT_OTHER): Payer: Managed Care, Other (non HMO) | Admitting: Hematology and Oncology

## 2015-02-22 ENCOUNTER — Encounter: Payer: Self-pay | Admitting: Hematology and Oncology

## 2015-02-22 ENCOUNTER — Ambulatory Visit: Payer: Self-pay

## 2015-02-22 ENCOUNTER — Other Ambulatory Visit (HOSPITAL_BASED_OUTPATIENT_CLINIC_OR_DEPARTMENT_OTHER): Payer: Managed Care, Other (non HMO)

## 2015-02-22 ENCOUNTER — Ambulatory Visit (HOSPITAL_BASED_OUTPATIENT_CLINIC_OR_DEPARTMENT_OTHER): Payer: Managed Care, Other (non HMO)

## 2015-02-22 VITALS — BP 122/77 | HR 82 | Temp 98.1°F | Resp 18 | Ht 71.0 in | Wt 187.7 lb

## 2015-02-22 DIAGNOSIS — C50412 Malignant neoplasm of upper-outer quadrant of left female breast: Secondary | ICD-10-CM

## 2015-02-22 DIAGNOSIS — Z5111 Encounter for antineoplastic chemotherapy: Secondary | ICD-10-CM | POA: Diagnosis not present

## 2015-02-22 DIAGNOSIS — C50919 Malignant neoplasm of unspecified site of unspecified female breast: Secondary | ICD-10-CM

## 2015-02-22 LAB — COMPREHENSIVE METABOLIC PANEL (CC13)
ALBUMIN: 3.4 g/dL — AB (ref 3.5–5.0)
ALK PHOS: 50 U/L (ref 40–150)
ALT: 14 U/L (ref 0–55)
ANION GAP: 6 meq/L (ref 3–11)
AST: 20 U/L (ref 5–34)
BUN: 17.2 mg/dL (ref 7.0–26.0)
CALCIUM: 9.4 mg/dL (ref 8.4–10.4)
CO2: 27 mEq/L (ref 22–29)
CREATININE: 0.7 mg/dL (ref 0.6–1.1)
Chloride: 109 mEq/L (ref 98–109)
EGFR: >90 mL/min/{1.73_m2} (ref >90)
Glucose: 91 mg/dl (ref 70–140)
POTASSIUM: 4.5 meq/L (ref 3.5–5.1)
Sodium: 143 mEq/L (ref 136–145)
Total Bilirubin: 0.2 mg/dL (ref 0.20–1.20)
Total Protein: 6 g/dL — ABNORMAL LOW (ref 6.4–8.3)

## 2015-02-22 LAB — CBC WITH DIFFERENTIAL/PLATELET
BASO%: 0.7 % (ref 0.0–2.0)
BASOS ABS: 0 10*3/uL (ref 0.0–0.1)
EOS%: 6.5 % (ref 0.0–7.0)
Eosinophils Absolute: 0.3 10*3/uL (ref 0.0–0.5)
HEMATOCRIT: 27.2 % — AB (ref 34.8–46.6)
HEMOGLOBIN: 8.7 g/dL — AB (ref 11.6–15.9)
LYMPH#: 1.2 10*3/uL (ref 0.9–3.3)
LYMPH%: 29.2 % (ref 14.0–49.7)
MCH: 30.7 pg (ref 25.1–34.0)
MCHC: 32 g/dL (ref 31.5–36.0)
MCV: 96.1 fL (ref 79.5–101.0)
MONO#: 0.3 10*3/uL (ref 0.1–0.9)
MONO%: 7.7 % (ref 0.0–14.0)
NEUT#: 2.2 10*3/uL (ref 1.5–6.5)
NEUT%: 55.9 % (ref 38.4–76.8)
PLATELETS: 193 10*3/uL (ref 145–400)
RBC: 2.83 10*6/uL — ABNORMAL LOW (ref 3.70–5.45)
RDW: 13.2 % (ref 11.2–14.5)
WBC: 4 10*3/uL (ref 3.9–10.3)

## 2015-02-22 MED ORDER — TAMOXIFEN CITRATE 20 MG PO TABS: 20.0000 mg | ORAL_TABLET | Freq: Every day | ORAL | Status: DC

## 2015-02-22 MED ORDER — GOSERELIN ACETATE 3.6 MG ~~LOC~~ IMPL
3.6000 mg | DRUG_IMPLANT | Freq: Once | SUBCUTANEOUS | Status: AC
  Administered 2015-02-22: 3.6 mg via SUBCUTANEOUS
  Filled 2015-02-22: qty 3.6

## 2015-02-22 MED ORDER — VENLAFAXINE HCL ER 37.5 MG PO CP24: 37.5000 mg | ORAL_CAPSULE | Freq: Every day | ORAL | Status: DC

## 2015-02-22 NOTE — Progress Notes (Signed)
Patient Care Team: Loraine Leriche, MD as PCP - General (Internal Medicine)  DIAGNOSIS: No matching staging information was found for the patient.  SUMMARY OF ONCOLOGIC HISTORY:   Breast cancer of upper-outer quadrant of left female breast   08/27/2014 Initial Diagnosis Excisional biopsy: 2 lumps showing 1.5 cm and 0.9 cm IDC, Grade 3, ER Pos, PR Neg and HER-2 amplified Ratio 2.6, Multifocal   09/27/2014 - 01/10/2015 Neo-Adjuvant Chemotherapy Neoadjuvant TCH Perjeta every 3 week 6 followed by Herceptin maintenance   01/13/2015 Breast MRI Postsurgical changes in left breast without residual enhancing masses compatible with treatment response   02/10/2015 Surgery right mastectomy: No malignancy, 0/3 sentinel nodes, complete response to chemotherapy    CHIEF COMPLIANT: follow-up after surgery  INTERVAL HISTORY: Natasha Torres is a 34 year old with above-mentioned history of right breast cancer treated with neo-adjuvant chemotherapy and had a complete pathologic response based upon surgery done on 02/10/2015. She is here today to discuss the treatment options. We have not been able to initiate Herceptin maintenance because of decline in ejection fraction. She has an appointment with a cardiologist to recheck her echocardiogram in 2 months and then decide if she can go back on Herceptin maintenance. Today she is here to talk about antiestrogen therapy options. She is set to receive an injection of Zoladex for ovarian suppression. She is also undergoing breast reconstruction.  REVIEW OF SYSTEMS:   Constitutional: Denies fevers, chills or abnormal weight loss Eyes: Denies blurriness of vision Ears, nose, mouth, throat, and face: Denies mucositis or sore throat Respiratory: Denies cough, dyspnea or wheezes Cardiovascular: Denies palpitation, chest discomfort or lower extremity swelling Gastrointestinal:  Denies nausea, heartburn or change in bowel habits Skin: Denies abnormal skin  rashes Lymphatics: Denies new lymphadenopathy or easy bruising Neurological:Denies numbness, tingling or new weaknesses Behavioral/Psych: Mood is stable, no new changes  All other systems were reviewed with the patient and are negative.  I have reviewed the past medical history, past surgical history, social history and family history with the patient and they are unchanged from previous note.  ALLERGIES:  is allergic to buprenorphine hcl; morphine and related; and penicillins.  MEDICATIONS:  Current Outpatient Prescriptions  Medication Sig Dispense Refill  . BIOTIN PO Take 2 tablets by mouth daily.    . candesartan (ATACAND) 4 MG tablet Take 1 tablet (4 mg total) by mouth every evening. 30 tablet 3  . carvedilol (COREG) 6.25 MG tablet Take 1 tablet (6.25 mg total) by mouth 2 (two) times daily with a meal. 60 tablet 3  . docusate sodium (COLACE) 100 MG capsule Take 100 mg by mouth daily as needed for mild constipation.    [name] escitalopram (LEXAPRO) 10 MG tablet Take 10 mg by mouth.    [name] HYDROcodone-acetaminophen (NORCO/VICODIN) 5-325 MG per tablet Take 1-2 tablets by mouth.    [name] ibuprofen (ADVIL,MOTRIN) 200 MG tablet Take 200-400 mg by mouth every 6 (six) hours as needed for headache, mild pain or moderate pain.    [name] lidocaine-prilocaine (EMLA) cream Apply to affected area once (Patient taking differently: Apply 1 application topically as needed (pain). Apply to affected area once) 30 g 3  . loperamide (IMODIUM) 2 MG capsule Take 2 mg by mouth as needed for diarrhea or loose stools.    [name] LORazepam (ATIVAN) 0.5 MG tablet Take 1 tablet (0.5 mg total) by mouth every 8 (eight) hours as needed. (Patient taking differently: Take 0.5 mg by mouth every 8 (eight) hours as needed for anxiety. )  60 tablet 0  . Melatonin 10 MG CAPS Take 10 mg by mouth.    . Multiple Vitamin (MULTIVITAMIN) tablet Take 2 tablets by mouth daily.     [name] oxyCODONE (OXY IR/ROXICODONE) 5 MG immediate release tablet Take 1-2  tablets (5-10 mg total) by mouth every 4 (four) hours as needed for moderate pain. 50 tablet 0  . Probiotic Product (PROBIOTIC DAILY PO) Take 1 capsule by mouth daily. Ultimate Flora    . prochlorperazine (COMPAZINE) 10 MG tablet Take 1 tablet (10 mg total) by mouth every 6 (six) hours as needed (Nausea or vomiting). 30 tablet 1  . sulfamethoxazole-trimethoprim (BACTRIM DS,SEPTRA DS) 800-160 MG per tablet Take 1 tablet by mouth 2 (two) times daily. 12 tablet 0  . UNABLE TO FIND 1 each by Other route daily. 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 zolpidem (AMBIEN) 5 MG tablet Take 5 mg by mouth at bedtime as needed for sleep.    . tamoxifen (NOLVADEX) 20 MG tablet Take 1 tablet (20 mg total) by mouth daily. 90 tablet 3  . venlafaxine XR (EFFEXOR-XR) 37.5 MG 24 hr capsule Take 1 capsule (37.5 mg total) by mouth daily with breakfast. 30 capsule 6   No current facility-administered medications for this visit.    PHYSICAL EXAMINATION: ECOG PERFORMANCE STATUS: 1 - Symptomatic but completely ambulatory  Filed Vitals:   02/22/15 0918  BP: 122/77  Pulse: 82  Temp: 98.1 F (36.7 C)  Resp: 18   Filed Weights   02/22/15 0918  Weight: 187 lb 11.2 oz (85.14 kg)    GENERAL:alert, no distress and comfortable SKIN: skin color, texture, turgor are normal, no rashes or significant lesions EYES: normal, Conjunctiva are pink and non-injected, sclera clear OROPHARYNX:no exudate, no erythema and lips, buccal mucosa, and tongue normal  NECK: supple, thyroid normal size, non-tender, without nodularity LYMPH:  no palpable lymphadenopathy in the cervical, axillary or inguinal LUNGS: clear to auscultation and percussion with normal breathing effort HEART: regular rate & rhythm and no murmurs and no lower extremity edema ABDOMEN:abdomen soft, non-tender and normal bowel sounds Musculoskeletal:no cyanosis of digits and no  clubbing  NEURO: alert & oriented x 3 with fluent speech, no focal motor/sensory deficits  LABORATORY DATA:  I have reviewed the data as listed   Chemistry      Component Value Date/Time   NA 143 02/22/2015 0905   NA 139 02/11/2015 0321   K 4.5 02/22/2015 0905   K 4.3 02/11/2015 0321   CL 106 02/11/2015 0321   CO2 27 02/22/2015 0905   CO2 29 02/11/2015 0321   BUN 17.2 02/22/2015 0905   BUN 9 02/11/2015 0321   CREATININE 0.7 02/22/2015 0905   CREATININE 0.71 02/11/2015 0321      Component Value Date/Time   CALCIUM 9.4 02/22/2015 0905   CALCIUM 8.8* 02/11/2015 0321   ALKPHOS 50 02/22/2015 0905   ALKPHOS 121* 11/29/2011 1615   AST 20 02/22/2015 0905   AST 27 11/29/2011 1615   ALT 14 02/22/2015 0905   ALT 24 11/29/2011 1615   BILITOT 0.20 02/22/2015 0905   BILITOT 0.1* 11/29/2011 1615       Lab Results  Component Value Date   WBC 4.0 02/22/2015   HGB 8.7* 02/22/2015   HCT 27.2* 02/22/2015   MCV 96.1 02/22/2015   PLT 193 02/22/2015   NEUTROABS 2.2 02/22/2015    ASSESSMENT & PLAN:  Breast  cancer of upper-outer quadrant of left female breast 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 Biopsy of the right breast mass and left axillary lymph node and the secondary mass left breast back as benign. Status post neo-adjuvant TCH Perjeta 6 cycles from 09/27/2014 to 01/10/2015 Followed by bilateral mastectomies on 02/10/2015: No malignancy in either breast  Pathology review: I discussed the final pathology report and provided her with a copy of the report. There is no evidence of residual disease. Lymph nodes were also negative.  Plan: 1. Continue maintenance  Herceptin to complete 1 year of therapy. However Herceptin is on hold because of decline in ejection fraction. Echocardiogram on 02/03/2015 showed an ejection fraction of 45%. This has dropped from 55% previously. Dr. Kirk Ruths is monitoring her. We cannot give her Herceptin until her heart function improves. Currently takes Coreg and ARBs. 2. Continue with her breast reconstruction process in the meantime. 3. We will start anti-estrogen therapy with tamoxifen with Zoladex for ovarian suppression.  Tamoxifen toxicities:We discussed the risks and benefits of tamoxifen. These include but not limited to insomnia, hot flashes, mood changes, vaginal dryness, and weight gain. Although rare, serious side effects including endometrial cancer, risk of blood clots were also discussed. We strongly believe that the benefits far outweigh the risks. Patient understands these risks and consented to starting treatment. Planned treatment duration is 5-10 years.  No orders of the defined types were placed in this encounter.   The patient has a good understanding of the overall plan. she agrees with it. she will call with any problems that may develop before the next visit here.   Rulon Eisenmenger, MD

## 2015-02-22 NOTE — Progress Notes (Signed)
Spiritual Care Note  Met with Natasha Torres today while she was here for office visit, providing opportunity for her to share and process updates:  Completion of chemo, double mastectomy, personal concerns.  She presented as clear, strong, and grateful; personal stressors also elicited tears.  She values prayer, spiritual care, counseling; due to financial constraints, she has not worked with her regular counselor recently.  With her enthusiastic permission, I will refer her to Counseling Intern Vaughan Sine for support regarding major life transitions.  Plan of care is to continue spiritual/emotional and prayer support from chaplain, adding counseling as a targeted resource for personal stressors; per pt permission, Vaughan Sine will phone pt directly to arrange appointment.  [place], [place] Pager [phone] Voicemail  [phone]

## 2015-02-22 NOTE — Progress Notes (Signed)
Spiritual Care Note  Sent handwritten card and Cordie Grice counseling brochure for further support and information.  Following, but please also page as needs arise.  Thank you.  [place], [place] Pager [phone] Voicemail  [phone]

## 2015-02-22 NOTE — Telephone Encounter (Signed)
Appointments made and avs printed for patient °

## 2015-02-22 NOTE — Assessment & Plan Note (Signed)
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 Biopsy of the right breast mass and left axillary lymph node and the secondary mass left breast back as benign. Status post neo-adjuvant TCH Perjeta 6 cycles from 09/27/2014 to 01/10/2015 Followed by bilateral mastectomies on 02/10/2015: No malignancy in either breast  Pathology review: I discussed the final pathology report and provided her with a copy of the report. There is no evidence of residual disease. Lymph nodes were also negative.  Plan: 1. Continue maintenance Herceptin to complete 1 year of therapy. However Herceptin is on hold because of decline in ejection fraction. Echocardiogram on 02/03/2015 showed an ejection fraction of 45%. This has dropped from 55% previously. Dr. Kirk Ruths is monitoring her. We cannot give her Herceptin until her heart function improves. Currently takes Coreg and ARBs. 2. Continue with her breast reconstruction process in the meantime. 3. We will start anti-estrogen therapy with tamoxifen.  We discussed the risks and benefits of tamoxifen. These include but not limited to insomnia, hot flashes, mood changes, vaginal dryness, and weight gain. Although rare, serious side effects including endometrial cancer, risk of blood clots were also discussed. We strongly believe that the benefits far outweigh the risks. Patient understands these risks and consented to starting treatment. Planned treatment duration is 5-10 years.

## 2015-02-27 ENCOUNTER — Observation Stay (HOSPITAL_COMMUNITY): Payer: Managed Care, Other (non HMO) | Admitting: Anesthesiology

## 2015-02-27 ENCOUNTER — Encounter (HOSPITAL_COMMUNITY): Payer: Self-pay

## 2015-02-27 ENCOUNTER — Observation Stay (HOSPITAL_COMMUNITY): Payer: Managed Care, Other (non HMO)

## 2015-02-27 ENCOUNTER — Observation Stay (HOSPITAL_COMMUNITY)
Admission: EM | Admit: 2015-02-27 | Discharge: 2015-02-28 | Disposition: A | Discharge status: Home / Self Care | Payer: Managed Care, Other (non HMO) | Attending: Plastic Surgery | Admitting: Plastic Surgery

## 2015-02-27 ENCOUNTER — Emergency Department (HOSPITAL_COMMUNITY): Payer: Managed Care, Other (non HMO)

## 2015-02-27 ENCOUNTER — Encounter (HOSPITAL_COMMUNITY)
Admission: EM | Disposition: A | Discharge status: Home / Self Care | Payer: Self-pay | Source: Home / Self Care | Attending: Emergency Medicine

## 2015-02-27 DIAGNOSIS — Z9013 Acquired absence of bilateral breasts and nipples: Secondary | ICD-10-CM | POA: Insufficient documentation

## 2015-02-27 DIAGNOSIS — C50912 Malignant neoplasm of unspecified site of left female breast: Secondary | ICD-10-CM | POA: Insufficient documentation

## 2015-02-27 DIAGNOSIS — R110 Nausea: Secondary | ICD-10-CM | POA: Diagnosis present

## 2015-02-27 DIAGNOSIS — N61 Inflammatory disorders of breast: Principal | ICD-10-CM | POA: Insufficient documentation

## 2015-02-27 DIAGNOSIS — R112 Nausea with vomiting, unspecified: Secondary | ICD-10-CM

## 2015-02-27 DIAGNOSIS — N651 Disproportion of reconstructed breast: Secondary | ICD-10-CM | POA: Insufficient documentation

## 2015-02-27 DIAGNOSIS — Z9010 Acquired absence of unspecified breast and nipple: Secondary | ICD-10-CM

## 2015-02-27 DIAGNOSIS — R079 Chest pain, unspecified: Secondary | ICD-10-CM

## 2015-02-27 DIAGNOSIS — I959 Hypotension, unspecified: Secondary | ICD-10-CM

## 2015-02-27 HISTORY — PX: REMOVAL OF TISSUE EXPANDER AND PLACEMENT OF IMPLANT: SHX6457

## 2015-02-27 LAB — URINE MICROSCOPIC-ADD ON

## 2015-02-27 LAB — HEPATIC FUNCTION PANEL
ALK PHOS: 41 U/L (ref 38–126)
ALT: 13 U/L — ABNORMAL LOW (ref 14–54)
AST: 24 U/L (ref 15–41)
Albumin: 3.2 g/dL — ABNORMAL LOW (ref 3.5–5.0)
BILIRUBIN TOTAL: 0.5 mg/dL (ref 0.3–1.2)
Bilirubin, Direct: <0.1 mg/dL — ABNORMAL LOW (ref 0.1–0.5)
Total Protein: 6.2 g/dL — ABNORMAL LOW (ref 6.5–8.1)

## 2015-02-27 LAB — CBC WITH DIFFERENTIAL/PLATELET
BASOS PCT: 0 % (ref 0–1)
Basophils Absolute: 0 10*3/uL (ref 0.0–0.1)
EOS ABS: 0 10*3/uL (ref 0.0–0.7)
Eosinophils Relative: 0 % (ref 0–5)
HEMATOCRIT: 28.2 % — AB (ref 36.0–46.0)
Hemoglobin: 9.2 g/dL — ABNORMAL LOW (ref 12.0–15.0)
LYMPHS ABS: 0.7 10*3/uL (ref 0.7–4.0)
Lymphocytes Relative: 5 % — ABNORMAL LOW (ref 12–46)
MCH: 31.5 pg (ref 26.0–34.0)
MCHC: 32.6 g/dL (ref 30.0–36.0)
MCV: 96.6 fL (ref 78.0–100.0)
MONO ABS: 0.7 10*3/uL (ref 0.1–1.0)
MONOS PCT: 5 % (ref 3–12)
NEUTROS ABS: 12.1 10*3/uL — AB (ref 1.7–7.7)
Neutrophils Relative %: 90 % — ABNORMAL HIGH (ref 43–77)
Platelets: 194 10*3/uL (ref 150–400)
RBC: 2.92 MIL/uL — ABNORMAL LOW (ref 3.87–5.11)
RDW: 13.3 % (ref 11.5–15.5)
WBC: 13.6 10*3/uL — ABNORMAL HIGH (ref 4.0–10.5)

## 2015-02-27 LAB — URINALYSIS, ROUTINE W REFLEX MICROSCOPIC
GLUCOSE, UA: NEGATIVE mg/dL
HGB URINE DIPSTICK: NEGATIVE
KETONES UR: NEGATIVE mg/dL
Leukocytes, UA: NEGATIVE
Nitrite: NEGATIVE
PH: 6 (ref 5.0–8.0)
Protein, ur: 30 mg/dL — AB
Specific Gravity, Urine: 1.033 — ABNORMAL HIGH (ref 1.005–1.030)
Urobilinogen, UA: 0.2 mg/dL (ref 0.0–1.0)

## 2015-02-27 LAB — BASIC METABOLIC PANEL
Anion gap: 9 (ref 5–15)
BUN: 16 mg/dL (ref 6–20)
CALCIUM: 8.8 mg/dL — AB (ref 8.9–10.3)
CO2: 25 mmol/L (ref 22–32)
CREATININE: 0.81 mg/dL (ref 0.44–1.00)
Chloride: 99 mmol/L — ABNORMAL LOW (ref 101–111)
GFR calc non Af Amer: >60 mL/min (ref >60)
Glucose, Bld: 132 mg/dL — ABNORMAL HIGH (ref 65–99)
Potassium: 3.7 mmol/L (ref 3.5–5.1)
Sodium: 133 mmol/L — ABNORMAL LOW (ref 135–145)

## 2015-02-27 LAB — LIPASE, BLOOD: LIPASE: 19 U/L — AB (ref 22–51)

## 2015-02-27 LAB — PREGNANCY, URINE: Preg Test, Ur: NEGATIVE

## 2015-02-27 LAB — I-STAT CG4 LACTIC ACID, ED: Lactic Acid, Venous: 1.29 mmol/L (ref 0.5–2.0)

## 2015-02-27 SURGERY — REMOVAL, TISSUE EXPANDER, BREAST, WITH IMPLANT INSERTION
Anesthesia: General | Laterality: Right

## 2015-02-27 MED ORDER — LIDOCAINE HCL (CARDIAC) 20 MG/ML IV SOLN
INTRAVENOUS | Status: DC | PRN
  Administered 2015-02-27: 60 mg via INTRAVENOUS

## 2015-02-27 MED ORDER — LACTATED RINGERS IV SOLN
INTRAVENOUS | Status: DC | PRN
  Administered 2015-02-27 (×2): via INTRAVENOUS

## 2015-02-27 MED ORDER — SODIUM CHLORIDE 0.9 % IR SOLN
Status: DC | PRN
  Administered 2015-02-27: 500 mL

## 2015-02-27 MED ORDER — 0.9 % SODIUM CHLORIDE (POUR BTL) OPTIME
TOPICAL | Status: DC | PRN
  Administered 2015-02-27: 1000 mL

## 2015-02-27 MED ORDER — HYDROMORPHONE HCL 1 MG/ML IJ SOLN
0.2500 mg | INTRAMUSCULAR | Status: DC | PRN
  Administered 2015-02-27: 0.5 mg via INTRAVENOUS

## 2015-02-27 MED ORDER — MIDAZOLAM HCL 2 MG/2ML IJ SOLN
INTRAMUSCULAR | Status: AC
  Filled 2015-02-27: qty 4

## 2015-02-27 MED ORDER — DOUBLE ANTIBIOTIC 500-10000 UNIT/GM EX OINT
TOPICAL_OINTMENT | CUTANEOUS | Status: AC
  Filled 2015-02-27: qty 1

## 2015-02-27 MED ORDER — EPHEDRINE SULFATE 50 MG/ML IJ SOLN
INTRAMUSCULAR | Status: DC | PRN
  Administered 2015-02-27 (×3): 10 mg via INTRAVENOUS

## 2015-02-27 MED ORDER — HYDROCODONE-ACETAMINOPHEN 5-325 MG PO TABS
1.0000 | ORAL_TABLET | Freq: Four times a day (QID) | ORAL | Status: DC | PRN
  Administered 2015-02-27 – 2015-02-28 (×3): 2 via ORAL
  Filled 2015-02-27 (×3): qty 2

## 2015-02-27 MED ORDER — KETOROLAC TROMETHAMINE 30 MG/ML IJ SOLN
30.0000 mg | Freq: Three times a day (TID) | INTRAMUSCULAR | Status: AC
  Administered 2015-02-27 – 2015-02-28 (×3): 30 mg via INTRAVENOUS
  Filled 2015-02-27 (×3): qty 1

## 2015-02-27 MED ORDER — PROPOFOL 10 MG/ML IV BOLUS
INTRAVENOUS | Status: AC
  Filled 2015-02-27: qty 20

## 2015-02-27 MED ORDER — DEXAMETHASONE SODIUM PHOSPHATE 4 MG/ML IJ SOLN
INTRAMUSCULAR | Status: DC | PRN
  Administered 2015-02-27: 4 mg via INTRAVENOUS

## 2015-02-27 MED ORDER — LIDOCAINE HCL (CARDIAC) 20 MG/ML IV SOLN
INTRAVENOUS | Status: AC
  Filled 2015-02-27: qty 5

## 2015-02-27 MED ORDER — SCOPOLAMINE 1 MG/3DAYS TD PT72
MEDICATED_PATCH | TRANSDERMAL | Status: DC | PRN
  Administered 2015-02-27: 1 via TRANSDERMAL

## 2015-02-27 MED ORDER — SODIUM CHLORIDE 0.9 % IV SOLN
INTRAVENOUS | Status: DC
  Administered 2015-02-27: 15:00:00 via INTRAVENOUS

## 2015-02-27 MED ORDER — FENTANYL CITRATE (PF) 250 MCG/5ML IJ SOLN
INTRAMUSCULAR | Status: DC | PRN
  Administered 2015-02-27 (×2): 25 ug via INTRAVENOUS
  Administered 2015-02-27 (×2): 50 ug via INTRAVENOUS
  Administered 2015-02-27 (×2): 25 ug via INTRAVENOUS
  Administered 2015-02-27: 50 ug via INTRAVENOUS

## 2015-02-27 MED ORDER — KCL IN DEXTROSE-NACL 20-5-0.45 MEQ/L-%-% IV SOLN
INTRAVENOUS | Status: DC
  Administered 2015-02-27 – 2015-02-28 (×2): via INTRAVENOUS
  Filled 2015-02-27 (×3): qty 1000

## 2015-02-27 MED ORDER — IRBESARTAN 75 MG PO TABS: 37.5000 mg | ORAL_TABLET | Freq: Every day | ORAL | Status: DC

## 2015-02-27 MED ORDER — ONDANSETRON HCL 4 MG/2ML IJ SOLN
4.0000 mg | Freq: Once | INTRAMUSCULAR | Status: DC | PRN
  Filled 2015-02-27: qty 2

## 2015-02-27 MED ORDER — CIPROFLOXACIN IN D5W 400 MG/200ML IV SOLN
400.0000 mg | Freq: Two times a day (BID) | INTRAVENOUS | Status: DC
  Administered 2015-02-27 – 2015-02-28 (×2): 400 mg via INTRAVENOUS
  Filled 2015-02-27 (×3): qty 200

## 2015-02-27 MED ORDER — VENLAFAXINE HCL ER 37.5 MG PO CP24
37.5000 mg | ORAL_CAPSULE | Freq: Every day | ORAL | Status: DC
Start: 2015-02-27 — End: 2015-02-28
  Administered 2015-02-27: 37.5 mg via ORAL
  Filled 2015-02-27 (×3): qty 1

## 2015-02-27 MED ORDER — HEPARIN SOD (PORK) LOCK FLUSH 100 UNIT/ML IV SOLN
INTRAVENOUS | Status: AC
  Filled 2015-02-27: qty 5

## 2015-02-27 MED ORDER — CARVEDILOL 6.25 MG PO TABS
6.2500 mg | ORAL_TABLET | Freq: Two times a day (BID) | ORAL | Status: DC
  Administered 2015-02-27 – 2015-02-28 (×2): 6.25 mg via ORAL
  Filled 2015-02-27 (×2): qty 1

## 2015-02-27 MED ORDER — LORAZEPAM 0.5 MG PO TABS: 0.5000 mg | ORAL_TABLET | Freq: Three times a day (TID) | ORAL | Status: DC | PRN

## 2015-02-27 MED ORDER — TAMOXIFEN CITRATE 10 MG PO TABS
20.0000 mg | ORAL_TABLET | Freq: Every day | ORAL | Status: DC
  Filled 2015-02-27: qty 2

## 2015-02-27 MED ORDER — VENLAFAXINE HCL ER 37.5 MG PO CP24
37.5000 mg | ORAL_CAPSULE | Freq: Every day | ORAL | Status: DC
  Filled 2015-02-27: qty 1

## 2015-02-27 MED ORDER — FENTANYL CITRATE (PF) 100 MCG/2ML IJ SOLN
25.0000 ug | INTRAMUSCULAR | Status: DC | PRN
  Administered 2015-02-27: 25 ug via INTRAVENOUS
  Filled 2015-02-27: qty 2

## 2015-02-27 MED ORDER — ONDANSETRON HCL 4 MG/2ML IJ SOLN
INTRAMUSCULAR | Status: DC | PRN
Start: 2015-02-27 — End: 2015-02-27
  Administered 2015-02-27: 4 mg via INTRAVENOUS

## 2015-02-27 MED ORDER — MIDAZOLAM HCL 2 MG/2ML IJ SOLN
INTRAMUSCULAR | Status: DC | PRN
  Administered 2015-02-27: 2 mg via INTRAVENOUS

## 2015-02-27 MED ORDER — IOHEXOL 350 MG/ML SOLN
75.0000 mL | Freq: Once | INTRAVENOUS | Status: AC | PRN
  Administered 2015-02-27: 75 mL via INTRAVENOUS

## 2015-02-27 MED ORDER — HYDROMORPHONE HCL 1 MG/ML IJ SOLN
0.5000 mg | INTRAMUSCULAR | Status: DC | PRN
  Administered 2015-02-27: 1 mg via INTRAVENOUS
  Filled 2015-02-27: qty 1

## 2015-02-27 MED ORDER — PROPOFOL 10 MG/ML IV BOLUS
INTRAVENOUS | Status: DC | PRN
  Administered 2015-02-27: 100 mg via INTRAVENOUS

## 2015-02-27 MED ORDER — HEPARIN SOD (PORK) LOCK FLUSH 100 UNIT/ML IV SOLN
500.0000 [IU] | INTRAVENOUS | Status: DC | PRN
  Filled 2015-02-27: qty 5

## 2015-02-27 MED ORDER — ONDANSETRON 4 MG PO TBDP
4.0000 mg | ORAL_TABLET | Freq: Four times a day (QID) | ORAL | Status: DC | PRN
Start: 2015-02-27 — End: 2015-02-28

## 2015-02-27 MED ORDER — HEPARIN SOD (PORK) LOCK FLUSH 100 UNIT/ML IV SOLN
500.0000 [IU] | INTRAVENOUS | Status: DC
  Filled 2015-02-27: qty 5

## 2015-02-27 MED ORDER — SODIUM CHLORIDE 0.9 % IV SOLN
INTRAVENOUS | Status: DC
  Administered 2015-02-27: 12:00:00 via INTRAVENOUS

## 2015-02-27 MED ORDER — PHENYLEPHRINE HCL 10 MG/ML IJ SOLN
INTRAMUSCULAR | Status: DC | PRN
  Administered 2015-02-27: 120 ug via INTRAVENOUS

## 2015-02-27 MED ORDER — HYDROMORPHONE HCL 1 MG/ML IJ SOLN
INTRAMUSCULAR | Status: AC
  Filled 2015-02-27: qty 1

## 2015-02-27 MED ORDER — ONDANSETRON HCL 4 MG/2ML IJ SOLN
4.0000 mg | Freq: Four times a day (QID) | INTRAMUSCULAR | Status: DC | PRN
  Administered 2015-02-28: 4 mg via INTRAVENOUS
  Filled 2015-02-27: qty 2

## 2015-02-27 MED ORDER — ONDANSETRON HCL 4 MG/2ML IJ SOLN
4.0000 mg | INTRAMUSCULAR | Status: DC | PRN
  Administered 2015-02-27: 4 mg via INTRAVENOUS

## 2015-02-27 MED ORDER — IRBESARTAN 75 MG PO TABS
37.5000 mg | ORAL_TABLET | Freq: Every day | ORAL | Status: DC
  Administered 2015-02-27: 37.5 mg via ORAL
  Filled 2015-02-27 (×2): qty 1

## 2015-02-27 MED ORDER — FENTANYL CITRATE (PF) 250 MCG/5ML IJ SOLN
INTRAMUSCULAR | Status: AC
  Filled 2015-02-27: qty 5

## 2015-02-27 MED ORDER — SODIUM CHLORIDE 0.9 % IV BOLUS (SEPSIS)
1000.0000 mL | Freq: Once | INTRAVENOUS | Status: AC
  Administered 2015-02-27: 1000 mL via INTRAVENOUS

## 2015-02-27 SURGICAL SUPPLY — 35 items
ADH SKN CLS APL DERMABOND .7 (GAUZE/BANDAGES/DRESSINGS) ×1
BAG DECANTER FOR FLEXI CONT (MISCELLANEOUS) ×1 IMPLANT
BINDER BREAST LRG (GAUZE/BANDAGES/DRESSINGS) ×1 IMPLANT
CANISTER SUCTION 2500CC (MISCELLANEOUS) ×1 IMPLANT
COVER SURGICAL LIGHT HANDLE (MISCELLANEOUS) ×1 IMPLANT
DERMABOND ADVANCED (GAUZE/BANDAGES/DRESSINGS) ×1
DERMABOND ADVANCED .7 DNX12 (GAUZE/BANDAGES/DRESSINGS) IMPLANT
DRAIN CHANNEL 15F RND FF W/TCR (WOUND CARE) ×1 IMPLANT
DRAPE ORTHO SPLIT 77X108 STRL (DRAPES) ×4
DRAPE SURG 17X11 SM STRL (DRAPES) ×2 IMPLANT
DRAPE SURG ORHT 6 SPLT 77X108 (DRAPES) IMPLANT
DRAPE WARM FLUID 44X44 (DRAPE) ×1 IMPLANT
DRSG PAD ABDOMINAL 8X10 ST (GAUZE/BANDAGES/DRESSINGS) ×1 IMPLANT
ELECT REM PT RETURN 9FT ADLT (ELECTROSURGICAL) ×2
ELECTRODE REM PT RTRN 9FT ADLT (ELECTROSURGICAL) IMPLANT
EVACUATOR SILICONE 100CC (DRAIN) ×1 IMPLANT
EXPANDER BREAST 455CC (Expander) ×1 IMPLANT
GLOVE BIO SURGEON STRL SZ 6 (GLOVE) ×1 IMPLANT
GLOVE SURG SS PI 6.0 STRL IVOR (GLOVE) ×1 IMPLANT
GOWN STRL REUS W/ TWL LRG LVL3 (GOWN DISPOSABLE) IMPLANT
GOWN STRL REUS W/TWL LRG LVL3 (GOWN DISPOSABLE) ×2
KIT BASIN OR (CUSTOM PROCEDURE TRAY) ×1 IMPLANT
KIT ROOM TURNOVER OR (KITS) ×1 IMPLANT
LIGHT WAVEGUIDE WIDE FLAT (MISCELLANEOUS) ×1 IMPLANT
NS IRRIG 1000ML POUR BTL (IV SOLUTION) ×2 IMPLANT
PACK GENERAL/GYN (CUSTOM PROCEDURE TRAY) ×1 IMPLANT
PAD ARMBOARD 7.5X6 YLW CONV (MISCELLANEOUS) ×2 IMPLANT
PIN SAFETY STERILE (MISCELLANEOUS) ×1 IMPLANT
SET ASEPTIC TRANSFER (MISCELLANEOUS) ×1 IMPLANT
STAPLER VISISTAT 35W (STAPLE) ×1 IMPLANT
SUT MNCRL AB 4-0 PS2 18 (SUTURE) ×1 IMPLANT
SWAB COLLECTION DEVICE MRSA (MISCELLANEOUS) ×1 IMPLANT
TOWEL OR 17X24 6PK STRL BLUE (TOWEL DISPOSABLE) ×1 IMPLANT
TOWEL OR 17X26 10 PK STRL BLUE (TOWEL DISPOSABLE) ×1 IMPLANT
TUBE ANAEROBIC SPECIMEN COL (MISCELLANEOUS) ×1 IMPLANT

## 2015-02-27 NOTE — Progress Notes (Signed)
Received from PACU, denies nausea/pain at this time. Parents at bedside.

## 2015-02-27 NOTE — ED Provider Notes (Signed)
Dr. Iran Planas called me. Patient has recent mastectomy and reconstruction. She initially requested CT angio but felt that it may not be necessary. Patient is currently on OR schedule later today. Please contact Dr. Iran Planas when patient is seen in the ED.   Wandra Arthurs, MD 02/27/15 1052

## 2015-02-27 NOTE — H&P (Signed)
Subjective:    Patient ID: Natasha Torres is a 34 y.o. female.  HPI  2.5 weeks post op bilateral nipple sparing mastectomy with tissue exapnder, acellular dermis reconstruction. Called this am with onset increased pain, not relieved with mediation, overall not feeling well and nausea/emesis last night. Noted increased cloudy drainage right chest during this time. No sick contacts at home.   Presented with palpable mass on left. MMG and US revealed 2 masses in breast. 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 Genetics with ATM mutation. Biopsy of the right breast mass and left axillary lymph node and the secondary mass left breast was benign. Completed neoadjuvant chemotherapy. Final pathology with no residual cancer, LN negative. Herceptin on hold until repeat ECHO.  Prior 35 C, desires this but fuller. Right mastectomy 418 g Left mastectomy 312 g   Review of Systems     Objective:   Physical Exam  Constitutional: She appears distressed.  Cardiovascular: Normal rate, regular rhythm and normal heart sounds.  Pulmonary/Chest: Effort normal and breath sounds normal.  l Chest: incisions intact, left chest drain watery, right cloudy Some faint erythema lower pole right chest     Assessment:     Left breast cancer, ATM mutation  Neoadjuvant chemotherapy S/p bilateral NSM, TE/ADM reconstruction    Plan:     Patient to go to [facility] ED for labs, IV. Discussed PE, but this is low likelihood and increased chest pain likely due infection right chest. Plan wash out and removal replacement expander on right. Spoke with [facility] ED.     Mentor Artoura High Profile 300 ml expander bilateral. Right fill volume 250 ml  Left fill volume 250 ml        Irene Limbo, MD [facility] Plastic & Reconstructive Surgery [phone]

## 2015-02-27 NOTE — Anesthesia Procedure Notes (Signed)
Procedure Name: LMA Insertion Date/Time: 02/27/2015 3:15 PM Performed by: Marinda Elk A Pre-anesthesia Checklist: Patient identified and Timeout performed Patient Re-evaluated:Patient Re-evaluated prior to inductionOxygen Delivery Method: Circle system utilized Preoxygenation: Pre-oxygenation with 100% oxygen Intubation Type: IV induction LMA Size: 4.0 Number of attempts: 1 Placement Confirmation: positive ETCO2 and breath sounds checked- equal and bilateral Tube secured with: Tape Dental Injury: Teeth and Oropharynx as per pre-operative assessment

## 2015-02-27 NOTE — Anesthesia Postprocedure Evaluation (Signed)
  Anesthesia Post-op Note  Patient: Natasha Torres  Procedure(s) Performed: Procedure(s): REMOVAL OF TISSUE EXPANDER AND PLACEMENT OF NEW TISSUE EXPANDER (Right)  Patient Location: PACU  Anesthesia Type:General  Level of Consciousness: awake and alert   Airway and Oxygen Therapy: Patient Spontanous Breathing  Post-op Pain: Controlled  Post-op Assessment: Post-op Vital signs reviewed, Patient's Cardiovascular Status Stable and Respiratory Function Stable  Post-op Vital Signs: Reviewed  Filed Vitals:   02/27/15 1736  BP: 124/43  Pulse: 93  Temp: 36.8 C  Resp: 16    Complications: No apparent anesthesia complications

## 2015-02-27 NOTE — Op Note (Signed)
Operative Note   DATE OF OPERATION: 9.11.2016  LOCATION: [place] [facility] OR- observation  SURGICAL DIVISION: Plastic Surgery  PREOPERATIVE DIAGNOSES:  1. Left breast cancer 2. Acquired absence bilateral breasts 3. 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 Cellulitis right reconstructed breast  POSTOPERATIVE DIAGNOSES:  same  PROCEDURE:  1. Removal, replacement of right breast tissue expander  SURGEON: Irene Limbo MD MBA  ASSISTANT: none  ANESTHESIA:  General.   EBL: 30 ml  COMPLICATIONS: None immediate.   INDICATIONS FOR PROCEDURE:  The patient, Natasha Torres, is a 34 y.o. female born on 1981-01-02, is here for irrigation breast pocket and exchange of right breast tissue expander for 24 hours of cloudy drainage from right breast, erythema.    FINDINGS: Moderate amount of cloudy fluid drained. Acellular dermis removed. New Mentor Artoura Ultra High profile 455 ml tissue expander placed, Initial fill volume 250 ml. Ref [id number] SN [id number]  DESCRIPTION OF PROCEDURE:  The patient's operative site was marked with the patient in the preoperative area. The patient was taken to the operating room. SCDs were placed. Patient had received IV antibiotics in the emergency department and no additional antibiotics were given. The patient's preexisting drain was removed. The patient's operative site was prepped and draped in a sterile fashion. A time out was performed and all information was confirmed to be correct. Incision made through prior inframammary incision. Skin flaps elevated off underlying muscle and fascial layer. On entering cavity moderate amount milky colored fluid drained. This was sent for culture. Expander removed. Examination of cavity revealed no other concerns, the area of acellular demis placement was not yet adherent to mastectomy flaps. Given the cellulitis and concern for contamination, I elected to remove the acellular dermis. Cavity treated by curettage and irrigated with  solution containing polymyxin and bacitracin. Additional irrigation with saline completed. Cavity inspected for hemostasis and new 15 Fr JP drain placed and secured with 2-0 nylon. New tissue expander prepared and placed in cavity. The previously placed size and profile of expander was not available for this urgent case; a Mentor Artoura expander with similar base width and height to opposite breast was chosen. The expander was secured to chest wall with 3-0 vicryl. Drain placed beneath mastectomy flap and above expander. Closure completed with 3-0 vicryl interrupted in superficial fascial layer, 4-0 vicryl in dermis and 4-0 nylon for skin closure. Tissue adhesive appleid followed by steri strips, dry dressing and binder.  Right breast port accessed and filled to 250 ml.   The patient was allowed to wake from anesthesia, extubated and taken to the recovery room in satisfactory condition.   SPECIMENS: cultures  DRAINS: 15 Fr JP in right reconstructed breast  Irene Limbo, MD [facility] Plastic & Reconstructive Surgery [phone]

## 2015-02-27 NOTE — Transfer of Care (Signed)
Immediate Anesthesia Transfer of Care Note  Patient: Natasha Torres  Procedure(s) Performed: Procedure(s): REMOVAL OF TISSUE EXPANDER AND PLACEMENT OF NEW TISSUE EXPANDER (Right)  Patient Location: PACU  Anesthesia Type:General  Level of Consciousness: awake  Airway & Oxygen Therapy: Patient Spontanous Breathing  Post-op Assessment: Report given to RN and Post -op Vital signs reviewed and stable  Post vital signs: Reviewed and stable  Last Vitals:  Filed Vitals:   02/27/15 1644  BP:   Pulse:   Temp: 37.2 C  Resp:     Complications: No apparent anesthesia complications

## 2015-02-27 NOTE — ED Provider Notes (Signed)
CSN: [id number]     Arrival date & time 02/27/15  1016 History   First MD Initiated Contact with Patient 02/27/15 1119     Chief Complaint  Patient presents with  . Dizziness  . Post-op Problem  . Nausea     HPI Pt was seen at 1110. Per pt, c/o gradual onset and persistence of constant right sided chest wall "pain" since last evening at 1700. Has been associated with N/V and "dizziness." Pt states she was evaluated by her Plastic Surgeon this morning for her symptoms, then sent to the ED for further evaluation/admission. Denies diarrhea, no black or blood in emesis, no palpitations, no fevers.     Past Medical History  Diagnosis Date  . Family history of adverse reaction to anesthesia     mom gets sick  . GERD (gastroesophageal reflux disease)     notices with chemo but not a chronic problem  . Nocturia   . Anxiety     takes Lorazepam daily if needed  . Insomnia     takes Ambien nightly if needed  . Depression     hx  . 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 Right oophorectomy  2012  . Wisdom tooth extraction  2001  . Orif finger / thumb fracture Right 1994    thumb  . Portacath placement  08/2014; 01/07/2015  . Complete mastectomy w/ sentinel node biopsy Left 02/10/2015  . Mastectomy Bilateral 02/10/2015  . Breast reconstruction with placement of tissue expander and flex hd (acellular hydrated dermis) Bilateral 02/10/2015  . Port-a-cath removal  01/07/2015  . Breast lumpectomy Left 08/2014  . Breast biopsy Bilateral 09/2014  . Nipple sparing mastectomy/sentinal lymph node biopsy/reconstruction/placement of tissue expander Bilateral 02/10/2015    Procedure: BILATERAL  NIPPLE SPARING MASTECTOMY WITH LEFT  SENTINAL LYMPH NODE BIOPSY(RIGHT BREAST PROPHYLACTIC);  Surgeon: Rolm Bookbinder, MD;  Location: [facility];  Service: General;   Laterality: Bilateral;  . Breast reconstruction with placement of tissue expander and flex hd (acellular hydrated dermis) Bilateral 02/10/2015    Procedure: BREAST RECONSTRUCTION WITH PLACEMENT OF TISSUE EXPANDER AND FLEX HD (ACELLULAR HYDRATED DERMIS);  Surgeon: Irene Limbo, MD;  Location: [facility];  Service: Plastics;  Laterality: Bilateral;   Family History  Problem Relation Age of Onset  . Hypertension Mother   . Depression Maternal Aunt   . Cancer Maternal Grandfather   . Cancer Paternal Grandfather   . Prostate cancer Father 60  . Dementia Maternal Grandmother   . Heart attack Maternal Grandfather   . Dementia Paternal Grandmother   . Kidney cancer Paternal Grandmother     slow growing, no treatment  . Prostate cancer Paternal Grandfather 70  . Bone cancer Paternal Grandfather 33  . Breast cancer Paternal Grandfather 41  . Lung cancer Paternal Grandfather     dx late 39s; smoker.  thought to be a 4th primary cancer  . Lung cancer Other     smoker  . Prostate cancer Other     MGMs 1/2 brother   Social History  Substance Use Topics  . Smoking status: Never Smoker   . Smokeless tobacco: Never Used  . Alcohol Use: 4.8 oz/week    0 Standard drinks or equivalent, 8 Glasses of wine per week   OB History    Gravida Para Term Preterm AB TAB  SAB Ectopic Multiple Living   1 1 1  0 0 0 0 0  1     Review of Systems ROS: Statement: All systems negative except as marked or noted in the HPI; Constitutional: Negative for fever and chills. ; ; Eyes: Negative for eye pain, redness and discharge. ; ; ENMT: Negative for ear pain, hoarseness, nasal congestion, sinus pressure and sore throat. ; ; Cardiovascular: Negative for chest pain, palpitations, diaphoresis, dyspnea and peripheral edema. ; ; Respiratory: Negative for cough, wheezing and stridor. ; ; Gastrointestinal: +N/V. Negative for diarrhea, abdominal pain, blood in stool, hematemesis, jaundice and rectal bleeding. . ; ; Genitourinary:  Negative for dysuria, flank pain and hematuria. ; ; Musculoskeletal: +chest wall pain. Negative for back pain and neck pain. Negative for swelling and trauma.; ; Skin: Negative for pruritus, rash, abrasions, blisters, bruising and skin lesion.; ; Neuro: +"dizziness." Negative for headache, lightheadedness and neck stiffness. Negative for weakness, altered level of consciousness , altered mental status, extremity weakness, paresthesias, involuntary movement, seizure and syncope.      Allergies  Buprenorphine hcl; Morphine and related; and Penicillins  Home Medications   Prior to Admission medications   Medication Sig Start Date End Date Taking? Authorizing Provider  BIOTIN PO Take 2 tablets by mouth daily.    Historical Provider, MD  candesartan (ATACAND) 4 MG tablet Take 1 tablet (4 mg total) by mouth every evening. 01/17/15   Larey Dresser, MD  carvedilol (COREG) 6.25 MG tablet Take 1 tablet (6.25 mg total) by mouth 2 (two) times daily with a meal. 02/03/15   Larey Dresser, MD  docusate sodium (COLACE) 100 MG capsule Take 100 mg by mouth daily as needed for mild constipation.    Historical Provider, MD  escitalopram (LEXAPRO) 10 MG tablet Take 10 mg by mouth.    Historical Provider, MD  HYDROcodone-acetaminophen (NORCO/VICODIN) 5-325 MG per tablet Take 1-2 tablets by mouth. 02/17/15   Historical Provider, MD  ibuprofen (ADVIL,MOTRIN) 200 MG tablet Take 200-400 mg by mouth every 6 (six) hours as needed for headache, mild pain or moderate pain.    Historical Provider, MD  lidocaine-prilocaine (EMLA) cream Apply to affected area once Patient taking differently: Apply 1 application topically as needed (pain). Apply to affected area once 09/20/14   Nicholas Lose, MD  loperamide (IMODIUM) 2 MG capsule Take 2 mg by mouth as needed for diarrhea or loose stools.    Historical Provider, MD  LORazepam (ATIVAN) 0.5 MG tablet Take 1 tablet (0.5 mg total) by mouth every 8 (eight) hours as needed. Patient taking  differently: Take 0.5 mg by mouth every 8 (eight) hours as needed for anxiety.  12/21/14   Nicholas Lose, MD  Melatonin 10 MG CAPS Take 10 mg by mouth.    Historical Provider, MD  Multiple Vitamin (MULTIVITAMIN) tablet Take 2 tablets by mouth daily.     Historical Provider, MD  oxyCODONE (OXY IR/ROXICODONE) 5 MG immediate release tablet Take 1-2 tablets (5-10 mg total) by mouth every 4 (four) hours as needed for moderate pain. 02/11/15   Irene Limbo, MD  Probiotic Product (PROBIOTIC DAILY PO) Take 1 capsule by mouth daily. Ultimate Flora    Historical Provider, MD  prochlorperazine (COMPAZINE) 10 MG tablet Take 1 tablet (10 mg total) by mouth every 6 (six) hours as needed (Nausea or vomiting). 09/20/14   Nicholas Lose, MD  sulfamethoxazole-trimethoprim (BACTRIM DS,SEPTRA DS) 800-160 MG per tablet Take 1 tablet by mouth 2 (two) times daily. 02/11/15  Irene Limbo, MD  tamoxifen (NOLVADEX) 20 MG tablet Take 1 tablet (20 mg total) by mouth daily. 02/22/15   Nicholas Lose, MD  UNABLE TO FIND 1 each by Other route daily. 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 02/22/15   Nicholas Lose, MD  zolpidem (AMBIEN) 5 MG tablet Take 5 mg by mouth at bedtime as needed for sleep.    Historical Provider, MD   BP 96/46 mmHg  Pulse 95  Temp(Src) 98.6 F (37 C) (Oral)  Resp 18  SpO2 100%   Filed Vitals:   02/27/15 1152 02/27/15 1230 02/27/15 1300 02/27/15 1324  BP: 96/46 97/48 104/57   Pulse: 95 83 84 81  Temp:      TempSrc:      Resp:  21 24 19   SpO2: 100% 99% 97% 100%     Physical Exam  1115: Physical examination:  Nursing notes reviewed; Vital signs and O2 SAT reviewed;  Constitutional: Well developed, Well nourished, In no acute distress; Head:  Normocephalic, atraumatic; Eyes: EOMI, PERRL, No  scleral icterus; ENMT: Mouth and pharynx normal, Mucous membranes dry; Neck: Supple, Full range of motion, No lymphadenopathy; Cardiovascular: Regular rate and rhythm, No gallop; Respiratory: Breath sounds clear & equal bilaterally, No rales, rhonchi, wheezes.  Speaking full sentences with ease, Normal respiratory effort/excursion; Chest: +right chest wall tender to palp. No rash. Movement normal; Abdomen: Soft, Nontender, Nondistended, Normal bowel sounds; Genitourinary: No CVA tenderness; Extremities: Pulses normal, No tenderness, No edema, No calf edema or asymmetry.; Neuro: AA&Ox3, Major CN grossly intact.  Speech clear. No gross focal motor or sensory deficits in extremities.; Skin: Color normal, Warm, Dry.   ED Course  Procedures (including critical care time) Labs Review   Imaging Review  I have personally reviewed and evaluated these images and lab results as part of my medical decision-making.   EKG Interpretation None      MDM  MDM Reviewed: previous chart, nursing note and vitals Reviewed previous: labs Interpretation: labs and x-ray   Results for orders placed or performed during the hospital encounter of 02/27/15  CBC with Differential/Platelet  Result Value Ref Range   WBC 13.6 (H) 4.0 - 10.5 K/uL   RBC 2.92 (L) 3.87 - 5.11 MIL/uL   Hemoglobin 9.2 (L) 12.0 - 15.0 g/dL   HCT 28.2 (L) 36.0 - 46.0 %   MCV 96.6 78.0 - 100.0 fL   MCH 31.5 26.0 - 34.0 pg   MCHC 32.6 30.0 - 36.0 g/dL   RDW 13.3 11.5 - 15.5 %   Platelets 194 150 - 400 K/uL   Neutrophils Relative % 90 (H) 43 - 77 %   Neutro Abs 12.1 (H) 1.7 - 7.7 K/uL   Lymphocytes Relative 5 (L) 12 - 46 %   Lymphs Abs 0.7 0.7 - 4.0 K/uL   Monocytes Relative 5 3 - 12 %   Monocytes Absolute 0.7 0.1 - 1.0 K/uL   Eosinophils Relative 0 0 - 5 %   Eosinophils Absolute 0.0 0.0 - 0.7 K/uL   Basophils Relative 0 0 - 1 %   Basophils Absolute 0.0 0.0 - 0.1 K/uL  Basic metabolic panel  Result Value Ref Range   Sodium 133 (L)  135 - 145 mmol/L   Potassium 3.7 3.5 - 5.1 mmol/L  Chloride 99 (L) 101 - 111 mmol/L   CO2 25 22 - 32 mmol/L   Glucose, Bld 132 (H) 65 - 99 mg/dL   BUN 16 6 - 20 mg/dL   Creatinine, Ser 0.81 0.44 - 1.00 mg/dL   Calcium 8.8 (L) 8.9 - 10.3 mg/dL   GFR calc non Af Amer >60 >60 mL/min   GFR calc Af Amer >60 >60 mL/min   Anion gap 9 5 - 15  Urinalysis, Routine w reflex microscopic  Result Value Ref Range   Color, Urine AMBER (A) YELLOW   APPearance CLOUDY (A) CLEAR   Specific Gravity, Urine 1.033 (H) 1.005 - 1.030   pH 6.0 5.0 - 8.0   Glucose, UA NEGATIVE NEGATIVE mg/dL   Hgb urine dipstick NEGATIVE NEGATIVE   Bilirubin Urine SMALL (A) NEGATIVE   Ketones, ur NEGATIVE NEGATIVE mg/dL   Protein, ur 30 (A) NEGATIVE mg/dL   Urobilinogen, UA 0.2 0.0 - 1.0 mg/dL   Nitrite NEGATIVE NEGATIVE   Leukocytes, UA NEGATIVE NEGATIVE  Pregnancy, urine  Result Value Ref Range   Preg Test, Ur NEGATIVE NEGATIVE  Hepatic function panel  Result Value Ref Range   Total Protein 6.2 (L) 6.5 - 8.1 g/dL   Albumin 3.2 (L) 3.5 - 5.0 g/dL   AST 24 15 - 41 U/L   ALT 13 (L) 14 - 54 U/L   Alkaline Phosphatase 41 38 - 126 U/L   Total Bilirubin 0.5 0.3 - 1.2 mg/dL   Bilirubin, Direct <0.1 (L) 0.1 - 0.5 mg/dL   Indirect Bilirubin NOT CALCULATED 0.3 - 0.9 mg/dL  Lipase, blood  Result Value Ref Range   Lipase 19 (L) 22 - 51 U/L  Urine microscopic-add on  Result Value Ref Range   WBC, UA 0-2 <3 WBC/hpf   RBC / HPF 0-2 <3 RBC/hpf   Urine-Other MUCOUS PRESENT   I-Stat CG4 Lactic Acid, ED  Result Value Ref Range   Lactic Acid, Venous 1.29 0.5 - 2.0 mmol/L    Dg Chest Port 1 View 02/27/2015   CLINICAL DATA:  Nausea vomiting and right-sided chest pain. History of breast cancer.  EXAM: PORTABLE CHEST - 1 VIEW  COMPARISON:  11/19/2014  FINDINGS: Bilateral chest wall tissue expanders noted. There is a right chest wall port a catheter with tip in the projection of the cavoatrial junction. The heart size and  mediastinal contours are within normal limits. Both lungs are clear. The visualized skeletal structures are unremarkable.  IMPRESSION: No acute cardiopulmonary abnormalities.   Electronically Signed   By: Kerby Moors M.D.   On: 02/27/2015 11:59    Ct Angio Chest Pe W/cm &/or Wo Cm 02/27/2015   CLINICAL DATA:  Right-sided chest pain since yesterday. Recent bilateral mastectomy for breast cancer 02/10/2015.  EXAM: CT ANGIOGRAPHY CHEST WITH CONTRAST  TECHNIQUE: Multidetector CT imaging of the chest was performed using the standard protocol during bolus administration of intravenous contrast. Multiplanar CT image reconstructions and MIPs were obtained to evaluate the vascular anatomy.  CONTRAST:  61m OMNIPAQUE IOHEXOL 350 MG/ML SOLN  COMPARISON:  Chest radiograph same date, chest CT 09/24/2014  FINDINGS: Mediastinum/Nodes: Thyroid grossly normal. Great vessels are normal in caliber. Triangular soft tissue within the anterior mediastinum may represent thymus in this young patient. Right-sided Port-A-Cath in place with tip in the right atrium. Heart size is normal. No lymphadenopathy.  Streak artifact from bilateral tissue expanders within mastectomy bed. Surgical drains in place.  The study is suboptimal for evaluation for pulmonary embolism due  to streak artifact and bolus timing but accounting for this there is no evidence for filling defect to suggest pulmonary embolism up to the third order pulmonary arteries.  Lungs/Pleura: Small right and trace left pleural effusions with associated compressive atelectasis. Central airways are patent.  Upper abdomen: Grossly normal  Musculoskeletal: No acute osseous abnormality.  Review of the MIP images confirms the above findings.  IMPRESSION: No CT evidence for acute pulmonary embolism up to the third order arteries, allowing for image degradation as above.  Small right and trace left pleural effusions with associated compressive atelectasis.   Electronically Signed   By:  Conchita Paris M.D.   On: 02/27/2015 14:30     1240:  IV NS 1L bolus ordered. IV fentanyl and zofran given for pain/nausea. Shortly after my evaluation, pt's Plastic Surgeon placed orders for admission. Pt aware.   Francine Graven, DO 03/01/15 [phone]

## 2015-02-27 NOTE — ED Notes (Signed)
Pt reports bilateral mastectomy, with tissue expanders 8-25, Onset last night pain increased on right side of chest, nausea, vomiting, dizziness.  Pt seen her plastic surgeon this morning who sent her to ED, as she thinks right side is infected.

## 2015-02-27 NOTE — ED Notes (Signed)
IV team at bedside 

## 2015-02-27 NOTE — Anesthesia Preprocedure Evaluation (Addendum)
Anesthesia Evaluation  Patient identified by MRN, date of birth, ID band Patient awake    Reviewed: Allergy & Precautions, H&P , NPO status , Patient's Chart, lab work & pertinent test results, reviewed documented beta blocker date and time   Airway Mallampati: I  TM Distance: >3 FB     Dental no notable dental hx. (+) Teeth Intact, Dental Advisory Given   Pulmonary neg pulmonary ROS,    Pulmonary exam normal breath sounds clear to auscultation       Cardiovascular  Rhythm:Regular Rate:Normal  Non ischemic cardiomyopathy EF45%   Neuro/Psych Anxiety Depression negative neurological ROS     GI/Hepatic Neg liver ROS, GERD  Controlled,  Endo/Other  negative endocrine ROS  Renal/GU negative Renal ROS  negative genitourinary   Musculoskeletal   Abdominal   Peds  Hematology negative hematology ROS (+)   Anesthesia Other Findings   Reproductive/Obstetrics negative OB ROS                           Anesthesia Physical Anesthesia Plan  ASA: II  Anesthesia Plan: General   Post-op Pain Management:    Induction: Intravenous  Airway Management Planned: LMA  Additional Equipment:   Intra-op Plan:   Post-operative Plan: Extubation in OR  Informed Consent: I have reviewed the patients History and Physical, chart, labs and discussed the procedure including the risks, benefits and alternatives for the proposed anesthesia with the patient or authorized representative who has indicated his/her understanding and acceptance.   Dental advisory given  Plan Discussed with: CRNA  Anesthesia Plan Comments:         Anesthesia Quick Evaluation

## 2015-02-27 NOTE — ED Notes (Signed)
Brown drainage noted at incision site.

## 2015-02-27 NOTE — ED Notes (Signed)
Patient states she would rather have her port access. Nurse placed IV team order.

## 2015-02-27 NOTE — ED Notes (Signed)
Orthostatics attempted by NT states she  Became dizzy when standing.

## 2015-02-28 ENCOUNTER — Encounter (HOSPITAL_COMMUNITY): Payer: Self-pay | Admitting: Plastic Surgery

## 2015-02-28 ENCOUNTER — Ambulatory Visit: Payer: Managed Care, Other (non HMO) | Admitting: Physical Therapy

## 2015-02-28 MED ORDER — CIPROFLOXACIN HCL 500 MG PO TABS: 500.0000 mg | ORAL_TABLET | Freq: Two times a day (BID) | ORAL | Status: DC

## 2015-02-28 MED ORDER — HYDROMORPHONE HCL 1 MG/ML IJ SOLN
0.5000 mg | INTRAMUSCULAR | Status: DC | PRN
  Administered 2015-02-28: 0.5 mg via INTRAVENOUS
  Administered 2015-02-28: 1 mg via INTRAVENOUS
  Filled 2015-02-28 (×2): qty 1

## 2015-02-28 MED ORDER — OXYCODONE HCL 5 MG PO TABS: 5.0000 mg | ORAL_TABLET | ORAL | Status: DC | PRN

## 2015-02-28 MED ORDER — SENNOSIDES-DOCUSATE SODIUM 8.6-50 MG PO TABS
1.0000 | ORAL_TABLET | Freq: Once | ORAL | Status: DC
  Administered 2015-02-28: 1 via ORAL
  Filled 2015-02-28: qty 1

## 2015-02-28 NOTE — Progress Notes (Signed)
POD#1 irrigation right breast pocket, removal and replacement expander  Temp:  [98.1 F (36.7 C)-98.9 F (37.2 C)] 98.3 F (36.8 C) (09/12 0637) Pulse Rate:  [69-100] 70 (09/12 0637) Resp:  [16-24] 18 (09/12 0637) BP: (87-134)/(36-58) 117/49 mmHg (09/12 0637) SpO2:  [95 %-100 %] 98 % (09/12 0637) Weight:  [88.451 kg (195 lb)] 88.451 kg (195 lb) (09/11 1736)   JP R 50 ml L 45 ml PO 480 Reports pain significantly improved  PE  Left chest no erythema, soft, right flat, drain serosanguinous, improved erythema    A/P F/u wound cultures- no report yet Bowel regimen- no BM for 3 days Ok to shower in 24 hours F/u arranged in 3 days. Reviewed side effects of cipro Wants to go back to oxycodone for home  Irene Limbo, MD [facility]astic [facility] [phone]

## 2015-02-28 NOTE — Discharge Summary (Signed)
Physician Discharge Summary  Patient ID: Natasha Torres MRN: [id number] DOB/AGE: 1980-11-03 34 y.o.  Admit date: 02/27/2015 Discharge date: 02/28/2015  Admission Diagnoses: Acquired absence breasts, left breast cancer, cellulitis right reconstructed breast Discharge Diagnoses:  same  Discharged Condition: stable  Hospital Course: Presented with 24 hours increasing pain, nausea vomiting and increased cloudy drainage right reconstructed breast. Patient recently underwent bilateral nipple sparing mastectomies for left breast cancer with reconstruction with expanders. Work up in ED revealed elevated WBC, negative CT angio for PE. Taken to OR on day of admission and right breast pocket irrigated, acellular dermis removed and new expander placed. Patient pain significantly improved following this. Left breast remained benign. Cellulitis improved. Cultures pending at time of discharge.   Significant Diagnostic Studies: radiology: CT scan: negative CT angio chest for PE  Treatments: surgery: irrigation right breast pocket with removal and replacement right tissue expander  Discharge Exam: Blood pressure 117/49, pulse 70, temperature 98.3 F (36.8 C), temperature source Oral, resp. rate 18, height 5\' 11"  (1.803 m), weight 88.451 kg (195 lb), SpO2 98 %. Incision/Wound: incision dry intact, some scabbing over nipple on right, faint pink color breast on right improved , left benign appearing. Drains serosanguinous on right, serous on left  Disposition: 01-Home or Self Care  Discharge Instructions    Call MD for:  redness, tenderness, or signs of infection (pain, swelling, bleeding, redness, odor or green/yellow discharge around incision site)    Complete by:  As directed      Call MD for:  severe or increased pain, loss or decreased feeling  in affected limb(s)    Complete by:  As directed      Discharge instructions    Complete by:  As directed   Ok to shower am 03/01/15. Pat incisions / steri  strips dry. Soft sports bra or breast binder other times.  Strip and record drains twice daily.  Hold PT until next follow up visit.  No strenuous activity, housework, exercise.     Driving Restrictions    Complete by:  As directed   No driving while taking narcotics     Lifting restrictions    Complete by:  As directed   No lifting greater than 5 lbs     Resume previous diet    Complete by:  As directed             Medication List    STOP taking these medications        HYDROcodone-acetaminophen 5-325 MG per tablet  Commonly known as:  NORCO/VICODIN      TAKE these medications        BIOTIN PO  Take 2 tablets by mouth daily.     candesartan 4 MG tablet  Commonly known as:  ATACAND  Take 1 tablet (4 mg total) by mouth every evening.     carvedilol 6.25 MG tablet  Commonly known as:  COREG  Take 1 tablet (6.25 mg total) by mouth 2 (two) times daily with a meal.     ciprofloxacin 500 MG tablet  Commonly known as:  CIPRO  Take 1 tablet (500 mg total) by mouth 2 (two) times daily.     docusate sodium 100 MG capsule  Commonly known as:  COLACE  Take 100 mg by mouth daily as needed for mild constipation.     ibuprofen 200 MG tablet  Commonly known as:  ADVIL,MOTRIN  Take 200-400 mg by mouth every 6 (six) hours as needed for headache, mild  pain or moderate pain.     lidocaine-prilocaine cream  Commonly known as:  EMLA  Apply to affected area once     loperamide 2 MG capsule  Commonly known as:  IMODIUM  Take 2 mg by mouth as needed for diarrhea or loose stools.     LORazepam 0.5 MG tablet  Commonly known as:  ATIVAN  Take 1 tablet (0.5 mg total) by mouth every 8 (eight) hours as needed.     Melatonin 10 MG Caps  Take 10 mg by mouth.     multivitamin tablet  Take 2 tablets by mouth daily.     ondansetron 4 MG tablet  Commonly known as:  ZOFRAN  Take 4 mg by mouth every 8 (eight) hours as needed for nausea or vomiting.     oxyCODONE 5 MG immediate  release tablet  Commonly known as:  Oxy IR/ROXICODONE  Take 1-2 tablets (5-10 mg total) by mouth every 4 (four) hours as needed for moderate pain.     PROBIOTIC DAILY PO  Take 1 capsule by mouth daily. Ultimate Flora     prochlorperazine 10 MG tablet  Commonly known as:  COMPAZINE  Take 1 tablet (10 mg total) by mouth every 6 (six) hours as needed (Nausea or vomiting).     tamoxifen 20 MG tablet  Commonly known as:  NOLVADEX  Take 1 tablet (20 mg total) by mouth daily.     UNABLE TO FIND  1 each by Other route daily. 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     zolpidem 5 MG tablet  Commonly known as:  AMBIEN  Take 5 mg by mouth at bedtime as needed for sleep.           Follow-up Information    Follow up with Irene Limbo, MD On 03/03/2015.   Specialty:  Plastic Surgery   Why:  as scheduled   Contact information:   [address] [place] [address] [phone]       Signed: Irene Limbo 02/28/2015, 8:18 AM

## 2015-02-28 NOTE — Progress Notes (Signed)
AVS discharge instructions were reviewed with patient. Patient was also given prescription for oxycodone and cipro to take to her pharmacy. Patient stated that she was comfortable with emptying, measuring and recording measurement from JP drains. Patient has had left JP prior to this admission. Patient was given a handout on how to care for JP. Patient stated that she did not have any questions. Volunteers assisted patient  to her transportation.

## 2015-03-01 ENCOUNTER — Encounter (HOSPITAL_COMMUNITY): Payer: Self-pay | Admitting: *Deleted

## 2015-03-01 NOTE — Progress Notes (Signed)
Pt denies SOB and chest pain but is under the care of Dr. Algernon Huxley, cardiology. Pt made aware to stop taking Aspirin, otc vitamins and herbal medications such as Melatonin. Do not take any NSAIDs ie: Ibuprofen, Advil, Naproxen or any medication containing Aspirin. Pt verbalized understanding of all pre-op instructions.

## 2015-03-02 ENCOUNTER — Ambulatory Visit (HOSPITAL_COMMUNITY): Payer: Managed Care, Other (non HMO) | Admitting: Anesthesiology

## 2015-03-02 ENCOUNTER — Encounter (HOSPITAL_COMMUNITY): Payer: Self-pay | Admitting: *Deleted

## 2015-03-02 ENCOUNTER — Observation Stay (HOSPITAL_COMMUNITY)
Admission: RE | Admit: 2015-03-02 | Discharge: 2015-03-03 | Disposition: A | Discharge status: Home / Self Care | Payer: Managed Care, Other (non HMO) | Source: Ambulatory Visit | Attending: Plastic Surgery | Admitting: Plastic Surgery

## 2015-03-02 ENCOUNTER — Encounter (HOSPITAL_COMMUNITY)
Admission: RE | Disposition: A | Discharge status: Home / Self Care | Payer: Self-pay | Source: Ambulatory Visit | Attending: Plastic Surgery

## 2015-03-02 DIAGNOSIS — Y838 Other surgical procedures as the cause of abnormal reaction of the patient, or of later complication, without mention of misadventure at the time of the procedure: Secondary | ICD-10-CM | POA: Diagnosis not present

## 2015-03-02 DIAGNOSIS — T8579XA Infection and inflammatory reaction due to other internal prosthetic devices, implants and grafts, initial encounter: Principal | ICD-10-CM | POA: Insufficient documentation

## 2015-03-02 DIAGNOSIS — Z9013 Acquired absence of bilateral breasts and nipples: Secondary | ICD-10-CM | POA: Diagnosis not present

## 2015-03-02 DIAGNOSIS — Z170 Estrogen receptor positive status [ER+]: Secondary | ICD-10-CM | POA: Insufficient documentation

## 2015-03-02 DIAGNOSIS — Z9010 Acquired absence of unspecified breast and nipple: Secondary | ICD-10-CM

## 2015-03-02 HISTORY — PX: INCISION AND DRAINAGE OF WOUND: SHX1803

## 2015-03-02 HISTORY — PX: TISSUE EXPANDER PLACEMENT: SHX2530

## 2015-03-02 SURGERY — IRRIGATION AND DEBRIDEMENT WOUND
Anesthesia: General | Site: Breast | Laterality: Left

## 2015-03-02 MED ORDER — CIPROFLOXACIN HCL 500 MG PO TABS: 500.0000 mg | ORAL_TABLET | Freq: Two times a day (BID) | ORAL | Status: DC

## 2015-03-02 MED ORDER — ROCURONIUM BROMIDE 100 MG/10ML IV SOLN
INTRAVENOUS | Status: DC | PRN
  Administered 2015-03-02: 40 mg via INTRAVENOUS

## 2015-03-02 MED ORDER — LIDOCAINE HCL (CARDIAC) 20 MG/ML IV SOLN
INTRAVENOUS | Status: DC | PRN
  Administered 2015-03-02: 60 mg via INTRAVENOUS

## 2015-03-02 MED ORDER — 0.9 % SODIUM CHLORIDE (POUR BTL) OPTIME
TOPICAL | Status: DC | PRN
  Administered 2015-03-02: 1000 mL

## 2015-03-02 MED ORDER — CARVEDILOL 6.25 MG PO TABS
6.2500 mg | ORAL_TABLET | Freq: Two times a day (BID) | ORAL | Status: DC
  Administered 2015-03-02 – 2015-03-03 (×2): 6.25 mg via ORAL
  Filled 2015-03-02 (×2): qty 1

## 2015-03-02 MED ORDER — FENTANYL CITRATE (PF) 100 MCG/2ML IJ SOLN
INTRAMUSCULAR | Status: AC
  Administered 2015-03-02: 50 ug via INTRAVENOUS
  Filled 2015-03-02: qty 2

## 2015-03-02 MED ORDER — TAMOXIFEN CITRATE 10 MG PO TABS
20.0000 mg | ORAL_TABLET | Freq: Every day | ORAL | Status: DC
  Filled 2015-03-02 (×2): qty 2

## 2015-03-02 MED ORDER — PROPOFOL 10 MG/ML IV BOLUS
INTRAVENOUS | Status: DC | PRN
  Administered 2015-03-02: 150 mg via INTRAVENOUS

## 2015-03-02 MED ORDER — IRBESARTAN 75 MG PO TABS
37.5000 mg | ORAL_TABLET | Freq: Every day | ORAL | Status: DC
  Administered 2015-03-02: 37.5 mg via ORAL
  Filled 2015-03-02 (×2): qty 1

## 2015-03-02 MED ORDER — OXYCODONE HCL 5 MG PO TABS
5.0000 mg | ORAL_TABLET | ORAL | Status: DC | PRN
  Administered 2015-03-02: 5 mg via ORAL
  Administered 2015-03-03: 10 mg via ORAL
  Filled 2015-03-02 (×3): qty 1

## 2015-03-02 MED ORDER — LORAZEPAM 0.5 MG PO TABS: 0.5000 mg | ORAL_TABLET | Freq: Three times a day (TID) | ORAL | Status: DC | PRN

## 2015-03-02 MED ORDER — NEOSTIGMINE METHYLSULFATE 10 MG/10ML IV SOLN
INTRAVENOUS | Status: DC | PRN
  Administered 2015-03-02: 2 mg via INTRAVENOUS

## 2015-03-02 MED ORDER — LACTATED RINGERS IV SOLN
INTRAVENOUS | Status: DC
  Administered 2015-03-02: 13:00:00 via INTRAVENOUS

## 2015-03-02 MED ORDER — ONDANSETRON HCL 4 MG/2ML IJ SOLN
4.0000 mg | Freq: Four times a day (QID) | INTRAMUSCULAR | Status: DC | PRN
  Administered 2015-03-03 (×2): 4 mg via INTRAVENOUS
  Filled 2015-03-02 (×2): qty 2

## 2015-03-02 MED ORDER — IBUPROFEN 200 MG PO TABS: 200.0000 mg | ORAL_TABLET | Freq: Four times a day (QID) | ORAL | Status: DC | PRN

## 2015-03-02 MED ORDER — CIPROFLOXACIN IN D5W 400 MG/200ML IV SOLN
400.0000 mg | Freq: Two times a day (BID) | INTRAVENOUS | Status: DC
  Administered 2015-03-02 – 2015-03-03 (×2): 400 mg via INTRAVENOUS
  Filled 2015-03-02 (×3): qty 200

## 2015-03-02 MED ORDER — LACTATED RINGERS IV SOLN
INTRAVENOUS | Status: DC | PRN
  Administered 2015-03-02: 13:00:00 via INTRAVENOUS

## 2015-03-02 MED ORDER — OXYCODONE HCL 5 MG PO TABS
5.0000 mg | ORAL_TABLET | Freq: Once | ORAL | Status: AC | PRN
  Administered 2015-03-02: 5 mg via ORAL

## 2015-03-02 MED ORDER — OXYCODONE HCL 5 MG/5ML PO SOLN
5.0000 mg | Freq: Once | ORAL | Status: AC | PRN
Start: 2015-03-02 — End: 2015-03-02

## 2015-03-02 MED ORDER — KCL IN DEXTROSE-NACL 20-5-0.45 MEQ/L-%-% IV SOLN
INTRAVENOUS | Status: DC
Start: 2015-03-02 — End: 2015-03-03
  Administered 2015-03-03: 04:00:00 via INTRAVENOUS
  Filled 2015-03-02: qty 1000

## 2015-03-02 MED ORDER — DOCUSATE SODIUM 100 MG PO CAPS
100.0000 mg | ORAL_CAPSULE | Freq: Every day | ORAL | Status: DC | PRN
  Administered 2015-03-02: 100 mg via ORAL
  Filled 2015-03-02: qty 1

## 2015-03-02 MED ORDER — POVIDONE-IODINE 10 % EX SOLN
CUTANEOUS | Status: DC | PRN
  Administered 2015-03-02: 1 via TOPICAL

## 2015-03-02 MED ORDER — VANCOMYCIN HCL IN DEXTROSE 1-5 GM/200ML-% IV SOLN
1000.0000 mg | INTRAVENOUS | Status: AC
  Administered 2015-03-02: 1000 mg via INTRAVENOUS
  Filled 2015-03-02: qty 200

## 2015-03-02 MED ORDER — HYDROMORPHONE HCL 1 MG/ML IJ SOLN
INTRAMUSCULAR | Status: AC
  Filled 2015-03-02: qty 1

## 2015-03-02 MED ORDER — SCOPOLAMINE 1 MG/3DAYS TD PT72
MEDICATED_PATCH | TRANSDERMAL | Status: AC
  Administered 2015-03-02: 1 via TRANSDERMAL
  Filled 2015-03-02: qty 1

## 2015-03-02 MED ORDER — PROMETHAZINE HCL 25 MG/ML IJ SOLN: 6.2500 mg | INTRAMUSCULAR | Status: DC | PRN

## 2015-03-02 MED ORDER — MIDAZOLAM HCL 2 MG/2ML IJ SOLN
INTRAMUSCULAR | Status: AC
  Filled 2015-03-02: qty 4

## 2015-03-02 MED ORDER — ONDANSETRON HCL 4 MG/2ML IJ SOLN
4.0000 mg | Freq: Once | INTRAMUSCULAR | Status: AC
  Administered 2015-03-02: 4 mg via INTRAVENOUS
  Filled 2015-03-02 (×2): qty 2

## 2015-03-02 MED ORDER — FENTANYL CITRATE (PF) 250 MCG/5ML IJ SOLN
INTRAMUSCULAR | Status: AC
  Filled 2015-03-02: qty 5

## 2015-03-02 MED ORDER — FENTANYL CITRATE (PF) 100 MCG/2ML IJ SOLN
25.0000 ug | INTRAMUSCULAR | Status: DC | PRN
  Administered 2015-03-02 (×3): 50 ug via INTRAVENOUS

## 2015-03-02 MED ORDER — MIDAZOLAM HCL 5 MG/5ML IJ SOLN
INTRAMUSCULAR | Status: DC | PRN
  Administered 2015-03-02: 2 mg via INTRAVENOUS

## 2015-03-02 MED ORDER — FENTANYL CITRATE (PF) 100 MCG/2ML IJ SOLN
INTRAMUSCULAR | Status: AC
  Filled 2015-03-02: qty 2

## 2015-03-02 MED ORDER — FENTANYL CITRATE (PF) 100 MCG/2ML IJ SOLN
INTRAMUSCULAR | Status: DC | PRN
  Administered 2015-03-02 (×3): 50 ug via INTRAVENOUS

## 2015-03-02 MED ORDER — HYDROMORPHONE HCL 1 MG/ML IJ SOLN
0.5000 mg | INTRAMUSCULAR | Status: DC | PRN
  Administered 2015-03-02 (×3): 0.5 mg via INTRAVENOUS

## 2015-03-02 MED ORDER — HYDROMORPHONE HCL 1 MG/ML IJ SOLN
0.5000 mg | INTRAMUSCULAR | Status: DC | PRN
  Administered 2015-03-02 – 2015-03-03 (×4): 1 mg via INTRAVENOUS
  Filled 2015-03-02 (×4): qty 1

## 2015-03-02 MED ORDER — SODIUM CHLORIDE 0.9 % IR SOLN
Status: DC | PRN
  Administered 2015-03-02: 500 mL

## 2015-03-02 MED ORDER — ARTIFICIAL TEARS OP OINT
TOPICAL_OINTMENT | OPHTHALMIC | Status: DC | PRN
  Administered 2015-03-02: 1 via OPHTHALMIC

## 2015-03-02 MED ORDER — GLYCOPYRROLATE 0.2 MG/ML IJ SOLN
INTRAMUSCULAR | Status: DC | PRN
  Administered 2015-03-02: 0.4 mg via INTRAVENOUS

## 2015-03-02 MED ORDER — VENLAFAXINE HCL ER 37.5 MG PO CP24
37.5000 mg | ORAL_CAPSULE | Freq: Every day | ORAL | Status: DC
  Administered 2015-03-02: 37.5 mg via ORAL
  Filled 2015-03-02 (×2): qty 1

## 2015-03-02 MED ORDER — ONDANSETRON HCL 4 MG/2ML IJ SOLN
INTRAMUSCULAR | Status: DC | PRN
  Administered 2015-03-02: 4 mg via INTRAVENOUS

## 2015-03-02 MED ORDER — OXYCODONE HCL 5 MG PO TABS
ORAL_TABLET | ORAL | Status: AC
  Administered 2015-03-02: 5 mg via ORAL
  Filled 2015-03-02: qty 1

## 2015-03-02 MED ORDER — ONDANSETRON 4 MG PO TBDP: 4.0000 mg | ORAL_TABLET | Freq: Four times a day (QID) | ORAL | Status: DC | PRN

## 2015-03-02 SURGICAL SUPPLY — 70 items
ADH SKN CLS APL DERMABOND .7 (GAUZE/BANDAGES/DRESSINGS) ×1
BAG DECANTER FOR FLEXI CONT (MISCELLANEOUS) ×3 IMPLANT
BINDER BREAST LRG (GAUZE/BANDAGES/DRESSINGS) IMPLANT
BINDER BREAST XLRG (GAUZE/BANDAGES/DRESSINGS) ×1 IMPLANT
BLADE 10 SAFETY STRL DISP (BLADE) ×2 IMPLANT
BLADE SURG ROTATE 9660 (MISCELLANEOUS) IMPLANT
BNDG GAUZE ELAST 4 BULKY (GAUZE/BANDAGES/DRESSINGS) IMPLANT
CANISTER SUCTION 2500CC (MISCELLANEOUS) ×2 IMPLANT
CHLORAPREP W/TINT 26ML (MISCELLANEOUS) ×2 IMPLANT
CONT SPEC STER OR (MISCELLANEOUS) IMPLANT
COVER SURGICAL LIGHT HANDLE (MISCELLANEOUS) ×2 IMPLANT
DERMABOND ADVANCED (GAUZE/BANDAGES/DRESSINGS) ×1
DERMABOND ADVANCED .7 DNX12 (GAUZE/BANDAGES/DRESSINGS) ×1 IMPLANT
DRAIN CHANNEL 15F RND FF W/TCR (WOUND CARE) ×2 IMPLANT
DRAPE IMP U-DRAPE 54X76 (DRAPES) ×2 IMPLANT
DRAPE INCISE IOBAN 66X45 STRL (DRAPES) IMPLANT
DRAPE ORTHO SPLIT 77X108 STRL (DRAPES) ×4
DRAPE PED LAPAROTOMY (DRAPES) ×2 IMPLANT
DRAPE PROXIMA HALF (DRAPES) IMPLANT
DRAPE SURG 17X23 STRL (DRAPES) ×4 IMPLANT
DRAPE SURG ORHT 6 SPLT 77X108 (DRAPES) ×2 IMPLANT
DRAPE WARM FLUID 44X44 (DRAPE) ×2 IMPLANT
DRSG ADAPTIC 3X8 NADH LF (GAUZE/BANDAGES/DRESSINGS) IMPLANT
DRSG PAD ABDOMINAL 8X10 ST (GAUZE/BANDAGES/DRESSINGS) ×3 IMPLANT
DRSG VAC ATS LRG SENSATRAC (GAUZE/BANDAGES/DRESSINGS) IMPLANT
DRSG VAC ATS MED SENSATRAC (GAUZE/BANDAGES/DRESSINGS) IMPLANT
DRSG VAC ATS SM SENSATRAC (GAUZE/BANDAGES/DRESSINGS) IMPLANT
ELECT BLADE 4.0 EZ CLEAN MEGAD (MISCELLANEOUS) ×2
ELECT CAUTERY BLADE 6.4 (BLADE) ×2 IMPLANT
ELECT REM PT RETURN 9FT ADLT (ELECTROSURGICAL) ×2
ELECTRODE BLDE 4.0 EZ CLN MEGD (MISCELLANEOUS) ×1 IMPLANT
ELECTRODE REM PT RTRN 9FT ADLT (ELECTROSURGICAL) ×1 IMPLANT
EVACUATOR SILICONE 100CC (DRAIN) ×2 IMPLANT
EXPANDER BREAST 455CC (Expander) ×1 IMPLANT
GAUZE SPONGE 4X4 12PLY STRL (GAUZE/BANDAGES/DRESSINGS) ×2 IMPLANT
GLOVE BIO SURGEON STRL SZ 6 (GLOVE) ×2 IMPLANT
GLOVE BIOGEL PI IND STRL 7.5 (GLOVE) IMPLANT
GLOVE BIOGEL PI INDICATOR 7.5 (GLOVE) ×2
GLOVE SURG SS PI 6.0 STRL IVOR (GLOVE) ×2 IMPLANT
GLOVE SURG SS PI 7.5 STRL IVOR (GLOVE) ×1 IMPLANT
GOWN STRL REUS W/ TWL LRG LVL3 (GOWN DISPOSABLE) ×2 IMPLANT
GOWN STRL REUS W/TWL LRG LVL3 (GOWN DISPOSABLE) ×4
HANDPIECE INTERPULSE COAX TIP (DISPOSABLE)
KIT BASIN OR (CUSTOM PROCEDURE TRAY) ×2 IMPLANT
KIT ROOM TURNOVER OR (KITS) ×2 IMPLANT
NS IRRIG 1000ML POUR BTL (IV SOLUTION) ×4 IMPLANT
PACK GENERAL/GYN (CUSTOM PROCEDURE TRAY) ×2 IMPLANT
PACK UNIVERSAL I (CUSTOM PROCEDURE TRAY) ×2 IMPLANT
PAD ARMBOARD 7.5X6 YLW CONV (MISCELLANEOUS) ×4 IMPLANT
PIN SAFETY STERILE (MISCELLANEOUS) ×2 IMPLANT
SET HNDPC FAN SPRY TIP SCT (DISPOSABLE) IMPLANT
SOLUTION BETADINE 4OZ (MISCELLANEOUS) IMPLANT
STAPLER VISISTAT 35W (STAPLE) ×2 IMPLANT
STRIP CLOSURE SKIN 1/2X4 (GAUZE/BANDAGES/DRESSINGS) ×1 IMPLANT
SURGILUBE 2OZ TUBE FLIPTOP (MISCELLANEOUS) IMPLANT
SUT ETHILON 2 0 FS 18 (SUTURE) ×3 IMPLANT
SUT ETHILON 4 0 PS 2 18 (SUTURE) ×1 IMPLANT
SUT MNCRL AB 4-0 PS2 18 (SUTURE) ×2 IMPLANT
SUT VIC AB 3-0 PS2 18 (SUTURE) ×2
SUT VIC AB 3-0 PS2 18XBRD (SUTURE) ×1 IMPLANT
SUT VIC AB 3-0 SH 27 (SUTURE) ×2
SUT VIC AB 3-0 SH 27X BRD (SUTURE) IMPLANT
SUT VIC AB 4-0 PS2 27 (SUTURE) ×3 IMPLANT
SUT VIC AB 5-0 PS2 18 (SUTURE) IMPLANT
SWAB COLLECTION DEVICE MRSA (MISCELLANEOUS) IMPLANT
TOWEL OR 17X24 6PK STRL BLUE (TOWEL DISPOSABLE) ×2 IMPLANT
TOWEL OR 17X26 10 PK STRL BLUE (TOWEL DISPOSABLE) ×2 IMPLANT
TRAY FOLEY CATH 14FRSI W/METER (CATHETERS) IMPLANT
TUBE ANAEROBIC SPECIMEN COL (MISCELLANEOUS) IMPLANT
UNDERPAD 30X30 INCONTINENT (UNDERPADS AND DIAPERS) ×2 IMPLANT

## 2015-03-02 NOTE — H&P (Signed)
  Subjective:    Patient ID: Natasha Torres is a 34 y.o. female.  HPI  3 weeks post op bilateral nipple sparing mastectomy with tissue exapnder, acellular dermis reconstruction for left breast cancer. 3 days post removal/replacement right tissue expander for presentation with cellulitis. She since has developed cellulitis left breast and plan similar wash out of cavity with replacment of expander, removal acellular dermis which is likely not incorporated at this time. Right cavity with S. Aureus on culture.    Presented with palpable mass on left. MMG and US revealed 2 masses in breast. 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 Genetics with ATM mutation. Biopsy of the right breast mass and left axillary lymph node and the secondary mass left breast was benign. Completed neoadjuvant chemotherapy. Final pathology with no residual cancer, LN negative. Herceptin on hold until repeat ECHO.    Prior 35 C Right mastectomy 418 g Left mastectomy 312 g   Review of Systems   Objective:   Physical Exam   Cardiovascular: Normal rate, regular rhythm and normal heart sounds.  Pulmonary/Chest: Effort normal and breath sounds normal.  l  Chest: incisions intact, right chest drain serosanguinous. Left reconstructed breast with cellulitis lower poles   Assessment:   Left breast cancer, ATM mutation  Neoadjuvant chemotherapy  S/p bilateral NSM, TE/ADM reconstruction    Plan:   Washout and removal replacement left breast expander. Patient understands if this does not clear cellulitis, may require removal implants   [place] Artoura Ultra High Profile 455 ml expander right .  Right fill volume 250 ml  Mentor Artoura High Profile 300 ml Left fill volume 250 ml    Irene Limbo, MD [facility] Plastic & Reconstructive Surgery [phone]

## 2015-03-02 NOTE — Transfer of Care (Signed)
Immediate Anesthesia Transfer of Care Note  Patient: Natasha Torres  Procedure(s) Performed: Procedure(s): IRRIGATION BREAST POCKET AND EXCHANGE OF LEFT BREAST TISSUE EXPANDER (Left) TISSUE EXPANDER (Left)  Patient Location: PACU  Anesthesia Type:General  Level of Consciousness: awake, alert  and oriented  Airway & Oxygen Therapy: Patient Spontanous Breathing and Patient connected to nasal cannula oxygen  Post-op Assessment: Report given to RN, Post -op Vital signs reviewed and stable and Patient moving all extremities X 4  Post vital signs: Reviewed and stable  Last Vitals:  Filed Vitals:   03/02/15 1132  BP: 136/67  Pulse: 71  Temp: 37.1 C  Resp: 18    Complications: No apparent anesthesia complications

## 2015-03-02 NOTE — OR Nursing (Signed)
Natasha Torres continues to have significant pain after Fentanyl 150 mcg, Oxy IR 5 mg, and Dilaudid 0.5 mg.  Dr. Iran Planas notified and awaiting orders.

## 2015-03-02 NOTE — Anesthesia Postprocedure Evaluation (Signed)
  Anesthesia Post-op Note  Patient: Natasha Torres  Procedure(s) Performed: Procedure(s) (LRB): IRRIGATION BREAST POCKET AND EXCHANGE OF LEFT BREAST TISSUE EXPANDER (Left) TISSUE EXPANDER (Left)  Patient Location: PACU  Anesthesia Type: General  Level of Consciousness: awake and alert   Airway and Oxygen Therapy: Patient Spontanous Breathing  Post-op Pain: mild  Post-op Assessment: Post-op Vital signs reviewed, Patient's Cardiovascular Status Stable, Respiratory Function Stable, Patent Airway and No signs of Nausea or vomiting  Last Vitals:  Filed Vitals:   03/02/15 1600  BP: 108/83  Pulse: 75  Temp:   Resp: 20    Post-op Vital Signs: stable   Complications: No apparent anesthesia complications

## 2015-03-02 NOTE — Op Note (Signed)
Operative Note   DATE OF OPERATION: 9.14.2016  LOCATION: [facility]- observation  SURGICAL DIVISION: Plastic Surgery  PREOPERATIVE DIAGNOSES: 1. Left breast cancer 2. Acquired absence bilateral breasts 3. 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 Cellulitis left reconstructed breast  POSTOPERATIVE DIAGNOSES: same  PROCEDURE: 1. Removal, replacement of left breast tissue expander  SURGEON: Irene Limbo MD MBA  ASSISTANT: none  ANESTHESIA: General.   EBL: 21 ml  COMPLICATIONS: None immediate.   INDICATIONS FOR PROCEDURE:  The patient, Natasha Torres, is a 34 y.o. female born on 1980/08/02, is here for irrigation breast pocket and exchange of left breast tissue expander for erythema. She has undergone similar procedure for right reconstruction which has resolved.   FINDINGS: Straw colored fluid in cavity. Acellular dermis was adherent, but given concern for infection, removed. [place] Artoura Ultra High profile 455 ml tissue expander placed, Initial fill volume 250 ml. Ref [id number] SN [id number]  DESCRIPTION OF PROCEDURE:  The patient's operative site was marked with the patient in the preoperative area. The patient was taken to the operating room. SCDs were placed and IV antibiotics administered. The patient's preexisting drain was removed. The patient's operative site was prepped and draped in a sterile fashion. A time out was performed and all information was confirmed to be correct. Incision made through prior inframammary incision. Skin flaps elevated off underlying muscle and fascial layer. On entering cavity small amount straw colored fluid drained. Expander removed. Examination of cavity revealed no other concerns. The acellular demis was adherent to mastectomy flaps, but given the cellulitis and concern for contamination, I elected to remove the acellular dermis. Cavity treated by curettage and irrigated with solution containing polymyxin and bacitracin. Additional  irrigation with saline completed. Cavity inspected for hemostasis and new 15 Fr JP drain placed and secured with 2-0 nylon. New tissue expander prepared and placed in cavity. The expander was secured to chest wall with 3-0 vicryl. Drain placed beneath mastectomy flap and above expander. Closure completed with 3-0 vicryl interrupted in superficial fascial layer, 4-0 vicryl in dermis and 4-0 nylon for skin closure. Tissue adhesive appleid followed by steri strips, dry dressing and binder.  Right breast port accessed and filled to 250 ml.   The patient was allowed to wake from anesthesia, extubated and taken to the recovery room in satisfactory condition.   SPECIMENS: none  DRAINS: 15 Fr JP in left reconstructed breast  Irene Limbo, MD [facility] Plastic & Reconstructive Surgery [phone]

## 2015-03-02 NOTE — Progress Notes (Signed)
Dr. Jillyn Hidden called and is aware of abnormal labs collected 3 days ago, no need to redraw labs per him.

## 2015-03-02 NOTE — Anesthesia Procedure Notes (Signed)
Procedure Name: Intubation Date/Time: 03/02/2015 1:34 PM Performed by: Neldon Newport Pre-anesthesia Checklist: Patient being monitored, Suction available, Emergency Drugs available, Patient identified and Timeout performed Patient Re-evaluated:Patient Re-evaluated prior to inductionOxygen Delivery Method: Circle system utilized Preoxygenation: Pre-oxygenation with 100% oxygen Intubation Type: IV induction Ventilation: Mask ventilation without difficulty Laryngoscope Size: Mac and 3 Grade View: Grade I Tube type: Oral Tube size: 7.0 mm Number of attempts: 1 Placement Confirmation: positive ETCO2,  ETT inserted through vocal cords under direct vision and breath sounds checked- equal and bilateral Secured at: 22 cm Tube secured with: Tape Dental Injury: Teeth and Oropharynx as per pre-operative assessment

## 2015-03-02 NOTE — Anesthesia Preprocedure Evaluation (Addendum)
Anesthesia Evaluation  Patient identified by MRN, date of birth, ID band Patient awake    Reviewed: Allergy & Precautions, H&P , NPO status , Patient's Chart, lab work & pertinent test results, reviewed documented beta blocker date and time   Airway Mallampati: I  TM Distance: >3 FB     Dental no notable dental hx. (+) Teeth Intact, Dental Advisory Given   Pulmonary neg pulmonary ROS,    Pulmonary exam normal breath sounds clear to auscultation       Cardiovascular  Rhythm:Regular Rate:Normal  Non ischemic cardiomyopathy EF45%   Neuro/Psych PSYCHIATRIC DISORDERS Anxiety Depression negative neurological ROS     GI/Hepatic Neg liver ROS, GERD  Controlled,  Endo/Other  negative endocrine ROS  Renal/GU negative Renal ROS  negative genitourinary   Musculoskeletal   Abdominal   Peds  Hematology negative hematology ROS (+)   Anesthesia Other Findings   Reproductive/Obstetrics negative OB ROS                            Anesthesia Physical  Anesthesia Plan  ASA: II  Anesthesia Plan: General   Post-op Pain Management:    Induction: Intravenous  Airway Management Planned: LMA  Additional Equipment:   Intra-op Plan:   Post-operative Plan: Extubation in OR  Informed Consent: I have reviewed the patients History and Physical, chart, labs and discussed the procedure including the risks, benefits and alternatives for the proposed anesthesia with the patient or authorized representative who has indicated his/her understanding and acceptance.   Dental advisory given and Dental Advisory Given  Plan Discussed with: CRNA, Anesthesiologist and Surgeon  Anesthesia Plan Comments:        Anesthesia Quick Evaluation

## 2015-03-03 ENCOUNTER — Encounter (HOSPITAL_COMMUNITY): Payer: Self-pay | Admitting: Plastic Surgery

## 2015-03-03 DIAGNOSIS — T8579XA Infection and inflammatory reaction due to other internal prosthetic devices, implants and grafts, initial encounter: Secondary | ICD-10-CM | POA: Diagnosis not present

## 2015-03-03 LAB — WOUND CULTURE

## 2015-03-03 MED ORDER — KETOROLAC TROMETHAMINE 30 MG/ML IJ SOLN
30.0000 mg | Freq: Once | INTRAMUSCULAR | Status: AC
  Administered 2015-03-03: 30 mg via INTRAVENOUS
  Filled 2015-03-03: qty 1

## 2015-03-03 MED ORDER — OXYCODONE HCL 5 MG PO TABS: 5.0000 mg | ORAL_TABLET | ORAL | Status: DC | PRN

## 2015-03-03 NOTE — Progress Notes (Signed)
Patient ID: Natasha Torres, female   DOB: 07/18/1980, 34 y.o.   MRN: [id number] Pt discharged home with family. Discharge instructions provided. All questions answered. Pt verbalized understanding. Pt refused education regarding emptying of JP drains. Stated that she was" familiar with how to empty".  Pt escorted off unit in w/c with personal belonging by volunteer transporter.

## 2015-03-03 NOTE — Discharge Summary (Signed)
Physician Discharge Summary  Patient ID: Natasha Torres MRN: [id number] DOB/AGE: 07/03/1980 34 y.o.  Admit date: 03/02/2015 Discharge date: 03/03/2015  Admission Diagnoses: Left breast cellulitis, acquired absence breasts, history left breast cancer, genetic predisposition to breast cancer  Discharge Diagnoses:  Active Problems:   Acquired absence of breast and nipple   Discharged Condition: stable  Hospital Course: Represented for left breast cellulitis after similar concerns right breast 3 days prior. She underwent removal/replacement left side expander and removal acelluar dermis used in breast reconstruction following bilateral mastectomies. Post operatively stayed overnight for pain control. Afebrile and cellulitis essentially resolved bilateral. She was maintained on IV antibiotics during hospital stay and resume PO Cipro on discharge  Treatments: surgery: removal, replacement left breast tissue expander 03/02/15  Discharge Exam: Blood pressure 103/47, pulse 79, temperature 98.3 F (36.8 C), temperature source Oral, resp. rate 18, height 5\' 11"  (1.803 m), weight 87.8 kg (193 lb 9 oz), SpO2 99 %. Incision/Wound: dressings dry, incisions intact, chest soft with resolved cellulitis, drains serosanguinous  Disposition: 01-Home or Self Care  Discharge Instructions    Call MD for:  redness, tenderness, or signs of infection (pain, swelling, bleeding, redness, odor or green/yellow discharge around incision site)    Complete by:  As directed      Call MD for:  severe or increased pain, loss or decreased feeling  in affected limb(s)    Complete by:  As directed      Call MD for:  temperature >100.5    Complete by:  As directed      Discharge instructions    Complete by:  As directed   Strip and record drains twice daily and bring log to clinic visit. Breast binder all times except shower. Ok to remove dressings and shower am 03/04/15. Pat incisions dry. Dry dressing as desired.     Driving Restrictions    Complete by:  As directed   No driving while taking narcotics     Lifting restrictions    Complete by:  As directed   No lifting greater than 5 lbs     Resume previous diet    Complete by:  As directed             Medication List    STOP taking these medications        lidocaine-prilocaine cream  Commonly known as:  EMLA      TAKE these medications        BIOTIN PO  Take 2 tablets by mouth daily.     candesartan 4 MG tablet  Commonly known as:  ATACAND  Take 1 tablet (4 mg total) by mouth every evening.     carvedilol 6.25 MG tablet  Commonly known as:  COREG  Take 1 tablet (6.25 mg total) by mouth 2 (two) times daily with a meal.     ciprofloxacin 500 MG tablet  Commonly known as:  CIPRO  Take 1 tablet (500 mg total) by mouth 2 (two) times daily.     docusate sodium 100 MG capsule  Commonly known as:  COLACE  Take 100 mg by mouth daily as needed for mild constipation.     ibuprofen 200 MG tablet  Commonly known as:  ADVIL,MOTRIN  Take 200-400 mg by mouth every 6 (six) hours as needed for headache, mild pain or moderate pain.     loperamide 2 MG capsule  Commonly known as:  IMODIUM  Take 2 mg by mouth as needed for diarrhea or  loose stools.     LORazepam 0.5 MG tablet  Commonly known as:  ATIVAN  Take 1 tablet (0.5 mg total) by mouth every 8 (eight) hours as needed.     Melatonin 10 MG Caps  Take 10 mg by mouth.     multivitamin tablet  Take 2 tablets by mouth daily.     ondansetron 4 MG tablet  Commonly known as:  ZOFRAN  Take 4 mg by mouth every 8 (eight) hours as needed for nausea or vomiting.     oxyCODONE 5 MG immediate release tablet  Commonly known as:  Oxy IR/ROXICODONE  Take 1-2 tablets (5-10 mg total) by mouth every 4 (four) hours as needed for moderate pain.     oxyCODONE 5 MG immediate release tablet  Commonly known as:  Oxy IR/ROXICODONE  Take 1-2 tablets (5-10 mg total) by mouth every 4 (four) hours as needed  for moderate pain.     PROBIOTIC DAILY PO  Take 1 capsule by mouth daily. Ultimate Flora     prochlorperazine 10 MG tablet  Commonly known as:  COMPAZINE  Take 1 tablet (10 mg total) by mouth every 6 (six) hours as needed (Nausea or vomiting).     tamoxifen 20 MG tablet  Commonly known as:  NOLVADEX  Take 1 tablet (20 mg total) by mouth daily.     UNABLE TO FIND  1 each by Other route daily. 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     zolpidem 5 MG tablet  Commonly known as:  AMBIEN  Take 5 mg by mouth at bedtime as needed for sleep.           Follow-up Information    Follow up with Irene Limbo, MD On 03/07/2015.   Specialty:  Plastic Surgery   Why:  930am   Contact information:   [place] [address] [phone]       Signed: Irene Limbo 03/03/2015, 8:27 AM

## 2015-03-05 LAB — ANAEROBIC CULTURE

## 2015-03-07 ENCOUNTER — Encounter: Payer: Self-pay | Admitting: Physical Therapy

## 2015-03-09 NOTE — Progress Notes (Signed)
Counseling Intern session note:  Intake session with patient.  Patient and counselor discussed presenting concerns related to interpersonal relationships as well as health concerns.  Client and counselor processed and discussed identity shifts, available resources, and coping mechanisms.  Follow up appointment scheduled for 03/18/15

## 2015-03-10 ENCOUNTER — Encounter: Payer: Self-pay | Admitting: Physical Therapy

## 2015-03-14 ENCOUNTER — Other Ambulatory Visit: Payer: Self-pay | Admitting: Hematology and Oncology

## 2015-03-14 ENCOUNTER — Ambulatory Visit (HOSPITAL_COMMUNITY)
Admission: RE | Admit: 2015-03-14 | Discharge: 2015-03-14 | Disposition: A | Discharge status: Home / Self Care | Payer: Managed Care, Other (non HMO) | Source: Ambulatory Visit | Attending: General Surgery | Admitting: General Surgery

## 2015-03-14 ENCOUNTER — Ambulatory Visit (HOSPITAL_BASED_OUTPATIENT_CLINIC_OR_DEPARTMENT_OTHER)
Admission: RE | Admit: 2015-03-14 | Discharge: 2015-03-14 | Disposition: A | Discharge status: Home / Self Care | Payer: Managed Care, Other (non HMO) | Source: Ambulatory Visit | Attending: Internal Medicine | Admitting: Internal Medicine

## 2015-03-14 ENCOUNTER — Encounter: Payer: Self-pay | Admitting: Physical Therapy

## 2015-03-14 ENCOUNTER — Ambulatory Visit: Payer: Managed Care, Other (non HMO) | Attending: General Surgery | Admitting: Physical Therapy

## 2015-03-14 VITALS — BP 98/64 | HR 76 | Wt 178.4 lb

## 2015-03-14 DIAGNOSIS — I428 Other cardiomyopathies: Secondary | ICD-10-CM

## 2015-03-14 DIAGNOSIS — C50412 Malignant neoplasm of upper-outer quadrant of left female breast: Secondary | ICD-10-CM

## 2015-03-14 DIAGNOSIS — I313 Pericardial effusion (noninflammatory): Secondary | ICD-10-CM | POA: Diagnosis not present

## 2015-03-14 DIAGNOSIS — I429 Cardiomyopathy, unspecified: Secondary | ICD-10-CM

## 2015-03-14 DIAGNOSIS — M25611 Stiffness of right shoulder, not elsewhere classified: Secondary | ICD-10-CM | POA: Diagnosis present

## 2015-03-14 DIAGNOSIS — M25612 Stiffness of left shoulder, not elsewhere classified: Secondary | ICD-10-CM | POA: Diagnosis present

## 2015-03-14 DIAGNOSIS — Z9189 Other specified personal risk factors, not elsewhere classified: Secondary | ICD-10-CM | POA: Diagnosis present

## 2015-03-14 DIAGNOSIS — Z9013 Acquired absence of bilateral breasts and nipples: Secondary | ICD-10-CM

## 2015-03-14 NOTE — Therapy (Signed)
[facility] [address] [place], [place], [address] Phone: [phone]   Fax:  [phone]  Physical Therapy Evaluation  Patient Details  Name: Natasha Torres MRN: [id number] Date of Birth: 03/10/1981 Referring Provider:  Wakefield, Matthew, MD  Encounter Date: 03/14/2015      PT End of Session - 03/14/15 1706    Visit Number 1   Number of Visits 24   Date for PT Re-Evaluation 05/09/15   PT Start Time 1340   PT Stop Time 1430   PT Time Calculation (min) 50 min   Activity Tolerance Patient tolerated treatment well   Behavior During Therapy WFL for tasks assessed/performed      Past Medical History  Diagnosis Date  . GERD (gastroesophageal reflux disease)     notices with chemo but not a chronic problem  . Nocturia   . Anxiety     takes Lorazepam daily if needed  . Insomnia     takes Ambien nightly if needed  . Depression     hx  . 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 Family history of adverse reaction to anesthesia     mom gets sick  . Wears glasses     Past Surgical History  Procedure Laterality Date  . Right oophorectomy  2012  . Wisdom tooth extraction  2001  . Orif finger / thumb fracture Right 1994    thumb  . Portacath placement  08/2014; 01/07/2015  . Complete mastectomy w/ sentinel node biopsy Left 02/10/2015  . Mastectomy Bilateral 02/10/2015  . Breast reconstruction with placement of tissue expander and flex hd (acellular hydrated dermis) Bilateral 02/10/2015  . Port-a-cath removal  01/07/2015  . Breast lumpectomy Left 08/2014  . Breast biopsy Bilateral 09/2014  . Nipple sparing mastectomy/sentinal lymph node biopsy/reconstruction/placement of tissue expander Bilateral 02/10/2015    Procedure: BILATERAL  NIPPLE SPARING MASTECTOMY WITH LEFT  SENTINAL LYMPH NODE BIOPSY(RIGHT BREAST PROPHYLACTIC);   Surgeon: Matthew Wakefield, MD;  Location: [facility] OR;  Service: General;  Laterality: Bilateral;  . Breast reconstruction with placement of tissue expander and flex hd (acellular hydrated dermis) Bilateral 02/10/2015    Procedure: BREAST RECONSTRUCTION WITH PLACEMENT OF TISSUE EXPANDER AND FLEX HD (ACELLULAR HYDRATED DERMIS);  Surgeon: Brinda Thimmappa, MD;  Location: [facility] OR;  Service: Plastics;  Laterality: Bilateral;  . Removal of tissue expander and placement of implant Right 02/27/2015    Procedure: REMOVAL OF TISSUE EXPANDER AND PLACEMENT OF NEW TISSUE EXPANDER;  Surgeon: Brinda Thimmappa, MD;  Location: [facility] OR;  Service: Plastics;  Laterality: Right;  . Incision and drainage of wound Left 03/02/2015    Procedure: IRRIGATION BREAST POCKET AND EXCHANGE OF LEFT BREAST TISSUE EXPANDER;  Surgeon: Brinda Thimmappa, MD;  Location: [facility] OR;  Service: Plastics;  Laterality: Left;  . Tissue expander placement Left 03/02/2015    Procedure: TISSUE EXPANDER;  Surgeon: Brinda Thimmappa, MD;  Location: [facility] OR;  Service: Plastics;  Laterality: Left;    There were no vitals filed for this visit.  Visit Diagnosis:  Shoulder stiffness, left - Plan: PT plan of care cert/re-cert  Shoulder stiffness, right - Plan: PT plan of care cert/re-cert  Status post mastectomy, bilateral - Plan: PT plan of care cert/re-cert  At risk for lymphedema - Plan: PT plan of care cert/re-cert      Subjective Assessment - 03/14/15 1346    Subjective Patient   had a bilateral mastectomy due to left breast cancer with left sentinel node biopsy on 02/10/15.  She was positiv for the ATM mutation and decided on a bilateral mastectomy for profilactic reasons. Three nodes removed and were negative. She underwent neoadjuvant chemotherapy from about 09/22/14 - 01/10/15.  Radiation is not needed.  She had immediate reconstruction with bilateral expanders and both became infected and were removed and replaced (right on 02/27/15 and left on 03/02/15).  Drains  were removed today 03/14/15.  Her plastic surgeon requested she "take it easy" in therapy this week and she still has a 5# weight lifting restriction. She is on Herceptin and recently had an echocardiogram.  It showed that her ejection fraction had reduced to 45 but is asymptomatic.  She reports some tightness in her arms since surgery and some soreness.   Pertinent History Patient had a bilateral mastectomy due to left breast cancer with left sentinel node biopsy on 02/10/15.  She was positiv for the ATM mutation and decided on a bilateral mastectomy for profilactic reasons. Three nodes removed and were negative. She underwent neoadjuvant chemotherapy from about 09/22/14 - 01/10/15.  Radiation is not needed.  She had immediate reconstruction with bilateral expanders and both became infected and were removed and replaced (right on 02/27/15 and left on 03/02/15).  Drains were removed today 03/14/15.  Her plastic surgeon requested she "take it easy" in therapy this week and she still has a 5# weight lifting restriction. She is on Herceptin and recently had an echocardiogram.  It showed that her ejection fraction had reduced to 45 but is asymptomatic.  She reports some tightness in her arms since surgery and some soreness   Patient Stated Goals Use arms normally, reduce lymphedema risk.   Currently in Pain? Yes   Pain Score 2    Pain Location Chest   Pain Orientation Anterior   Pain Descriptors / Indicators Sore   Pain Type Surgical pain   Pain Onset More than a month ago   Pain Frequency Constant   Aggravating Factors  Stretching   Pain Relieving Factors Rest   Multiple Pain Sites No            [facility] PT Assessment - 03/14/15 0001    Assessment   Medical Diagnosis Left breast cancer; s/p bilateral mastectomy   Onset Date/Surgical Date 02/10/15   Hand Dominance Right   Next MD Visit 03/24/15   Prior Therapy none   Precautions   Precautions Other (comment)  recent surgery; left arm lymphedema risk    Restrictions   Weight Bearing Restrictions No   Balance Screen   Has the patient fallen in the past 6 months No   Has the patient had a decrease in activity level because of a fear of falling?  No   Is the patient reluctant to leave their home because of a fear of falling?  No   Home Environment   Living Environment Private residence   Living Arrangements Children  3 y.o. daughter and her Mom temporarily   Available Help at Discharge Family   Prior Function   Level of Independence Independent   Vocation Full time employment  Currently not working   Vocation Requirements Speech therapist   Leisure Was doing classes at gym, yoga, cardio; was doing 1 hour 3-4x/wk   Cognition   Overall Cognitive Status Within Functional Limits for tasks assessed   Posture/Postural Control   Posture/Postural Control Postural limitations   Postural Limitations Rounded Shoulders;Forward head     ROM / Strength   AROM / PROM / Strength AROM;Strength   AROM   AROM Assessment Site Shoulder   Right/Left Shoulder Right;Left   Right Shoulder Extension 58 Degrees   Right Shoulder Flexion 141 Degrees   Right Shoulder ABduction 147 Degrees   Right Shoulder Internal Rotation 57 Degrees   Right Shoulder External Rotation 90 Degrees   Left Shoulder Extension 51 Degrees   Left Shoulder Flexion 141 Degrees   Left Shoulder ABduction 154 Degrees   Left Shoulder Internal Rotation 60 Degrees   Left Shoulder External Rotation 88 Degrees   Strength   Overall Strength --  Not tested due to recent surgery           LYMPHEDEMA/ONCOLOGY QUESTIONNAIRE - 03/14/15 1405    Type   Cancer Type Left breast cancer; s/p bilateral mastectomies with reconstruction   Surgeries   Mastectomy Date 02/10/15   Saline Implant Reconstruction Date 02/10/15   Sentinel Lymph Node Biopsy Date 02/10/15   Number Lymph Nodes Removed 3   Treatment   Active Chemotherapy Treatment No   Past Chemotherapy Treatment Yes   Date 01/10/15    Active Radiation Treatment No   Past Radiation Treatment No   Current Hormone Treatment No   Past Hormone Therapy No   What other symptoms do you have   Are you Having Heaviness or Tightness No   Are you having Pain Yes   Are you having pitting edema No   Is it Hard or Difficult finding clothes that fit No   Do you have infections Yes   Comments Had implant infections   Is there Decreased scar mobility No   Stemmer Sign No   Lymphedema Assessments   Lymphedema Assessments Upper extremities   Right Upper Extremity Lymphedema   10 cm Proximal to Olecranon Process 30.3 cm   Olecranon Process 25.1 cm   10 cm Proximal to Ulnar Styloid Process 20.8 cm   Just Proximal to Ulnar Styloid Process 15.6 cm   Across Hand at Thumb Web Space 19 cm   At Base of 2nd Digit 6 cm   Left Upper Extremity Lymphedema   10 cm Proximal to Olecranon Process 29.8 cm   Olecranon Process 24.4 cm   10 cm Proximal to Ulnar Styloid Process 20.5 cm   Just Proximal to Ulnar Styloid Process 15.7 cm   Across Hand at Thumb Web Space 18.8 cm   At Base of 2nd Digit 6 cm           Quick Dash - 03/14/15 0001    Open a tight or new jar Mild difficulty   Do heavy household chores (wash walls, wash floors) Mild difficulty   Carry a shopping bag or briefcase No difficulty   Wash your back Mild difficulty   Use a knife to cut food No difficulty   Recreational activities in which you take some force or impact through your arm, shoulder, or hand (golf, hammering, tennis) Mild difficulty   During the past week, to what extent has your arm, shoulder or hand problem interfered with your normal social activities with family, friends, neighbors, or groups? Slightly   During the past week, to what extent has your arm, shoulder or hand problem limited your work or other regular daily activities Slightly   Arm, shoulder, or hand pain. Mild   Tingling (pins and needles) in your arm, shoulder, or hand None   Difficulty Sleeping  No difficulty   DASH Score 15.91 %                          Short Term Clinic Goals - 03/14/15 1713    CC Short Term Goal  #1   Title Patient will be able to perform initial home exercise  program to increase shoulder mobility.   Time 4   Period Weeks   Status New   CC Short Term Goal  #2   Title Patient will be able to increase bilateral shoulder flexion to >/= 155 degrees for increased ease reaching overhead   Time 4   Period Weeks   Status New   CC Short Term Goal  #3   Title Patient will be able to increase bilateral shoulder abduction to >/= 155 degrees for increased ease reaching overhead.   Time 4   Period Weeks   Status New   CC Short Term Goal  #4   Title Patient will be able to verbalize understanding of risk reduction practices for lymphedema.   Time 4   Period Weeks   Status New             Long Term Clinic Goals - 03/14/15 1717    CC Long Term Goal  #1   Title Patient will be able to increase bilateral shoulder flexion to >/= 160 degrees for increased ease reaching overhead   Time 8   Period Weeks   Status New   CC Long Term Goal  #2   Title Patient will be able to increase bilateral shoulder abduction to >/= 160 degrees for increased ease reaching overhead   Time 8   Period Weeks   Status New   CC Long Term Goal  #3   Title Patient will be able to verbalize and demonstrate understanding of a safe self-progression of her home exercise program   Time 8   Period Weeks   Status New   CC Long Term Goal  #4   Title Patient will be able to return to >/= 75% of her normal household tasks   Time 8   Period Weeks   Status New            Plan - 03/14/15 1707    Clinical Impression Statement Patient is a very pleasant 34 year old woman who has been through quite a lot with her left breast cancer.  She is now recovering well from bilateral mastectomies and reconstruction.  She will benefit from PT to regain shoulder ROM and strength and  reduce her risk of lymphedema.   Pt will benefit from skilled therapeutic intervention in order to improve on the following deficits Decreased strength;Impaired UE functional use;Pain;Decreased range of motion;Decreased knowledge of precautions   Rehab Potential Excellent   Clinical Impairments Affecting Rehab Potential Recent infections in breasts from reconstruction   PT Frequency 2x / week   PT Duration 8 weeks   PT Treatment/Interventions Therapeutic exercise;Manual techniques;Patient/family education;Passive range of motion;ADLs/Self Care Home Management   PT Next Visit Plan PROM, ROM exercises and begin HEP; pt to attend ABC class on 03/21/15   Consulted and Agree with Plan of Care Patient         Problem List Patient Active Problem List   Diagnosis Date Noted  . Acquired absence of breast and nipple 02/27/2015  . Breast cancer, left breast 02/10/2015  . S/P bilateral mastectomy 02/10/2015  . Nonischemic cardiomyopathy 01/18/2015  . Esophagitis 10/25/2014  . Pharyngitis 10/15/2014  . Constipation 10/15/2014  . 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 Breast cancer of upper-outer quadrant of left female breast 09/14/2014  .  SVD (spontaneous vaginal delivery) 12/03/2011    Annia Friendly, PT 03/14/2015 5:20 PM   [place], [place], [address] Phone: [phone]   Fax:  [phone]

## 2015-03-14 NOTE — Patient Instructions (Signed)
Continue current medications  Back in 2 months with an echo  Echocardiogram An echocardiogram, or echocardiography, uses sound waves (ultrasound) to produce an image of your heart. The echocardiogram is simple, painless, obtained within a short period of time, and offers valuable information to your health care provider. The images from an echocardiogram can provide information such as:  Evidence of coronary artery disease (CAD).  Heart size.  Heart muscle function.  Heart valve function.  Aneurysm detection.  Evidence of a past heart attack.  Fluid buildup around the heart.  Heart muscle thickening.  Assess heart valve function. LET [facility] CARE PROVIDER KNOW ABOUT:  Any allergies you have.  All medicines you are taking, including vitamins, herbs, eye drops, creams, and over-the-counter medicines.  Previous problems you or members of your family have had with the use of anesthetics.  Any blood disorders you have.  Previous surgeries you have had.  Medical conditions you have.  Possibility of pregnancy, if this applies. BEFORE THE PROCEDURE  No special preparation is needed. Eat and drink normally.  PROCEDURE   In order to produce an image of your heart, gel will be applied to your chest and a wand-like tool (transducer) will be moved over your chest. The gel will help transmit the sound waves from the transducer. The sound waves will harmlessly bounce off your heart to allow the heart images to be captured in real-time motion. These images will then be recorded.  You may need an IV to receive a medicine that improves the quality of the pictures. AFTER THE PROCEDURE You may return to your normal schedule including diet, activities, and medicines, unless your health care provider tells you otherwise. Document Released: 06/01/2000 Document Revised: 10/19/2013 Document Reviewed: 02/09/2013 [facility] Patient Information 2015 [place], [place]. This information is not  intended to replace advice given to you by your health care provider. Make sure you discuss any questions you have with your health care provider.

## 2015-03-14 NOTE — Progress Notes (Signed)
Advanced Heart Failure Clinic Note   Patient ID: Natasha Torres, female   DOB: [birth year], 34 y.o.   MRN: [id number] Primary oncologist: Dr Lindi Adie HF: Dr Aundra Dubin.  34 yo with left breast cancer presents for evaluation of fall in EF and strain by echo monitoring.  Breast cancer was diagnosed in 2/16.  ER+/PR-/HER2+.  Treated with neoadjuvant chemo (carboplatin, docetaxel, Herceptin, and pertuzumab).  She completed this treatment and is now continuing on Herceptin alone to complete 1 year of treatment. She is s/p mastectomy.  She had an echo in 4/16 showed EF 55-60% with GLS -25%.  However, repeat echo in 6/16 showed fall in EF to 50-55% with GLS fallen to -17%.  Echo at last appointment in 8/16 showed EF down to 45%.  At that time, Herceptin was held for 6 weeks and echo was repeated today.  Today's echo shows EF in the 50-55% range again.    She had short admission on 02/27/15 for debridement of R breast tissue expander after 24 hours of cloudy drainage from right breast, erythema. She also had replacement of L breast tissue expander on 03/02/15 with straw colored fluid in cavity.  She returns today for Cardio-oncology follow up.  Feels overall ok, apart from occasional dizziness with standing and fatigue with prolonged standing.  Had drains removed from tissue expander surgeries as above this am.  No SOB, CP, orthopnea, bendopnea, PND, or near syncope.  She has no history of cardiac problems.  No family history of cardiomyopathy or early coronary disease.   Labs (7/16): K 4, creatinine 0.7 Labs (8/16): K 4.3, creatinine 0.64 Labs (9/16): K 3.7, creatinine 0.81  PMH: 1. Breast cancer: On left, diagnosed 2/16.  ER+/PR-/HER2+.  Treated with neoadjuvant chemo (carboplatin, docetaxel, Herceptin, and pertuzumab).  She completed this treatment and is now continuing on Herceptin alone to complete 1 year of treatment.  S/p mastectomy.  2. 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  - Echo (4/16)  with EF 55-60%, GLS -25%. - Echo (6/16) with EF 50-55%, GLS -17% - Echo (8/16) with EF 45%, GLS -18.6%, lateral s' 13.5 cm/sec - Echo (9/16) with EF 50-55%, no strain done, lateral s' 13.1 cm/sec  Social History   Social History  . Marital Status: Married    Spouse Name: Harrell Gave  . Number of Children: 1  . Years of Education: N/A   Social History Main Topics  . Smoking status: Never Smoker   . Smokeless tobacco: Never Used  . Alcohol Use: 4.8 oz/week    8 Glasses of wine, 0 Standard drinks or equivalent per week  . Drug Use: No  . Sexual Activity: Not Currently   Other Topics Concern  . Not on file   Social History Narrative   ** Merged History Encounter **       Family History  Problem Relation Age of Onset  . Hypertension Mother   . Depression Maternal Aunt   . Cancer Maternal Grandfather   . Cancer Paternal Grandfather   . Prostate cancer Father 18  . Dementia Maternal Grandmother   . Heart attack Maternal Grandfather   . Dementia Paternal Grandmother   . Kidney cancer Paternal Grandmother     slow growing, no treatment  . Prostate cancer Paternal Grandfather 18  . Bone cancer Paternal Grandfather 57  . Breast cancer Paternal Grandfather 42  . Lung cancer Paternal Grandfather     dx late 11s; smoker.  thought to be a 4th primary cancer  . Lung cancer Other  smoker  . Prostate cancer Other     MGMs 1/2 brother   ROS: All systems reviewed and negative except as per HPI.  Current Outpatient Prescriptions  Medication Sig Dispense Refill  . BIOTIN PO Take 2 tablets by mouth daily.    . candesartan (ATACAND) 4 MG tablet Take 1 tablet (4 mg total) by mouth every evening. 30 tablet 3  . carvedilol (COREG) 6.25 MG tablet Take 1 tablet (6.25 mg total) by mouth 2 (two) times daily with a meal. 60 tablet 3  . docusate sodium (COLACE) 100 MG capsule Take 100 mg by mouth daily as needed for mild constipation.    [name] ibuprofen (ADVIL,MOTRIN) 200 MG tablet Take  200-400 mg by mouth every 6 (six) hours as needed for headache, mild pain or moderate pain.    [name] loperamide (IMODIUM) 2 MG capsule Take 2 mg by mouth as needed for diarrhea or loose stools.    [name] LORazepam (ATIVAN) 0.5 MG tablet TAKE 1 TABLET BY MOUTH EVERY 8 HOURS AS NEEDED 60 tablet 0  . Melatonin 10 MG CAPS Take 10 mg by mouth.    . Multiple Vitamin (MULTIVITAMIN) tablet Take 2 tablets by mouth daily.     . ondansetron (ZOFRAN) 4 MG tablet Take 4 mg by mouth every 8 (eight) hours as needed for nausea or vomiting.    [name] oxyCODONE (OXY IR/ROXICODONE) 5 MG immediate release tablet Take 1-2 tablets (5-10 mg total) by mouth every 4 (four) hours as needed for moderate pain. 50 tablet 0  . Probiotic Product (PROBIOTIC DAILY PO) Take 1 capsule by mouth daily. Ultimate Flora    . prochlorperazine (COMPAZINE) 10 MG tablet Take 1 tablet (10 mg total) by mouth every 6 (six) hours as needed (Nausea or vomiting). 30 tablet 1  . venlafaxine XR (EFFEXOR-XR) 37.5 MG 24 hr capsule Take 1 capsule (37.5 mg total) by mouth daily with breakfast. 30 capsule 6  . zolpidem (AMBIEN) 5 MG tablet Take 5 mg by mouth at bedtime as needed for sleep.    . tamoxifen (NOLVADEX) 20 MG tablet Take 1 tablet (20 mg total) by mouth daily. (Patient not taking: Reported on 03/14/2015) 90 tablet 3   No current facility-administered medications for this encounter.   BP 98/64 mmHg  Pulse 76  Wt 178 lb 6.4 oz (80.922 kg)  SpO2 100%   General: NAD Neck: No JVD, no thyromegaly or thyroid nodule.  Lungs: Clear to auscultation bilaterally with normal respiratory effort. CV: Nondisplaced PMI.  Heart regular S1/S2, no S3/S4, no murmur.  No peripheral edema.  No carotid bruit.  Normal pedal pulses.  Abdomen: Soft, nontender, no hepatosplenomegaly, no distention.  Skin: Intact without lesions or rashes.  Neurologic: Alert and oriented x 3.  Psych: Normal affect. Extremities: No clubbing or cyanosis.  HEENT: Normal.   Assessment/plan: 34  yo with history of breast cancer getting Herceptin to complete 1 year of therapy.   - Echo in 6/16 showed a fall in EF from 55-60% to 50-55%.  There was also a significant fall in global lateral strain, portending the possibility of more profound fall in EF over time.  This may be related to Herceptin.  Echo 02/03/15 showed EF 45%, strain stable compared to 6/16.  Herceptin was held for 6 wks.  Echo today showed EF up to 50-55%.  - OK to start back on Herceptin with close followup. - Continue Coreg 6.25 mg bid, continue candesartan. No room to go up with soft BP.  -  Follow up 2 months with Echo  Satira Mccallum Tillery PA-C 03/14/2015   Patient seen with PA, agree with the above note.  I reviewed her echo from today, EF 50-55%, up from 45%.  Strain not done on today's echo.  She can restart Herceptin with close followup.  Will repeat echo in 2 months.  She will continue Coreg and candesartan.   Loralie Champagne 03/14/2015

## 2015-03-14 NOTE — Progress Notes (Signed)
  Echocardiogram 2D Echocardiogram has been performed.  Donata Clay 03/14/2015, 12:04 PM

## 2015-03-17 ENCOUNTER — Ambulatory Visit: Payer: Managed Care, Other (non HMO)

## 2015-03-17 DIAGNOSIS — M25611 Stiffness of right shoulder, not elsewhere classified: Secondary | ICD-10-CM

## 2015-03-17 DIAGNOSIS — M25612 Stiffness of left shoulder, not elsewhere classified: Secondary | ICD-10-CM | POA: Diagnosis not present

## 2015-03-17 DIAGNOSIS — Z9189 Other specified personal risk factors, not elsewhere classified: Secondary | ICD-10-CM

## 2015-03-17 DIAGNOSIS — Z9013 Acquired absence of bilateral breasts and nipples: Secondary | ICD-10-CM

## 2015-03-17 NOTE — Patient Instructions (Signed)
  Cane Exercise: Flexion   Lie on back, holding cane above chest. Keeping arms as straight as possible, lower cane toward floor beyond head. Hold __5__ seconds. Repeat _5-10___ times. Do _2___ sessions per day.       Can also do in standing.  http://gt2.exer.us/91   Copyright  VHI. All rights reserved.   Cane Exercise: Abduction   Hold cane with right hand over end, palm-up, with other hand palm-down. Move arm out from side and up by pushing with other arm. Hold __5__ seconds. Repeat _5-10___ times. Do __2__ sessions per day.  http://gt2.exer.us/82   Copyright  VHI. All rights reserved.

## 2015-03-17 NOTE — Therapy (Signed)
[place], [place], [address] Phone: [phone]   Fax:  [phone]  Physical Therapy Treatment  Patient Details  Name: Natasha Torres MRN: [id number] Date of Birth: Apr 06, 1981 Referring Provider:  Rolm Bookbinder, MD  Encounter Date: 03/17/2015      PT End of Session - 03/17/15 1106    Visit Number 2   Number of Visits 24   Date for PT Re-Evaluation 05/09/15   PT Start Time 2585   PT Stop Time 1103   PT Time Calculation (min) 48 min   Activity Tolerance Patient tolerated treatment well   Behavior During Therapy [facility] for tasks assessed/performed      Past Medical History  Diagnosis Date  . GERD (gastroesophageal reflux disease)     notices with chemo but not a chronic problem  . Nocturia   . Anxiety     takes Lorazepam daily if needed  . Insomnia     takes Ambien nightly if needed  . Depression     hx  . 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 Family history of adverse reaction to anesthesia     mom gets sick  . Wears glasses     Past Surgical History  Procedure Laterality Date  . Right oophorectomy  2012  . Wisdom tooth extraction  2001  . Orif finger / thumb fracture Right 1994    thumb  . Portacath placement  08/2014; 01/07/2015  . Complete mastectomy w/ sentinel node biopsy Left 02/10/2015  . Mastectomy Bilateral 02/10/2015  . Breast reconstruction with placement of tissue expander and flex hd (acellular hydrated dermis) Bilateral 02/10/2015  . Port-a-cath removal  01/07/2015  . Breast lumpectomy Left 08/2014  . Breast biopsy Bilateral 09/2014  . Nipple sparing mastectomy/sentinal lymph node biopsy/reconstruction/placement of tissue expander Bilateral 02/10/2015    Procedure: BILATERAL  NIPPLE SPARING MASTECTOMY WITH LEFT  SENTINAL LYMPH NODE BIOPSY(RIGHT BREAST PROPHYLACTIC);   Surgeon: Rolm Bookbinder, MD;  Location: [place];  Service: General;  Laterality: Bilateral;  . Breast reconstruction with placement of tissue expander and flex hd (acellular hydrated dermis) Bilateral 02/10/2015    Procedure: BREAST RECONSTRUCTION WITH PLACEMENT OF TISSUE EXPANDER AND FLEX HD (ACELLULAR HYDRATED DERMIS);  Surgeon: Irene Limbo, MD;  Location: [place];  Service: Plastics;  Laterality: Bilateral;  . Removal of tissue expander and placement of implant Right 02/27/2015    Procedure: REMOVAL OF TISSUE EXPANDER AND PLACEMENT OF NEW TISSUE EXPANDER;  Surgeon: Irene Limbo, MD;  Location: [place];  Service: Plastics;  Laterality: Right;  . Incision and drainage of wound Left 03/02/2015    Procedure: IRRIGATION BREAST POCKET AND EXCHANGE OF LEFT BREAST TISSUE EXPANDER;  Surgeon: Irene Limbo, MD;  Location: [place];  Service: Plastics;  Laterality: Left;  . Tissue expander placement Left 03/02/2015    Procedure: TISSUE EXPANDER;  Surgeon: Irene Limbo, MD;  Location: [place];  Service: Plastics;  Laterality: Left;    There were no vitals filed for this visit.  Visit Diagnosis:  Shoulder stiffness, left  Shoulder stiffness, right  Status post mastectomy, bilateral  At risk for lymphedema      Subjective Assessment - 03/17/15 1017    Subjective Nothing new since evaluation, can tell I'm tight though when I'm reaching to a high shelf. Just have some soreness in the chest, took some ibuprofen for that. I've  started walking every day like Inez Catalina suggested and that has been going well.    Currently in Pain? No/denies                         [facility] Adult PT Treatment/Exercise - 03/17/15 0001    Shoulder Exercises: ROM/Strengthening   Other ROM/Strengthening Exercises Supine cane flexion and standing cane abduction bil 5 reps each, 5 second holds.   Manual Therapy   Myofascial Release To bil chest wall in to horizontal and vertically on each side and UE pulling  during PROM   Passive ROM In Supine: To bil shoulders into flexion, abduction and D2 all to pts tolerance                PT Education - 03/17/15 1110    Education provided Yes   Education Details Supine and standing cane exercises; also encouraged pt to incorporate streching throughout her day with ADLs all to her tolerance.    Person(s) Educated Patient   Methods Explanation;Demonstration;Handout   Comprehension Verbalized understanding;Returned demonstration           Short Term Clinic Goals - 03/14/15 1713    CC Short Term Goal  #1   Title Patient will be able to perform initial home exercise  program to increase shoulder mobility.   Time 4   Period Weeks   Status New   CC Short Term Goal  #2   Title Patient will be able to increase bilateral shoulder flexion to >/= 155 degrees for increased ease reaching overhead   Time 4   Period Weeks   Status New   CC Short Term Goal  #3   Title Patient will be able to increase bilateral shoulder abduction to >/= 155 degrees for increased ease reaching overhead.   Time 4   Period Weeks   Status New   CC Short Term Goal  #4   Title Patient will be able to verbalize understanding of risk reduction practices for lymphedema.   Time 4   Period Weeks   Status New             Long Term Clinic Goals - 03/14/15 1717    CC Long Term Goal  #1   Title Patient will be able to increase bilateral shoulder flexion to >/= 160 degrees for increased ease reaching overhead   Time 8   Period Weeks   Status New   CC Long Term Goal  #2   Title Patient will be able to increase bilateral shoulder abduction to >/= 160 degrees for increased ease reaching overhead   Time 8   Period Weeks   Status New   CC Long Term Goal  #3   Title Patient will be able to verbalize and demonstrate understanding of a safe self-progression of her home exercise program   Time 8   Period Weeks   Status New   CC Long Term Goal  #4   Title Patient will be  able to return to >/= 75% of her normal household tasks   Time 8   Period Weeks   Status New            Plan - 03/17/15 1107    Clinical Impression Statement Pt tolerated treatment very well today and reported feeling less discomfort/tightness after treatment. She also returned good demo of initial HEP today.    Pt will benefit from skilled therapeutic intervention in order to improve on the  following deficits Decreased strength;Impaired UE functional use;Pain;Decreased range of motion;Decreased knowledge of precautions   Rehab Potential Excellent   Clinical Impairments Affecting Rehab Potential Recent infections in breasts from reconstruction   PT Frequency 2x / week   PT Duration 8 weeks   PT Treatment/Interventions Therapeutic exercise;Manual techniques;Patient/family education;Passive range of motion;ADLs/Self Care Home Management   PT Next Visit Plan Cont PROM, ROM exercises and progress HEP; have pt do pulleys and ball on wall next visit as well.   PT Home Exercise Plan Cane exercises   Recommended Other Services Pt to attend ABC class on 03/21/15.   Consulted and Agree with Plan of Care Patient        Problem List Patient Active Problem List   Diagnosis Date Noted  . Acquired absence of breast and nipple 02/27/2015  . Breast cancer, left breast 02/10/2015  . S/P bilateral mastectomy 02/10/2015  . Nonischemic cardiomyopathy 01/18/2015  . Esophagitis 10/25/2014  . Pharyngitis 10/15/2014  . Constipation 10/15/2014  . 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 Breast cancer of upper-outer quadrant of left female breast 09/14/2014  . SVD (spontaneous vaginal delivery) 12/03/2011    Otelia Limes, PTA 03/17/2015, 11:15 AM  [place], [place], [address] Phone: [phone]   Fax:  [phone]

## 2015-03-23 ENCOUNTER — Ambulatory Visit: Payer: Managed Care, Other (non HMO) | Attending: General Surgery

## 2015-03-23 DIAGNOSIS — Z9013 Acquired absence of bilateral breasts and nipples: Secondary | ICD-10-CM | POA: Insufficient documentation

## 2015-03-23 DIAGNOSIS — M25612 Stiffness of left shoulder, not elsewhere classified: Secondary | ICD-10-CM

## 2015-03-23 DIAGNOSIS — M25611 Stiffness of right shoulder, not elsewhere classified: Secondary | ICD-10-CM | POA: Insufficient documentation

## 2015-03-23 DIAGNOSIS — Z9189 Other specified personal risk factors, not elsewhere classified: Secondary | ICD-10-CM | POA: Diagnosis present

## 2015-03-23 NOTE — Patient Instructions (Signed)
Over Head Pull: Narrow Grip     Cancer Rehab [phone]   On back, knees bent, feet flat, band across thighs, elbows straight but relaxed. Pull hands apart (start). Keeping elbows straight, bring arms up and over head, hands toward floor. Keep pull steady on band. Hold momentarily. Return slowly, keeping pull steady, back to start. Repeat _10__ times. Band color __red____   Side Pull: Double Arm   On back, knees bent, feet flat. Arms perpendicular to body, shoulder level, elbows straight but relaxed. Pull arms out to sides, elbows straight. Resistance band comes across collarbones, hands toward floor. Hold momentarily. Slowly return to starting position. Repeat _10__ times. Band color _red____   Elmer Picker   On back, knees bent, feet flat, left hand on left hip, right hand above left. Pull right arm DIAGONALLY (hip to shoulder) across chest. Bring right arm along head toward floor. Hold momentarily. Slowly return to starting position. Repeat _10__ times. Do with left arm. Band color __red____   Shoulder Rotation: Double Arm   On back, knees bent, feet flat, elbows tucked at sides, bent 90, hands palms up. Pull hands apart and down toward floor, keeping elbows near sides. Hold momentarily. Slowly return to starting position. Repeat _10__ times. Band color __red____    Also:  Modified Downward Dog on Wall 5 reps for 5 seconds Can use a ball to roll up wall for front shoulder stretch and then side stretch with forearm on ball. About 10 reps each

## 2015-03-23 NOTE — Therapy (Signed)
[place], [place], [address] Phone: [phone]   Fax:  [phone]  Physical Therapy Treatment  Patient Details  Name: Natasha Torres MRN: [id number] Date of Birth: 12-19-1980 Referring Provider:  Rolm Bookbinder, MD  Encounter Date: 03/23/2015      PT End of Session - 03/23/15 1202    Visit Number 3   Number of Visits 24   Date for PT Re-Evaluation 05/09/15   PT Start Time 1113   PT Stop Time 1202   PT Time Calculation (min) 49 min   Activity Tolerance Patient tolerated treatment well   Behavior During Therapy [facility] for tasks assessed/performed      Past Medical History  Diagnosis Date  . GERD (gastroesophageal reflux disease)     notices with chemo but not a chronic problem  . Nocturia   . Anxiety     takes Lorazepam daily if needed  . Insomnia     takes Ambien nightly if needed  . Depression     hx  . 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 Family history of adverse reaction to anesthesia     mom gets sick  . Wears glasses     Past Surgical History  Procedure Laterality Date  . Right oophorectomy  2012  . Wisdom tooth extraction  2001  . Orif finger / thumb fracture Right 1994    thumb  . Portacath placement  08/2014; 01/07/2015  . Complete mastectomy w/ sentinel node biopsy Left 02/10/2015  . Mastectomy Bilateral 02/10/2015  . Breast reconstruction with placement of tissue expander and flex hd (acellular hydrated dermis) Bilateral 02/10/2015  . Port-a-cath removal  01/07/2015  . Breast lumpectomy Left 08/2014  . Breast biopsy Bilateral 09/2014  . Nipple sparing mastectomy/sentinal lymph node biopsy/reconstruction/placement of tissue expander Bilateral 02/10/2015    Procedure: BILATERAL  NIPPLE SPARING MASTECTOMY WITH LEFT  SENTINAL LYMPH NODE BIOPSY(RIGHT BREAST PROPHYLACTIC);   Surgeon: Rolm Bookbinder, MD;  Location: [place];  Service: General;  Laterality: Bilateral;  . Breast reconstruction with placement of tissue expander and flex hd (acellular hydrated dermis) Bilateral 02/10/2015    Procedure: BREAST RECONSTRUCTION WITH PLACEMENT OF TISSUE EXPANDER AND FLEX HD (ACELLULAR HYDRATED DERMIS);  Surgeon: Irene Limbo, MD;  Location: [place];  Service: Plastics;  Laterality: Bilateral;  . Removal of tissue expander and placement of implant Right 02/27/2015    Procedure: REMOVAL OF TISSUE EXPANDER AND PLACEMENT OF NEW TISSUE EXPANDER;  Surgeon: Irene Limbo, MD;  Location: [place];  Service: Plastics;  Laterality: Right;  . Incision and drainage of wound Left 03/02/2015    Procedure: IRRIGATION BREAST POCKET AND EXCHANGE OF LEFT BREAST TISSUE EXPANDER;  Surgeon: Irene Limbo, MD;  Location: [place];  Service: Plastics;  Laterality: Left;  . Tissue expander placement Left 03/02/2015    Procedure: TISSUE EXPANDER;  Surgeon: Irene Limbo, MD;  Location: [place];  Service: Plastics;  Laterality: Left;    There were no vitals filed for this visit.  Visit Diagnosis:  Shoulder stiffness, left  Shoulder stiffness, right  Status post mastectomy, bilateral  At risk for lymphedema      Subjective Assessment - 03/23/15 1118    Subjective My Lt breast looked a little pink to me on Friday so I took a picture and emailed it to Dr. Iran Planas, who I have an appt with tomorrow  to begin discussing  my reconstruction, and she called in a prescription for me for the antibiotic CiproFlaxacin but not sure how many mg it is. Been taking it since Friday and feel like it looks a little better. Been doing my stretches at home 2x/day every day and can tell an improvement already.    Currently in Pain? No/denies            [facility] PT Assessment - 03/23/15 0001    AROM   Right Shoulder Flexion 158 Degrees   Right Shoulder ABduction 162 Degrees   Left Shoulder Flexion 156 Degrees    Left Shoulder ABduction 165 Degrees                     [facility] Adult PT Treatment/Exercise - 03/23/15 0001    Shoulder Exercises: Supine   Other Supine Exercises Supine scapular series with red theraband 8 reps each   Shoulder Exercises: Pulleys   Flexion Limitations Very minimal stretch felt so stopped   ABduction 3 minutes   Shoulder Exercises: Therapy Ball   Flexion 10 reps  Roll yellow ball up wall   ABduction 5 reps  Bil shoulders   Shoulder Exercises: ROM/Strengthening   "W" Arms Standing against wall sliding arms up into "W"   Other ROM/Strengthening Exercises Modified downward dog on wall 5 reps for 5 seconds   Manual Therapy   Myofascial Release To bil chest wall in to horizontal and vertically on each side and UE pulling during PROM   Passive ROM In Supine: To bil shoulders into flexion, abduction and D2 all to pts tolerance                PT Education - 03/23/15 1138    Education provided Yes   Education Details Supine scapular series   Person(s) Educated Patient   Methods Explanation;Demonstration;Handout   Comprehension Verbalized understanding;Returned demonstration           Short Term Clinic Goals - 03/23/15 1223    CC Short Term Goal  #1   Title Patient will be able to perform initial home exercise  program to increase shoulder mobility.   CC Short Term Goal  #2   Title Patient will be able to increase bilateral shoulder flexion to >/= 155 degrees for increased ease reaching overhead  Rt 158 and Lt 156 degrees today 03/23/15   Status Achieved   CC Short Term Goal  #3   Title Patient will be able to increase bilateral shoulder abduction to >/= 155 degrees for increased ease reaching overhead.  Rt 162 and Lt 165 degrees today 03/23/15   CC Short Term Goal  #4   Title Patient will be able to verbalize understanding of risk reduction practices for lymphedema.   Status On-going             Long Term Clinic Goals - 03/23/15 1226     CC Long Term Goal  #1   Title Patient will be able to increase bilateral shoulder flexion to >/= 160 degrees for increased ease reaching overhead  See short term goal, 03/23/15   Status On-going   CC Long Term Goal  #2   Title Patient will be able to increase bilateral shoulder abduction to >/= 160 degrees for increased ease reaching overhead  See short term goal. 03/23/15   Status Achieved   CC Long Term Goal  #3   Title Patient will be able to verbalize and demonstrate understanding of a safe  self-progression of her home exercise program   Status On-going   CC Long Term Goal  #4   Title Patient will be able to return to >/= 75% of her normal household tasks   Status On-going            Plan - 03/23/15 1203    Clinical Impression Statement Pt is continuing to do very well with her therapy. She has been compliant with HEP and her ROM has increased noting her improvement from last week. Began supine scapular strengthening exercises today to begin gentle strengthening exercises and progressed her ROM HEP to incorporate end ROM stretching. Has met most short term goals at this time.    Pt will benefit from skilled therapeutic intervention in order to improve on the following deficits Decreased strength;Impaired UE functional use;Pain;Decreased range of motion;Decreased knowledge of precautions   Rehab Potential Excellent   Clinical Impairments Affecting Rehab Potential Recent infections in breasts from reconstruction   PT Frequency 2x / week   PT Duration 8 weeks   PT Treatment/Interventions Therapeutic exercise;Manual techniques;Patient/family education;Passive range of motion;ADLs/Self Care Home Management   PT Next Visit Plan Cont PROM, ROM exercises and progress HEP.    PT Home Exercise Plan Supine scapular series   Consulted and Agree with Plan of Care Patient        Problem List Patient Active Problem List   Diagnosis Date Noted  . Acquired absence of breast and nipple  02/27/2015  . Breast cancer, left breast ([place]) 02/10/2015  . S/P bilateral mastectomy 02/10/2015  . Nonischemic cardiomyopathy ([place]) 01/18/2015  . Esophagitis 10/25/2014  . Pharyngitis 10/15/2014  . Constipation 10/15/2014  . 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 Breast cancer of upper-outer quadrant of left female breast ([place]) 09/14/2014  . SVD (spontaneous vaginal delivery) 12/03/2011    Otelia Limes, PTA 03/23/2015, 12:33 PM  [place], [place], [address] Phone: [phone]   Fax:  [phone]

## 2015-03-24 ENCOUNTER — Ambulatory Visit: Payer: Managed Care, Other (non HMO)

## 2015-03-24 DIAGNOSIS — M25611 Stiffness of right shoulder, not elsewhere classified: Secondary | ICD-10-CM

## 2015-03-24 DIAGNOSIS — Z9013 Acquired absence of bilateral breasts and nipples: Secondary | ICD-10-CM

## 2015-03-24 DIAGNOSIS — Z9189 Other specified personal risk factors, not elsewhere classified: Secondary | ICD-10-CM

## 2015-03-24 DIAGNOSIS — M25612 Stiffness of left shoulder, not elsewhere classified: Secondary | ICD-10-CM | POA: Diagnosis not present

## 2015-03-24 NOTE — Therapy (Signed)
[place], [place], [address] Phone: [phone]   Fax:  [phone]  Physical Therapy Treatment  Patient Details  Name: Natasha Torres MRN: [id number] Date of Birth: [birth year] Referring Provider:  Rolm Bookbinder, MD  Encounter Date: 03/24/2015      PT End of Session - 03/24/15 1017    Visit Number 4   Number of Visits 24   Date for PT Re-Evaluation 05/09/15   PT Start Time 1016   PT Stop Time 1100   PT Time Calculation (min) 44 min   Activity Tolerance Patient tolerated treatment well   Behavior During Therapy [facility] for tasks assessed/performed      Past Medical History  Diagnosis Date  . GERD (gastroesophageal reflux disease)     notices with chemo but not a chronic problem  . Nocturia   . Anxiety     takes Lorazepam daily if needed  . Insomnia     takes Ambien nightly if needed  . Depression     hx  . 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 Family history of adverse reaction to anesthesia     mom gets sick  . Wears glasses     Past Surgical History  Procedure Laterality Date  . Right oophorectomy  2012  . Wisdom tooth extraction  2001  . Orif finger / thumb fracture Right 1994    thumb  . Portacath placement  08/2014; 01/07/2015  . Complete mastectomy w/ sentinel node biopsy Left 02/10/2015  . Mastectomy Bilateral 02/10/2015  . Breast reconstruction with placement of tissue expander and flex hd (acellular hydrated dermis) Bilateral 02/10/2015  . Port-a-cath removal  01/07/2015  . Breast lumpectomy Left 08/2014  . Breast biopsy Bilateral 09/2014  . Nipple sparing mastectomy/sentinal lymph node biopsy/reconstruction/placement of tissue expander Bilateral 02/10/2015    Procedure: BILATERAL  NIPPLE SPARING MASTECTOMY WITH LEFT  SENTINAL LYMPH NODE BIOPSY(RIGHT BREAST PROPHYLACTIC);   Surgeon: Rolm Bookbinder, MD;  Location: [place];  Service: General;  Laterality: Bilateral;  . Breast reconstruction with placement of tissue expander and flex hd (acellular hydrated dermis) Bilateral 02/10/2015    Procedure: BREAST RECONSTRUCTION WITH PLACEMENT OF TISSUE EXPANDER AND FLEX HD (ACELLULAR HYDRATED DERMIS);  Surgeon: Irene Limbo, MD;  Location: [place];  Service: Plastics;  Laterality: Bilateral;  . Removal of tissue expander and placement of implant Right 02/27/2015    Procedure: REMOVAL OF TISSUE EXPANDER AND PLACEMENT OF NEW TISSUE EXPANDER;  Surgeon: Irene Limbo, MD;  Location: [employer];  Service: Plastics;  Laterality: Right;  . Incision and drainage of wound Left 03/02/2015    Procedure: IRRIGATION BREAST POCKET AND EXCHANGE OF LEFT BREAST TISSUE EXPANDER;  Surgeon: Irene Limbo, MD;  Location: [place];  Service: Plastics;  Laterality: Left;  . Tissue expander placement Left 03/02/2015    Procedure: TISSUE EXPANDER;  Surgeon: Irene Limbo, MD;  Location: [place];  Service: Plastics;  Laterality: Left;    There were no vitals filed for this visit.  Visit Diagnosis:  Shoulder stiffness, left  Shoulder stiffness, right  Status post mastectomy, bilateral  At risk for lymphedema      Subjective Assessment - 03/24/15 0937    Subjective Was a little sore after yesterdays visit, not painfully so. My body just felt really tired. Been walking every day, I'm glad Inez Catalina suggested that, bc my energy has  been improving and I'm up to being able to tolerate 30 mins now.    Currently in Pain? No/denies                         [facility] Adult PT Treatment/Exercise - 03/24/15 0001    Shoulder Exercises: Pulleys   ABduction 2 minutes   Shoulder Exercises: Therapy Ball   Flexion 10 reps  Roll yellow ball up wall   ABduction 5 reps  Bil shoulders with forearm on ball   Shoulder Exercises: ROM/Strengthening   "W" Arms Standing against wall sliding arms up into "W"  5 reps for 5 second holds   Other ROM/Strengthening Exercises Modified downward dog on wall 5 reps for 5 seconds   Other ROM/Strengthening Exercises Doorway stretch 3 reps, for 5 seconds   Manual Therapy   Myofascial Release To bil chest wall in to horizontal and vertically on each side and UE pulling during PROM   Passive ROM In Supine: To bil shoulders into flexion, abduction and D2 all to pts tolerance                PT Education - 03/23/15 1138    Education provided Yes   Education Details Supine scapular series   Person(s) Educated Patient   Methods Explanation;Demonstration;Handout   Comprehension Verbalized understanding;Returned demonstration           Short Term Clinic Goals - 03/23/15 1223    CC Short Term Goal  #1   Title Patient will be able to perform initial home exercise  program to increase shoulder mobility.   CC Short Term Goal  #2   Title Patient will be able to increase bilateral shoulder flexion to >/= 155 degrees for increased ease reaching overhead  Rt 158 and Lt 156 degrees today 03/23/15   Status Achieved   CC Short Term Goal  #3   Title Patient will be able to increase bilateral shoulder abduction to >/= 155 degrees for increased ease reaching overhead.  Rt 162 and Lt 165 degrees today 03/23/15   CC Short Term Goal  #4   Title Patient will be able to verbalize understanding of risk reduction practices for lymphedema.   Status On-going             Long Term Clinic Goals - 03/23/15 1226    CC Long Term Goal  #1   Title Patient will be able to increase bilateral shoulder flexion to >/= 160 degrees for increased ease reaching overhead  See short term goal, 03/23/15   Status On-going   CC Long Term Goal  #2   Title Patient will be able to increase bilateral shoulder abduction to >/= 160 degrees for increased ease reaching overhead  See short term goal. 03/23/15   Status Achieved   CC Long Term Goal  #3   Title Patient will be able to  verbalize and demonstrate understanding of a safe self-progression of her home exercise program   Status On-going   CC Long Term Goal  #4   Title Patient will be able to return to >/= 75% of her normal household tasks   Status On-going            Plan - 03/24/15 1026    Clinical Impression Statement Continued with manual techniques gently to decrease soreness at bil chest wall and pt reported feeling better after treatment, still sore but less than before. PT conitnues to incorporate HEP into ADLs.  Pt will benefit from skilled therapeutic intervention in order to improve on the following deficits Decreased strength;Impaired UE functional use;Pain;Decreased range of motion;Decreased knowledge of precautions   Rehab Potential Excellent   Clinical Impairments Affecting Rehab Potential Recent infections in breasts from reconstruction   PT Frequency 2x / week   PT Duration 8 weeks   PT Treatment/Interventions Therapeutic exercise;Manual techniques;Patient/family education;Passive range of motion;ADLs/Self Care Home Management   PT Next Visit Plan Cont PROM, ROM exercises and progress HEP.    Consulted and Agree with Plan of Care Patient        Problem List Patient Active Problem List   Diagnosis Date Noted  . Acquired absence of breast and nipple 02/27/2015  . Breast cancer, left breast ([place]) 02/10/2015  . S/P bilateral mastectomy 02/10/2015  . Nonischemic cardiomyopathy ([place]) 01/18/2015  . Esophagitis 10/25/2014  . Pharyngitis 10/15/2014  . Constipation 10/15/2014  . 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 Breast cancer of upper-outer quadrant of left female breast ([place]) 09/14/2014  . SVD (spontaneous vaginal delivery) 12/03/2011    Otelia Limes, PTA 03/24/2015, 10:29 AM  [place], [place], [address] Phone: [phone]   Fax:  [phone]

## 2015-03-28 ENCOUNTER — Ambulatory Visit: Payer: Managed Care, Other (non HMO)

## 2015-03-28 DIAGNOSIS — M25612 Stiffness of left shoulder, not elsewhere classified: Secondary | ICD-10-CM | POA: Diagnosis not present

## 2015-03-28 DIAGNOSIS — Z9189 Other specified personal risk factors, not elsewhere classified: Secondary | ICD-10-CM

## 2015-03-28 DIAGNOSIS — Z9013 Acquired absence of bilateral breasts and nipples: Secondary | ICD-10-CM

## 2015-03-28 DIAGNOSIS — M25611 Stiffness of right shoulder, not elsewhere classified: Secondary | ICD-10-CM

## 2015-03-28 NOTE — Therapy (Signed)
[place], [place], [address] Phone: [phone]   Fax:  [phone]  Physical Therapy Treatment  Patient Details  Name: Natasha Torres MRN: [id number] Date of Birth: 08-29-1980 Referring Provider:  Rolm Bookbinder, MD  Encounter Date: 03/28/2015      PT End of Session - 03/28/15 1137    Visit Number 5   Number of Visits 24   Date for PT Re-Evaluation 05/09/15   PT Start Time 1022   PT Stop Time 1105   PT Time Calculation (min) 43 min   Activity Tolerance Patient tolerated treatment well   Behavior During Therapy [facility] for tasks assessed/performed      Past Medical History  Diagnosis Date  . GERD (gastroesophageal reflux disease)     notices with chemo but not a chronic problem  . Nocturia   . Anxiety     takes Lorazepam daily if needed  . Insomnia     takes Ambien nightly if needed  . Depression     hx  . 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 Family history of adverse reaction to anesthesia     mom gets sick  . Wears glasses     Past Surgical History  Procedure Laterality Date  . Right oophorectomy  2012  . Wisdom tooth extraction  2001  . Orif finger / thumb fracture Right 1994    thumb  . Portacath placement  08/2014; 01/07/2015  . Complete mastectomy w/ sentinel node biopsy Left 02/10/2015  . Mastectomy Bilateral 02/10/2015  . Breast reconstruction with placement of tissue expander and flex hd (acellular hydrated dermis) Bilateral 02/10/2015  . Port-a-cath removal  01/07/2015  . Breast lumpectomy Left 08/2014  . Breast biopsy Bilateral 09/2014  . Nipple sparing mastectomy/sentinal lymph node biopsy/reconstruction/placement of tissue expander Bilateral 02/10/2015    Procedure: BILATERAL  NIPPLE SPARING MASTECTOMY WITH LEFT  SENTINAL LYMPH NODE BIOPSY(RIGHT BREAST PROPHYLACTIC);   Surgeon: Rolm Bookbinder, MD;  Location: [place];  Service: General;  Laterality: Bilateral;  . Breast reconstruction with placement of tissue expander and flex hd (acellular hydrated dermis) Bilateral 02/10/2015    Procedure: BREAST RECONSTRUCTION WITH PLACEMENT OF TISSUE EXPANDER AND FLEX HD (ACELLULAR HYDRATED DERMIS);  Surgeon: Irene Limbo, MD;  Location: [place];  Service: Plastics;  Laterality: Bilateral;  . Removal of tissue expander and placement of implant Right 02/27/2015    Procedure: REMOVAL OF TISSUE EXPANDER AND PLACEMENT OF NEW TISSUE EXPANDER;  Surgeon: Irene Limbo, MD;  Location: [place];  Service: Plastics;  Laterality: Right;  . Incision and drainage of wound Left 03/02/2015    Procedure: IRRIGATION BREAST POCKET AND EXCHANGE OF LEFT BREAST TISSUE EXPANDER;  Surgeon: Irene Limbo, MD;  Location: [place];  Service: Plastics;  Laterality: Left;  . Tissue expander placement Left 03/02/2015    Procedure: TISSUE EXPANDER;  Surgeon: Irene Limbo, MD;  Location: [place];  Service: Plastics;  Laterality: Left;    There were no vitals filed for this visit.  Visit Diagnosis:  Shoulder stiffness, left  Shoulder stiffness, right  Status post mastectomy, bilateral  At risk for lymphedema      Subjective Assessment - 03/28/15 1031    Subjective Started Tamoxifen today so we'll see how that goes. Only time I really feel the tightness at the end of my ROM is putting on deodorant/reaching to back of  my neck.    Currently in Pain? No/denies                         [facility] Adult PT Treatment/Exercise - 03/28/15 0001    Shoulder Exercises: Pulleys   ABduction 2 minutes   Shoulder Exercises: Therapy Ball   Flexion 10 reps  Roll yellow ball up wall   ABduction 5 reps  Bil UE with foream on ball   Shoulder Exercises: ROM/Strengthening   "W" Arms Standing against wall sliding arms up into "W" 5 reps for 5 second holds   Other ROM/Strengthening Exercises Reviewed  scapular sereis but in standing today with green theraband, 10 reps of horizontal abduction and flexion with narrow grip, then 5 reps with remaining, pt returned excellent demo.    Other ROM/Strengthening Exercises Doorway stretch 3 reps, for 10 seconds   Manual Therapy   Myofascial Release To bil chest wall in to horizontal and vertically on each side and UE pulling during PROM   Scapular Mobilization In S/L to both sides into retraction and protraction with UE pulling    Passive ROM In Supine: To bil shoulders into flexion, abduction and D2 all to pts tolerance, also rolled into S/L on each side for continued flexion stretching                    Short Term Clinic Goals - 03/28/15 1157    CC Short Term Goal  #1   Title Patient will be able to perform initial home exercise  program to increase shoulder mobility.   Status Achieved   CC Short Term Goal  #2   Title Patient will be able to increase bilateral shoulder flexion to >/= 155 degrees for increased ease reaching overhead   Status Achieved   CC Short Term Goal  #3   Title Patient will be able to increase bilateral shoulder abduction to >/= 155 degrees for increased ease reaching overhead.   Status Achieved   CC Short Term Goal  #4   Title Patient will be able to verbalize understanding of risk reduction practices for lymphedema.   Status On-going             Long Term Clinic Goals - 03/23/15 1226    CC Long Term Goal  #1   Title Patient will be able to increase bilateral shoulder flexion to >/= 160 degrees for increased ease reaching overhead  See short term goal, 03/23/15   Status On-going   CC Long Term Goal  #2   Title Patient will be able to increase bilateral shoulder abduction to >/= 160 degrees for increased ease reaching overhead  See short term goal. 03/23/15   Status Achieved   CC Long Term Goal  #3   Title Patient will be able to verbalize and demonstrate understanding of a safe self-progression of her  home exercise program   Status On-going   CC Long Term Goal  #4   Title Patient will be able to return to >/= 75% of her normal household tasks   Status On-going            Plan - 03/28/15 1159    Clinical Impression Statement Pt continues to tolerate exercises and stretching very well with reporting noticing increase in her AROM with ALDs. She has very minimal limitations at this time and is progressing well with her activities. Pt is to start back to work next week as well as  Dr. Iran Planas cleared her for this last week.    Pt will benefit from skilled therapeutic intervention in order to improve on the following deficits Decreased strength;Impaired UE functional use;Pain;Decreased range of motion;Decreased knowledge of precautions   Rehab Potential Excellent   Clinical Impairments Affecting Rehab Potential Recent infections in breasts from reconstruction   PT Frequency 2x / week   PT Duration 8 weeks   PT Treatment/Interventions Therapeutic exercise;Manual techniques;Patient/family education;Passive range of motion;ADLs/Self Care Home Management   PT Next Visit Plan Remeasure ROM and assess goals next visit. Discuss possibility of discharge sson as pt is progressing very well with all goals. Cont PROM, ROM exercises and progress HEP. Insrtuct in Strength ABC Program   PT Home Exercise Plan Supine scapular series, but can also do in standing   Consulted and Agree with Plan of Care Patient        Problem List Patient Active Problem List   Diagnosis Date Noted  . Acquired absence of breast and nipple 02/27/2015  . Breast cancer, left breast ([place]) 02/10/2015  . S/P bilateral mastectomy 02/10/2015  . Nonischemic cardiomyopathy ([place]) 01/18/2015  . Esophagitis 10/25/2014  . Pharyngitis 10/15/2014  . Constipation 10/15/2014  . 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 Breast cancer of upper-outer quadrant of left female breast ([place]) 09/14/2014  . SVD (spontaneous  vaginal delivery) 12/03/2011    Otelia Limes, PTA 03/28/2015, 12:05 PM  [place], [place], [address] Phone: [phone]   Fax:  [phone]

## 2015-03-30 ENCOUNTER — Ambulatory Visit: Payer: Managed Care, Other (non HMO)

## 2015-03-30 DIAGNOSIS — M25612 Stiffness of left shoulder, not elsewhere classified: Secondary | ICD-10-CM

## 2015-03-30 DIAGNOSIS — Z9013 Acquired absence of bilateral breasts and nipples: Secondary | ICD-10-CM

## 2015-03-30 DIAGNOSIS — Z9189 Other specified personal risk factors, not elsewhere classified: Secondary | ICD-10-CM

## 2015-03-30 DIAGNOSIS — M25611 Stiffness of right shoulder, not elsewhere classified: Secondary | ICD-10-CM

## 2015-03-30 NOTE — Therapy (Signed)
[place], [place], [address] Phone: [phone]   Fax:  [phone]  Physical Therapy Treatment  Patient Details  Name: Natasha Torres MRN: [id number] Date of Birth: June 08, 1981 Referring Provider:  Rolm Bookbinder, MD  Encounter Date: 03/30/2015      PT End of Session - 03/30/15 1218    Visit Number 6   Number of Visits 24   Date for PT Re-Evaluation 05/09/15   PT Start Time 1028   PT Stop Time 1112   PT Time Calculation (min) 44 min   Activity Tolerance Patient tolerated treatment well   Behavior During Therapy [facility] for tasks assessed/performed      Past Medical History  Diagnosis Date  . GERD (gastroesophageal reflux disease)     notices with chemo but not a chronic problem  . Nocturia   . Anxiety     takes Lorazepam daily if needed  . Insomnia     takes Ambien nightly if needed  . Depression     hx  . 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 Family history of adverse reaction to anesthesia     mom gets sick  . Wears glasses     Past Surgical History  Procedure Laterality Date  . Right oophorectomy  2012  . Wisdom tooth extraction  2001  . Orif finger / thumb fracture Right 1994    thumb  . Portacath placement  08/2014; 01/07/2015  . Complete mastectomy w/ sentinel node biopsy Left 02/10/2015  . Mastectomy Bilateral 02/10/2015  . Breast reconstruction with placement of tissue expander and flex hd (acellular hydrated dermis) Bilateral 02/10/2015  . Port-a-cath removal  01/07/2015  . Breast lumpectomy Left 08/2014  . Breast biopsy Bilateral 09/2014  . Nipple sparing mastectomy/sentinal lymph node biopsy/reconstruction/placement of tissue expander Bilateral 02/10/2015    Procedure: BILATERAL  NIPPLE SPARING MASTECTOMY WITH LEFT  SENTINAL LYMPH NODE BIOPSY(RIGHT BREAST PROPHYLACTIC);   Surgeon: Rolm Bookbinder, MD;  Location: [place];  Service: General;  Laterality: Bilateral;  . Breast reconstruction with placement of tissue expander and flex hd (acellular hydrated dermis) Bilateral 02/10/2015    Procedure: BREAST RECONSTRUCTION WITH PLACEMENT OF TISSUE EXPANDER AND FLEX HD (ACELLULAR HYDRATED DERMIS);  Surgeon: Irene Limbo, MD;  Location: [place];  Service: Plastics;  Laterality: Bilateral;  . Removal of tissue expander and placement of implant Right 02/27/2015    Procedure: REMOVAL OF TISSUE EXPANDER AND PLACEMENT OF NEW TISSUE EXPANDER;  Surgeon: Irene Limbo, MD;  Location: [place];  Service: Plastics;  Laterality: Right;  . Incision and drainage of wound Left 03/02/2015    Procedure: IRRIGATION BREAST POCKET AND EXCHANGE OF LEFT BREAST TISSUE EXPANDER;  Surgeon: Irene Limbo, MD;  Location: [place];  Service: Plastics;  Laterality: Left;  . Tissue expander placement Left 03/02/2015    Procedure: TISSUE EXPANDER;  Surgeon: Irene Limbo, MD;  Location: [place];  Service: Plastics;  Laterality: Left;    There were no vitals filed for this visit.  Visit Diagnosis:  Shoulder stiffness, left  Shoulder stiffness, right  Status post mastectomy, bilateral  At risk for lymphedema      Subjective Assessment - 03/30/15 1032    Subjective Didn't sleep good last night, don't know if it's from the Tamoxifen or what? Had one sore spot from our stertching the other day that felt like it was  under my Lt expander.    Currently in Pain? Yes   Pain Score 3    Pain Location Chest  Under expander   Pain Orientation Left   Pain Descriptors / Indicators Sore   Aggravating Factors  Over stretching, move arm i certain position   Pain Relieving Factors rest                         [facility] Adult PT Treatment/Exercise - 03/30/15 0001    Manual Therapy   Soft tissue mobilization To bil chest areas    Myofascial Release To bil chest wall in to horizontal and  vertically on each side and UE pulling during PROM                   Short Term Clinic Goals - 03/28/15 1157    CC Short Term Goal  #1   Title Patient will be able to perform initial home exercise  program to increase shoulder mobility.   Status Achieved   CC Short Term Goal  #2   Title Patient will be able to increase bilateral shoulder flexion to >/= 155 degrees for increased ease reaching overhead   Status Achieved   CC Short Term Goal  #3   Title Patient will be able to increase bilateral shoulder abduction to >/= 155 degrees for increased ease reaching overhead.   Status Achieved   CC Short Term Goal  #4   Title Patient will be able to verbalize understanding of risk reduction practices for lymphedema.   Status On-going             Long Term Clinic Goals - 03/30/15 1223    CC Long Term Goal  #1   Title Patient will be able to increase bilateral shoulder flexion to >/= 160 degrees for increased ease reaching overhead   Status On-going   CC Long Term Goal  #3   Title Patient will be able to verbalize and demonstrate understanding of a safe self-progression of her home exercise program   Status On-going   CC Long Term Goal  #4   Title Patient will be able to return to >/= 75% of her normal household tasks  Pt reports feeling less tightness at her end ROM now with reaching activities at home.    Status Partially Met            Plan - 03/30/15 1219    Clinical Impression Statement Pt came in c/o of some pain under Lt breast expander so spent treatment time today doing light manual techniques to help decrease pts pain/tenderness that was possibly from last treatment with new stretching. She reported feeling better after session today.    Pt will benefit from skilled therapeutic intervention in order to improve on the following deficits Decreased strength;Impaired UE functional use;Pain;Decreased range of motion;Decreased knowledge of precautions   Rehab  Potential Excellent   Clinical Impairments Affecting Rehab Potential Recent infections in breasts from reconstruction   PT Frequency 2x / week   PT Duration 8 weeks   PT Treatment/Interventions Therapeutic exercise;Manual techniques;Patient/family education;Passive range of motion;ADLs/Self Care Home Management   PT Next Visit Plan Remeasure ROM and assess goals next visit. Cont PROM, ROM exercises and progress HEP. Instruct in Strength ABC Program.   Consulted and Agree with Plan of Care Patient        Problem List Patient Active Problem List   Diagnosis Date Noted  . Acquired absence  of breast and nipple 02/27/2015  . Breast cancer, left breast ([place]) 02/10/2015  . S/P bilateral mastectomy 02/10/2015  . Nonischemic cardiomyopathy ([place]) 01/18/2015  . Esophagitis 10/25/2014  . Pharyngitis 10/15/2014  . Constipation 10/15/2014  . 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 Breast cancer of upper-outer quadrant of left female breast ([place]) 09/14/2014  . SVD (spontaneous vaginal delivery) 12/03/2011    Otelia Limes, PTA 03/30/2015, 12:35 PM  [place], [place], [address] Phone: [phone]   Fax:  [phone]

## 2015-04-03 NOTE — Assessment & Plan Note (Signed)
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 Biopsy of the right breast mass and left axillary lymph node and the secondary mass left breast back as benign. Status post neo-adjuvant TCH [place] 6 cycles from 09/27/2014 to 01/10/2015 Followed by bilateral mastectomies on 02/10/2015: No malignancy in either breast  Pathology review: I discussed the final pathology report and provided her with a copy of the report. There is no evidence of residual disease. Lymph nodes were also negative.  Plan: 1. Continue maintenance Herceptin to complete 1 year of therapy. However Herceptin is on hold because of decline in ejection fraction. Echocardiogram on 02/03/2015 showed an ejection fraction of 45%. This has dropped from 55% previously. Dr. Kirk Ruths is monitoring her. She will resume herceptin for 2 cycles and then she will have an ECHO. 2. Continue with her breast reconstruction process in the meantime. 3. We will start anti-estrogen therapy with tamoxifen with Zoladex for ovarian suppression.  Tamoxifen toxicities:

## 2015-04-04 ENCOUNTER — Ambulatory Visit (HOSPITAL_BASED_OUTPATIENT_CLINIC_OR_DEPARTMENT_OTHER): Payer: Managed Care, Other (non HMO) | Admitting: Hematology and Oncology

## 2015-04-04 ENCOUNTER — Telehealth: Payer: Self-pay | Admitting: Hematology and Oncology

## 2015-04-04 ENCOUNTER — Other Ambulatory Visit: Payer: Self-pay | Admitting: *Deleted

## 2015-04-04 ENCOUNTER — Ambulatory Visit (HOSPITAL_BASED_OUTPATIENT_CLINIC_OR_DEPARTMENT_OTHER): Payer: Managed Care, Other (non HMO)

## 2015-04-04 ENCOUNTER — Ambulatory Visit: Payer: Managed Care, Other (non HMO)

## 2015-04-04 ENCOUNTER — Other Ambulatory Visit (HOSPITAL_BASED_OUTPATIENT_CLINIC_OR_DEPARTMENT_OTHER): Payer: Managed Care, Other (non HMO)

## 2015-04-04 ENCOUNTER — Encounter: Payer: Self-pay | Admitting: *Deleted

## 2015-04-04 ENCOUNTER — Encounter: Payer: Self-pay | Admitting: Hematology and Oncology

## 2015-04-04 VITALS — BP 101/66 | HR 81 | Temp 98.2°F | Resp 18 | Ht 71.0 in | Wt 182.4 lb

## 2015-04-04 DIAGNOSIS — Z5111 Encounter for antineoplastic chemotherapy: Secondary | ICD-10-CM

## 2015-04-04 DIAGNOSIS — C50412 Malignant neoplasm of upper-outer quadrant of left female breast: Secondary | ICD-10-CM

## 2015-04-04 DIAGNOSIS — C50512 Malignant neoplasm of lower-outer quadrant of left female breast: Secondary | ICD-10-CM

## 2015-04-04 DIAGNOSIS — N61 Mastitis without abscess: Secondary | ICD-10-CM

## 2015-04-04 DIAGNOSIS — Z5112 Encounter for antineoplastic immunotherapy: Secondary | ICD-10-CM

## 2015-04-04 DIAGNOSIS — N951 Menopausal and female climacteric states: Secondary | ICD-10-CM | POA: Diagnosis not present

## 2015-04-04 DIAGNOSIS — I890 Lymphedema, not elsewhere classified: Secondary | ICD-10-CM | POA: Insufficient documentation

## 2015-04-04 LAB — COMPREHENSIVE METABOLIC PANEL (CC13)
ALBUMIN: 3.3 g/dL — AB (ref 3.5–5.0)
ALK PHOS: 66 U/L (ref 40–150)
ALT: 11 U/L (ref 0–55)
AST: 17 U/L (ref 5–34)
Anion Gap: 7 mEq/L (ref 3–11)
BUN: 11 mg/dL (ref 7.0–26.0)
CO2: 26 mEq/L (ref 22–29)
Calcium: 9.7 mg/dL (ref 8.4–10.4)
Chloride: 107 mEq/L (ref 98–109)
Creatinine: 0.8 mg/dL (ref 0.6–1.1)
EGFR: >90 mL/min/{1.73_m2} (ref >90)
GLUCOSE: 88 mg/dL (ref 70–140)
POTASSIUM: 4.7 meq/L (ref 3.5–5.1)
SODIUM: 141 meq/L (ref 136–145)
TOTAL PROTEIN: 6.8 g/dL (ref 6.4–8.3)
Total Bilirubin: <0.3 mg/dL (ref 0.20–1.20)

## 2015-04-04 LAB — CBC WITH DIFFERENTIAL/PLATELET
BASO%: 0.8 % (ref 0.0–2.0)
Basophils Absolute: 0 10*3/uL (ref 0.0–0.1)
EOS ABS: 0.3 10*3/uL (ref 0.0–0.5)
EOS%: 6 % (ref 0.0–7.0)
HCT: 31.3 % — ABNORMAL LOW (ref 34.8–46.6)
HGB: 10.2 g/dL — ABNORMAL LOW (ref 11.6–15.9)
LYMPH%: 15.7 % (ref 14.0–49.7)
MCH: 29.5 pg (ref 25.1–34.0)
MCHC: 32.5 g/dL (ref 31.5–36.0)
MCV: 90.7 fL (ref 79.5–101.0)
MONO#: 0.5 10*3/uL (ref 0.1–0.9)
MONO%: 9.2 % (ref 0.0–14.0)
NEUT%: 68.3 % (ref 38.4–76.8)
NEUTROS ABS: 3.7 10*3/uL (ref 1.5–6.5)
Platelets: 183 10*3/uL (ref 145–400)
RBC: 3.45 10*6/uL — AB (ref 3.70–5.45)
RDW: 13.9 % (ref 11.2–14.5)
WBC: 5.4 10*3/uL (ref 3.9–10.3)
lymph#: 0.8 10*3/uL — ABNORMAL LOW (ref 0.9–3.3)

## 2015-04-04 MED ORDER — SODIUM CHLORIDE 0.9 % IJ SOLN
10.0000 mL | INTRAMUSCULAR | Status: DC | PRN
  Administered 2015-04-04: 10 mL
  Filled 2015-04-04: qty 10

## 2015-04-04 MED ORDER — SODIUM CHLORIDE 0.9 % IV SOLN
Freq: Once | INTRAVENOUS | Status: AC
  Administered 2015-04-04: 11:00:00 via INTRAVENOUS

## 2015-04-04 MED ORDER — GOSERELIN ACETATE 3.6 MG ~~LOC~~ IMPL
3.6000 mg | DRUG_IMPLANT | Freq: Once | SUBCUTANEOUS | Status: AC
  Administered 2015-04-04: 3.6 mg via SUBCUTANEOUS
  Filled 2015-04-04: qty 3.6

## 2015-04-04 MED ORDER — DIPHENHYDRAMINE HCL 25 MG PO CAPS
50.0000 mg | ORAL_CAPSULE | Freq: Once | ORAL | Status: AC
  Administered 2015-04-04: 50 mg via ORAL

## 2015-04-04 MED ORDER — VANCOMYCIN HCL 1000 MG IV SOLR
1000.0000 mg | Freq: Once | INTRAVENOUS | Status: DC
  Filled 2015-04-04: qty 1000

## 2015-04-04 MED ORDER — ACETAMINOPHEN 325 MG PO TABS
ORAL_TABLET | ORAL | Status: AC
  Filled 2015-04-04: qty 2

## 2015-04-04 MED ORDER — LIDOCAINE-PRILOCAINE 2.5-2.5 % EX CREA: 1.0000 "application " | TOPICAL_CREAM | CUTANEOUS | Status: DC | PRN

## 2015-04-04 MED ORDER — HEPARIN SOD (PORK) LOCK FLUSH 100 UNIT/ML IV SOLN
500.0000 [IU] | Freq: Once | INTRAVENOUS | Status: AC | PRN
  Administered 2015-04-04: 500 [IU]
  Filled 2015-04-04: qty 5

## 2015-04-04 MED ORDER — ZOLPIDEM TARTRATE 5 MG PO TABS: 5.0000 mg | ORAL_TABLET | Freq: Every evening | ORAL | Status: DC | PRN

## 2015-04-04 MED ORDER — TRASTUZUMAB CHEMO INJECTION 440 MG
8.0000 mg/kg | Freq: Once | INTRAVENOUS | Status: AC
  Administered 2015-04-04: 672 mg via INTRAVENOUS
  Filled 2015-04-04: qty 32

## 2015-04-04 MED ORDER — DIPHENHYDRAMINE HCL 25 MG PO CAPS
ORAL_CAPSULE | ORAL | Status: AC
  Filled 2015-04-04: qty 2

## 2015-04-04 MED ORDER — VANCOMYCIN HCL IN DEXTROSE 1-5 GM/200ML-% IV SOLN
1000.0000 mg | Freq: Once | INTRAVENOUS | Status: AC
  Administered 2015-04-04: 1000 mg via INTRAVENOUS
  Filled 2015-04-04: qty 200

## 2015-04-04 MED ORDER — ACETAMINOPHEN 325 MG PO TABS
650.0000 mg | ORAL_TABLET | Freq: Once | ORAL | Status: AC
Start: 2015-04-04 — End: 2015-04-04
  Administered 2015-04-04: 650 mg via ORAL

## 2015-04-04 NOTE — Addendum Note (Signed)
Addended by: Prentiss Bells on: 04/04/2015 01:05 PM   Modules accepted: Medications

## 2015-04-04 NOTE — Telephone Encounter (Signed)
Gave patient avs report and appointments for October and November.  °

## 2015-04-04 NOTE — Patient Instructions (Signed)
[facility] Discharge Instructions for Patients Receiving Chemotherapy  Today you received the following chemotherapy agents Herceptin To help prevent nausea and vomiting after your treatment, we encourage you to take your nausea medication as prescribed.  If you develop nausea and vomiting that is not controlled by your nausea medication, call the clinic.   BELOW ARE SYMPTOMS THAT SHOULD BE REPORTED IMMEDIATELY:  *FEVER GREATER THAN 100.5 F  *CHILLS WITH OR WITHOUT FEVER  NAUSEA AND VOMITING THAT IS NOT CONTROLLED WITH YOUR NAUSEA MEDICATION  *UNUSUAL SHORTNESS OF BREATH  *UNUSUAL BRUISING OR BLEEDING  TENDERNESS IN MOUTH AND THROAT WITH OR WITHOUT PRESENCE OF ULCERS  *URINARY PROBLEMS  *BOWEL PROBLEMS  UNUSUAL RASH Items with * indicate a potential emergency and should be followed up as soon as possible.  Feel free to call the clinic you have any questions or concerns. The clinic phone number is [phone].  Please show the [facility] at check-in to the Emergency Department and triage nurse.   Goserelin injection (Zoladex) What is this medicine? GOSERELIN (GOE se rel in) is similar to a hormone found in the body. It lowers the amount of sex hormones that the body makes. Men will have lower testosterone levels and women will have lower estrogen levels while taking this medicine. In men, this medicine is used to treat prostate cancer; the injection is either given once per month or once every 12 weeks. A once per month injection (only) is used to treat women with endometriosis, dysfunctional uterine bleeding, or advanced breast cancer. This medicine may be used for other purposes; ask your health care provider or pharmacist if you have questions. What should I tell my health care provider before I take this medicine? They need to know if you have any of these conditions (some only apply to women): -diabetes -heart disease or previous heart attack -high  blood pressure -high cholesterol -kidney disease -osteoporosis or low bone density -problems passing urine -spinal cord injury -stroke -tobacco smoker -an unusual or allergic reaction to goserelin, hormone therapy, other medicines, foods, dyes, or preservatives -pregnant or trying to get pregnant -breast-feeding How should I use this medicine? This medicine is for injection under the skin. It is given by a health care professional in a hospital or clinic setting. Men receive this injection once every 4 weeks or once every 12 weeks. Women will only receive the once every 4 weeks injection. Talk to your pediatrician regarding the use of this medicine in children. Special care may be needed. Overdosage: If you think you have taken too much of this medicine contact a poison control center or emergency room at once. NOTE: This medicine is only for you. Do not share this medicine with others. What if I miss a dose? It is important not to miss your dose. Call your doctor or health care professional if you are unable to keep an appointment. What may interact with this medicine? -female hormones like estrogen -herbal or dietary supplements like black cohosh, chasteberry, or DHEA -female hormones like testosterone -prasterone This list may not describe all possible interactions. Give your health care provider a list of all the medicines, herbs, non-prescription drugs, or dietary supplements you use. Also tell them if you smoke, drink alcohol, or use illegal drugs. Some items may interact with your medicine. What should I watch for while using this medicine? Visit your doctor or health care professional for regular checks on your progress. Your symptoms may appear to get worse during  the first weeks of this therapy. Tell your doctor or healthcare professional if your symptoms do not start to get better or if they get worse after this time. Your bones may get weaker if you take this medicine for a long  time. If you smoke or frequently drink alcohol you may increase your risk of bone loss. A family history of osteoporosis, chronic use of drugs for seizures (convulsions), or corticosteroids can also increase your risk of bone loss. Talk to your doctor about how to keep your bones strong. This medicine should stop regular monthly menstration in women. Tell your doctor if you continue to [facility]. Women should not become pregnant while taking this medicine or for 12 weeks after stopping this medicine. Women should inform their doctor if they wish to become pregnant or think they might be pregnant. There is a potential for serious side effects to an unborn child. Talk to your health care professional or pharmacist for more information. Do not breast-feed an infant while taking this medicine. Men should inform their doctors if they wish to father a child. This medicine may lower sperm counts. Talk to your health care professional or pharmacist for more information. What side effects may I notice from receiving this medicine? Side effects that you should report to your doctor or health care professional as soon as possible: -allergic reactions like skin rash, itching or hives, swelling of the face, lips, or tongue -bone pain -breathing problems -changes in vision -chest pain -feeling faint or lightheaded, falls -fever, chills -pain, swelling, warmth in the leg -pain, tingling, numbness in the hands or feet -signs and symptoms of low blood pressure like dizziness; feeling faint or lightheaded, falls; unusually weak or tired -stomach pain -swelling of the ankles, feet, hands -trouble passing urine or change in the amount of urine -unusually high or low blood pressure -unusually weak or tired Side effects that usually do not require medical attention (report to your doctor or health care professional if they continue or are bothersome): -change in sex drive or performance -changes in breast size in  both males and females -changes in emotions or moods -headache -hot flashes -irritation at site where injected -loss of appetite -skin problems like acne, dry skin -vaginal dryness This list may not describe all possible side effects. Call your doctor for medical advice about side effects. You may report side effects to FDA at 1-800-FDA-1088. Where should I keep my medicine? This drug is given in a hospital or clinic and will not be stored at home. NOTE: This sheet is a summary. It may not cover all possible information. If you have questions about this medicine, talk to your doctor, pharmacist, or health care provider.    2016, Elsevier/Gold Standard. (2013-08-11 11:10:35)   Vancomycin injection What is this medicine? VANCOMYCIN Lucianne Lei koe MYE sin) is a glycopeptide antibiotic. It is used to treat certain kinds of bacterial infections. It will not work for colds, flu, or other viral infections. This medicine may be used for other purposes; ask your health care provider or pharmacist if you have questions. What should I tell my health care provider before I take this medicine? They need to know if you have any of these conditions: -dehydration -hearing loss -kidney disease -other chronic illness -an unusual or allergic reaction to vancomycin, other medicines, foods, dyes, or preservatives -pregnant or trying to get pregnant -breast-feeding How should I use this medicine? This medicine is infused into a vein. It is usually given by a health  care provider in a hospital or clinic. If you receive this medicine at home, you will receive special instructions. Take your medicine at regular intervals. Do not take your medicine more often than directed. Take all of your medicine as directed even if you think you are better. Do not skip doses or stop your medicine early. It is important that you put your used needles and syringes in a special sharps container. Do not put them in a trash can. If  you do not have a sharps container, call your pharmacist or healthcare provider to get one. Talk to your pediatrician regarding the use of this medicine in children. While this drug may be prescribed for even very young infants for selected conditions, precautions do apply. Overdosage: If you think you have taken too much of this medicine contact a poison control center or emergency room at once. NOTE: This medicine is only for you. Do not share this medicine with others. What if I miss a dose? If you miss a dose, take it as soon as you can. If it is almost time for your next dose, take only that dose. Do not take double or extra doses. What may interact with this medicine? -amphotericin B -anesthetics -bacitracin -birth control pills -cisplatin -colistin -diuretics -other aminoglycoside antibiotics -polymyxin B This list may not describe all possible interactions. Give your health care provider a list of all the medicines, herbs, non-prescription drugs, or dietary supplements you use. Also tell them if you smoke, drink alcohol, or use illegal drugs. Some items may interact with your medicine. What should I watch for while using this medicine? Tell your doctor or health care professional if your symptoms do not improve or if you get new symptoms. Your condition and lab work will be monitored while you are taking this medicine. Do not treat diarrhea with over the counter products. Contact your doctor if you have diarrhea that lasts more than 2 days or if it is severe and watery. What side effects may I notice from receiving this medicine? Side effects that you should report to your doctor or health care professional as soon as possible: -allergic reactions like skin rash, itching or hives, swelling of the face, lips, or tongue -breathing difficulty, wheezing -change in amount, color of urine -change in hearing -chest pain -dizziness -fever, chills -flushing of the face and neck  (reddening) -low blood pressure -redness, blistering, peeling or loosening of the skin, including inside the mouth -unusual bleeding or bruising -unusually weak or tired Side effects that usually do not require medical attention (report to your doctor or health care professional if they continue or are bothersome): -nausea, vomiting -pain, swelling where injected -stomach cramps This list may not describe all possible side effects. Call your doctor for medical advice about side effects. You may report side effects to FDA at 1-800-FDA-1088. Where should I keep my medicine? Keep out of the reach of children. You will be instructed on how to store this medicine, if needed. Throw away any unused medicine after the expiration date on the label. NOTE: This sheet is a summary. It may not cover all possible information. If you have questions about this medicine, talk to your doctor, pharmacist, or health care provider.    2016, Elsevier/Gold Standard. (2013-01-09 14:46:02)

## 2015-04-04 NOTE — Progress Notes (Signed)
Patient Care Team: Loraine Leriche, MD as PCP - General (Internal Medicine)  DIAGNOSIS: No matching staging information was found for the patient.  SUMMARY OF ONCOLOGIC HISTORY:   Breast cancer of upper-outer quadrant of left female breast ([place])   08/27/2014 Initial Diagnosis Excisional biopsy: 2 lumps showing 1.5 cm and 0.9 cm IDC, Grade 3, ER Pos, PR Neg and HER-2 amplified Ratio 2.6, Multifocal   09/27/2014 - 01/10/2015 Neo-Adjuvant Chemotherapy Neoadjuvant TCH Perjeta every 3 week 6 followed by Herceptin maintenance   01/13/2015 Breast MRI Postsurgical changes in left breast without residual enhancing masses compatible with treatment response   02/10/2015 Surgery right mastectomy: No malignancy, 0/3 sentinel nodes, complete response to chemotherapy    CHIEF COMPLIANT: Right breast cellulitis  INTERVAL HISTORY: Natasha Torres is a 34 year old with above-mentioned history of left breast cancer who is here to resume Herceptin maintenance. She noticed that the right breast was entered with a mattress on Friday she started having achiness and feeling ill that went on to Saturday. She was prescribed oral Bactrim. She started to feel better. The breast still appears to be red, although her overall symptoms have improved. She started on tamoxifen and has occasional hot flashes. She had one episode of drenching night sweats.  REVIEW OF SYSTEMS:   Constitutional: Denies fevers, chills or abnormal weight loss Eyes: Denies blurriness of vision Ears, nose, mouth, throat, and face: Denies mucositis or sore throat Respiratory: Denies cough, dyspnea or wheezes Cardiovascular: Denies palpitation, chest discomfort or lower extremity swelling Gastrointestinal:  Denies nausea, heartburn or change in bowel habits Skin: Denies abnormal skin rashes Lymphatics: Denies new lymphadenopathy or easy bruising Neurological:Denies numbness, tingling or new weaknesses Behavioral/Psych: Mood is stable, no new  changes  Breast: Right breast redness All other systems were reviewed with the patient and are negative.  I have reviewed the past medical history, past surgical history, social history and family history with the patient and they are unchanged from previous note.  ALLERGIES:  is allergic to buprenorphine hcl; morphine and related; and penicillins.  MEDICATIONS:  Current Outpatient Prescriptions  Medication Sig Dispense Refill  . BIOTIN PO Take 2 tablets by mouth daily.    . candesartan (ATACAND) 4 MG tablet Take 1 tablet (4 mg total) by mouth every evening. 30 tablet 3  . carvedilol (COREG) 6.25 MG tablet Take 1 tablet (6.25 mg total) by mouth 2 (two) times daily with a meal. 60 tablet 3  . docusate sodium (COLACE) 100 MG capsule Take 100 mg by mouth daily as needed for mild constipation.    [name] ibuprofen (ADVIL,MOTRIN) 200 MG tablet Take 200-400 mg by mouth every 6 (six) hours as needed for headache, mild pain or moderate pain.    [name] loperamide (IMODIUM) 2 MG capsule Take 2 mg by mouth as needed for diarrhea or loose stools.    [name] LORazepam (ATIVAN) 0.5 MG tablet TAKE 1 TABLET BY MOUTH EVERY 8 HOURS AS NEEDED 60 tablet 0  . Melatonin 10 MG CAPS Take 10 mg by mouth.    . Multiple Vitamin (MULTIVITAMIN) tablet Take 2 tablets by mouth daily.     . ondansetron (ZOFRAN) 4 MG tablet Take 4 mg by mouth every 8 (eight) hours as needed for nausea or vomiting.    [name] oxyCODONE (OXY IR/ROXICODONE) 5 MG immediate release tablet Take 1-2 tablets (5-10 mg total) by mouth every 4 (four) hours as needed for moderate pain. 50 tablet 0  . Probiotic Product (PROBIOTIC DAILY PO) Take 1  capsule by mouth daily. Ultimate Flora    . prochlorperazine (COMPAZINE) 10 MG tablet Take 1 tablet (10 mg total) by mouth every 6 (six) hours as needed (Nausea or vomiting). 30 tablet 1  . tamoxifen (NOLVADEX) 20 MG tablet Take 1 tablet (20 mg total) by mouth daily. 90 tablet 3  . venlafaxine XR (EFFEXOR-XR) 37.5 MG 24 hr capsule  Take 1 capsule (37.5 mg total) by mouth daily with breakfast. 30 capsule 6  . zolpidem (AMBIEN) 5 MG tablet Take 5 mg by mouth at bedtime as needed for sleep.     No current facility-administered medications for this visit.    PHYSICAL EXAMINATION: ECOG PERFORMANCE STATUS: 1 - Symptomatic but completely ambulatory  Filed Vitals:   04/04/15 1000  BP: 101/66  Pulse: 81  Temp: 98.2 F (36.8 C)  Resp: 18   Filed Weights   04/04/15 1000  Weight: 182 lb 6.4 oz (82.736 kg)    GENERAL:alert, no distress and comfortable SKIN: skin color, texture, turgor are normal, no rashes or significant lesions EYES: normal, Conjunctiva are pink and non-injected, sclera clear OROPHARYNX:no exudate, no erythema and lips, buccal mucosa, and tongue normal  NECK: supple, thyroid normal size, non-tender, without nodularity LYMPH:  no palpable lymphadenopathy in the cervical, axillary or inguinal LUNGS: clear to auscultation and percussion with normal breathing effort HEART: regular rate & rhythm and no murmurs and no lower extremity edema ABDOMEN:abdomen soft, non-tender and normal bowel sounds Musculoskeletal:no cyanosis of digits and no clubbing  NEURO: alert & oriented x 3 with fluent speech, no focal motor/sensory deficits BREAST: Redness in the right breast with mild tenderness which has improved since Saturday.. (exam performed in the presence of a chaperone)  LABORATORY DATA:  I have reviewed the data as listed   Chemistry      Component Value Date/Time   NA 141 04/04/2015 0944   NA 133* 02/27/2015 1135   K 4.7 04/04/2015 0944   K 3.7 02/27/2015 1135   CL 99* 02/27/2015 1135   CO2 26 04/04/2015 0944   CO2 25 02/27/2015 1135   BUN 11.0 04/04/2015 0944   BUN 16 02/27/2015 1135   CREATININE 0.8 04/04/2015 0944   CREATININE 0.81 02/27/2015 1135      Component Value Date/Time   CALCIUM 9.7 04/04/2015 0944   CALCIUM 8.8* 02/27/2015 1135   ALKPHOS 66 04/04/2015 0944   ALKPHOS 41  02/27/2015 1135   AST 17 04/04/2015 0944   AST 24 02/27/2015 1135   ALT 11 04/04/2015 0944   ALT 13* 02/27/2015 1135   BILITOT <0.30 04/04/2015 0944   BILITOT 0.5 02/27/2015 1135       Lab Results  Component Value Date   WBC 5.4 04/04/2015   HGB 10.2* 04/04/2015   HCT 31.3* 04/04/2015   MCV 90.7 04/04/2015   PLT 183 04/04/2015   NEUTROABS 3.7 04/04/2015   ASSESSMENT & PLAN:  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 Biopsy of the right breast mass and left axillary lymph node and the secondary mass left breast back as benign. Status post neo-adjuvant TCH Perjeta 6 cycles from 09/27/2014 to 01/10/2015 Followed by bilateral mastectomies  on 02/10/2015: No malignancy in either breast  Plan: 1. Start maintenance Herceptin to complete 1 year of therapy. Previously Herceptin is on hold because of decline in ejection fraction. Echocardiogram on 02/03/2015 showed an ejection fraction of 45%. This has dropped from 55% previously. Dr. Kirk Ruths is monitoring her. She will resume herceptin for 2 cycles and then she will have an ECHO. 2. Started anti-estrogen therapy with tamoxifen with Zoladex for ovarian suppression.  Tamoxifen and Zoladex toxicities: 1. Hot flashes: Minimal, one episode of severe sweats: Patient is on Effexor which appears to be helping. 2. Mild emotional changes and myalgias  We once again discussed that antiestrogen therapy with anastrozole in combination with ovarian suppression is another alternative. My concern is that ovarian suppression can sometimes fluctuate and anastrozole may not help prevent  breast cancer recurrence as effectively as tamoxifen would in that situation. If patient does get oophorectomy, we could consider anastrozole therapy.  Right breast cellulitis: Patient was started on Bactrim on Friday. She feels a lot better. She denies any fevers or chills today. I called and discussed the case with Dr. Leland Johns. We will obtain blood cultures today. We will give her 1 g of vancomycin IV. She will follow with Dr. Leland Johns  next week.   Tamoxifen toxicities: 1. Hot flashes I renewed her prescription for Ambien, EMLA cream, provided prescription for lymphedema sleeve. Return to clinic in 3 weeks for follow-up  No orders of the defined types were placed in this encounter.   The patient has a good understanding of the overall plan. she agrees with it. she will call with any problems that may develop before the next visit here.   Rulon Eisenmenger, MD 04/04/2015

## 2015-04-04 NOTE — Progress Notes (Signed)
Zoladex injection given by infusion nurse after chemo.

## 2015-04-04 NOTE — Telephone Encounter (Signed)
Appointments made and avs will be printed in chemo  °

## 2015-04-05 ENCOUNTER — Ambulatory Visit: Payer: Self-pay

## 2015-04-06 ENCOUNTER — Other Ambulatory Visit: Payer: Self-pay | Admitting: *Deleted

## 2015-04-06 ENCOUNTER — Telehealth: Payer: Self-pay | Admitting: *Deleted

## 2015-04-06 ENCOUNTER — Other Ambulatory Visit: Payer: Self-pay

## 2015-04-06 ENCOUNTER — Ambulatory Visit: Payer: Managed Care, Other (non HMO)

## 2015-04-06 DIAGNOSIS — C50412 Malignant neoplasm of upper-outer quadrant of left female breast: Secondary | ICD-10-CM

## 2015-04-06 MED ORDER — VANCOMYCIN HCL 1000 MG IV SOLR: 1000.0000 mg | Freq: Once | INTRAVENOUS | Status: DC

## 2015-04-06 MED ORDER — SULFAMETHOXAZOLE-TRIMETHOPRIM 800-160 MG PO TABS: 1.0000 | ORAL_TABLET | Freq: Two times a day (BID) | ORAL | Status: AC

## 2015-04-06 NOTE — Telephone Encounter (Signed)
Pt called to relay she still has redness on her right breast and fluid around the expander. Discussed with Dr. Lindi Adie. Received orders for pt to return to [facility] tomorrow to receive IV vancomycin infusion and to start Bactrim DS x 5 days Reviewed instructions and Dr. Geralyn Flash recommendations with pt. Pt will return for infusion on 10/20 at 3:15. Denies further needs at this time.

## 2015-04-07 ENCOUNTER — Ambulatory Visit (HOSPITAL_BASED_OUTPATIENT_CLINIC_OR_DEPARTMENT_OTHER): Payer: Managed Care, Other (non HMO)

## 2015-04-07 VITALS — BP 122/67 | HR 69 | Temp 98.5°F | Resp 18

## 2015-04-07 DIAGNOSIS — C50512 Malignant neoplasm of lower-outer quadrant of left female breast: Secondary | ICD-10-CM

## 2015-04-07 DIAGNOSIS — C50412 Malignant neoplasm of upper-outer quadrant of left female breast: Secondary | ICD-10-CM | POA: Diagnosis not present

## 2015-04-07 DIAGNOSIS — N61 Mastitis without abscess: Secondary | ICD-10-CM

## 2015-04-07 MED ORDER — HEPARIN SOD (PORK) LOCK FLUSH 100 UNIT/ML IV SOLN
500.0000 [IU] | Freq: Once | INTRAVENOUS | Status: AC
  Administered 2015-04-07: 500 [IU] via INTRAVENOUS
  Filled 2015-04-07: qty 5

## 2015-04-07 MED ORDER — VANCOMYCIN HCL 1000 MG IV SOLR
1000.0000 mg | Freq: Once | INTRAVENOUS | Status: AC
  Administered 2015-04-07: 1000 mg via INTRAVENOUS
  Filled 2015-04-07: qty 1000

## 2015-04-07 MED ORDER — SODIUM CHLORIDE 0.9 % IJ SOLN
10.0000 mL | INTRAMUSCULAR | Status: DC | PRN
  Administered 2015-04-07: 10 mL via INTRAVENOUS
  Filled 2015-04-07: qty 10

## 2015-04-07 MED ORDER — VANCOMYCIN HCL 1000 MG IV SOLR
1000.0000 mg | Freq: Once | INTRAVENOUS | Status: DC
  Filled 2015-04-07: qty 1000

## 2015-04-07 NOTE — Patient Instructions (Signed)
Vancomycin injection What is this medicine? VANCOMYCIN (van koe MYE sin) is a glycopeptide antibiotic. It is used to treat certain kinds of bacterial infections. It will not work for colds, flu, or other viral infections. This medicine may be used for other purposes; ask your health care provider or pharmacist if you have questions. What should I tell my health care provider before I take this medicine? They need to know if you have any of these conditions: -dehydration -hearing loss -kidney disease -other chronic illness -an unusual or allergic reaction to vancomycin, other medicines, foods, dyes, or preservatives -pregnant or trying to get pregnant -breast-feeding How should I use this medicine? This medicine is infused into a vein. It is usually given by a health care provider in a hospital or clinic. If you receive this medicine at home, you will receive special instructions. Take your medicine at regular intervals. Do not take your medicine more often than directed. Take all of your medicine as directed even if you think you are better. Do not skip doses or stop your medicine early. It is important that you put your used needles and syringes in a special sharps container. Do not put them in a trash can. If you do not have a sharps container, call your pharmacist or healthcare provider to get one. Talk to your pediatrician regarding the use of this medicine in children. While this drug may be prescribed for even very young infants for selected conditions, precautions do apply. Overdosage: If you think you have taken too much of this medicine contact a poison control center or emergency room at once. NOTE: This medicine is only for you. Do not share this medicine with others. What if I miss a dose? If you miss a dose, take it as soon as you can. If it is almost time for your next dose, take only that dose. Do not take double or extra doses. What may interact with this  medicine? -amphotericin B -anesthetics -bacitracin -birth control pills -cisplatin -colistin -diuretics -other aminoglycoside antibiotics -polymyxin B This list may not describe all possible interactions. Give your health care provider a list of all the medicines, herbs, non-prescription drugs, or dietary supplements you use. Also tell them if you smoke, drink alcohol, or use illegal drugs. Some items may interact with your medicine. What should I watch for while using this medicine? Tell your doctor or health care professional if your symptoms do not improve or if you get new symptoms. Your condition and lab work will be monitored while you are taking this medicine. Do not treat diarrhea with over the counter products. Contact your doctor if you have diarrhea that lasts more than 2 days or if it is severe and watery. What side effects may I notice from receiving this medicine? Side effects that you should report to your doctor or health care professional as soon as possible: -allergic reactions like skin rash, itching or hives, swelling of the face, lips, or tongue -breathing difficulty, wheezing -change in amount, color of urine -change in hearing -chest pain -dizziness -fever, chills -flushing of the face and neck (reddening) -low blood pressure -redness, blistering, peeling or loosening of the skin, including inside the mouth -unusual bleeding or bruising -unusually weak or tired Side effects that usually do not require medical attention (report to your doctor or health care professional if they continue or are bothersome): -nausea, vomiting -pain, swelling where injected -stomach cramps This list may not describe all possible side effects. Call your doctor for   medical advice about side effects. You may report side effects to FDA at 1-800-FDA-1088. Where should I keep my medicine? Keep out of the reach of children. You will be instructed on how to store this medicine, if  needed. Throw away any unused medicine after the expiration date on the label. NOTE: This sheet is a summary. It may not cover all possible information. If you have questions about this medicine, talk to your doctor, pharmacist, or health care provider.    2016, Elsevier/Gold Standard. (2013-01-09 14:46:02)  

## 2015-04-10 LAB — CULTURE, BLOOD (SINGLE)

## 2015-04-13 ENCOUNTER — Ambulatory Visit: Payer: Managed Care, Other (non HMO)

## 2015-04-13 DIAGNOSIS — M25611 Stiffness of right shoulder, not elsewhere classified: Secondary | ICD-10-CM

## 2015-04-13 DIAGNOSIS — M25612 Stiffness of left shoulder, not elsewhere classified: Secondary | ICD-10-CM

## 2015-04-13 DIAGNOSIS — Z9189 Other specified personal risk factors, not elsewhere classified: Secondary | ICD-10-CM

## 2015-04-13 DIAGNOSIS — Z9013 Acquired absence of bilateral breasts and nipples: Secondary | ICD-10-CM

## 2015-04-13 NOTE — Therapy (Signed)
[place], [place], [address] Phone: [phone]   Fax:  [phone]  Physical Therapy Treatment  Patient Details  Name: Natasha Torres MRN: [id number] Date of Birth: Jun 09, 1981 No Data Recorded  Encounter Date: 04/13/2015      PT End of Session - 04/13/15 1347    Visit Number 7   Number of Visits 24   Date for PT Re-Evaluation 05/09/15   PT Start Time 1304   PT Stop Time 1347   PT Time Calculation (min) 43 min   Activity Tolerance Patient tolerated treatment well   Behavior During Therapy [facility] for tasks assessed/performed      Past Medical History  Diagnosis Date  . GERD (gastroesophageal reflux disease)     notices with chemo but not a chronic problem  . Nocturia   . Anxiety     takes Lorazepam daily if needed  . Insomnia     takes Ambien nightly if needed  . Depression     hx  . Breast cancer ([place]) 2016    ER+/PR-/Her2+ "cancer in just the left breast" (02/10/2015)  . 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 Family history of adverse reaction to anesthesia     mom gets sick  . Wears glasses     Past Surgical History  Procedure Laterality Date  . Right oophorectomy  2012  . Wisdom tooth extraction  2001  . Orif finger / thumb fracture Right 1994    thumb  . Portacath placement  08/2014; 01/07/2015  . Complete mastectomy w/ sentinel node biopsy Left 02/10/2015  . Mastectomy Bilateral 02/10/2015  . Breast reconstruction with placement of tissue expander and flex hd (acellular hydrated dermis) Bilateral 02/10/2015  . Port-a-cath removal  01/07/2015  . Breast lumpectomy Left 08/2014  . Breast biopsy Bilateral 09/2014  . Nipple sparing mastectomy/sentinal lymph node biopsy/reconstruction/placement of tissue expander Bilateral 02/10/2015    Procedure: BILATERAL  NIPPLE SPARING MASTECTOMY WITH LEFT  SENTINAL LYMPH NODE BIOPSY(RIGHT BREAST PROPHYLACTIC);  Surgeon: Rolm Bookbinder, MD;  Location: [place];  Service: General;  Laterality: Bilateral;  . Breast reconstruction with placement of tissue expander and flex hd (acellular hydrated dermis) Bilateral 02/10/2015    Procedure: BREAST RECONSTRUCTION WITH PLACEMENT OF TISSUE EXPANDER AND FLEX HD (ACELLULAR HYDRATED DERMIS);  Surgeon: Irene Limbo, MD;  Location: [place];  Service: Plastics;  Laterality: Bilateral;  . Removal of tissue expander and placement of implant Right 02/27/2015    Procedure: REMOVAL OF TISSUE EXPANDER AND PLACEMENT OF NEW TISSUE EXPANDER;  Surgeon: Irene Limbo, MD;  Location: [place];  Service: Plastics;  Laterality: Right;  . Incision and drainage of wound Left 03/02/2015    Procedure: IRRIGATION BREAST POCKET AND EXCHANGE OF LEFT BREAST TISSUE EXPANDER;  Surgeon: Irene Limbo, MD;  Location: [place];  Service: Plastics;  Laterality: Left;  . Tissue expander placement Left 03/02/2015    Procedure: TISSUE EXPANDER;  Surgeon: Irene Limbo, MD;  Location: [place];  Service: Plastics;  Laterality: Left;    There were no vitals filed for this visit.  Visit Diagnosis:  Shoulder stiffness, left  Shoulder stiffness, right  Status post mastectomy, bilateral  At risk for lymphedema      Subjective Assessment - 04/13/15 1309    Subjective Had to cancel my appts last week due to getting another infection in my Rt breast Friday 10/14 and have received 2 antibiotic injections since then, they removed 2 vials  of fluid from my breast and I'm taking Bactrim again. My Rt breast feels fine today, no pain at all, just some slight redness at inferior area and dryness.   Currently in Pain? No/denies            [facility] PT Assessment - 04/13/15 0001    AROM   Right Shoulder Flexion 152 Degrees   Left Shoulder Flexion 158 Degrees                     [facility] Adult PT Treatment/Exercise - 04/13/15 0001    Shoulder Exercises: Standing   Other Standing Exercises All stretches of  Strength ABC Program 1 rep 15 second holds each.    Other Standing Exercises All Strength exercises from Strength ABC Program 5-10 reps each, pt returned excellent demo.                 PT Education - 04/13/15 1347    Education provided Yes   Education Details Strength ABC Program   Person(s) Educated Patient   Methods Explanation;Demonstration;Handout   Comprehension Verbalized understanding;Returned demonstration           Short Term Clinic Goals - 03/28/15 1157    CC Short Term Goal  #1   Title Patient will be able to perform initial home exercise  program to increase shoulder mobility.   Status Achieved   CC Short Term Goal  #2   Title Patient will be able to increase bilateral shoulder flexion to >/= 155 degrees for increased ease reaching overhead   Status Achieved   CC Short Term Goal  #3   Title Patient will be able to increase bilateral shoulder abduction to >/= 155 degrees for increased ease reaching overhead.   Status Achieved   CC Short Term Goal  #4   Title Patient will be able to verbalize understanding of risk reduction practices for lymphedema.   Status On-going             Long Term Clinic Goals - 04/13/15 1351    CC Long Term Goal  #1   Title Patient will be able to increase bilateral shoulder flexion to >/= 160 degrees for increased ease reaching overhead  Rt 152 and Lt 158 degrees 04/13/15   Status On-going   CC Long Term Goal  #3   Title Patient will be able to verbalize and demonstrate understanding of a safe self-progression of her home exercise program  Instructed in Strength ABC Program today and will speak to her doctor tomorrow about what weight she is okay to use.    Status Partially Met   CC Long Term Goal  #4   Title Patient will be able to return to >/= 75% of her normal household tasks   Status Partially Met            Plan - 04/13/15 1348    Clinical Impression Statement Pt did very well with instruction of Strength  ABC Program. Did not use any weights today as pt had recent infection in Rt breast but has been on antibiotics for over a week now and felt okay throughout session today. Her AROM is slightly less on Rt side where she had infection, but Lt side flexion was improved.    Pt will benefit from skilled therapeutic intervention in order to improve on the following deficits Decreased strength;Impaired UE functional use;Pain;Decreased range of motion;Decreased knowledge of precautions   Rehab Potential Excellent   Clinical Impairments Affecting Rehab  Potential Recent infections in breasts from reconstruction; and then another infection in Rt breast 04/01/15 that required 2 IV antibiotics and pt back on Bactrim   PT Frequency 2x / week   PT Duration 8 weeks   PT Treatment/Interventions Therapeutic exercise;Manual techniques;Patient/family education;Passive range of motion;ADLs/Self Care Home Management   PT Next Visit Plan Remeasure ROM and assess goals next visit. Cont PROM, ROM exercises and progress HEP. Instruct in Strength ABC Program.   PT Home Exercise Plan Strenght ABC Program   Consulted and Agree with Plan of Care Patient        Problem List Patient Active Problem List   Diagnosis Date Noted  . Lymphedema 04/04/2015  . Acquired absence of breast and nipple 02/27/2015  . Breast cancer, left breast ([place]) 02/10/2015  . S/P bilateral mastectomy 02/10/2015  . Nonischemic cardiomyopathy ([place]) 01/18/2015  . Esophagitis 10/25/2014  . Pharyngitis 10/15/2014  . Constipation 10/15/2014  . 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 Breast cancer of upper-outer quadrant of left female breast ([place]) 09/14/2014  . SVD (spontaneous vaginal delivery) 12/03/2011    Otelia Limes, PTA 04/13/2015, 1:52 PM  [place], [place], [address] Phone: [phone]   Fax:  [phone]  Name: Natasha Torres MRN: [id number] Date of Birth: 16-Mar-1981

## 2015-04-18 ENCOUNTER — Other Ambulatory Visit: Payer: Self-pay | Admitting: *Deleted

## 2015-04-18 DIAGNOSIS — C50919 Malignant neoplasm of unspecified site of unspecified female breast: Secondary | ICD-10-CM

## 2015-04-18 MED ORDER — VENLAFAXINE HCL ER 75 MG PO CP24: 75.0000 mg | ORAL_CAPSULE | Freq: Every day | ORAL | Status: DC

## 2015-04-20 ENCOUNTER — Ambulatory Visit: Payer: Managed Care, Other (non HMO) | Attending: General Surgery

## 2015-04-20 DIAGNOSIS — M25611 Stiffness of right shoulder, not elsewhere classified: Secondary | ICD-10-CM | POA: Diagnosis present

## 2015-04-20 DIAGNOSIS — Z9013 Acquired absence of bilateral breasts and nipples: Secondary | ICD-10-CM

## 2015-04-20 DIAGNOSIS — Z9189 Other specified personal risk factors, not elsewhere classified: Secondary | ICD-10-CM | POA: Diagnosis present

## 2015-04-20 DIAGNOSIS — M25612 Stiffness of left shoulder, not elsewhere classified: Secondary | ICD-10-CM

## 2015-04-20 NOTE — Therapy (Signed)
[place], [place], [address] Phone: [phone]   Fax:  [phone]  Physical Therapy Treatment  Patient Details  Name: Natasha Torres MRN: [id number] Date of Birth: 11/10/1980 No Data Recorded  Encounter Date: 04/20/2015      PT End of Session - 04/20/15 1035    Visit Number 8   Number of Visits 24   Date for PT Re-Evaluation 05/09/15   PT Start Time 0935   PT Stop Time 1017   PT Time Calculation (min) 42 min   Activity Tolerance Patient tolerated treatment well   Behavior During Therapy [facility] for tasks assessed/performed      Past Medical History  Diagnosis Date  . GERD (gastroesophageal reflux disease)     notices with chemo but not a chronic problem  . Nocturia   . Anxiety     takes Lorazepam daily if needed  . Insomnia     takes Ambien nightly if needed  . Depression     hx  . Breast cancer ([place]) 2016    ER+/PR-/Her2+ "cancer in just the left breast" (02/10/2015)  . 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 Family history of adverse reaction to anesthesia     mom gets sick  . Wears glasses     Past Surgical History  Procedure Laterality Date  . Right oophorectomy  2012  . Wisdom tooth extraction  2001  . Orif finger / thumb fracture Right 1994    thumb  . Portacath placement  08/2014; 01/07/2015  . Complete mastectomy w/ sentinel node biopsy Left 02/10/2015  . Mastectomy Bilateral 02/10/2015  . Breast reconstruction with placement of tissue expander and flex hd (acellular hydrated dermis) Bilateral 02/10/2015  . Port-a-cath removal  01/07/2015  . Breast lumpectomy Left 08/2014  . Breast biopsy Bilateral 09/2014  . Nipple sparing mastectomy/sentinal lymph node biopsy/reconstruction/placement of tissue expander Bilateral 02/10/2015    Procedure: BILATERAL  NIPPLE SPARING MASTECTOMY WITH LEFT  SENTINAL LYMPH NODE BIOPSY(RIGHT BREAST PROPHYLACTIC);  Surgeon: Rolm Bookbinder, MD;  Location: [place];  Service: General;  Laterality: Bilateral;  . Breast reconstruction with placement of tissue expander and flex hd (acellular hydrated dermis) Bilateral 02/10/2015    Procedure: BREAST RECONSTRUCTION WITH PLACEMENT OF TISSUE EXPANDER AND FLEX HD (ACELLULAR HYDRATED DERMIS);  Surgeon: Irene Limbo, MD;  Location: [place];  Service: Plastics;  Laterality: Bilateral;  . Removal of tissue expander and placement of implant Right 02/27/2015    Procedure: REMOVAL OF TISSUE EXPANDER AND PLACEMENT OF NEW TISSUE EXPANDER;  Surgeon: Irene Limbo, MD;  Location: [place];  Service: Plastics;  Laterality: Right;  . Incision and drainage of wound Left 03/02/2015    Procedure: IRRIGATION BREAST POCKET AND EXCHANGE OF LEFT BREAST TISSUE EXPANDER;  Surgeon: Irene Limbo, MD;  Location: [place];  Service: Plastics;  Laterality: Left;  . Tissue expander placement Left 03/02/2015    Procedure: TISSUE EXPANDER;  Surgeon: Irene Limbo, MD;  Location: [place];  Service: Plastics;  Laterality: Left;    There were no vitals filed for this visit.  Visit Diagnosis:  Shoulder stiffness, left  Shoulder stiffness, right  Status post mastectomy, bilateral  At risk for lymphedema      Subjective Assessment - 04/20/15 0938    Subjective Saw Dr. Iran Planas last Thursday and she had remove more fluid from my Rt breast and she was worried but then saw her again Monday and my breast looked great  so she is hopeful that I'm healing well. She said okay to do Strength ABC Program just no weights yet.    Currently in Pain? No/denies            [facility] PT Assessment - 04/20/15 0001    AROM   Right Shoulder Flexion 164 Degrees   Left Shoulder Flexion 161 Degrees                     [facility] Adult PT Treatment/Exercise - 04/20/15 0001    Shoulder Exercises: Standing   Other Standing Exercises Stretches from Strength ABC Program as follows: Bil chest, shoulder and tricep stretch     Shoulder Exercises: Therapy Ball   Flexion 10 reps  Roll yellow ball up wall   ABduction 10 reps  Roll yellow ball up wall   Shoulder Exercises: ROM/Strengthening   "W" Arms Standing against wall sliding arms up into "W" 5 reps for 5 second holds   Manual Therapy   Soft tissue mobilization To bil chest areas    Myofascial Release To bil chest wall in to horizontal and vertically on each side and UE pulling during PROM   Passive ROM In Supine bil shoulder flexion and D2                PT Education - 04/20/15 1032    Education provided Yes   Education Details Answered pts questions regarding progression of exrecises and importance of continuing ROM exercises until surgery.    Person(s) Educated Patient   Methods Explanation   Comprehension Verbalized understanding           Short Term Clinic Goals - 03/28/15 1157    CC Short Term Goal  #1   Title Patient will be able to perform initial home exercise  program to increase shoulder mobility.   Status Achieved   CC Short Term Goal  #2   Title Patient will be able to increase bilateral shoulder flexion to >/= 155 degrees for increased ease reaching overhead   Status Achieved   CC Short Term Goal  #3   Title Patient will be able to increase bilateral shoulder abduction to >/= 155 degrees for increased ease reaching overhead.   Status Achieved   CC Short Term Goal  #4   Title Patient will be able to verbalize understanding of risk reduction practices for lymphedema.   Status On-going             Long Term Clinic Goals - 04/20/15 [phone]    CC Long Term Goal  #1   Title Patient will be able to increase bilateral shoulder flexion to >/= 160 degrees for increased ease reaching overhead  Rt 164 and Lt 161 degrees 04/20/15   Status Achieved   CC Long Term Goal  #2   Title Patient will be able to increase bilateral shoulder abduction to >/= 160 degrees for increased ease reaching overhead   Status Achieved   CC Long  Term Goal  #3   Title Patient will be able to verbalize and demonstrate understanding of a safe self-progression of her home exercise program  Pt to begin [employer] in January   Status Achieved   CC Long Term Goal  #4   Title Patient will be able to return to >/= 75% of her normal household tasks  90% improvement with this, some limitations with yard work and mopping   Status Achieved  Plan - 04/20/15 1036    Clinical Impression Statement Ms. Wildermuth has done very well with therapy and has been compliant with HEP and is now looking forward to her upcoming reconstruction as she is feeling/doing better. 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 She has also met all goals including improving her AROM to WNLs, see measurements.   Pt will benefit from skilled therapeutic intervention in order to improve on the following deficits Decreased strength;Impaired UE functional use;Pain;Decreased range of motion;Decreased knowledge of precautions   Rehab Potential Excellent   Clinical Impairments Affecting Rehab Potential Recent infections in breasts from reconstruction; and then another infection in Rt breast 04/01/15 that required 2 IV antibiotics   PT Frequency 2x / week   PT Duration 8 weeks   PT Next Visit Plan Discharge this visit.   PT Home Exercise Plan Cont Strength ABC Program without weights until doctor okays weights again from recent infection, and pt to start [employer] in January.   Consulted and Agree with Plan of Care Patient        Problem List Patient Active Problem List   Diagnosis Date Noted  . Lymphedema 04/04/2015  . Acquired absence of breast and nipple 02/27/2015  . Breast cancer, left breast ([place]) 02/10/2015  . S/P bilateral mastectomy 02/10/2015  . Nonischemic cardiomyopathy ([place]) 01/18/2015  . Esophagitis 10/25/2014  . Pharyngitis 10/15/2014  . Constipation 10/15/2014  . Breast cancer associated with mutation in  ATM gene ([place])   . Breast cancer of upper-outer quadrant of left female breast ([place]) 09/14/2014  . SVD (spontaneous vaginal delivery) 12/03/2011   Collie Siad, PTA 04/20/2015 11:04 AM  [place], [place], [address] Phone: [phone]   Fax:  [phone]  Name: Natasha Torres MRN: [id number] Date of Birth: 1980-10-23   PHYSICAL THERAPY DISCHARGE SUMMARY  Visits from Start of Care: 8  Current functional level related to goals / functional outcomes: All goals met.  Patient doing very well.  See above for objective findings.   Remaining deficits: None except she is not permitted to use weights due to recent infection until cleared by her physician.   Education / Equipment: none    Plan: Patient agrees to discharge.  Patient goals were met. Patient is being discharged due to meeting the stated rehab goals.  ?????      Annia Friendly, [place] 04/20/2015 11:06 AM

## 2015-04-25 ENCOUNTER — Telehealth: Payer: Self-pay | Admitting: Hematology and Oncology

## 2015-04-25 ENCOUNTER — Encounter: Payer: Self-pay | Admitting: *Deleted

## 2015-04-25 ENCOUNTER — Other Ambulatory Visit (HOSPITAL_BASED_OUTPATIENT_CLINIC_OR_DEPARTMENT_OTHER): Payer: Managed Care, Other (non HMO)

## 2015-04-25 ENCOUNTER — Encounter: Payer: Self-pay | Admitting: Hematology and Oncology

## 2015-04-25 ENCOUNTER — Ambulatory Visit (HOSPITAL_BASED_OUTPATIENT_CLINIC_OR_DEPARTMENT_OTHER): Payer: Managed Care, Other (non HMO) | Admitting: Hematology and Oncology

## 2015-04-25 ENCOUNTER — Ambulatory Visit: Payer: Managed Care, Other (non HMO)

## 2015-04-25 ENCOUNTER — Ambulatory Visit (HOSPITAL_BASED_OUTPATIENT_CLINIC_OR_DEPARTMENT_OTHER): Payer: Managed Care, Other (non HMO)

## 2015-04-25 ENCOUNTER — Other Ambulatory Visit: Payer: Self-pay | Admitting: *Deleted

## 2015-04-25 VITALS — BP 102/56 | HR 72 | Temp 98.1°F | Resp 18 | Ht 71.0 in | Wt 181.4 lb

## 2015-04-25 DIAGNOSIS — C50412 Malignant neoplasm of upper-outer quadrant of left female breast: Secondary | ICD-10-CM

## 2015-04-25 DIAGNOSIS — N951 Menopausal and female climacteric states: Secondary | ICD-10-CM

## 2015-04-25 DIAGNOSIS — F329 Major depressive disorder, single episode, unspecified: Secondary | ICD-10-CM

## 2015-04-25 DIAGNOSIS — I428 Other cardiomyopathies: Secondary | ICD-10-CM

## 2015-04-25 DIAGNOSIS — Z5112 Encounter for antineoplastic immunotherapy: Secondary | ICD-10-CM | POA: Diagnosis not present

## 2015-04-25 DIAGNOSIS — D709 Neutropenia, unspecified: Secondary | ICD-10-CM | POA: Diagnosis not present

## 2015-04-25 LAB — COMPREHENSIVE METABOLIC PANEL (CC13)
ALBUMIN: 3.5 g/dL (ref 3.5–5.0)
ALT: 13 U/L (ref 0–55)
AST: 23 U/L (ref 5–34)
Alkaline Phosphatase: 64 U/L (ref 40–150)
Anion Gap: 7 mEq/L (ref 3–11)
BUN: 13.3 mg/dL (ref 7.0–26.0)
CHLORIDE: 109 meq/L (ref 98–109)
CO2: 24 mEq/L (ref 22–29)
CREATININE: 0.7 mg/dL (ref 0.6–1.1)
Calcium: 9 mg/dL (ref 8.4–10.4)
EGFR: >90 mL/min/{1.73_m2} (ref >90)
GLUCOSE: 94 mg/dL (ref 70–140)
POTASSIUM: 4.1 meq/L (ref 3.5–5.1)
SODIUM: 139 meq/L (ref 136–145)
Total Bilirubin: <0.3 mg/dL (ref 0.20–1.20)
Total Protein: 6.4 g/dL (ref 6.4–8.3)

## 2015-04-25 LAB — CBC WITH DIFFERENTIAL/PLATELET
BASO%: 1.4 % (ref 0.0–2.0)
Basophils Absolute: 0 10*3/uL (ref 0.0–0.1)
EOS ABS: 0.1 10*3/uL (ref 0.0–0.5)
EOS%: 3.4 % (ref 0.0–7.0)
HCT: 31.4 % — ABNORMAL LOW (ref 34.8–46.6)
HEMOGLOBIN: 10.2 g/dL — AB (ref 11.6–15.9)
LYMPH%: 38.3 % (ref 14.0–49.7)
MCH: 28.9 pg (ref 25.1–34.0)
MCHC: 32.4 g/dL (ref 31.5–36.0)
MCV: 89.3 fL (ref 79.5–101.0)
MONO#: 0.3 10*3/uL (ref 0.1–0.9)
MONO%: 9.9 % (ref 0.0–14.0)
NEUT%: 47 % (ref 38.4–76.8)
NEUTROS ABS: 1.2 10*3/uL — AB (ref 1.5–6.5)
Platelets: 173 10*3/uL (ref 145–400)
RBC: 3.51 10*6/uL — ABNORMAL LOW (ref 3.70–5.45)
RDW: 15.2 % — AB (ref 11.2–14.5)
WBC: 2.5 10*3/uL — AB (ref 3.9–10.3)
lymph#: 1 10*3/uL (ref 0.9–3.3)

## 2015-04-25 MED ORDER — DIPHENHYDRAMINE HCL 25 MG PO CAPS
50.0000 mg | ORAL_CAPSULE | Freq: Once | ORAL | Status: AC
  Administered 2015-04-25: 50 mg via ORAL

## 2015-04-25 MED ORDER — ACETAMINOPHEN 325 MG PO TABS
ORAL_TABLET | ORAL | Status: AC
  Filled 2015-04-25: qty 2

## 2015-04-25 MED ORDER — DIPHENHYDRAMINE HCL 25 MG PO CAPS
ORAL_CAPSULE | ORAL | Status: AC
  Filled 2015-04-25: qty 2

## 2015-04-25 MED ORDER — SODIUM CHLORIDE 0.9 % IJ SOLN
10.0000 mL | Freq: Once | INTRAMUSCULAR | Status: AC
  Administered 2015-04-25: 10 mL via INTRAVENOUS
  Filled 2015-04-25: qty 10

## 2015-04-25 MED ORDER — SODIUM CHLORIDE 0.9 % IV SOLN
Freq: Once | INTRAVENOUS | Status: AC
  Administered 2015-04-25: 12:00:00 via INTRAVENOUS

## 2015-04-25 MED ORDER — HEPARIN SOD (PORK) LOCK FLUSH 100 UNIT/ML IV SOLN
500.0000 [IU] | Freq: Once | INTRAVENOUS | Status: AC | PRN
  Administered 2015-04-25: 500 [IU]
  Filled 2015-04-25: qty 5

## 2015-04-25 MED ORDER — TRASTUZUMAB CHEMO INJECTION 440 MG
6.0000 mg/kg | Freq: Once | INTRAVENOUS | Status: AC
  Administered 2015-04-25: 504 mg via INTRAVENOUS
  Filled 2015-04-25: qty 24

## 2015-04-25 MED ORDER — ACETAMINOPHEN 325 MG PO TABS
650.0000 mg | ORAL_TABLET | Freq: Once | ORAL | Status: AC
Start: 2015-04-25 — End: 2015-04-25
  Administered 2015-04-25: 650 mg via ORAL

## 2015-04-25 MED ORDER — SODIUM CHLORIDE 0.9 % IJ SOLN
10.0000 mL | INTRAMUSCULAR | Status: DC | PRN
  Administered 2015-04-25: 10 mL
  Filled 2015-04-25: qty 10

## 2015-04-25 NOTE — Assessment & Plan Note (Signed)
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 Biopsy of the right breast mass and left axillary lymph node and the secondary mass left breast back as benign. Status post neo-adjuvant TCH [place] 6 cycles from 09/27/2014 to 01/10/2015 Followed by bilateral mastectomies on 02/10/2015: No malignancy in either breast ----------------------------------------------------------------------------------------------------------------------------------------------------- Plan: 1. On maintenance Herceptin to complete 1 year of therapy. Previously Herceptin is on hold because of decline in ejection fraction. Echocardiogram on 02/03/2015 showed an ejection fraction of 45%. This has dropped from 55% previously. Dr. Kirk Ruths is monitoring her. She will receive one more herceptin and then she will have an ECHO. 2. Started anti-estrogen therapy with tamoxifen with Zoladex for ovarian suppression on 02/22/15.  Tamoxifen and Zoladex toxicities: 1. Hot flashes: Minimal, one episode of severe sweats: Patient is on Effexor which appears to be helping. 2. Mild emotional changes and myalgias  We once again discussed that antiestrogen therapy with anastrozole in combination with ovarian suppression is another alternative. My concern is that ovarian suppression can sometimes fluctuate and anastrozole may not help prevent breast cancer recurrence as effectively as tamoxifen would in that situation. If patient does get oophorectomy, we could consider anastrozole therapy.  Right breast cellulitis:   Return to clinic in 6 weeks for follow-up and q 3 weeks for Herceptin (If her ECHO looks better)

## 2015-04-25 NOTE — Progress Notes (Signed)
Patient Care Team: Loraine Leriche, MD as PCP - General (Internal Medicine)  DIAGNOSIS: No matching staging information was found for the patient.  SUMMARY OF ONCOLOGIC HISTORY:   Breast cancer of upper-outer quadrant of left female breast ([place])   08/27/2014 Initial Diagnosis Excisional biopsy: 2 lumps showing 1.5 cm and 0.9 cm IDC, Grade 3, ER Pos, PR Neg and HER-2 amplified Ratio 2.6, Multifocal   09/27/2014 - 01/10/2015 Neo-Adjuvant Chemotherapy Neoadjuvant TCH Perjeta every 3 week 6 followed by Herceptin maintenance   01/13/2015 Breast MRI Postsurgical changes in left breast without residual enhancing masses compatible with treatment response   02/10/2015 Surgery right mastectomy: No malignancy, 0/3 sentinel nodes, complete response to chemotherapy   02/22/2015 -  Anti-estrogen oral therapy Zoladex with tamoxifen    CHIEF COMPLIANT: follow-up on Herceptin and antiestrogen therapy INTERVAL HISTORY: Natasha Torres is a 34 year old with above-mentioned history of HER-2 positive and estrogen receptor positive breast cancer treated with neoadjuvant chemotherapy with [place] and is currently on Herceptin maintenance as well as Zoladex with tamoxifen. She had a lot of hot flashes and emotional changes which have both been improved with Effexor 75 mg daily. She is going to get an echocardiogram in 3 weeks to evaluate heart function on Herceptin. If her ejection fraction declines, she may not be able to receive any further Herceptin treatments.  REVIEW OF SYSTEMS:   Constitutional: Denies fevers, chills or abnormal weight loss Eyes: Denies blurriness of vision Ears, nose, mouth, throat, and face: Denies mucositis or sore throat Respiratory: Denies cough, dyspnea or wheezes Cardiovascular: Denies palpitation, chest discomfort or lower extremity swelling Gastrointestinal:  Denies nausea, heartburn or change in bowel habits Skin: Denies abnormal skin rashes Lymphatics: Denies new  lymphadenopathy or easy bruising Neurological:Denies numbness, tingling or new weaknesses Behavioral/Psych: depression and mood swings  All other systems were reviewed with the patient and are negative.  I have reviewed the past medical history, past surgical history, social history and family history with the patient and they are unchanged from previous note.  ALLERGIES:  is allergic to buprenorphine hcl; morphine and related; and penicillins.  MEDICATIONS:  Current Outpatient Prescriptions  Medication Sig Dispense Refill  . BIOTIN PO Take 2 tablets by mouth daily.    . candesartan (ATACAND) 4 MG tablet Take 1 tablet (4 mg total) by mouth every evening. 30 tablet 3  . carvedilol (COREG) 6.25 MG tablet Take 1 tablet (6.25 mg total) by mouth 2 (two) times daily with a meal. 60 tablet 3  . docusate sodium (COLACE) 100 MG capsule Take 100 mg by mouth daily as needed for mild constipation.    [name] HYDROcodone-acetaminophen (NORCO/VICODIN) 5-325 MG tablet   0  . ibuprofen (ADVIL,MOTRIN) 200 MG tablet Take 200-400 mg by mouth every 6 (six) hours as needed for headache, mild pain or moderate pain.    [name] lidocaine-prilocaine (EMLA) cream Apply 1 application topically as needed. 30 g 6  . loperamide (IMODIUM) 2 MG capsule Take 2 mg by mouth as needed for diarrhea or loose stools.    [name] LORazepam (ATIVAN) 0.5 MG tablet TAKE 1 TABLET BY MOUTH EVERY 8 HOURS AS NEEDED 60 tablet 0  . Melatonin 10 MG CAPS Take 10 mg by mouth.    . Multiple Vitamin (MULTIVITAMIN) tablet Take 2 tablets by mouth daily.     . ondansetron (ZOFRAN) 4 MG tablet Take 4 mg by mouth every 8 (eight) hours as needed for nausea or vomiting.    . ondansetron (  ZOFRAN) 8 MG tablet   0  . oxyCODONE (OXY IR/ROXICODONE) 5 MG immediate release tablet Take 1-2 tablets (5-10 mg total) by mouth every 4 (four) hours as needed for moderate pain. 50 tablet 0  . Probiotic Product (PROBIOTIC DAILY PO) Take 1 capsule by mouth daily. Ultimate Flora    .  prochlorperazine (COMPAZINE) 10 MG tablet Take 1 tablet (10 mg total) by mouth every 6 (six) hours as needed (Nausea or vomiting). 30 tablet 1  . sulfamethoxazole-trimethoprim (BACTRIM DS,SEPTRA DS) 800-160 MG tablet     . tamoxifen (NOLVADEX) 20 MG tablet Take 1 tablet (20 mg total) by mouth daily. 90 tablet 3  . venlafaxine XR (EFFEXOR-XR) 37.5 MG 24 hr capsule Take 1 capsule (37.5 mg total) by mouth daily with breakfast. 30 capsule 6  . venlafaxine XR (EFFEXOR-XR) 75 MG 24 hr capsule Take 1 capsule (75 mg total) by mouth daily with breakfast. 30 capsule 6  . zolpidem (AMBIEN) 5 MG tablet Take 1 tablet (5 mg total) by mouth at bedtime as needed for sleep. 30 tablet 0   No current facility-administered medications for this visit.    PHYSICAL EXAMINATION: ECOG PERFORMANCE STATUS: 1 - Symptomatic but completely ambulatory  Filed Vitals:   04/25/15 1011  BP: 102/56  Pulse: 72  Temp: 98.1 F (36.7 C)  Resp: 18   Filed Weights   04/25/15 1011  Weight: 181 lb 6.4 oz (82.283 kg)    GENERAL:alert, no distress and comfortable SKIN: skin color, texture, turgor are normal, no rashes or significant lesions EYES: normal, Conjunctiva are pink and non-injected, sclera clear OROPHARYNX:no exudate, no erythema and lips, buccal mucosa, and tongue normal  NECK: supple, thyroid normal size, non-tender, without nodularity LYMPH:  no palpable lymphadenopathy in the cervical, axillary or inguinal LUNGS: clear to auscultation and percussion with normal breathing effort HEART: regular rate & rhythm and no murmurs and no lower extremity edema ABDOMEN:abdomen soft, non-tender and normal bowel sounds Musculoskeletal:no cyanosis of digits and no clubbing  NEURO: alert & oriented x 3 with fluent speech, no focal motor/sensory deficits  LABORATORY DATA:  I have reviewed the data as listed   Chemistry      Component Value Date/Time   NA 141 04/04/2015 0944   NA 133* 02/27/2015 1135   K 4.7 04/04/2015  0944   K 3.7 02/27/2015 1135   CL 99* 02/27/2015 1135   CO2 26 04/04/2015 0944   CO2 25 02/27/2015 1135   BUN 11.0 04/04/2015 0944   BUN 16 02/27/2015 1135   CREATININE 0.8 04/04/2015 0944   CREATININE 0.81 02/27/2015 1135      Component Value Date/Time   CALCIUM 9.7 04/04/2015 0944   CALCIUM 8.8* 02/27/2015 1135   ALKPHOS 66 04/04/2015 0944   ALKPHOS 41 02/27/2015 1135   AST 17 04/04/2015 0944   AST 24 02/27/2015 1135   ALT 11 04/04/2015 0944   ALT 13* 02/27/2015 1135   BILITOT <0.30 04/04/2015 0944   BILITOT 0.5 02/27/2015 1135       Lab Results  Component Value Date   WBC 2.5* 04/25/2015   HGB 10.2* 04/25/2015   HCT 31.4* 04/25/2015   MCV 89.3 04/25/2015   PLT 173 04/25/2015   NEUTROABS 1.2* 04/25/2015     RADIOGRAPHIC STUDIES: I have personally reviewed the radiology reports and agreed with their findings. No results found.   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 Biopsy of the right breast mass and left axillary lymph node and the secondary mass left breast back as benign. Status post neo-adjuvant TCH Perjeta 6 cycles from 09/27/2014 to 01/10/2015 Followed by bilateral mastectomies on 02/10/2015: No malignancy in either breast ----------------------------------------------------------------------------------------------------------------------------------------------------- Plan: 1. On maintenance Herceptin to complete 1 year of therapy. Previously Herceptin is on hold because of decline in ejection fraction. Echocardiogram on 02/03/2015 showed an ejection fraction of  45%. This has dropped from 55% previously. Dr. Kirk Ruths is monitoring her. She will receive one more herceptin and then she will have an ECHO. 2. Started anti-estrogen therapy with tamoxifen with Zoladex for ovarian suppression on 02/22/15.  Tamoxifen and Zoladex toxicities: 1. Hot flashes: Minimal, one episode of severe sweats: improved by Effexor 75 mg daily 2. Depression symptoms improved with Effexor. Patient is going to a lot of personal issues. She is getting a divorce.   Right breast cellulitis: resolved with 2 doses of vancomycin, 3 rounds of Bactrim and drainage of breast fluid. Neutropenia: 3 weeks ago her white count and neutrophil count were normal. I suspect this is because of Bactrim. We will recheck her blood counts in 3 weeks in follow-up.  Return to clinic in 3 weeks for follow-up and q 3 weeks for Herceptin (If her ECHO looks better). If the echocardiogram does not look better, we might have to discontinue her Herceptin treatments. If she does not receive Herceptin, then I would be seeing her every 3 months.   No orders of the defined types were placed in this encounter.   The patient has a good understanding of the overall plan. she agrees with it. she will call with any problems that may develop before the next visit here.   Rulon Eisenmenger, MD 04/25/2015

## 2015-04-25 NOTE — Patient Instructions (Signed)
[facility] Discharge Instructions for Patients Receiving Chemotherapy  Today you received the following chemotherapy agents: Herceptin   To help prevent nausea and vomiting after your treatment, we encourage you to take your nausea medication as directed.    If you develop nausea and vomiting that is not controlled by your nausea medication, call the clinic.   BELOW ARE SYMPTOMS THAT SHOULD BE REPORTED IMMEDIATELY:  *FEVER GREATER THAN 100.5 F  *CHILLS WITH OR WITHOUT FEVER  NAUSEA AND VOMITING THAT IS NOT CONTROLLED WITH YOUR NAUSEA MEDICATION  *UNUSUAL SHORTNESS OF BREATH  *UNUSUAL BRUISING OR BLEEDING  TENDERNESS IN MOUTH AND THROAT WITH OR WITHOUT PRESENCE OF ULCERS  *URINARY PROBLEMS  *BOWEL PROBLEMS  UNUSUAL RASH Items with * indicate a potential emergency and should be followed up as soon as possible.  Feel free to call the clinic you have any questions or concerns. The clinic phone number is [phone].  Please show the CHEMO ALERT CARD at check-in to the Emergency Department and triage nurse.   

## 2015-04-25 NOTE — Patient Instructions (Signed)

## 2015-04-25 NOTE — Telephone Encounter (Signed)
Appointments made and avs printed for patient °

## 2015-04-29 ENCOUNTER — Other Ambulatory Visit: Payer: Self-pay | Admitting: *Deleted

## 2015-05-02 ENCOUNTER — Other Ambulatory Visit: Payer: Self-pay | Admitting: *Deleted

## 2015-05-02 ENCOUNTER — Ambulatory Visit (HOSPITAL_BASED_OUTPATIENT_CLINIC_OR_DEPARTMENT_OTHER): Payer: Managed Care, Other (non HMO)

## 2015-05-02 VITALS — BP 112/56 | HR 62 | Temp 98.3°F

## 2015-05-02 DIAGNOSIS — Z5111 Encounter for antineoplastic chemotherapy: Secondary | ICD-10-CM

## 2015-05-02 DIAGNOSIS — Z23 Encounter for immunization: Secondary | ICD-10-CM | POA: Diagnosis not present

## 2015-05-02 DIAGNOSIS — C50412 Malignant neoplasm of upper-outer quadrant of left female breast: Secondary | ICD-10-CM

## 2015-05-02 MED ORDER — GOSERELIN ACETATE 3.6 MG ~~LOC~~ IMPL
3.6000 mg | DRUG_IMPLANT | Freq: Once | SUBCUTANEOUS | Status: AC
  Administered 2015-05-02: 3.6 mg via SUBCUTANEOUS
  Filled 2015-05-02: qty 3.6

## 2015-05-02 MED ORDER — INFLUENZA VAC SPLIT QUAD 0.5 ML IM SUSY
0.5000 mL | PREFILLED_SYRINGE | Freq: Once | INTRAMUSCULAR | Status: DC
  Filled 2015-05-02: qty 0.5

## 2015-05-02 MED ORDER — INFLUENZA VAC SPLIT QUAD 0.5 ML IM SUSY
0.5000 mL | PREFILLED_SYRINGE | Freq: Once | INTRAMUSCULAR | Status: AC
  Administered 2015-05-02: 0.5 mL via INTRAMUSCULAR
  Filled 2015-05-02: qty 0.5

## 2015-05-11 ENCOUNTER — Other Ambulatory Visit: Payer: Self-pay | Admitting: *Deleted

## 2015-05-11 DIAGNOSIS — C50412 Malignant neoplasm of upper-outer quadrant of left female breast: Secondary | ICD-10-CM

## 2015-05-16 ENCOUNTER — Ambulatory Visit (HOSPITAL_COMMUNITY)
Admission: RE | Admit: 2015-05-16 | Discharge: 2015-05-16 | Disposition: A | Discharge status: Home / Self Care | Payer: Managed Care, Other (non HMO) | Source: Ambulatory Visit | Attending: General Surgery | Admitting: General Surgery

## 2015-05-16 ENCOUNTER — Ambulatory Visit (HOSPITAL_BASED_OUTPATIENT_CLINIC_OR_DEPARTMENT_OTHER): Payer: Managed Care, Other (non HMO) | Admitting: Hematology and Oncology

## 2015-05-16 ENCOUNTER — Encounter: Payer: Self-pay | Admitting: Hematology and Oncology

## 2015-05-16 ENCOUNTER — Ambulatory Visit (HOSPITAL_BASED_OUTPATIENT_CLINIC_OR_DEPARTMENT_OTHER)
Admission: RE | Admit: 2015-05-16 | Discharge: 2015-05-16 | Disposition: A | Discharge status: Home / Self Care | Payer: Managed Care, Other (non HMO) | Source: Ambulatory Visit | Attending: Cardiology | Admitting: Cardiology

## 2015-05-16 ENCOUNTER — Telehealth: Payer: Self-pay | Admitting: Hematology and Oncology

## 2015-05-16 ENCOUNTER — Encounter: Payer: Self-pay | Admitting: *Deleted

## 2015-05-16 ENCOUNTER — Ambulatory Visit: Payer: Managed Care, Other (non HMO)

## 2015-05-16 ENCOUNTER — Other Ambulatory Visit (HOSPITAL_COMMUNITY): Payer: Self-pay | Admitting: Cardiology

## 2015-05-16 ENCOUNTER — Ambulatory Visit (HOSPITAL_BASED_OUTPATIENT_CLINIC_OR_DEPARTMENT_OTHER): Payer: Managed Care, Other (non HMO)

## 2015-05-16 ENCOUNTER — Other Ambulatory Visit (HOSPITAL_BASED_OUTPATIENT_CLINIC_OR_DEPARTMENT_OTHER): Payer: Managed Care, Other (non HMO)

## 2015-05-16 VITALS — BP 112/69 | HR 78 | Temp 98.3°F | Resp 18 | Ht 71.0 in | Wt 181.3 lb

## 2015-05-16 VITALS — BP 104/64 | HR 70 | Wt 179.5 lb

## 2015-05-16 DIAGNOSIS — C50412 Malignant neoplasm of upper-outer quadrant of left female breast: Secondary | ICD-10-CM

## 2015-05-16 DIAGNOSIS — N951 Menopausal and female climacteric states: Secondary | ICD-10-CM | POA: Diagnosis not present

## 2015-05-16 DIAGNOSIS — N61 Mastitis without abscess: Secondary | ICD-10-CM

## 2015-05-16 DIAGNOSIS — I428 Other cardiomyopathies: Secondary | ICD-10-CM

## 2015-05-16 DIAGNOSIS — Z5112 Encounter for antineoplastic immunotherapy: Secondary | ICD-10-CM

## 2015-05-16 DIAGNOSIS — I429 Cardiomyopathy, unspecified: Secondary | ICD-10-CM | POA: Insufficient documentation

## 2015-05-16 DIAGNOSIS — D709 Neutropenia, unspecified: Secondary | ICD-10-CM

## 2015-05-16 DIAGNOSIS — Z170 Estrogen receptor positive status [ER+]: Secondary | ICD-10-CM

## 2015-05-16 DIAGNOSIS — F329 Major depressive disorder, single episode, unspecified: Secondary | ICD-10-CM

## 2015-05-16 DIAGNOSIS — Z95828 Presence of other vascular implants and grafts: Secondary | ICD-10-CM

## 2015-05-16 LAB — CBC WITH DIFFERENTIAL/PLATELET
BASO%: 1.1 % (ref 0.0–2.0)
Basophils Absolute: 0 10*3/uL (ref 0.0–0.1)
EOS ABS: 0.1 10*3/uL (ref 0.0–0.5)
EOS%: 1.4 % (ref 0.0–7.0)
HCT: 32.8 % — ABNORMAL LOW (ref 34.8–46.6)
HGB: 10.6 g/dL — ABNORMAL LOW (ref 11.6–15.9)
LYMPH%: 31.5 % (ref 14.0–49.7)
MCH: 28.2 pg (ref 25.1–34.0)
MCHC: 32.2 g/dL (ref 31.5–36.0)
MCV: 87.9 fL (ref 79.5–101.0)
MONO#: 0.3 10*3/uL (ref 0.1–0.9)
MONO%: 7.8 % (ref 0.0–14.0)
NEUT#: 2.3 10*3/uL (ref 1.5–6.5)
NEUT%: 58.2 % (ref 38.4–76.8)
PLATELETS: 198 10*3/uL (ref 145–400)
RBC: 3.74 10*6/uL (ref 3.70–5.45)
RDW: 14.8 % — ABNORMAL HIGH (ref 11.2–14.5)
WBC: 4 10*3/uL (ref 3.9–10.3)
lymph#: 1.3 10*3/uL (ref 0.9–3.3)

## 2015-05-16 LAB — COMPREHENSIVE METABOLIC PANEL (CC13)
ALBUMIN: 3.5 g/dL (ref 3.5–5.0)
ALK PHOS: 60 U/L (ref 40–150)
ALT: 11 U/L (ref 0–55)
ANION GAP: 8 meq/L (ref 3–11)
AST: 23 U/L (ref 5–34)
BUN: 13.5 mg/dL (ref 7.0–26.0)
CO2: 24 meq/L (ref 22–29)
Calcium: 9 mg/dL (ref 8.4–10.4)
Chloride: 108 mEq/L (ref 98–109)
Creatinine: 0.8 mg/dL (ref 0.6–1.1)
Glucose: 126 mg/dl (ref 70–140)
Potassium: 4 mEq/L (ref 3.5–5.1)
Sodium: 139 mEq/L (ref 136–145)
TOTAL PROTEIN: 6.8 g/dL (ref 6.4–8.3)

## 2015-05-16 MED ORDER — DIPHENHYDRAMINE HCL 25 MG PO CAPS
ORAL_CAPSULE | ORAL | Status: AC
  Filled 2015-05-16: qty 2

## 2015-05-16 MED ORDER — SODIUM CHLORIDE 0.9 % IJ SOLN
10.0000 mL | INTRAMUSCULAR | Status: DC | PRN
  Administered 2015-05-16: 10 mL via INTRAVENOUS
  Filled 2015-05-16: qty 10

## 2015-05-16 MED ORDER — DIPHENHYDRAMINE HCL 25 MG PO CAPS
50.0000 mg | ORAL_CAPSULE | Freq: Once | ORAL | Status: AC
  Administered 2015-05-16: 25 mg via ORAL

## 2015-05-16 MED ORDER — DIPHENHYDRAMINE HCL 25 MG PO CAPS
ORAL_CAPSULE | ORAL | Status: AC
  Filled 2015-05-16: qty 1

## 2015-05-16 MED ORDER — HEPARIN SOD (PORK) LOCK FLUSH 100 UNIT/ML IV SOLN
500.0000 [IU] | Freq: Once | INTRAVENOUS | Status: AC
  Administered 2015-05-16: 500 [IU] via INTRAVENOUS
  Filled 2015-05-16: qty 5

## 2015-05-16 MED ORDER — ACETAMINOPHEN 325 MG PO TABS
650.0000 mg | ORAL_TABLET | Freq: Once | ORAL | Status: AC
  Administered 2015-05-16: 650 mg via ORAL

## 2015-05-16 MED ORDER — CANDESARTAN CILEXETIL 4 MG PO TABS: 4.0000 mg | ORAL_TABLET | Freq: Two times a day (BID) | ORAL | Status: DC

## 2015-05-16 MED ORDER — SODIUM CHLORIDE 0.9 % IV SOLN
Freq: Once | INTRAVENOUS | Status: AC
  Administered 2015-05-16: 15:00:00 via INTRAVENOUS

## 2015-05-16 MED ORDER — TRASTUZUMAB CHEMO INJECTION 440 MG
6.0000 mg/kg | Freq: Once | INTRAVENOUS | Status: AC
  Administered 2015-05-16: 504 mg via INTRAVENOUS
  Filled 2015-05-16: qty 24

## 2015-05-16 MED ORDER — ACETAMINOPHEN 325 MG PO TABS
ORAL_TABLET | ORAL | Status: AC
  Filled 2015-05-16: qty 2

## 2015-05-16 NOTE — Addendum Note (Signed)
Addended by: Prentiss Bells on: 05/16/2015 06:27 PM   Modules accepted: Medications

## 2015-05-16 NOTE — Patient Instructions (Signed)
[facility] Discharge Instructions for Patients Receiving Chemotherapy  Today you received the following chemotherapy agents herceptin   To help prevent nausea and vomiting after your treatment, we encourage you to take your nausea medication as directed   If you develop nausea and vomiting that is not controlled by your nausea medication, call the clinic.   BELOW ARE SYMPTOMS THAT SHOULD BE REPORTED IMMEDIATELY:  *FEVER GREATER THAN 100.5 F  *CHILLS WITH OR WITHOUT FEVER  NAUSEA AND VOMITING THAT IS NOT CONTROLLED WITH YOUR NAUSEA MEDICATION  *UNUSUAL SHORTNESS OF BREATH  *UNUSUAL BRUISING OR BLEEDING  TENDERNESS IN MOUTH AND THROAT WITH OR WITHOUT PRESENCE OF ULCERS  *URINARY PROBLEMS  *BOWEL PROBLEMS  UNUSUAL RASH Items with * indicate a potential emergency and should be followed up as soon as possible.  Feel free to call the clinic you have any questions or concerns. The clinic phone number is [phone].  

## 2015-05-16 NOTE — Progress Notes (Signed)
  Echocardiogram 2D Echocardiogram has been performed.  Jennette Dubin 05/16/2015, 11:05 AM

## 2015-05-16 NOTE — Progress Notes (Signed)
Advanced Heart Failure Medication Review by a Pharmacist  Does the patient  feel that his/her medications are working for him/her?  yes  Has the patient been experiencing any side effects to the medications prescribed?  no  Does the patient measure his/her own blood pressure or blood glucose at home?  no   Does the patient have any problems obtaining medications due to transportation or finances?   no  Understanding of regimen: good Understanding of indications: good Potential of compliance: good Patient understands to avoid NSAIDs. Patient understands to avoid decongestants.  Issues to address at subsequent visits: None   Pharmacist comments:  Ms. Vasicek is a pleasant 34 yo F presenting without a medication list but with excellent recall of her regimen including dosages. She reports good compliance with her medications and did not have any specific medication-related questions or concerns for me at this time.   Ruta Hinds. Velva Harman, PharmD, BCPS, CPP Clinical Pharmacist Pager: [phone] Phone: [phone] 05/16/2015 11:13 AM      Time with patient: 6 minutes Preparation and documentation time: 2 minutes Total time: 8 minutes

## 2015-05-16 NOTE — Patient Instructions (Signed)
Increase Candesartan to 4 mg Twice daily   Your physician has requested that you have an echocardiogram. Echocardiography is a painless test that uses sound waves to create images of your heart. It provides your doctor with information about the size and shape of your heart and how well your heart's chambers and valves are working. This procedure takes approximately one hour. There are no restrictions for this procedure.  Your physician recommends that you schedule a follow-up appointment in: 2 months

## 2015-05-16 NOTE — Progress Notes (Signed)
Patient ID: [name], female   DOB: 12-Jul-1980, 34 y.o.   MRN: [id number]   Advanced Heart Failure Clinic Note   Patient ID: Natasha Torres, female   DOB: 06-21-1980, 34 y.o.   MRN: [id number] Primary oncologist: Dr Lindi Adie HF: Dr Aundra Dubin.  34 yo with left breast cancer presents for evaluation of fall in EF and strain by echo monitoring.  Breast cancer was diagnosed in 2/16.  ER+/PR-/HER2+.  Treated with neoadjuvant chemo (carboplatin, docetaxel, Herceptin, and pertuzumab).  She completed this treatment and is now continuing on Herceptin alone to complete 1 year of treatment. She is s/p mastectomy.  She had an echo in 4/16 showed EF 55-60% with GLS -25%.  However, repeat echo in 6/16 showed fall in EF to 50-55% with GLS fallen to -17%.  Echo at last appointment in 8/16 showed EF down to 45%.  At that time, Herceptin was held for 6 weeks and later restarted.  Today's echo shows EF in the 50% range.    She returns today for Cardio-oncology follow up.  No lightheadedness.  No SOB, CP, orthopnea, bendopnea, PND, or near syncope.  She has no history of cardiac problems.  No family history of cardiomyopathy or early coronary disease.   Labs (7/16): K 4, creatinine 0.7 Labs (8/16): K 4.3, creatinine 0.64 Labs (9/16): K 3.7, creatinine 0.81 Labs (11/16): K 4.1, creatinine 0.7  PMH: 1. Breast cancer: On left, diagnosed 2/16.  ER+/PR-/HER2+.  Treated with neoadjuvant chemo (carboplatin, docetaxel, Herceptin, and pertuzumab).  She completed this treatment and is now continuing on Herceptin alone to complete 1 year of treatment.  S/p mastectomy.  2. 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  - Echo (4/16) with EF 55-60%, GLS -25%. - Echo (6/16) with EF 50-55%, GLS -17% - Echo (8/16) with EF 45%, GLS -18.6%, lateral s' 13.5 cm/sec - Echo (9/16) with EF 50-55%, no strain done, lateral s' 13.1 cm/sec - Echo (11/16) with EF 50%, GLS -15.8% (poor signal), lateral s' 14.3 cm/sec  Social History     Social History  . Marital Status: Married    Spouse Name: Harrell Gave  . Number of Children: 1  . Years of Education: N/A   Social History Main Topics  . Smoking status: Never Smoker   . Smokeless tobacco: Never Used  . Alcohol Use: 4.8 oz/week    8 Glasses of wine, 0 Standard drinks or equivalent per week  . Drug Use: No  . Sexual Activity: Not Currently   Other Topics Concern  . Not on file   Social History Narrative   ** Merged History Encounter **       Family History  Problem Relation Age of Onset  . Hypertension Mother   . Depression Maternal Aunt   . Cancer Maternal Grandfather   . Cancer Paternal Grandfather   . Prostate cancer Father 99  . Dementia Maternal Grandmother   . Heart attack Maternal Grandfather   . Dementia Paternal Grandmother   . Kidney cancer Paternal Grandmother     slow growing, no treatment  . Prostate cancer Paternal Grandfather 74  . Bone cancer Paternal Grandfather 42  . Breast cancer Paternal Grandfather 41  . Lung cancer Paternal Grandfather     dx late 66s; smoker.  thought to be a 4th primary cancer  . Lung cancer Other     smoker  . Prostate cancer Other     MGMs 1/2 brother   ROS: All systems reviewed and negative except as per HPI.  Current Outpatient Prescriptions  Medication Sig Dispense Refill  . BIOTIN PO Take 2 tablets by mouth daily.    . candesartan (ATACAND) 4 MG tablet Take 1 tablet (4 mg total) by mouth 2 (two) times daily. 60 tablet 6  . carvedilol (COREG) 6.25 MG tablet Take 1 tablet (6.25 mg total) by mouth 2 (two) times daily with a meal. 60 tablet 3  . docusate sodium (COLACE) 100 MG capsule Take 100 mg by mouth daily as needed for mild constipation.    [name] HYDROcodone-acetaminophen (NORCO/VICODIN) 5-325 MG tablet Take 1-2 tablets by mouth every 4 (four) hours as needed for moderate pain.   0  . ibuprofen (ADVIL,MOTRIN) 200 MG tablet Take 200-400 mg by mouth every 6 (six) hours as needed for headache, mild pain  or moderate pain.    [name] lidocaine-prilocaine (EMLA) cream Apply 1 application topically as needed. 30 g 6  . loperamide (IMODIUM) 2 MG capsule Take 2 mg by mouth as needed for diarrhea or loose stools.    [name] LORazepam (ATIVAN) 0.5 MG tablet TAKE 1 TABLET BY MOUTH EVERY 8 HOURS AS NEEDED 60 tablet 0  . Multiple Vitamin (MULTIVITAMIN) tablet Take 2 tablets by mouth daily.     [name] sulfamethoxazole-trimethoprim (BACTRIM DS,SEPTRA DS) 800-160 MG tablet Take 1 tablet by mouth 2 (two) times daily.     . tamoxifen (NOLVADEX) 20 MG tablet Take 1 tablet (20 mg total) by mouth daily. 90 tablet 3  . venlafaxine XR (EFFEXOR-XR) 75 MG 24 hr capsule Take 1 capsule (75 mg total) by mouth daily with breakfast. 30 capsule 6  . zolpidem (AMBIEN) 5 MG tablet Take 1 tablet (5 mg total) by mouth at bedtime as needed for sleep. 30 tablet 0  . prochlorperazine (COMPAZINE) 10 MG tablet Take 1 tablet (10 mg total) by mouth every 6 (six) hours as needed (Nausea or vomiting). 30 tablet 1   No current facility-administered medications for this encounter.   BP 104/64 mmHg  Pulse 70  Wt 179 lb 8 oz (81.421 kg)  SpO2 100%   General: NAD Neck: No JVD, no thyromegaly or thyroid nodule.  Lungs: Clear to auscultation bilaterally with normal respiratory effort. CV: Nondisplaced PMI.  Heart regular S1/S2, no S3/S4, no murmur.  No peripheral edema.  No carotid bruit.  Normal pedal pulses.  Abdomen: Soft, nontender, no hepatosplenomegaly, no distention.  Skin: Intact without lesions or rashes.  Neurologic: Alert and oriented x 3.  Psych: Normal affect. Extremities: No clubbing or cyanosis.  HEENT: Normal.   Assessment/plan: 34 yo with history of breast cancer getting Herceptin to complete 1 year of therapy (4/17).   - Echo in 6/16 showed a fall in EF from 55-60% to 50-55%.  There was also a significant fall in global lateral strain, portending the possibility of more profound fall in EF over time.  This may be related to  Herceptin.  Echo 02/03/15 showed EF 45%, strain stable compared to 6/16.  Herceptin was held for 6 wks.  Repeat echo showed EF up to 50-55%, and echo today shows stable EF in the 50% range. Today's echo had a poor strain signal, so I do not think that the strain is accurate. - EF not totally normal but stable.  Continue Herceptin for now.  - Continue Coreg 6.25 mg bid, increase candesartan to 4 mg bid with BMET in 10 days.  - Follow up 2 months with echo (will have 2 month followup rather than 3 months as EF is still  mildly below normal).   Loralie Champagne  05/16/2015

## 2015-05-16 NOTE — Assessment & Plan Note (Signed)
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 Biopsy of the right breast mass and left axillary lymph node and the secondary mass left breast back as benign. Status post neo-adjuvant TCH Perjeta 6 cycles from 09/27/2014 to 01/10/2015 Followed by bilateral mastectomies on 02/10/2015: No malignancy in either breast ----------------------------------------------------------------------------------------------------------------------------------------------------- Plan: 1. On maintenance Herceptin to complete 1 year of therapy. Previously Herceptin is on hold because of decline in ejection fraction. Echocardiogram on 02/03/2015 showed an ejection fraction of 45%. This has dropped from 55% previously. Dr. Kirk Ruths is monitoring her.  Repeat echo 03/14/2015 showed EF 50-55%. 2. Started anti-estrogen therapy with tamoxifen with Zoladex for ovarian suppression on 02/22/15.  Tamoxifen and Zoladex toxicities: 1. Hot flashes: Minimal, one episode of severe sweats: improved by Effexor 75 mg daily 2. Depression symptoms improved with Effexor. Patient is going to a lot of personal issues. She is getting a divorce.   Right breast cellulitis: resolved with 2 doses of vancomycin, 3 rounds of Bactrim and drainage of breast fluid. Neutropenia: 3 weeks ago her white count and neutrophil count were normal. I suspect this is because of Bactrim. We will recheck her blood counts in 3 weeks in follow-up.  Return to clinic in 3 weeks for follow-up  And Herceptin

## 2015-05-16 NOTE — Progress Notes (Signed)
Patient Care Team: Loraine Leriche, MD as PCP - General (Internal Medicine)  SUMMARY OF ONCOLOGIC HISTORY:   Breast cancer of upper-outer quadrant of left female breast ([place])   08/27/2014 Initial Diagnosis Excisional biopsy: 2 lumps showing 1.5 cm and 0.9 cm IDC, Grade 3, ER Pos, PR Neg and HER-2 amplified Ratio 2.6, Multifocal   09/27/2014 - 01/10/2015 Neo-Adjuvant Chemotherapy Neoadjuvant TCH Perjeta every 3 week 6 followed by Herceptin maintenance   01/13/2015 Breast MRI Postsurgical changes in left breast without residual enhancing masses compatible with treatment response   02/10/2015 Surgery right mastectomy: No malignancy, 0/3 sentinel nodes, complete response to chemotherapy   02/22/2015 -  Anti-estrogen oral therapy Zoladex with tamoxifen    CHIEF COMPLIANT: follow-up on Herceptin and antiestrogen therapy  INTERVAL HISTORY: Natasha Torres is a 34 year old with above-mentioned history of left breast cancer treated with neoadjuvant chemotherapy and had a pathologic complete response. She is currently on antiestrogen therapy with Zoladex with tamoxifen. She appears to be tolerating it extremely well. She is also on Herceptin maintenance.her echocardiogram showed stable ejection fraction.  REVIEW OF SYSTEMS:   Constitutional: Denies fevers, chills or abnormal weight loss Eyes: Denies blurriness of vision Ears, nose, mouth, throat, and face: Denies mucositis or sore throat Respiratory: Denies cough, dyspnea or wheezes Cardiovascular: Denies palpitation, chest discomfort or lower extremity swelling Gastrointestinal:  Denies nausea, heartburn or change in bowel habits Skin: Denies abnormal skin rashes Lymphatics: Denies new lymphadenopathy or easy bruising Neurological:Denies numbness, tingling or new weaknesses Behavioral/Psych: Mood is stable, no new changes  Breast: recurrent right breast cellulitis All other systems were reviewed with the patient and are negative.  I have  reviewed the past medical history, past surgical history, social history and family history with the patient and they are unchanged from previous note.  ALLERGIES:  is allergic to buprenorphine hcl; morphine and related; and penicillins.  MEDICATIONS:  Current Outpatient Prescriptions  Medication Sig Dispense Refill  . BIOTIN PO Take 2 tablets by mouth daily.    . candesartan (ATACAND) 4 MG tablet Take 1 tablet (4 mg total) by mouth 2 (two) times daily. 60 tablet 6  . carvedilol (COREG) 6.25 MG tablet Take 1 tablet (6.25 mg total) by mouth 2 (two) times daily with a meal. 60 tablet 3  . docusate sodium (COLACE) 100 MG capsule Take 100 mg by mouth daily as needed for mild constipation.    [name] HYDROcodone-acetaminophen (NORCO/VICODIN) 5-325 MG tablet Take 1-2 tablets by mouth every 4 (four) hours as needed for moderate pain.   0  . ibuprofen (ADVIL,MOTRIN) 200 MG tablet Take 200-400 mg by mouth every 6 (six) hours as needed for headache, mild pain or moderate pain.    [name] lidocaine-prilocaine (EMLA) cream Apply 1 application topically as needed. 30 g 6  . loperamide (IMODIUM) 2 MG capsule Take 2 mg by mouth as needed for diarrhea or loose stools.    [name] LORazepam (ATIVAN) 0.5 MG tablet TAKE 1 TABLET BY MOUTH EVERY 8 HOURS AS NEEDED 60 tablet 0  . Multiple Vitamin (MULTIVITAMIN) tablet Take 2 tablets by mouth daily.     . prochlorperazine (COMPAZINE) 10 MG tablet Take 1 tablet (10 mg total) by mouth every 6 (six) hours as needed (Nausea or vomiting). (Patient not taking: Reported on 05/16/2015) 30 tablet 1  . sulfamethoxazole-trimethoprim (BACTRIM DS,SEPTRA DS) 800-160 MG tablet Take 1 tablet by mouth 2 (two) times daily.     . tamoxifen (NOLVADEX) 20 MG tablet Take 1 tablet (  20 mg total) by mouth daily. 90 tablet 3  . venlafaxine XR (EFFEXOR-XR) 75 MG 24 hr capsule Take 1 capsule (75 mg total) by mouth daily with breakfast. 30 capsule 6  . zolpidem (AMBIEN) 5 MG tablet Take 1 tablet (5 mg total) by  mouth at bedtime as needed for sleep. 30 tablet 0   No current facility-administered medications for this visit.    PHYSICAL EXAMINATION: ECOG PERFORMANCE STATUS: 1 - Symptomatic but completely ambulatory  Filed Vitals:   05/16/15 1341  BP: 112/69  Pulse: 78  Temp: 98.3 F (36.8 C)  Resp: 18   Filed Weights   05/16/15 1341  Weight: 181 lb 4.8 oz (82.237 kg)    GENERAL:alert, no distress and comfortable SKIN: skin color, texture, turgor are normal, no rashes or significant lesions EYES: normal, Conjunctiva are pink and non-injected, sclera clear OROPHARYNX:no exudate, no erythema and lips, buccal mucosa, and tongue normal  NECK: supple, thyroid normal size, non-tender, without nodularity LYMPH:  no palpable lymphadenopathy in the cervical, axillary or inguinal LUNGS: clear to auscultation and percussion with normal breathing effort HEART: regular rate & rhythm and no murmurs and no lower extremity edema ABDOMEN:abdomen soft, non-tender and normal bowel sounds Musculoskeletal:no cyanosis of digits and no clubbing  NEURO: alert & oriented x 3 with fluent speech, no focal motor/sensory deficits  LABORATORY DATA:  I have reviewed the data as listed   Chemistry      Component Value Date/Time   NA 139 05/16/2015 1302   NA 133* 02/27/2015 1135   K 4.0 05/16/2015 1302   K 3.7 02/27/2015 1135   CL 99* 02/27/2015 1135   CO2 24 05/16/2015 1302   CO2 25 02/27/2015 1135   BUN 13.5 05/16/2015 1302   BUN 16 02/27/2015 1135   CREATININE 0.8 05/16/2015 1302   CREATININE 0.81 02/27/2015 1135      Component Value Date/Time   CALCIUM 9.0 05/16/2015 1302   CALCIUM 8.8* 02/27/2015 1135   ALKPHOS 60 05/16/2015 1302   ALKPHOS 41 02/27/2015 1135   AST 23 05/16/2015 1302   AST 24 02/27/2015 1135   ALT 11 05/16/2015 1302   ALT 13* 02/27/2015 1135   BILITOT <0.30 05/16/2015 1302   BILITOT 0.5 02/27/2015 1135       Lab Results  Component Value Date   WBC 4.0 05/16/2015   HGB  10.6* 05/16/2015   HCT 32.8* 05/16/2015   MCV 87.9 05/16/2015   PLT 198 05/16/2015   NEUTROABS 2.3 05/16/2015   ASSESSMENT & PLAN:  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 Biopsy of the right breast mass and left axillary lymph node and the secondary mass left breast back as benign. Status post neo-adjuvant TCH Perjeta 6 cycles from 09/27/2014 to 01/10/2015 Followed by bilateral mastectomies on 02/10/2015: No malignancy in either breast ----------------------------------------------------------------------------------------------------------------------------------------------------- Plan: 1. On maintenance Herceptin to complete 1 year of therapy. Previously Herceptin is on hold because of decline in ejection fraction. Echocardiogram on 02/03/2015 showed an ejection fraction of 45%. This has dropped from 55% previously. Dr. Kirk Ruths is monitoring her.  Repeat echo 03/14/2015 showed EF 50-55%. 2. Started anti-estrogen therapy with tamoxifen  with Zoladex for ovarian suppression on 02/22/15.  Tamoxifen and Zoladex toxicities: 1. Hot flashes: Minimal, one episode of severe sweats: improved by Effexor 75 mg daily 2. Depression symptoms improved with Effexor. Patient is going to a lot of personal issues. She is getting a divorce.   Right breast cellulitis:.back on Bactrim Neutropenia: 3 weeks ago her white count and neutrophil count were normal. I suspect this is because of Bactrim. We will recheck her blood counts in 3 weeks in follow-up.  Return to clinic in 6 weeks for follow-up on Herceptin   No orders of the defined  types were placed in this encounter.   The patient has a good understanding of the overall plan. she agrees with it. she will call with any problems that may develop before the next visit here.   Rulon Eisenmenger, MD 05/16/2015

## 2015-05-16 NOTE — Telephone Encounter (Signed)
Gave and printed appt sched and avs for pt for DEc and Jan  °

## 2015-05-17 ENCOUNTER — Encounter: Payer: Self-pay | Admitting: Hematology and Oncology

## 2015-05-17 ENCOUNTER — Ambulatory Visit: Payer: Self-pay

## 2015-05-17 NOTE — Progress Notes (Signed)
Got msg from [place] that pt had some financial concerns.  I called and left a vm requesting she return my call to discuss her needs and concerns.

## 2015-05-19 ENCOUNTER — Telehealth (HOSPITAL_COMMUNITY): Payer: Self-pay | Admitting: Pharmacist

## 2015-05-19 NOTE — Telephone Encounter (Signed)
Candesartan 4 mg BID PA approved through [employer] until 05/17/2016. Pharmacy and patient aware.   Ruta Hinds. Velva Harman, PharmD, BCPS, CPP Clinical Pharmacist Pager: [phone] Phone: [phone] 05/19/2015 12:37 PM

## 2015-05-25 NOTE — H&P (Signed)
  Subjective:    Patient ID: Natasha Torres is a 34 y.o. female.  HPI  3.5 months post op bilateral nipple sparing mastectomy with tissue expander, acellular dermis reconstruction. Course complicated by cellulitis and thick drain effluent and is now 3 months post removal and replacement expanders, removal ADM. Has had sterile seroma right breast drained since then.  Presented with palpable mass on left. MMG and US revealed 2 masses in breast. 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 Genetics with ATM mutation. Biopsy of the right breast mass and left axillary lymph node and the secondary mass left breast was benign. Completed neoadjuvant chemotherapy. Final pathology with no residual cancer, LN negative. Repeat ECHO with improved EF and Herceptin resumed.  Prior 32 C, desires this but fuller. Right mastectomy 418 g Left mastectomy 312 g   Review of Systems     Objective:   Physical Exam  Cardiovascular: Normal rate, regular rhythm and normal heart sounds.  Pulmonary/Chest: Effort normal and breath sounds normal.  Abdominal: Soft.   SN to nipple R 21 L 21 cm BW R 12 L cm Nipple to IMF R 9 L 9 cm     Assessment:     Left breast cancer, ATM mutation  Neoadjuvant chemotherapy S/p bilateral NSM, TE/ADM reconstruction S/p removal/replacement bilateral TE, removal ADM for cellulitis    Plan:     Plan second stage surgery for removal TE, placement implants, and lipofilling to bilateral breasts.  Has elected for round silicone. Reviewedmy preference for textured to prevent lateral displacement, discussed this will produce more fluid and I utlize drains for this reason, risk recurrent seroma. She agrees to textured devices.  Reviewed variable take fat grafting, donor site pain and need for compression for 4-6 weeks, possible need to repeat, fat necrosis. Additional  risks including contracture, infection required removal or replacement implant, seroma, hematoma, damage to deeper structures, asymmetry, DVT/PE, cardiopulmonary complications reviewed.   Mentor Artoura Ultra High profile 455 ml tissue expander placed bilateral Right fill volume 383m  Left fill volume 355 ml   BIrene Limbo MD [facility] [phone]

## 2015-05-30 ENCOUNTER — Ambulatory Visit: Payer: Self-pay

## 2015-05-30 ENCOUNTER — Ambulatory Visit (HOSPITAL_BASED_OUTPATIENT_CLINIC_OR_DEPARTMENT_OTHER): Payer: Managed Care, Other (non HMO)

## 2015-05-30 ENCOUNTER — Encounter: Payer: Self-pay | Admitting: *Deleted

## 2015-05-30 VITALS — BP 120/65 | HR 80 | Temp 98.4°F

## 2015-05-30 DIAGNOSIS — Z5111 Encounter for antineoplastic chemotherapy: Secondary | ICD-10-CM | POA: Diagnosis not present

## 2015-05-30 DIAGNOSIS — C50412 Malignant neoplasm of upper-outer quadrant of left female breast: Secondary | ICD-10-CM | POA: Diagnosis not present

## 2015-05-30 MED ORDER — GOSERELIN ACETATE 3.6 MG ~~LOC~~ IMPL
3.6000 mg | DRUG_IMPLANT | Freq: Once | SUBCUTANEOUS | Status: AC
  Administered 2015-05-30: 3.6 mg via SUBCUTANEOUS
  Filled 2015-05-30: qty 3.6

## 2015-05-31 ENCOUNTER — Encounter (HOSPITAL_BASED_OUTPATIENT_CLINIC_OR_DEPARTMENT_OTHER): Payer: Self-pay | Admitting: *Deleted

## 2015-06-02 ENCOUNTER — Other Ambulatory Visit: Payer: Self-pay

## 2015-06-02 DIAGNOSIS — C50919 Malignant neoplasm of unspecified site of unspecified female breast: Secondary | ICD-10-CM

## 2015-06-02 DIAGNOSIS — C50412 Malignant neoplasm of upper-outer quadrant of left female breast: Secondary | ICD-10-CM

## 2015-06-06 ENCOUNTER — Other Ambulatory Visit (HOSPITAL_BASED_OUTPATIENT_CLINIC_OR_DEPARTMENT_OTHER): Payer: Managed Care, Other (non HMO)

## 2015-06-06 ENCOUNTER — Ambulatory Visit (HOSPITAL_BASED_OUTPATIENT_CLINIC_OR_DEPARTMENT_OTHER): Payer: Managed Care, Other (non HMO)

## 2015-06-06 ENCOUNTER — Encounter: Payer: Self-pay | Admitting: *Deleted

## 2015-06-06 VITALS — BP 124/72 | HR 65 | Temp 98.4°F | Resp 18

## 2015-06-06 DIAGNOSIS — Z5112 Encounter for antineoplastic immunotherapy: Secondary | ICD-10-CM | POA: Diagnosis not present

## 2015-06-06 DIAGNOSIS — C50412 Malignant neoplasm of upper-outer quadrant of left female breast: Secondary | ICD-10-CM

## 2015-06-06 DIAGNOSIS — C50919 Malignant neoplasm of unspecified site of unspecified female breast: Secondary | ICD-10-CM

## 2015-06-06 LAB — CBC WITH DIFFERENTIAL/PLATELET
BASO%: 0.8 % (ref 0.0–2.0)
BASOS ABS: 0 10*3/uL (ref 0.0–0.1)
EOS%: 1.3 % (ref 0.0–7.0)
Eosinophils Absolute: 0.1 10*3/uL (ref 0.0–0.5)
HCT: 31.7 % — ABNORMAL LOW (ref 34.8–46.6)
HGB: 10.2 g/dL — ABNORMAL LOW (ref 11.6–15.9)
LYMPH%: 38.6 % (ref 14.0–49.7)
MCH: 28 pg (ref 25.1–34.0)
MCHC: 32.3 g/dL (ref 31.5–36.0)
MCV: 86.8 fL (ref 79.5–101.0)
MONO#: 0.4 10*3/uL (ref 0.1–0.9)
MONO%: 9.8 % (ref 0.0–14.0)
NEUT#: 1.9 10*3/uL (ref 1.5–6.5)
NEUT%: 49.5 % (ref 38.4–76.8)
Platelets: 195 10*3/uL (ref 145–400)
RBC: 3.65 10*6/uL — AB (ref 3.70–5.45)
RDW: 14.5 % (ref 11.2–14.5)
WBC: 3.8 10*3/uL — ABNORMAL LOW (ref 3.9–10.3)
lymph#: 1.5 10*3/uL (ref 0.9–3.3)

## 2015-06-06 LAB — COMPREHENSIVE METABOLIC PANEL
ALT: 15 U/L (ref 0–55)
AST: 27 U/L (ref 5–34)
Albumin: 3.6 g/dL (ref 3.5–5.0)
Alkaline Phosphatase: 58 U/L (ref 40–150)
Anion Gap: 8 mEq/L (ref 3–11)
BUN: 16.1 mg/dL (ref 7.0–26.0)
CHLORIDE: 104 meq/L (ref 98–109)
CO2: 25 meq/L (ref 22–29)
CREATININE: 0.8 mg/dL (ref 0.6–1.1)
Calcium: 9 mg/dL (ref 8.4–10.4)
EGFR: >90 mL/min/{1.73_m2} (ref >90)
GLUCOSE: 94 mg/dL (ref 70–140)
POTASSIUM: 3.9 meq/L (ref 3.5–5.1)
SODIUM: 136 meq/L (ref 136–145)
Total Bilirubin: <0.3 mg/dL (ref 0.20–1.20)
Total Protein: 6.9 g/dL (ref 6.4–8.3)

## 2015-06-06 MED ORDER — SODIUM CHLORIDE 0.9 % IV SOLN
6.0000 mg/kg | Freq: Once | INTRAVENOUS | Status: AC
  Administered 2015-06-06: 504 mg via INTRAVENOUS
  Filled 2015-06-06: qty 24

## 2015-06-06 MED ORDER — DIPHENHYDRAMINE HCL 25 MG PO CAPS
50.0000 mg | ORAL_CAPSULE | Freq: Once | ORAL | Status: AC
  Administered 2015-06-06: 25 mg via ORAL

## 2015-06-06 MED ORDER — HEPARIN SOD (PORK) LOCK FLUSH 100 UNIT/ML IV SOLN
500.0000 [IU] | Freq: Once | INTRAVENOUS | Status: AC | PRN
  Administered 2015-06-06: 500 [IU]
  Filled 2015-06-06: qty 5

## 2015-06-06 MED ORDER — ACETAMINOPHEN 325 MG PO TABS
650.0000 mg | ORAL_TABLET | Freq: Once | ORAL | Status: AC
  Administered 2015-06-06: 650 mg via ORAL

## 2015-06-06 MED ORDER — SODIUM CHLORIDE 0.9 % IJ SOLN
10.0000 mL | INTRAMUSCULAR | Status: DC | PRN
  Administered 2015-06-06: 10 mL
  Filled 2015-06-06: qty 10

## 2015-06-06 MED ORDER — SODIUM CHLORIDE 0.9 % IV SOLN
Freq: Once | INTRAVENOUS | Status: AC
  Administered 2015-06-06: 14:00:00 via INTRAVENOUS

## 2015-06-06 MED ORDER — DIPHENHYDRAMINE HCL 25 MG PO CAPS
ORAL_CAPSULE | ORAL | Status: AC
  Filled 2015-06-06: qty 1

## 2015-06-06 MED ORDER — ACETAMINOPHEN 325 MG PO TABS
ORAL_TABLET | ORAL | Status: AC
  Filled 2015-06-06: qty 2

## 2015-06-06 NOTE — Patient Instructions (Signed)
[facility] Discharge Instructions for Patients  Today you received the following: Herceptin   To help prevent nausea and vomiting after your treatment, we encourage you to take your nausea medication as directed.   If you develop nausea and vomiting that is not controlled by your nausea medication, call the clinic.   BELOW ARE SYMPTOMS THAT SHOULD BE REPORTED IMMEDIATELY:  *FEVER GREATER THAN 100.5 F  *CHILLS WITH OR WITHOUT FEVER  NAUSEA AND VOMITING THAT IS NOT CONTROLLED WITH YOUR NAUSEA MEDICATION  *UNUSUAL SHORTNESS OF BREATH  *UNUSUAL BRUISING OR BLEEDING  TENDERNESS IN MOUTH AND THROAT WITH OR WITHOUT PRESENCE OF ULCERS  *URINARY PROBLEMS  *BOWEL PROBLEMS  UNUSUAL RASH Items with * indicate a potential emergency and should be followed up as soon as possible.  Feel free to call the clinic you have any questions or concerns. The clinic phone number is [phone].  Please show the CHEMO ALERT CARD at check-in to the Emergency Department and triage nurse.   

## 2015-06-06 NOTE — Progress Notes (Signed)
Echo 05/16/15 50% Dr. Lindi Adie aware, okay to proceed with treatment per Dr. Lindi Adie, no new orders at this time.

## 2015-06-07 ENCOUNTER — Ambulatory Visit (HOSPITAL_BASED_OUTPATIENT_CLINIC_OR_DEPARTMENT_OTHER): Payer: Managed Care, Other (non HMO) | Admitting: Certified Registered"

## 2015-06-07 ENCOUNTER — Ambulatory Visit (HOSPITAL_BASED_OUTPATIENT_CLINIC_OR_DEPARTMENT_OTHER)
Admission: RE | Admit: 2015-06-07 | Discharge: 2015-06-08 | Disposition: A | Discharge status: Home / Self Care | Payer: Managed Care, Other (non HMO) | Source: Ambulatory Visit | Attending: Plastic Surgery | Admitting: Plastic Surgery

## 2015-06-07 ENCOUNTER — Encounter (HOSPITAL_BASED_OUTPATIENT_CLINIC_OR_DEPARTMENT_OTHER): Payer: Self-pay | Admitting: *Deleted

## 2015-06-07 ENCOUNTER — Encounter (HOSPITAL_BASED_OUTPATIENT_CLINIC_OR_DEPARTMENT_OTHER)
Admission: RE | Disposition: A | Discharge status: Home / Self Care | Payer: Self-pay | Source: Ambulatory Visit | Attending: Plastic Surgery

## 2015-06-07 DIAGNOSIS — Z880 Allergy status to penicillin: Secondary | ICD-10-CM | POA: Insufficient documentation

## 2015-06-07 DIAGNOSIS — Z79899 Other long term (current) drug therapy: Secondary | ICD-10-CM | POA: Diagnosis not present

## 2015-06-07 DIAGNOSIS — F418 Other specified anxiety disorders: Secondary | ICD-10-CM | POA: Diagnosis not present

## 2015-06-07 DIAGNOSIS — Z9010 Acquired absence of unspecified breast and nipple: Secondary | ICD-10-CM

## 2015-06-07 DIAGNOSIS — Z170 Estrogen receptor positive status [ER+]: Secondary | ICD-10-CM | POA: Insufficient documentation

## 2015-06-07 DIAGNOSIS — Z79810 Long term (current) use of selective estrogen receptor modulators (SERMs): Secondary | ICD-10-CM | POA: Insufficient documentation

## 2015-06-07 DIAGNOSIS — Z421 Encounter for breast reconstruction following mastectomy: Secondary | ICD-10-CM | POA: Diagnosis present

## 2015-06-07 DIAGNOSIS — Z803 Family history of malignant neoplasm of breast: Secondary | ICD-10-CM | POA: Insufficient documentation

## 2015-06-07 DIAGNOSIS — C50912 Malignant neoplasm of unspecified site of left female breast: Secondary | ICD-10-CM | POA: Insufficient documentation

## 2015-06-07 HISTORY — PX: REMOVAL OF BILATERAL TISSUE EXPANDERS WITH PLACEMENT OF BILATERAL BREAST IMPLANTS: SHX6431

## 2015-06-07 HISTORY — PX: LIPOSUCTION WITH LIPOFILLING: SHX6436

## 2015-06-07 SURGERY — REMOVAL, TISSUE EXPANDER, BREAST, BILATERAL, WITH BILATERAL IMPLANT IMPLANT INSERTION
Anesthesia: General | Site: Chest | Laterality: Bilateral

## 2015-06-07 MED ORDER — MIDAZOLAM HCL 2 MG/2ML IJ SOLN
INTRAMUSCULAR | Status: AC
  Filled 2015-06-07: qty 2

## 2015-06-07 MED ORDER — ONDANSETRON HCL 4 MG/2ML IJ SOLN
INTRAMUSCULAR | Status: DC | PRN
  Administered 2015-06-07: 4 mg via INTRAVENOUS

## 2015-06-07 MED ORDER — VANCOMYCIN HCL IN DEXTROSE 1-5 GM/200ML-% IV SOLN
1000.0000 mg | Freq: Once | INTRAVENOUS | Status: AC
  Administered 2015-06-07: 1000 mg via INTRAVENOUS

## 2015-06-07 MED ORDER — FENTANYL CITRATE (PF) 100 MCG/2ML IJ SOLN
INTRAMUSCULAR | Status: AC
  Filled 2015-06-07: qty 2

## 2015-06-07 MED ORDER — PROPOFOL 10 MG/ML IV BOLUS
INTRAVENOUS | Status: AC
  Filled 2015-06-07: qty 20

## 2015-06-07 MED ORDER — KETOROLAC TROMETHAMINE 30 MG/ML IJ SOLN
30.0000 mg | Freq: Three times a day (TID) | INTRAMUSCULAR | Status: AC
  Administered 2015-06-07 – 2015-06-08 (×3): 30 mg via INTRAVENOUS

## 2015-06-07 MED ORDER — ARTIFICIAL TEARS OP OINT
TOPICAL_OINTMENT | OPHTHALMIC | Status: AC
  Filled 2015-06-07: qty 3.5

## 2015-06-07 MED ORDER — SODIUM CHLORIDE 0.9 % IV SOLN
INTRAVENOUS | Status: DC | PRN
  Administered 2015-06-07: 1000 mL

## 2015-06-07 MED ORDER — OXYCODONE HCL 5 MG PO TABS: 5.0000 mg | ORAL_TABLET | ORAL | Status: DC | PRN

## 2015-06-07 MED ORDER — HYDROMORPHONE HCL 1 MG/ML IJ SOLN
0.5000 mg | INTRAMUSCULAR | Status: DC | PRN
  Administered 2015-06-07 (×2): 1 mg via INTRAVENOUS
  Filled 2015-06-07 (×2): qty 1

## 2015-06-07 MED ORDER — CHLORHEXIDINE GLUCONATE 4 % EX LIQD: 1.0000 "application " | Freq: Once | CUTANEOUS | Status: DC

## 2015-06-07 MED ORDER — PHENYLEPHRINE HCL 10 MG/ML IJ SOLN
INTRAMUSCULAR | Status: AC
  Filled 2015-06-07: qty 1

## 2015-06-07 MED ORDER — KCL IN DEXTROSE-NACL 20-5-0.45 MEQ/L-%-% IV SOLN
INTRAVENOUS | Status: DC
  Filled 2015-06-07: qty 1000

## 2015-06-07 MED ORDER — HYDROMORPHONE HCL 1 MG/ML IJ SOLN
INTRAMUSCULAR | Status: AC
  Filled 2015-06-07: qty 1

## 2015-06-07 MED ORDER — SODIUM BICARBONATE 4 % IV SOLN
INTRAVENOUS | Status: DC | PRN
  Administered 2015-06-07: 600 mL via INTRAMUSCULAR

## 2015-06-07 MED ORDER — LIDOCAINE HCL (CARDIAC) 20 MG/ML IV SOLN
INTRAVENOUS | Status: DC | PRN
  Administered 2015-06-07: 70 mg via INTRAVENOUS

## 2015-06-07 MED ORDER — 0.9 % SODIUM CHLORIDE (POUR BTL) OPTIME
TOPICAL | Status: DC | PRN
  Administered 2015-06-07: 1500 mL

## 2015-06-07 MED ORDER — GLYCOPYRROLATE 0.2 MG/ML IJ SOLN: 0.2000 mg | Freq: Once | INTRAMUSCULAR | Status: DC | PRN

## 2015-06-07 MED ORDER — OXYCODONE HCL 5 MG PO TABS
5.0000 mg | ORAL_TABLET | Freq: Once | ORAL | Status: DC | PRN
  Filled 2015-06-07: qty 1

## 2015-06-07 MED ORDER — PHENYLEPHRINE HCL 10 MG/ML IJ SOLN
10.0000 mg | INTRAVENOUS | Status: DC | PRN
  Administered 2015-06-07: 50 ug/min via INTRAVENOUS

## 2015-06-07 MED ORDER — ONDANSETRON HCL 4 MG/2ML IJ SOLN
INTRAMUSCULAR | Status: AC
  Filled 2015-06-07: qty 2

## 2015-06-07 MED ORDER — SUCCINYLCHOLINE CHLORIDE 20 MG/ML IJ SOLN
INTRAMUSCULAR | Status: DC | PRN
  Administered 2015-06-07: 40 mg via INTRAVENOUS
  Administered 2015-06-07: 60 mg via INTRAVENOUS

## 2015-06-07 MED ORDER — ACETAMINOPHEN 160 MG/5ML PO SOLN: 325.0000 mg | ORAL | Status: DC | PRN

## 2015-06-07 MED ORDER — ONDANSETRON 4 MG PO TBDP: 4.0000 mg | ORAL_TABLET | Freq: Four times a day (QID) | ORAL | Status: DC | PRN

## 2015-06-07 MED ORDER — LACTATED RINGERS IV SOLN
INTRAVENOUS | Status: DC
  Administered 2015-06-07 (×2): via INTRAVENOUS

## 2015-06-07 MED ORDER — CARVEDILOL 6.25 MG PO TABS
6.2500 mg | ORAL_TABLET | Freq: Two times a day (BID) | ORAL | Status: DC
  Administered 2015-06-07: 6.25 mg via ORAL

## 2015-06-07 MED ORDER — CEFAZOLIN SODIUM 1-5 GM-% IV SOLN
1.0000 g | Freq: Three times a day (TID) | INTRAVENOUS | Status: AC
  Administered 2015-06-07 – 2015-06-08 (×3): 1 g via INTRAVENOUS
  Filled 2015-06-07 (×3): qty 50

## 2015-06-07 MED ORDER — PHENYLEPHRINE HCL 10 MG/ML IJ SOLN
INTRAMUSCULAR | Status: DC | PRN
  Administered 2015-06-07 (×4): 40 ug via INTRAVENOUS

## 2015-06-07 MED ORDER — SULFAMETHOXAZOLE-TRIMETHOPRIM 800-160 MG PO TABS: 1.0000 | ORAL_TABLET | Freq: Two times a day (BID) | ORAL | Status: DC

## 2015-06-07 MED ORDER — MIDAZOLAM HCL 2 MG/2ML IJ SOLN
1.0000 mg | INTRAMUSCULAR | Status: DC | PRN
  Administered 2015-06-07: 2 mg via INTRAVENOUS

## 2015-06-07 MED ORDER — HYDROMORPHONE HCL 1 MG/ML IJ SOLN
0.2500 mg | INTRAMUSCULAR | Status: DC | PRN
  Administered 2015-06-07 (×4): 0.5 mg via INTRAVENOUS

## 2015-06-07 MED ORDER — POVIDONE-IODINE 10 % EX SOLN
CUTANEOUS | Status: DC | PRN
  Administered 2015-06-07: 1 via TOPICAL

## 2015-06-07 MED ORDER — LIDOCAINE HCL (PF) 1 % IJ SOLN
INTRAMUSCULAR | Status: AC
  Filled 2015-06-07: qty 30

## 2015-06-07 MED ORDER — FENTANYL CITRATE (PF) 100 MCG/2ML IJ SOLN
50.0000 ug | INTRAMUSCULAR | Status: AC | PRN
  Administered 2015-06-07 (×3): 25 ug via INTRAVENOUS
  Administered 2015-06-07 (×2): 50 ug via INTRAVENOUS
  Administered 2015-06-07: 25 ug via INTRAVENOUS

## 2015-06-07 MED ORDER — VANCOMYCIN HCL IN DEXTROSE 1-5 GM/200ML-% IV SOLN
INTRAVENOUS | Status: AC
  Filled 2015-06-07: qty 200

## 2015-06-07 MED ORDER — DEXAMETHASONE SODIUM PHOSPHATE 10 MG/ML IJ SOLN
INTRAMUSCULAR | Status: AC
  Filled 2015-06-07: qty 1

## 2015-06-07 MED ORDER — EPINEPHRINE HCL 1 MG/ML IJ SOLN
INTRAMUSCULAR | Status: AC
  Filled 2015-06-07: qty 1

## 2015-06-07 MED ORDER — ONDANSETRON HCL 4 MG/2ML IJ SOLN: 4.0000 mg | Freq: Four times a day (QID) | INTRAMUSCULAR | Status: DC | PRN

## 2015-06-07 MED ORDER — SODIUM BICARBONATE 4 % IV SOLN
INTRAVENOUS | Status: AC
  Filled 2015-06-07: qty 5

## 2015-06-07 MED ORDER — VENLAFAXINE HCL ER 75 MG PO CP24
75.0000 mg | ORAL_CAPSULE | Freq: Every day | ORAL | Status: DC
  Administered 2015-06-07: 75 mg via ORAL

## 2015-06-07 MED ORDER — OXYCODONE HCL 5 MG/5ML PO SOLN: 5.0000 mg | Freq: Once | ORAL | Status: DC | PRN

## 2015-06-07 MED ORDER — DEXAMETHASONE SODIUM PHOSPHATE 4 MG/ML IJ SOLN
INTRAMUSCULAR | Status: DC | PRN
  Administered 2015-06-07: 10 mg via INTRAVENOUS

## 2015-06-07 MED ORDER — PROPOFOL 10 MG/ML IV BOLUS
INTRAVENOUS | Status: DC | PRN
  Administered 2015-06-07: 150 mg via INTRAVENOUS
  Administered 2015-06-07: 20 mg via INTRAVENOUS

## 2015-06-07 MED ORDER — LACTATED RINGERS IV SOLN: 500.0000 mL | INTRAVENOUS | Status: DC

## 2015-06-07 MED ORDER — SUCCINYLCHOLINE CHLORIDE 20 MG/ML IJ SOLN
INTRAMUSCULAR | Status: AC
  Filled 2015-06-07: qty 1

## 2015-06-07 MED ORDER — BUPIVACAINE-EPINEPHRINE (PF) 0.25% -1:200000 IJ SOLN
INTRAMUSCULAR | Status: AC
  Filled 2015-06-07: qty 30

## 2015-06-07 MED ORDER — KETOROLAC TROMETHAMINE 30 MG/ML IJ SOLN
INTRAMUSCULAR | Status: AC
  Filled 2015-06-07: qty 1

## 2015-06-07 MED ORDER — OXYCODONE HCL 5 MG PO TABS
5.0000 mg | ORAL_TABLET | ORAL | Status: DC | PRN
  Administered 2015-06-07 – 2015-06-08 (×4): 10 mg via ORAL
  Filled 2015-06-07 (×3): qty 1
  Filled 2015-06-07 (×2): qty 2

## 2015-06-07 MED ORDER — TAMOXIFEN CITRATE 20 MG PO TABS
20.0000 mg | ORAL_TABLET | Freq: Every day | ORAL | Status: DC
  Administered 2015-06-07: 20 mg via ORAL

## 2015-06-07 MED ORDER — ATROPINE SULFATE 0.4 MG/ML IJ SOLN
INTRAMUSCULAR | Status: AC
  Filled 2015-06-07: qty 1

## 2015-06-07 MED ORDER — IRBESARTAN 75 MG PO TABS
37.5000 mg | ORAL_TABLET | Freq: Every day | ORAL | Status: DC
  Administered 2015-06-07: 37.5 mg via ORAL

## 2015-06-07 MED ORDER — SCOPOLAMINE 1 MG/3DAYS TD PT72: 1.0000 | MEDICATED_PATCH | Freq: Once | TRANSDERMAL | Status: DC | PRN

## 2015-06-07 MED ORDER — LIDOCAINE HCL (CARDIAC) 20 MG/ML IV SOLN
INTRAVENOUS | Status: AC
  Filled 2015-06-07: qty 5

## 2015-06-07 MED ORDER — ACETAMINOPHEN 325 MG PO TABS: 325.0000 mg | ORAL_TABLET | ORAL | Status: DC | PRN

## 2015-06-07 MED ORDER — ARTIFICIAL TEARS OP OINT
TOPICAL_OINTMENT | OPHTHALMIC | Status: DC | PRN
  Administered 2015-06-07: 1 via OPHTHALMIC

## 2015-06-07 SURGICAL SUPPLY — 91 items
BAG DECANTER FOR FLEXI CONT (MISCELLANEOUS) ×3 IMPLANT
BINDER ABDOMINAL 10 UNV 27-48 (MISCELLANEOUS) ×1 IMPLANT
BINDER ABDOMINAL 12 SM 30-45 (SOFTGOODS) IMPLANT
BINDER BREAST LRG (GAUZE/BANDAGES/DRESSINGS) IMPLANT
BINDER BREAST MEDIUM (GAUZE/BANDAGES/DRESSINGS) IMPLANT
BINDER BREAST XLRG (GAUZE/BANDAGES/DRESSINGS) ×1 IMPLANT
BINDER BREAST XXLRG (GAUZE/BANDAGES/DRESSINGS) IMPLANT
BLADE SURG 10 STRL SS (BLADE) ×1 IMPLANT
BLADE SURG 11 STRL SS (BLADE) ×3 IMPLANT
BLADE SURG 15 STRL LF DISP TIS (BLADE) ×2 IMPLANT
BLADE SURG 15 STRL SS (BLADE) ×3
BNDG GAUZE ELAST 4 BULKY (GAUZE/BANDAGES/DRESSINGS) ×4 IMPLANT
CANISTER LIPO FAT HARVEST (MISCELLANEOUS) ×2 IMPLANT
CANISTER SUCT 1200ML W/VALVE (MISCELLANEOUS) ×4 IMPLANT
CHLORAPREP W/TINT 26ML (MISCELLANEOUS) ×4 IMPLANT
COMPRESSION GARMENT LG MOREWEL (MISCELLANEOUS) IMPLANT
COMPRESSION GARMENT MD MOREWEL (MISCELLANEOUS) IMPLANT
COMPRESSION GARMENT XL MOREWEL (MISCELLANEOUS) IMPLANT
COVER BACK TABLE 60X90IN (DRAPES) ×3 IMPLANT
COVER MAYO STAND STRL (DRAPES) ×3 IMPLANT
DECANTER SPIKE VIAL GLASS SM (MISCELLANEOUS) IMPLANT
DRAIN CHANNEL 15F RND FF W/TCR (WOUND CARE) ×4 IMPLANT
DRAPE LAPAROSCOPIC ABDOMINAL (DRAPES) ×2 IMPLANT
DRAPE U 20/CS (DRAPES) IMPLANT
DRSG PAD ABDOMINAL 8X10 ST (GAUZE/BANDAGES/DRESSINGS) ×6 IMPLANT
ELECT BLADE 4.0 EZ CLEAN MEGAD (MISCELLANEOUS) ×3
ELECT COATED BLADE 2.86 ST (ELECTRODE) ×3 IMPLANT
ELECT REM PT RETURN 9FT ADLT (ELECTROSURGICAL) ×3
ELECTRODE BLDE 4.0 EZ CLN MEGD (MISCELLANEOUS) ×2 IMPLANT
ELECTRODE REM PT RTRN 9FT ADLT (ELECTROSURGICAL) ×2 IMPLANT
EVACUATOR SILICONE 100CC (DRAIN) ×4 IMPLANT
FILTER LIPOSUCTION (MISCELLANEOUS) ×3 IMPLANT
GLOVE BIO SURGEON STRL SZ 6 (GLOVE) ×4 IMPLANT
GLOVE BIOGEL M 7.0 STRL (GLOVE) ×1 IMPLANT
GLOVE BIOGEL PI IND STRL 6.5 (GLOVE) IMPLANT
GLOVE BIOGEL PI IND STRL 7.0 (GLOVE) IMPLANT
GLOVE BIOGEL PI IND STRL 7.5 (GLOVE) IMPLANT
GLOVE BIOGEL PI INDICATOR 6.5 (GLOVE)
GLOVE BIOGEL PI INDICATOR 7.0 (GLOVE) ×1
GLOVE BIOGEL PI INDICATOR 7.5 (GLOVE) ×1
GLOVE EXAM NITRILE MD LF STRL (GLOVE) ×2 IMPLANT
GLOVE SURG SS PI 7.0 STRL IVOR (GLOVE) ×2 IMPLANT
GOWN STRL REUS W/ TWL LRG LVL3 (GOWN DISPOSABLE) ×4 IMPLANT
GOWN STRL REUS W/TWL LRG LVL3 (GOWN DISPOSABLE) ×12
IMPLANT SILTEX 430CC (Breast) ×2 IMPLANT
IV NS 500ML (IV SOLUTION)
IV NS 500ML BAXH (IV SOLUTION) IMPLANT
KIT FILL SYSTEM UNIVERSAL (SET/KITS/TRAYS/PACK) IMPLANT
LINER CANISTER 1000CC FLEX (MISCELLANEOUS) ×3 IMPLANT
LIQUID BAND (GAUZE/BANDAGES/DRESSINGS) ×5 IMPLANT
NDL HYPO 25X1 1.5 SAFETY (NEEDLE) IMPLANT
NDL SAFETY ECLIPSE 18X1.5 (NEEDLE) IMPLANT
NEEDLE HYPO 18GX1.5 SHARP (NEEDLE) ×3
NEEDLE HYPO 25X1 1.5 SAFETY (NEEDLE) IMPLANT
NS IRRIG 1000ML POUR BTL (IV SOLUTION) ×3 IMPLANT
PACK BASIN DAY SURGERY FS (CUSTOM PROCEDURE TRAY) ×3 IMPLANT
PACK UNIVERSAL I (CUSTOM PROCEDURE TRAY) IMPLANT
PAD ALCOHOL SWAB (MISCELLANEOUS) ×3 IMPLANT
PENCIL BUTTON HOLSTER BLD 10FT (ELECTRODE) ×3 IMPLANT
PIN SAFETY STERILE (MISCELLANEOUS) ×3 IMPLANT
SHEET MEDIUM DRAPE 40X70 STRL (DRAPES) ×7 IMPLANT
SIZER BREAST REUSE 425CC (SIZER) ×3
SIZER BREAST REUSE 455CC (SIZER) ×3
SIZER BRST REUSE 425CC (SIZER) IMPLANT
SIZER BRST REUSE 455CC (SIZER) IMPLANT
SIZER GENERIC MENTOR (SIZER) ×2 IMPLANT
SLEEVE SCD COMPRESS KNEE MED (MISCELLANEOUS) ×3 IMPLANT
SPONGE LAP 18X18 X RAY DECT (DISPOSABLE) ×7 IMPLANT
STAPLER VISISTAT 35W (STAPLE) ×3 IMPLANT
SUT ETHILON 2 0 FS 18 (SUTURE) ×4 IMPLANT
SUT MNCRL AB 4-0 PS2 18 (SUTURE) ×6 IMPLANT
SUT PDS 3-0 CT2 (SUTURE)
SUT PDS AB 2-0 CT2 27 (SUTURE) IMPLANT
SUT PDS II 3-0 CT2 27 ABS (SUTURE) IMPLANT
SUT VIC AB 3-0 PS1 18 (SUTURE)
SUT VIC AB 3-0 PS1 18XBRD (SUTURE) IMPLANT
SUT VIC AB 3-0 SH 27 (SUTURE) ×6
SUT VIC AB 3-0 SH 27X BRD (SUTURE) ×4 IMPLANT
SUT VICRYL 4-0 PS2 18IN ABS (SUTURE) ×6 IMPLANT
SYR 20CC LL (SYRINGE) IMPLANT
SYR 50ML LL SCALE MARK (SYRINGE) ×6 IMPLANT
SYR BULB IRRIGATION 50ML (SYRINGE) ×3 IMPLANT
SYR CONTROL 10ML LL (SYRINGE) IMPLANT
SYR TB 1ML LL NO SAFETY (SYRINGE) ×4 IMPLANT
SYRINGE 10CC LL (SYRINGE) ×12 IMPLANT
TOWEL OR 17X24 6PK STRL BLUE (TOWEL DISPOSABLE) ×6 IMPLANT
TUBE CONNECTING 20X1/4 (TUBING) ×3 IMPLANT
TUBING INFILTRATION IT-10001 (TUBING) ×3 IMPLANT
TUBING SET GRADUATE ASPIR 12FT (MISCELLANEOUS) ×3 IMPLANT
UNDERPAD 30X30 (UNDERPADS AND DIAPERS) ×6 IMPLANT
YANKAUER SUCT BULB TIP NO VENT (SUCTIONS) ×3 IMPLANT

## 2015-06-07 NOTE — Anesthesia Procedure Notes (Signed)
Procedure Name: Intubation Date/Time: 06/07/2015 7:42 AM Performed by: Baxter Flattery Pre-anesthesia Checklist: Patient identified, Emergency Drugs available, Suction available and Patient being monitored Patient Re-evaluated:Patient Re-evaluated prior to inductionOxygen Delivery Method: Circle System Utilized Preoxygenation: Pre-oxygenation with 100% oxygen Intubation Type: IV induction Ventilation: Mask ventilation without difficulty Laryngoscope Size: Miller and 2 Grade View: Grade I Tube type: Oral Tube size: 7.0 mm Number of attempts: 1 Airway Equipment and Method: Stylet Placement Confirmation: ETT inserted through vocal cords under direct vision,  positive ETCO2 and breath sounds checked- equal and bilateral Secured at: 22 cm Tube secured with: Tape Dental Injury: Teeth and Oropharynx as per pre-operative assessment

## 2015-06-07 NOTE — Interval H&P Note (Signed)
History and Physical Interval Note:  06/07/2015 6:58 AM  Natasha Torres  has presented today for surgery, with the diagnosis of HISTORY OF BREAST CANCER,ACQUIRED ABSENT BREAST AND GENETIC PRE-DISPOSTION TO BREAST CANCER  The various methods of treatment have been discussed with the patient and family. After consideration of risks, benefits and other options for treatment, the patient has consented to  Procedure(s): REMOVAL OF BILATERAL TISSUE EXPANDERS WITH PLACEMENT OF BILATERAL BREAST  SILICONE IMPLANTS (Bilateral) LIPOSUCTION WITH LIPOFILLING TO BILATERAL CHEST (Bilateral) as a surgical intervention .  The patient's history has been reviewed, patient examined, no change in status, stable for surgery.  I have reviewed the patient's chart and labs.  Questions were answered to the patient's satisfaction.     Journe Hallmark

## 2015-06-07 NOTE — Op Note (Signed)
Operative Note   DATE OF OPERATION: 12.20.16  LOCATION: [place]- observation  SURGICAL DIVISION: Plastic Surgery  PREOPERATIVE DIAGNOSES:  1. Left breast cancer 2. 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 Removal bilateral expanders and placement silicone implants 2. Lipofilling to bilateral chest  SURGEON: Irene Limbo MD MBA  ASSISTANT: none  ANESTHESIA:  General.   EBL: 50 ml  COMPLICATIONS: None immediate.   INDICATIONS FOR PROCEDURE:  The patient, Natasha Torres, is a 34 y.o. female born on [birth year], is here for placement implants following neoadjuvant chemotherapy, nipple sparing mastectomies and immediate reconstruction with tissue expanders. Her course has been complicated by recurrent seroma right reconstruction. She has had prior reoperation bilateral for drainage seroma, replacement expanders, and removal acellular dermis.   FINDINGS: Mentor Siltex Ultra High Profile 430 ml implants placed bilateral, Ref [id number] RIGHT SN [id number]. LEFT [id number]. 110 ml fat infiltrated over right reconstruction, 80 ml over left. There was concern for rupture of right tissue expander during in office seroma aspiration, however presented with expander inflated bilateral.   DESCRIPTION OF PROCEDURE:  The patient's operative site was marked with the patient in the preoperative area to mark sternal notch, chest midline, anterior axillary lines, inframammary folds and areas of depression marked for lipofilling. Area for fat harvest from supra and infra umbilical abdomen and bilateral flanks marked.The patient was taken to the operating room. SCDs were placed and IV antibiotics were given. A time out was performed and all information was confirmed to be correct. The patient's chest and abdomen were then prepped and draped in a sterile fashion.  Stab incision made over bilateral lateral abdomen and  tumescent fluid infiltrated over supraumbilical abdomen and bilateral flanks, total 600 ml tumescent infiltrated. Power assisted liposuction performed to endpoint symmetric contour and soft tissue thickness. The fat was then washed and prepared by gravity for infiltration. Incision made in right chest inframammary fold mastectomy scar. Incision carried through to capsule and expander removed. Dissection completed superiorly and medially elevating pectoralis muscle.Capsulectomies performed over anterior surface in all area except lower outer pole where no muscle coverage. Curetage performed over posterior or chest wall surface. Cavity irrigated with one liter saline and inspected for hemostasis. Sizer was placed, and I then directed attention to left breast. Incision made in prior scar and expander removed. Capsulotomies performed medially and superiorly and submuscular dissection completed toward clavicle. Sizer placed and patient brought to upright sitting position. A Mentor Siltex Ultra High Profile Round 430 ml implant selected. Patient returned to supine position and harvested fat was then infiltrated in subcutaneous and intramuscular planes over superior medial poles, lateral chest wall, and throughout bilateral mastectomy flaps.   At this time bilateral cavities irrigated with solution containing Ancef, gentamicin, and bacitracin. Right breast also irrigated with betadine. 15 Fr drains placed in right and left breast cavity and secured with 2-0 nylon. Implant placed in each cavity. Closure was completed with running 3-0 vicryl for approximation of capsule. 4-0 vicryl was placed in dermis and running 4-0 monocryl was used to close skin. Tissue adhesive was applied to IMF incisions. The abdominal incisions were approximated with 4-0 monocryl. Dry dressing and abdominal and breast binder applied.   The patient was allowed to wake from anesthesia, extubated and taken to the recovery room in satisfactory  condition.   SPECIMENS: none  DRAINS: 15 Fr JP in right and left reconstructed breast  Irene Limbo, MD [facility] Plastic & Reconstructive  Surgery [phone]

## 2015-06-07 NOTE — Transfer of Care (Signed)
Immediate Anesthesia Transfer of Care Note  Patient: Natasha Torres  Procedure(s) Performed: Procedure(s): REMOVAL OF BILATERAL TISSUE EXPANDERS WITH PLACEMENT OF BILATERAL BREAST  SILICONE IMPLANTS (Bilateral) LIPOSUCTION WITH LIPOFILLING TO BILATERAL CHEST (Bilateral)  Patient Location: PACU  Anesthesia Type:General  Level of Consciousness: awake, alert , oriented and patient cooperative  Airway & Oxygen Therapy: Patient Spontanous Breathing and Patient connected to face mask oxygen  Post-op Assessment: Report given to RN, Post -op Vital signs reviewed and stable and Patient moving all extremities  Post vital signs: Reviewed and stable  Last Vitals:  Filed Vitals:   06/07/15 0633  BP: 111/61  Pulse: 74  Temp: 36.9 C  Resp: 20    Complications: No apparent anesthesia complications

## 2015-06-07 NOTE — Anesthesia Postprocedure Evaluation (Signed)
Anesthesia Post Note  Patient: Natasha Torres  Procedure(s) Performed: Procedure(s) (LRB): REMOVAL OF BILATERAL TISSUE EXPANDERS WITH PLACEMENT OF BILATERAL BREAST  SILICONE IMPLANTS (Bilateral) LIPOSUCTION WITH LIPOFILLING TO BILATERAL CHEST (Bilateral)  Patient location during evaluation: PACU Anesthesia Type: General Level of consciousness: awake and alert Pain management: pain level controlled Vital Signs Assessment: post-procedure vital signs reviewed and stable Respiratory status: spontaneous breathing, nonlabored ventilation and respiratory function stable Cardiovascular status: blood pressure returned to baseline and stable Postop Assessment: no signs of nausea or vomiting Anesthetic complications: no    Last Vitals:  Filed Vitals:   06/07/15 1230 06/07/15 1245  BP: 105/63 104/60  Pulse: 82 81  Temp:  37.2 C  Resp: 17 18    Last Pain:  Filed Vitals:   06/07/15 1356  PainSc: 7                  Diyana Starrett A

## 2015-06-07 NOTE — Anesthesia Preprocedure Evaluation (Signed)
Anesthesia Evaluation  Patient identified by MRN, date of birth, ID band Patient awake    Reviewed: Allergy & Precautions, NPO status , Patient's Chart, lab work & pertinent test results, reviewed documented beta blocker date and time   History of Anesthesia Complications Negative for: history of anesthetic complications  Airway Mallampati: I  TM Distance: >3 FB Neck ROM: Full    Dental  (+) Teeth Intact   Pulmonary neg pulmonary ROS,    breath sounds clear to auscultation       Cardiovascular hypertension, Pt. on medications and Pt. on home beta blockers  Rhythm:Regular     Neuro/Psych PSYCHIATRIC DISORDERS Anxiety Depression negative neurological ROS     GI/Hepatic negative GI ROS, Neg liver ROS,   Endo/Other  negative endocrine ROS  Renal/GU negative Renal ROS     Musculoskeletal   Abdominal   Peds  Hematology  (+) anemia ,   Anesthesia Other Findings   Reproductive/Obstetrics                             Anesthesia Physical Anesthesia Plan  ASA: II  Anesthesia Plan: General   Post-op Pain Management:    Induction: Intravenous  Airway Management Planned: Oral ETT  Additional Equipment: None  Intra-op Plan:   Post-operative Plan: Extubation in OR  Informed Consent: I have reviewed the patients History and Physical, chart, labs and discussed the procedure including the risks, benefits and alternatives for the proposed anesthesia with the patient or authorized representative who has indicated his/her understanding and acceptance.   Dental advisory given  Plan Discussed with: CRNA and Surgeon  Anesthesia Plan Comments:         Anesthesia Quick Evaluation

## 2015-06-08 ENCOUNTER — Encounter (HOSPITAL_BASED_OUTPATIENT_CLINIC_OR_DEPARTMENT_OTHER): Payer: Self-pay | Admitting: Plastic Surgery

## 2015-06-08 DIAGNOSIS — Z421 Encounter for breast reconstruction following mastectomy: Secondary | ICD-10-CM | POA: Diagnosis not present

## 2015-06-08 MED ORDER — KETOROLAC TROMETHAMINE 30 MG/ML IJ SOLN
INTRAMUSCULAR | Status: AC
  Filled 2015-06-08: qty 1

## 2015-06-08 NOTE — Discharge Instructions (Signed)

## 2015-06-14 ENCOUNTER — Other Ambulatory Visit (HOSPITAL_COMMUNITY): Payer: Self-pay | Admitting: Cardiology

## 2015-06-22 MED FILL — oxyCODONE HCL 5 MG TABS: 5 | 2 days supply | Qty: 10 | Fill #0

## 2015-06-27 ENCOUNTER — Ambulatory Visit (HOSPITAL_BASED_OUTPATIENT_CLINIC_OR_DEPARTMENT_OTHER): Payer: Managed Care, Other (non HMO)

## 2015-06-27 ENCOUNTER — Ambulatory Visit: Payer: Managed Care, Other (non HMO)

## 2015-06-27 ENCOUNTER — Ambulatory Visit: Payer: Self-pay

## 2015-06-27 ENCOUNTER — Ambulatory Visit (HOSPITAL_BASED_OUTPATIENT_CLINIC_OR_DEPARTMENT_OTHER): Payer: Managed Care, Other (non HMO) | Admitting: Hematology and Oncology

## 2015-06-27 ENCOUNTER — Encounter: Payer: Self-pay | Admitting: Hematology and Oncology

## 2015-06-27 VITALS — BP 116/70 | HR 78 | Temp 98.6°F | Resp 18 | Wt 184.0 lb

## 2015-06-27 DIAGNOSIS — F329 Major depressive disorder, single episode, unspecified: Secondary | ICD-10-CM

## 2015-06-27 DIAGNOSIS — Z5111 Encounter for antineoplastic chemotherapy: Secondary | ICD-10-CM | POA: Diagnosis not present

## 2015-06-27 DIAGNOSIS — R635 Abnormal weight gain: Secondary | ICD-10-CM | POA: Diagnosis not present

## 2015-06-27 DIAGNOSIS — L658 Other specified nonscarring hair loss: Secondary | ICD-10-CM

## 2015-06-27 DIAGNOSIS — Z5112 Encounter for antineoplastic immunotherapy: Secondary | ICD-10-CM | POA: Diagnosis not present

## 2015-06-27 DIAGNOSIS — C50412 Malignant neoplasm of upper-outer quadrant of left female breast: Secondary | ICD-10-CM | POA: Diagnosis not present

## 2015-06-27 DIAGNOSIS — N951 Menopausal and female climacteric states: Secondary | ICD-10-CM | POA: Diagnosis not present

## 2015-06-27 MED ORDER — ACETAMINOPHEN 325 MG PO TABS
650.0000 mg | ORAL_TABLET | Freq: Once | ORAL | Status: AC
  Administered 2015-06-27: 650 mg via ORAL

## 2015-06-27 MED ORDER — HEPARIN SOD (PORK) LOCK FLUSH 100 UNIT/ML IV SOLN
500.0000 [IU] | Freq: Once | INTRAVENOUS | Status: AC | PRN
  Administered 2015-06-27: 500 [IU]
  Filled 2015-06-27: qty 5

## 2015-06-27 MED ORDER — TRASTUZUMAB CHEMO INJECTION 440 MG
6.0000 mg/kg | Freq: Once | INTRAVENOUS | Status: AC
  Administered 2015-06-27: 504 mg via INTRAVENOUS
  Filled 2015-06-27: qty 24

## 2015-06-27 MED ORDER — ACETAMINOPHEN 325 MG PO TABS
ORAL_TABLET | ORAL | Status: AC
  Filled 2015-06-27: qty 2

## 2015-06-27 MED ORDER — SODIUM CHLORIDE 0.9 % IJ SOLN
10.0000 mL | INTRAMUSCULAR | Status: DC | PRN
  Administered 2015-06-27: 10 mL
  Filled 2015-06-27: qty 10

## 2015-06-27 MED ORDER — NALTREXONE-BUPROPION HCL ER 8-90 MG PO TB12: ORAL_TABLET | ORAL | Status: DC

## 2015-06-27 MED ORDER — GOSERELIN ACETATE 3.6 MG ~~LOC~~ IMPL
3.6000 mg | DRUG_IMPLANT | Freq: Once | SUBCUTANEOUS | Status: AC
  Administered 2015-06-27: 3.6 mg via SUBCUTANEOUS
  Filled 2015-06-27: qty 3.6

## 2015-06-27 MED ORDER — SODIUM CHLORIDE 0.9 % IV SOLN
Freq: Once | INTRAVENOUS | Status: AC
  Administered 2015-06-27: 10:00:00 via INTRAVENOUS

## 2015-06-27 MED FILL — CONTRAVE ER 8-90 MG TABLET: 8-90 | 30 days supply | Qty: 120 | Fill #0

## 2015-06-27 MED FILL — CANDESARTAN CILEXETIL 4 MG: 4 | 30 days supply | Qty: 60 | Fill #1

## 2015-06-27 NOTE — Assessment & Plan Note (Signed)
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 Biopsy of the right breast mass and left axillary lymph node and the secondary mass left breast back as benign. Status post neo-adjuvant TCH Perjeta 6 cycles from 09/27/2014 to 01/10/2015 Followed by bilateral mastectomies on 02/10/2015: No malignancy in either breast ----------------------------------------------------------------------------------------------------------------------------------------------------- Plan: 1. On maintenance Herceptin to complete 1 year of therapy. Previously Herceptin is on hold because of decline in ejection fraction. Echocardiogram on 02/03/2015 showed an ejection fraction of 45%. This has dropped from 55% previously. Dr. Kirk Ruths is monitoring her. Repeat echo 05/16/2015 showed EF 50%. 2. On anti-estrogen therapy with tamoxifen with Zoladex for ovarian suppression since 02/22/15.  Tamoxifen and Zoladex toxicities: 1. Hot flashes: Minimal, one episode of severe sweats: improved by Effexor 75 mg daily 2. Depression symptoms improved with Effexor. Patient is going to a lot of personal issues. She is getting a divorce.   Right breast cellulitis: Treated with Bactrim Neutropenia: Resolved (felt to be due to Bactrim)  Return to clinic in 6 weeks for follow-up on Herceptin

## 2015-06-27 NOTE — Addendum Note (Signed)
Addended by: Prentiss Bells on: 06/27/2015 10:31 AM   Modules accepted: Medications

## 2015-06-27 NOTE — Progress Notes (Signed)
Zoladex injection given by infusion nurse after chemotherapy.

## 2015-06-27 NOTE — Progress Notes (Signed)
Patient Care Team: Loraine Leriche, MD as PCP - General (Internal Medicine)  DIAGNOSIS: No matching staging information was found for the patient.  SUMMARY OF ONCOLOGIC HISTORY:   Breast cancer of upper-outer quadrant of left female breast ([place])   08/27/2014 Initial Diagnosis Excisional biopsy: 2 lumps showing 1.5 cm and 0.9 cm IDC, Grade 3, ER Pos, PR Neg and HER-2 amplified Ratio 2.6, Multifocal   09/27/2014 - 01/10/2015 Neo-Adjuvant Chemotherapy Neoadjuvant TCH Perjeta every 3 week 6 followed by Herceptin maintenance   01/13/2015 Breast MRI Postsurgical changes in left breast without residual enhancing masses compatible with treatment response   02/10/2015 Surgery right mastectomy: No malignancy, 0/3 sentinel nodes, complete response to chemotherapy   02/22/2015 -  Anti-estrogen oral therapy Zoladex with tamoxifen    CHIEF COMPLIANT: follow-up on Herceptin, Zoladex and tamoxifen  INTERVAL HISTORY: Natasha Torres is a 35 year old with above-mentioned history of left breast cancer currently on Herceptin and antiestrogen therapy. She is tolerating Herceptin fairly well. She has had hot flashes related to antiestrogen treatment but they have gotten better. She attributes the hot flashes to the combination of tamoxifen with oxycodone. She is no longer taking any oxycodone or heart flashes have improved.she is very concerned about her weight gain issues since chemotherapy she had gained 30 pounds and would like to lose it but is unable to do so. She is requesting additional help with the help of medication like Contrave. She is also complaining of loss of eyelashes.  REVIEW OF SYSTEMS:   Constitutional: Denies fevers, chills or abnormal weight loss Eyes: Denies blurriness of vision Ears, nose, mouth, throat, and face: Denies mucositis or sore throat Respiratory: Denies cough, dyspnea or wheezes Cardiovascular: Denies palpitation, chest discomfort Gastrointestinal:  Denies nausea,  heartburn or change in bowel habits Skin: Denies abnormal skin rashes Lymphatics: Denies new lymphadenopathy or easy bruising Neurological:Denies numbness, tingling or new weaknesses Behavioral/Psych: Mood is stable, no new changes  Extremities: No lower extremity edema Breast: completed reconstructionbreasts All other systems were reviewed with the patient and are negative.  I have reviewed the past medical history, past surgical history, social history and family history with the patient and they are unchanged from previous note.  ALLERGIES:  is allergic to buprenorphine hcl; morphine and related; and penicillins.  MEDICATIONS:  Current Outpatient Prescriptions  Medication Sig Dispense Refill  . BIOTIN PO Take 2 tablets by mouth daily.    . candesartan (ATACAND) 4 MG tablet Take 1 tablet (4 mg total) by mouth 2 (two) times daily. 60 tablet 6  . carvedilol (COREG) 6.25 MG tablet TAKE 1 TABLET BY MOUTH TWICE A DAY WITH A MEAL 60 tablet 3  . docusate sodium (COLACE) 100 MG capsule Take 100 mg by mouth daily as needed for mild constipation.    [name] ibuprofen (ADVIL,MOTRIN) 200 MG tablet Take 200-400 mg by mouth every 6 (six) hours as needed for headache, mild pain or moderate pain.    [name] lidocaine-prilocaine (EMLA) cream Apply 1 application topically as needed. 30 g 6  . LORazepam (ATIVAN) 0.5 MG tablet TAKE 1 TABLET BY MOUTH EVERY 8 HOURS AS NEEDED 60 tablet 0  . Multiple Vitamin (MULTIVITAMIN) tablet Take 2 tablets by mouth daily.     [name] oxyCODONE (ROXICODONE) 5 MG immediate release tablet Take 1-2 tablets (5-10 mg total) by mouth every 4 (four) hours as needed for severe pain. 50 tablet 0  . sulfamethoxazole-trimethoprim (BACTRIM DS,SEPTRA DS) 800-160 MG tablet Take 1 tablet by mouth 2 (two)  times daily. 14 tablet 0  . tamoxifen (NOLVADEX) 20 MG tablet Take 1 tablet (20 mg total) by mouth daily. 90 tablet 3  . Trastuzumab (HERCEPTIN IV) Inject into the vein.    [name] venlafaxine XR (EFFEXOR-XR)  75 MG 24 hr capsule Take 1 capsule (75 mg total) by mouth daily with breakfast. 30 capsule 6  . zolpidem (AMBIEN) 5 MG tablet Take 1 tablet (5 mg total) by mouth at bedtime as needed for sleep. 30 tablet 0   No current facility-administered medications for this visit.    PHYSICAL EXAMINATION: ECOG PERFORMANCE STATUS: 1 - Symptomatic but completely ambulatory  Filed Vitals:   06/27/15 0855  BP: 116/70  Pulse: 78  Temp: 98.6 F (37 C)  Resp: 18   Filed Weights   06/27/15 0855  Weight: 184 lb (83.462 kg)    GENERAL:alert, no distress and comfortable SKIN: skin color, texture, turgor are normal, no rashes or significant lesions EYES: normal, Conjunctiva are pink and non-injected, sclera clear OROPHARYNX:no exudate, no erythema and lips, buccal mucosa, and tongue normal  NECK: supple, thyroid normal size, non-tender, without nodularity LYMPH:  no palpable lymphadenopathy in the cervical, axillary or inguinal LUNGS: clear to auscultation and percussion with normal breathing effort HEART: regular rate & rhythm and no murmurs and no lower extremity edema ABDOMEN:abdomen soft, non-tender and normal bowel sounds MUSCULOSKELETAL:no cyanosis of digits and no clubbing  NEURO: alert & oriented x 3 with fluent speech, no focal motor/sensory deficits EXTREMITIES: No lower extremity edema  LABORATORY DATA:  I have reviewed the data as listed   Chemistry      Component Value Date/Time   NA 136 06/06/2015 1245   NA 133* 02/27/2015 1135   K 3.9 06/06/2015 1245   K 3.7 02/27/2015 1135   CL 99* 02/27/2015 1135   CO2 25 06/06/2015 1245   CO2 25 02/27/2015 1135   BUN 16.1 06/06/2015 1245   BUN 16 02/27/2015 1135   CREATININE 0.8 06/06/2015 1245   CREATININE 0.81 02/27/2015 1135      Component Value Date/Time   CALCIUM 9.0 06/06/2015 1245   CALCIUM 8.8* 02/27/2015 1135   ALKPHOS 58 06/06/2015 1245   ALKPHOS 41 02/27/2015 1135   AST 27 06/06/2015 1245   AST 24 02/27/2015 1135   ALT  15 06/06/2015 1245   ALT 13* 02/27/2015 1135   BILITOT <0.30 06/06/2015 1245   BILITOT 0.5 02/27/2015 1135       Lab Results  Component Value Date   WBC 3.8* 06/06/2015   HGB 10.2* 06/06/2015   HCT 31.7* 06/06/2015   MCV 86.8 06/06/2015   PLT 195 06/06/2015   NEUTROABS 1.9 06/06/2015   ASSESSMENT & PLAN:  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 Biopsy of the right breast mass and left axillary lymph node and the secondary mass left breast back as benign. Status post neo-adjuvant TCH Perjeta 6 cycles from 09/27/2014 to 01/10/2015 Followed by bilateral mastectomies on 02/10/2015: No malignancy in either breast ----------------------------------------------------------------------------------------------------------------------------------------------------- Plan: 1. On maintenance Herceptin to complete 1 year of therapy. Previously Herceptin is on hold because of decline in ejection fraction. Echocardiogram on 02/03/2015 showed  an ejection fraction of 45%. This has dropped from 55% previously. Dr. Kirk Ruths is monitoring her. Repeat echo 05/16/2015 showed EF 50%. 2. On anti-estrogen therapy with tamoxifen with Zoladex for ovarian suppression since 02/22/15.  Tamoxifen and Zoladex toxicities: 1. Hot flashes: Minimal, one episode of severe sweats: improved by Effexor 75 mg daily 2. Depression symptoms improved with Effexor. Patient is going to a lot of personal issues. She is getting a divorce. Her financial situation has improved slightly since she started receiving childcare benefit there she is also going back to  working full-time and that's helping her financially as well.  Right breast cellulitis: resolved Neutropenia: Resolved (felt to be due to Bactrim)  Weight issues: Patient would like me to prescribe Contrave ( bupropion plus naltrexone) for weight loss. I discussed with her that there is interaction with tamoxifen making tamoxifen less effective. She wants to use this for the next 3 months fully understanding the tamoxifen may have lower benefit. I discussed with her that there is some suicidal ideation on the drug as well as the potential black box warnings. Since she is on Effexor I instructed her to discontinue the Effexor since I do not want her to take this in combination with the Contrave.  Loss of eyelashes: Patient is very concerned about it. Discussed that we might consider discontinuing Zolodex if they continue to be a problem.  Return to clinic in 6 weeks for follow-up on Herceptin  No orders of the defined types were placed in this encounter.   The patient has a good understanding of the overall plan. she agrees with it. she will call with any problems that may develop before the next visit here.   Rulon Eisenmenger, MD 06/27/2015     The knee

## 2015-06-27 NOTE — Patient Instructions (Signed)
[facility] Discharge Instructions for Patients Receiving Chemotherapy  Today you received the following chemotherapy agents:  Herceptin and Zoladex.  To help prevent nausea and vomiting after your treatment, we encourage you to take your nausea medication: As directed.   If you develop nausea and vomiting that is not controlled by your nausea medication, call the clinic.   BELOW ARE SYMPTOMS THAT SHOULD BE REPORTED IMMEDIATELY:  *FEVER GREATER THAN 100.5 F  *CHILLS WITH OR WITHOUT FEVER  NAUSEA AND VOMITING THAT IS NOT CONTROLLED WITH YOUR NAUSEA MEDICATION  *UNUSUAL SHORTNESS OF BREATH  *UNUSUAL BRUISING OR BLEEDING  TENDERNESS IN MOUTH AND THROAT WITH OR WITHOUT PRESENCE OF ULCERS  *URINARY PROBLEMS  *BOWEL PROBLEMS  UNUSUAL RASH Items with * indicate a potential emergency and should be followed up as soon as possible.  Feel free to call the clinic you have any questions or concerns. The clinic phone number is [phone].  Please show the [place] at check-in to the Emergency Department and triage nurse.

## 2015-06-27 NOTE — Progress Notes (Signed)
Pt. Declined benadryl this am as pre-med d/t driving on icy roads after treatment. [place] per Karna Christmas, RN/ Dr. Lindi Adie.

## 2015-06-30 MED FILL — ZOLPIDEM TARTRATE 5 MG TAB: 5 | 20 days supply | Qty: 20 | Fill #0

## 2015-07-01 ENCOUNTER — Encounter: Payer: Managed Care, Other (non HMO) | Admitting: Nurse Practitioner

## 2015-07-01 ENCOUNTER — Ambulatory Visit: Payer: Managed Care, Other (non HMO)

## 2015-07-01 ENCOUNTER — Telehealth: Payer: Self-pay | Admitting: Hematology and Oncology

## 2015-07-01 NOTE — Telephone Encounter (Signed)
At patients last appointment she had asked dr Lindi Adie to move md appt and next tx to 2/6 as she is going out of town,per inbox dr Lindi Adie stated ok and changed tx plan to reflect this,she is aware of her new times

## 2015-07-12 MED FILL — CARVEDILOL 6.25 MG TABLET: 6.25 | 30 days supply | Qty: 60 | Fill #1

## 2015-07-12 MED FILL — VENLAFAXINE HCL ER 75 MG CA: 75 | 30 days supply | Qty: 30 | Fill #3

## 2015-07-13 ENCOUNTER — Other Ambulatory Visit: Payer: Self-pay | Admitting: *Deleted

## 2015-07-13 DIAGNOSIS — C50412 Malignant neoplasm of upper-outer quadrant of left female breast: Secondary | ICD-10-CM

## 2015-07-13 MED ORDER — ZOLPIDEM TARTRATE 5 MG PO TABS: 5.0000 mg | ORAL_TABLET | Freq: Every evening | ORAL | Status: DC | PRN

## 2015-07-14 ENCOUNTER — Encounter (HOSPITAL_COMMUNITY): Payer: Self-pay

## 2015-07-14 ENCOUNTER — Ambulatory Visit (HOSPITAL_COMMUNITY)
Admission: RE | Admit: 2015-07-14 | Discharge: 2015-07-14 | Disposition: A | Discharge status: Home / Self Care | Payer: Managed Care, Other (non HMO) | Source: Ambulatory Visit | Attending: Internal Medicine | Admitting: Internal Medicine

## 2015-07-14 ENCOUNTER — Ambulatory Visit (HOSPITAL_BASED_OUTPATIENT_CLINIC_OR_DEPARTMENT_OTHER)
Admission: RE | Admit: 2015-07-14 | Discharge: 2015-07-14 | Disposition: A | Discharge status: Home / Self Care | Payer: Managed Care, Other (non HMO) | Source: Ambulatory Visit | Attending: Cardiology | Admitting: Cardiology

## 2015-07-14 VITALS — BP 116/72 | HR 77 | Wt 178.8 lb

## 2015-07-14 DIAGNOSIS — I429 Cardiomyopathy, unspecified: Secondary | ICD-10-CM | POA: Diagnosis not present

## 2015-07-14 DIAGNOSIS — Z9221 Personal history of antineoplastic chemotherapy: Secondary | ICD-10-CM | POA: Insufficient documentation

## 2015-07-14 DIAGNOSIS — Z79899 Other long term (current) drug therapy: Secondary | ICD-10-CM | POA: Diagnosis not present

## 2015-07-14 DIAGNOSIS — Z9012 Acquired absence of left breast and nipple: Secondary | ICD-10-CM | POA: Insufficient documentation

## 2015-07-14 DIAGNOSIS — Z170 Estrogen receptor positive status [ER+]: Secondary | ICD-10-CM | POA: Diagnosis not present

## 2015-07-14 DIAGNOSIS — C50919 Malignant neoplasm of unspecified site of unspecified female breast: Secondary | ICD-10-CM

## 2015-07-14 DIAGNOSIS — Z8249 Family history of ischemic heart disease and other diseases of the circulatory system: Secondary | ICD-10-CM | POA: Diagnosis not present

## 2015-07-14 DIAGNOSIS — C50912 Malignant neoplasm of unspecified site of left female breast: Secondary | ICD-10-CM | POA: Diagnosis present

## 2015-07-14 DIAGNOSIS — I428 Other cardiomyopathies: Secondary | ICD-10-CM

## 2015-07-14 DIAGNOSIS — C50412 Malignant neoplasm of upper-outer quadrant of left female breast: Secondary | ICD-10-CM

## 2015-07-14 NOTE — Progress Notes (Signed)
  Echocardiogram 2D Echocardiogram has been performed.  Natasha Torres 07/14/2015, 3:11 PM

## 2015-07-14 NOTE — Patient Instructions (Signed)
Your physician recommends that you schedule a follow-up appointment in: 3 with echocardiogram  

## 2015-07-15 NOTE — Progress Notes (Signed)
P  Advanced Heart Failure Clinic Note   Patient ID: Natasha Torres, female   DOB: [birth year], 35 y.o.   MRN: [id number] Primary oncologist: Dr Lindi Adie HF: Dr Aundra Dubin.  35 yo with left breast cancer presents for evaluation of fall in EF and strain by echo monitoring.  Breast cancer was diagnosed in 2/16.  ER+/PR-/HER2+.  Treated with neoadjuvant chemo (carboplatin, docetaxel, Herceptin, and pertuzumab).  She completed this treatment and is now continuing on Herceptin alone to complete 1 year of treatment. She is s/p mastectomy.  She had an echo in 4/16 showed EF 55-60% with GLS -25%.  However, repeat echo in 6/16 showed fall in EF to 50-55% with GLS fallen to -17%.  Echo at last appointment in 8/16 showed EF down to 45%.  At that time, Herceptin was held for 6 weeks and later restarted.  Today's echo shows EF in the 50% range.    She returns today for Cardio-oncology follow up.  No lightheadedness.  No SOB, CP, orthopnea, bendopnea, PND, or near syncope.  She has no history of cardiac problems.  No family history of cardiomyopathy or early coronary disease.   Labs (7/16): K 4, creatinine 0.7 Labs (8/16): K 4.3, creatinine 0.64 Labs (9/16): K 3.7, creatinine 0.81 Labs (11/16): K 4.1, creatinine 0.7  PMH: 1. Breast cancer: On left, diagnosed 2/16.  ER+/PR-/HER2+.  Treated with neoadjuvant chemo (carboplatin, docetaxel, Herceptin, and pertuzumab).  She completed this treatment and is now continuing on Herceptin alone to complete 1 year of treatment.  S/p mastectomy.  2. 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  - Echo (4/16) with EF 55-60%, GLS -25%. - Echo (6/16) with EF 50-55%, GLS -17% - Echo (8/16) with EF 45%, GLS -18.6%, lateral s' 13.5 cm/sec - Echo (9/16) with EF 50-55%, no strain done, lateral s' 13.1 cm/sec - Echo (11/16) with EF 50%, GLS -15.8% (poor signal), lateral s' 14.3 cm/sec - Echo (1/17) with EF 55%, GLS -17.5%, normal diastolic function, normal RV size and systolic  function.   Social History   Social History  . Marital Status: Married    Spouse Name: Harrell Gave  . Number of Children: 1  . Years of Education: N/A   Social History Main Topics  . Smoking status: Never Smoker   . Smokeless tobacco: Never Used  . Alcohol Use: 0.0 oz/week    0 Standard drinks or equivalent per week     Comment: 1-3 drinks 2-3 x/week  . Drug Use: No  . Sexual Activity: Not Currently   Other Topics Concern  . None   Social History Narrative   ** Merged History Encounter **       Family History  Problem Relation Age of Onset  . Hypertension Mother   . Depression Maternal Aunt   . Cancer Maternal Grandfather   . Cancer Paternal Grandfather   . Prostate cancer Father 33  . Dementia Maternal Grandmother   . Heart attack Maternal Grandfather   . Dementia Paternal Grandmother   . Kidney cancer Paternal Grandmother     slow growing, no treatment  . Prostate cancer Paternal Grandfather 57  . Bone cancer Paternal Grandfather 65  . Breast cancer Paternal Grandfather 69  . Lung cancer Paternal Grandfather     dx late 54s; smoker.  thought to be a 4th primary cancer  . Lung cancer Other     smoker  . Prostate cancer Other     MGMs 1/2 brother   ROS: All systems reviewed and negative  except as per HPI.  Current Outpatient Prescriptions  Medication Sig Dispense Refill  . BIOTIN PO Take 2 tablets by mouth daily.    . candesartan (ATACAND) 4 MG tablet Take 1 tablet (4 mg total) by mouth 2 (two) times daily. 60 tablet 6  . carvedilol (COREG) 6.25 MG tablet TAKE 1 TABLET BY MOUTH TWICE A DAY WITH A MEAL 60 tablet 3  . docusate sodium (COLACE) 100 MG capsule Take 100 mg by mouth daily as needed for mild constipation.    [name] ibuprofen (ADVIL,MOTRIN) 200 MG tablet Take 200-400 mg by mouth every 6 (six) hours as needed for headache, mild pain or moderate pain.    [name] lidocaine-prilocaine (EMLA) cream Apply 1 application topically as needed. 30 g 6  . LORazepam (ATIVAN)  0.5 MG tablet TAKE 1 TABLET BY MOUTH EVERY 8 HOURS AS NEEDED 60 tablet 0  . Multiple Vitamin (MULTIVITAMIN) tablet Take 2 tablets by mouth daily.     . Naltrexone-Bupropion HCl ER 8-90 MG TB12 One tablet (naltrexone 8 mg/bupropion 90 mg) once daily in the morning for 1 week; at week 2, increase to 1 tablet twice daily administered in the morning and evening and continue for 1 week; at week 3, increase to 2 tablets in the morning and 1 tablet in the evening and continue for 1 week; at week 4, increase to 2 tablets twice daily administered in the morning and evening and continue for the remainder of the treatment course. 120 tablet 2  . tamoxifen (NOLVADEX) 20 MG tablet Take 1 tablet (20 mg total) by mouth daily. 90 tablet 3  . Trastuzumab (HERCEPTIN IV) Inject into the vein.    [name] zolpidem (AMBIEN) 5 MG tablet Take 1 tablet (5 mg total) by mouth at bedtime as needed for sleep. 30 tablet 0   No current facility-administered medications for this encounter.   BP 116/72 mmHg  Pulse 77  Wt 178 lb 12.8 oz (81.103 kg)  SpO2 100%   General: NAD Neck: No JVD, no thyromegaly or thyroid nodule.  Lungs: Clear to auscultation bilaterally with normal respiratory effort. CV: Nondisplaced PMI.  Heart regular S1/S2, no S3/S4, no murmur.  No peripheral edema.  No carotid bruit.  Normal pedal pulses.  Abdomen: Soft, nontender, no hepatosplenomegaly, no distention.  Skin: Intact without lesions or rashes.  Neurologic: Alert and oriented x 3.  Psych: Normal affect. Extremities: No clubbing or cyanosis.  HEENT: Normal.   Assessment/plan: 35 yo with history of breast cancer getting Herceptin to complete 1 year of therapy (4/17).   - Echo in 6/16 showed a fall in EF from 55-60% to 50-55%.  There was also a significant fall in global lateral strain, portending the possibility of more profound fall in EF over time.  This may be related to Herceptin.  Echo 02/03/15 showed EF 45%, strain stable compared to 6/16.   Herceptin was held for 6 wks.  Repeat echoes showed EF 50-55% and 50%.  However, today's echo shows EF up to 55% with normal strain pattern (reviewed during the visiti today). - Continue Herceptin, can repeat echo in 3 months with office followup.  - Continue Coreg 6.25 mg bid and candesartan 4 mg bid.   Loralie Champagne  07/15/2015

## 2015-07-18 ENCOUNTER — Ambulatory Visit: Payer: Self-pay

## 2015-07-25 ENCOUNTER — Ambulatory Visit: Payer: Managed Care, Other (non HMO)

## 2015-07-25 ENCOUNTER — Encounter: Payer: Self-pay | Admitting: Hematology and Oncology

## 2015-07-25 ENCOUNTER — Ambulatory Visit (HOSPITAL_BASED_OUTPATIENT_CLINIC_OR_DEPARTMENT_OTHER): Payer: Managed Care, Other (non HMO)

## 2015-07-25 ENCOUNTER — Encounter: Payer: Self-pay | Admitting: *Deleted

## 2015-07-25 ENCOUNTER — Ambulatory Visit (HOSPITAL_BASED_OUTPATIENT_CLINIC_OR_DEPARTMENT_OTHER): Payer: Managed Care, Other (non HMO) | Admitting: Hematology and Oncology

## 2015-07-25 ENCOUNTER — Other Ambulatory Visit: Payer: Managed Care, Other (non HMO)

## 2015-07-25 ENCOUNTER — Other Ambulatory Visit (HOSPITAL_BASED_OUTPATIENT_CLINIC_OR_DEPARTMENT_OTHER): Payer: Managed Care, Other (non HMO)

## 2015-07-25 VITALS — BP 117/71 | HR 79 | Temp 97.8°F | Resp 18

## 2015-07-25 VITALS — BP 118/82 | HR 77 | Temp 98.2°F | Resp 18 | Ht 71.0 in | Wt 178.7 lb

## 2015-07-25 DIAGNOSIS — R634 Abnormal weight loss: Secondary | ICD-10-CM | POA: Diagnosis not present

## 2015-07-25 DIAGNOSIS — Z5111 Encounter for antineoplastic chemotherapy: Secondary | ICD-10-CM | POA: Diagnosis not present

## 2015-07-25 DIAGNOSIS — Z5112 Encounter for antineoplastic immunotherapy: Secondary | ICD-10-CM | POA: Diagnosis not present

## 2015-07-25 DIAGNOSIS — N951 Menopausal and female climacteric states: Secondary | ICD-10-CM

## 2015-07-25 DIAGNOSIS — C50412 Malignant neoplasm of upper-outer quadrant of left female breast: Secondary | ICD-10-CM

## 2015-07-25 DIAGNOSIS — F329 Major depressive disorder, single episode, unspecified: Secondary | ICD-10-CM | POA: Diagnosis not present

## 2015-07-25 DIAGNOSIS — C50919 Malignant neoplasm of unspecified site of unspecified female breast: Secondary | ICD-10-CM

## 2015-07-25 LAB — COMPREHENSIVE METABOLIC PANEL
ALBUMIN: 3.7 g/dL (ref 3.5–5.0)
ALK PHOS: 57 U/L (ref 40–150)
ALT: 19 U/L (ref 0–55)
AST: 29 U/L (ref 5–34)
Anion Gap: 10 mEq/L (ref 3–11)
BUN: 15.1 mg/dL (ref 7.0–26.0)
CHLORIDE: 106 meq/L (ref 98–109)
CO2: 23 meq/L (ref 22–29)
Calcium: 9.3 mg/dL (ref 8.4–10.4)
Creatinine: 0.8 mg/dL (ref 0.6–1.1)
GLUCOSE: 90 mg/dL (ref 70–140)
POTASSIUM: 4.2 meq/L (ref 3.5–5.1)
SODIUM: 139 meq/L (ref 136–145)
Total Bilirubin: 0.4 mg/dL (ref 0.20–1.20)
Total Protein: 6.8 g/dL (ref 6.4–8.3)

## 2015-07-25 LAB — CBC WITH DIFFERENTIAL/PLATELET
BASO%: 1.2 % (ref 0.0–2.0)
BASOS ABS: 0 10*3/uL (ref 0.0–0.1)
EOS ABS: 0.1 10*3/uL (ref 0.0–0.5)
EOS%: 2 % (ref 0.0–7.0)
HCT: 34.5 % — ABNORMAL LOW (ref 34.8–46.6)
HEMOGLOBIN: 11.3 g/dL — AB (ref 11.6–15.9)
LYMPH%: 33.4 % (ref 14.0–49.7)
MCH: 29.7 pg (ref 25.1–34.0)
MCHC: 32.8 g/dL (ref 31.5–36.0)
MCV: 90.4 fL (ref 79.5–101.0)
MONO#: 0.4 10*3/uL (ref 0.1–0.9)
MONO%: 10.5 % (ref 0.0–14.0)
NEUT#: 1.8 10*3/uL (ref 1.5–6.5)
NEUT%: 52.9 % (ref 38.4–76.8)
Platelets: 165 10*3/uL (ref 145–400)
RBC: 3.82 10*6/uL (ref 3.70–5.45)
RDW: 13.6 % (ref 11.2–14.5)
WBC: 3.4 10*3/uL — ABNORMAL LOW (ref 3.9–10.3)
lymph#: 1.1 10*3/uL (ref 0.9–3.3)

## 2015-07-25 MED ORDER — GOSERELIN ACETATE 3.6 MG ~~LOC~~ IMPL
3.6000 mg | DRUG_IMPLANT | Freq: Once | SUBCUTANEOUS | Status: AC
  Administered 2015-07-25: 3.6 mg via SUBCUTANEOUS
  Filled 2015-07-25: qty 3.6

## 2015-07-25 MED ORDER — DIPHENHYDRAMINE HCL 25 MG PO CAPS
ORAL_CAPSULE | ORAL | Status: AC
  Filled 2015-07-25: qty 2

## 2015-07-25 MED ORDER — SODIUM CHLORIDE 0.9 % IJ SOLN
10.0000 mL | Freq: Once | INTRAMUSCULAR | Status: AC
  Administered 2015-07-25: 10 mL
  Filled 2015-07-25: qty 10

## 2015-07-25 MED ORDER — SODIUM CHLORIDE 0.9 % IJ SOLN
10.0000 mL | INTRAMUSCULAR | Status: DC | PRN
  Administered 2015-07-25: 10 mL
  Filled 2015-07-25: qty 10

## 2015-07-25 MED ORDER — TRASTUZUMAB CHEMO INJECTION 440 MG
6.0000 mg/kg | Freq: Once | INTRAVENOUS | Status: AC
  Administered 2015-07-25: 504 mg via INTRAVENOUS
  Filled 2015-07-25: qty 24

## 2015-07-25 MED ORDER — HEPARIN SOD (PORK) LOCK FLUSH 100 UNIT/ML IV SOLN
500.0000 [IU] | Freq: Once | INTRAVENOUS | Status: AC | PRN
  Administered 2015-07-25: 500 [IU]
  Filled 2015-07-25: qty 5

## 2015-07-25 MED ORDER — CARVEDILOL 6.25 MG PO TABS: 6.2500 mg | ORAL_TABLET | Freq: Two times a day (BID) | ORAL | Status: DC

## 2015-07-25 MED ORDER — ACETAMINOPHEN 325 MG PO TABS
ORAL_TABLET | ORAL | Status: AC
  Filled 2015-07-25: qty 2

## 2015-07-25 MED ORDER — ACETAMINOPHEN 325 MG PO TABS
650.0000 mg | ORAL_TABLET | Freq: Once | ORAL | Status: AC
  Administered 2015-07-25: 650 mg via ORAL

## 2015-07-25 MED ORDER — SODIUM CHLORIDE 0.9 % IV SOLN
Freq: Once | INTRAVENOUS | Status: AC
  Administered 2015-07-25: 12:00:00 via INTRAVENOUS

## 2015-07-25 MED FILL — CARVEDILOL 6.25 MG TABLET: 6.25 | 30 days supply | Qty: 60 | Fill #0

## 2015-07-25 NOTE — Progress Notes (Signed)
Patient Care Team: Loraine Leriche, MD as PCP - General (Internal Medicine)  SUMMARY OF ONCOLOGIC HISTORY:   Breast cancer of upper-outer quadrant of left female breast ([place])   08/27/2014 Initial Diagnosis Excisional biopsy: 2 lumps showing 1.5 cm and 0.9 cm IDC, Grade 3, ER Pos, PR Neg and HER-2 amplified Ratio 2.6, Multifocal   09/27/2014 - 01/10/2015 Neo-Adjuvant Chemotherapy Neoadjuvant TCH Perjeta every 3 week 6 followed by Herceptin maintenance   01/13/2015 Breast MRI Postsurgical changes in left breast without residual enhancing masses compatible with treatment response   02/10/2015 Surgery right mastectomy: No malignancy, 0/3 sentinel nodes, complete response to chemotherapy   02/22/2015 -  Anti-estrogen oral therapy Zoladex with tamoxifen    CHIEF COMPLIANT: Follow-up on Herceptin and antiestrogen therapy with tamoxifen and Zoladex  INTERVAL HISTORY: Natasha Torres is a 35 year old with above-mentioned history of left breast cancer currently on Herceptin maintenance as well as Zoladex and tamoxifen. She does not have any major toxicities to tamoxifen other than she is concerned about loss of eyelashes. Denies any hot flashes or myalgias. Her heart is being very closely monitored because of the declined ejection fraction.recent echocardiogram done in January 2017 was normal with an EF of 55%.  REVIEW OF SYSTEMS:   Constitutional: Denies fevers, chills or abnormal weight loss Eyes: Denies blurriness of vision Ears, nose, mouth, throat, and face: Denies mucositis or sore throat Respiratory: Denies cough, dyspnea or wheezes Cardiovascular: Denies palpitation, chest discomfort Gastrointestinal:  Denies nausea, heartburn or change in bowel habits Skin: Denies abnormal skin rashes Lymphatics: Denies new lymphadenopathy or easy bruising Neurological:Denies numbness, tingling or new weaknesses Behavioral/Psych: Mood is stable, no new changes  Extremities: No lower extremity  edema Breast:  denies any pain or lumps or nodules in either breasts All other systems were reviewed with the patient and are negative.  I have reviewed the past medical history, past surgical history, social history and family history with the patient and they are unchanged from previous note.  ALLERGIES:  is allergic to buprenorphine hcl; morphine and related; and penicillins.  MEDICATIONS:  Current Outpatient Prescriptions  Medication Sig Dispense Refill  . BIOTIN PO Take 2 tablets by mouth daily.    . candesartan (ATACAND) 4 MG tablet Take 1 tablet (4 mg total) by mouth 2 (two) times daily. 60 tablet 6  . carvedilol (COREG) 6.25 MG tablet Take 1 tablet (6.25 mg total) by mouth 2 (two) times daily with a meal. 60 tablet 3  . docusate sodium (COLACE) 100 MG capsule Take 100 mg by mouth daily as needed for mild constipation.    [name] ibuprofen (ADVIL,MOTRIN) 200 MG tablet Take 200-400 mg by mouth every 6 (six) hours as needed for headache, mild pain or moderate pain.    [name] lidocaine-prilocaine (EMLA) cream Apply 1 application topically as needed. 30 g 6  . LORazepam (ATIVAN) 0.5 MG tablet TAKE 1 TABLET BY MOUTH EVERY 8 HOURS AS NEEDED 60 tablet 0  . Multiple Vitamin (MULTIVITAMIN) tablet Take 2 tablets by mouth daily.     . Naltrexone-Bupropion HCl ER 8-90 MG TB12 One tablet (naltrexone 8 mg/bupropion 90 mg) once daily in the morning for 1 week; at week 2, increase to 1 tablet twice daily administered in the morning and evening and continue for 1 week; at week 3, increase to 2 tablets in the morning and 1 tablet in the evening and continue for 1 week; at week 4, increase to 2 tablets twice daily administered in the morning  and evening and continue for the remainder of the treatment course. 120 tablet 2  . Naltrexone-Bupropion HCl ER 8-90 MG TB12 Take 8-90 mg by mouth.    . tamoxifen (NOLVADEX) 20 MG tablet Take 1 tablet (20 mg total) by mouth daily. 90 tablet 3  . Trastuzumab (HERCEPTIN IV) Inject  into the vein.    [name] zolpidem (AMBIEN) 5 MG tablet Take 1 tablet (5 mg total) by mouth at bedtime as needed for sleep. 30 tablet 0   No current facility-administered medications for this visit.   Facility-Administered Medications Ordered in Other Visits  Medication Dose Route Frequency Provider Last Rate Last Dose  . sodium chloride 0.9 % injection 10 mL  10 mL Intracatheter PRN Nicholas Lose, MD   10 mL at 07/25/15 1232    PHYSICAL EXAMINATION: ECOG PERFORMANCE STATUS: 1 - Symptomatic but completely ambulatory  Filed Vitals:   07/25/15 1050  BP: 118/82  Pulse: 77  Temp: 98.2 F (36.8 C)  Resp: 18   Filed Weights   07/25/15 1050  Weight: 178 lb 11.2 oz (81.058 kg)    GENERAL:alert, no distress and comfortable SKIN: skin color, texture, turgor are normal, no rashes or significant lesions EYES: normal, Conjunctiva are pink and non-injected, sclera clear OROPHARYNX:no exudate, no erythema and lips, buccal mucosa, and tongue normal  NECK: supple, thyroid normal size, non-tender, without nodularity LYMPH:  no palpable lymphadenopathy in the cervical, axillary or inguinal LUNGS: clear to auscultation and percussion with normal breathing effort HEART: regular rate & rhythm and no murmurs and no lower extremity edema ABDOMEN:abdomen soft, non-tender and normal bowel sounds MUSCULOSKELETAL:no cyanosis of digits and no clubbing  NEURO: alert & oriented x 3 with fluent speech, no focal motor/sensory deficits EXTREMITIES: No lower extremity edema  LABORATORY DATA:  I have reviewed the data as listed   Chemistry      Component Value Date/Time   NA 139 07/25/2015 1011   NA 133* 02/27/2015 1135   K 4.2 07/25/2015 1011   K 3.7 02/27/2015 1135   CL 99* 02/27/2015 1135   CO2 23 07/25/2015 1011   CO2 25 02/27/2015 1135   BUN 15.1 07/25/2015 1011   BUN 16 02/27/2015 1135   CREATININE 0.8 07/25/2015 1011   CREATININE 0.81 02/27/2015 1135      Component Value Date/Time   CALCIUM 9.3  07/25/2015 1011   CALCIUM 8.8* 02/27/2015 1135   ALKPHOS 57 07/25/2015 1011   ALKPHOS 41 02/27/2015 1135   AST 29 07/25/2015 1011   AST 24 02/27/2015 1135   ALT 19 07/25/2015 1011   ALT 13* 02/27/2015 1135   BILITOT 0.40 07/25/2015 1011   BILITOT 0.5 02/27/2015 1135      Lab Results  Component Value Date   WBC 3.4* 07/25/2015   HGB 11.3* 07/25/2015   HCT 34.5* 07/25/2015   MCV 90.4 07/25/2015   PLT 165 07/25/2015   NEUTROABS 1.8 07/25/2015   ASSESSMENT & PLAN:  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 Biopsy of the right breast mass and left axillary lymph node and the secondary mass left breast back as benign. Status post neo-adjuvant TCH Perjeta 6  cycles from 09/27/2014 to 01/10/2015 Followed by bilateral mastectomies on 02/10/2015: No malignancy in either breast ----------------------------------------------------------------------------------------------------------------------------------------------------- Plan: 1. On maintenance Herceptin to complete 1 year of therapy. Previously Herceptin is on hold because of decline in ejection fraction. Echocardiogram on 02/03/2015 showed an ejection fraction of 45%. This has dropped from 55% previously. Dr. Kirk Ruths is monitoring her. Repeat echo 05/16/2015 showed EF 50%. Echocardiogram January 2017: EF 55%  2. On anti-estrogen therapy with tamoxifen with Zoladex for ovarian suppression since 02/22/15.  Tamoxifen and Zoladex toxicities: 1. Hot flashes: Minimal, one episode of severe sweats: improved by Effexor 75 mg daily 2. Depression symptoms improved with  Effexor.   Right breast cellulitis: resolved Neutropenia: Resolved (felt to be due to Bactrim)  Weight issues: On Contrave ( bupropion plus naltrexone) for weight loss Patient is going to be on this for a very short-term. I discussed with her that Wellbutrin could decrease the effect of tamoxifen. She fully understands this.  Loss of eyelashes: Patient was very concerned about it. We previously discussed that we might consider discontinuing Zolodex if they continue to be a problem. It appears that the falling eyelashes has stopped.  Return to clinic in 9 weeks for follow-up on Herceptin, which will be the last treatment of Herceptin.   No orders of the defined types were placed in this encounter.   The patient has a good understanding of the overall plan. she agrees with it. she will call with any problems that may develop before the next visit here.   Rulon Eisenmenger, MD 07/25/2015

## 2015-07-25 NOTE — Assessment & Plan Note (Signed)
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 Biopsy of the right breast mass and left axillary lymph node and the secondary mass left breast back as benign. Status post neo-adjuvant TCH Perjeta 6 cycles from 09/27/2014 to 01/10/2015 Followed by bilateral mastectomies on 02/10/2015: No malignancy in either breast ----------------------------------------------------------------------------------------------------------------------------------------------------- Plan: 1. On maintenance Herceptin to complete 1 year of therapy. Previously Herceptin is on hold because of decline in ejection fraction. Echocardiogram on 02/03/2015 showed an ejection fraction of 45%. This has dropped from 55% previously. Dr. Kirk Ruths is monitoring her. Repeat echo 05/16/2015 showed EF 50%. 2. On anti-estrogen therapy with tamoxifen with Zoladex for ovarian suppression since 02/22/15.  Tamoxifen and Zoladex toxicities: 1. Hot flashes: Minimal, one episode of severe sweats: improved by Effexor 75 mg daily 2. Depression symptoms improved with Effexor. Patient is going to a lot of personal issues. She is getting a divorce. Her financial situation has improved slightly since she started receiving childcare benefit there she is also going back to working full-time and that's helping her financially as well.  Right breast cellulitis: resolved Neutropenia: Resolved (felt to be due to Bactrim)  Weight issues: Patient would like me to prescribe Contrave ( bupropion plus naltrexone) for weight loss Loss of eyelashes: Patient is very concerned about it. Discussed that we might consider discontinuing Zolodex if they continue to  be a problem.  Return to clinic in 6 weeks for follow-up on Herceptin

## 2015-07-25 NOTE — Patient Instructions (Signed)
[facility] Discharge Instructions for Patients Receiving Chemotherapy  Today you received the following chemotherapy agents herceptin   To help prevent nausea and vomiting after your treatment, we encourage you to take your nausea medication as directed   If you develop nausea and vomiting that is not controlled by your nausea medication, call the clinic.   BELOW ARE SYMPTOMS THAT SHOULD BE REPORTED IMMEDIATELY:  *FEVER GREATER THAN 100.5 F  *CHILLS WITH OR WITHOUT FEVER  NAUSEA AND VOMITING THAT IS NOT CONTROLLED WITH YOUR NAUSEA MEDICATION  *UNUSUAL SHORTNESS OF BREATH  *UNUSUAL BRUISING OR BLEEDING  TENDERNESS IN MOUTH AND THROAT WITH OR WITHOUT PRESENCE OF ULCERS  *URINARY PROBLEMS  *BOWEL PROBLEMS  UNUSUAL RASH Items with * indicate a potential emergency and should be followed up as soon as possible.  Feel free to call the clinic you have any questions or concerns. The clinic phone number is [phone].  

## 2015-08-01 MED FILL — ZOLPIDEM TARTRATE 5 MG TAB: 5 | 30 days supply | Qty: 30 | Fill #0

## 2015-08-01 MED FILL — CANDESARTAN CILEXETIL 4 MG: 4 | 30 days supply | Qty: 60 | Fill #2

## 2015-08-08 ENCOUNTER — Ambulatory Visit: Payer: Self-pay

## 2015-08-12 ENCOUNTER — Telehealth (HOSPITAL_COMMUNITY): Payer: Self-pay | Admitting: Vascular Surgery

## 2015-08-12 NOTE — Telephone Encounter (Signed)
Left message to make pt f/u appt w/ ECHO in April

## 2015-08-15 ENCOUNTER — Encounter: Payer: Self-pay | Admitting: *Deleted

## 2015-08-15 ENCOUNTER — Telehealth: Payer: Self-pay | Admitting: Oncology

## 2015-08-15 ENCOUNTER — Ambulatory Visit (HOSPITAL_BASED_OUTPATIENT_CLINIC_OR_DEPARTMENT_OTHER): Payer: Managed Care, Other (non HMO)

## 2015-08-15 VITALS — BP 124/66 | HR 67 | Temp 98.7°F | Resp 20

## 2015-08-15 DIAGNOSIS — Z5112 Encounter for antineoplastic immunotherapy: Secondary | ICD-10-CM

## 2015-08-15 DIAGNOSIS — C50412 Malignant neoplasm of upper-outer quadrant of left female breast: Secondary | ICD-10-CM

## 2015-08-15 MED ORDER — TRASTUZUMAB CHEMO INJECTION 440 MG
6.0000 mg/kg | Freq: Once | INTRAVENOUS | Status: AC
  Administered 2015-08-15: 504 mg via INTRAVENOUS
  Filled 2015-08-15: qty 24

## 2015-08-15 MED ORDER — ACETAMINOPHEN 325 MG PO TABS
650.0000 mg | ORAL_TABLET | Freq: Once | ORAL | Status: AC
  Administered 2015-08-15: 650 mg via ORAL

## 2015-08-15 MED ORDER — SODIUM CHLORIDE 0.9 % IJ SOLN
10.0000 mL | INTRAMUSCULAR | Status: DC | PRN
  Administered 2015-08-15: 10 mL
  Filled 2015-08-15: qty 10

## 2015-08-15 MED ORDER — ACETAMINOPHEN 325 MG PO TABS
ORAL_TABLET | ORAL | Status: AC
  Filled 2015-08-15: qty 2

## 2015-08-15 MED ORDER — SODIUM CHLORIDE 0.9 % IV SOLN
Freq: Once | INTRAVENOUS | Status: AC
  Administered 2015-08-15: 11:00:00 via INTRAVENOUS

## 2015-08-15 MED ORDER — HEPARIN SOD (PORK) LOCK FLUSH 100 UNIT/ML IV SOLN
500.0000 [IU] | Freq: Once | INTRAVENOUS | Status: AC | PRN
  Administered 2015-08-15: 500 [IU]
  Filled 2015-08-15: qty 5

## 2015-08-15 NOTE — Patient Instructions (Signed)
[facility] Discharge Instructions for Patients Receiving Chemotherapy  Today you received the following chemotherapy agents:  Herceptin  To help prevent nausea and vomiting after your treatment, we encourage you to take your nausea medication as ordered per MD.   If you develop nausea and vomiting that is not controlled by your nausea medication, call the clinic.   BELOW ARE SYMPTOMS THAT SHOULD BE REPORTED IMMEDIATELY:  *FEVER GREATER THAN 100.5 F  *CHILLS WITH OR WITHOUT FEVER  NAUSEA AND VOMITING THAT IS NOT CONTROLLED WITH YOUR NAUSEA MEDICATION  *UNUSUAL SHORTNESS OF BREATH  *UNUSUAL BRUISING OR BLEEDING  TENDERNESS IN MOUTH AND THROAT WITH OR WITHOUT PRESENCE OF ULCERS  *URINARY PROBLEMS  *BOWEL PROBLEMS  UNUSUAL RASH Items with * indicate a potential emergency and should be followed up as soon as possible.  Feel free to call the clinic you have any questions or concerns. The clinic phone number is [phone].  Please show the CHEMO ALERT CARD at check-in to the Emergency Department and triage nurse.   

## 2015-08-15 NOTE — Telephone Encounter (Signed)
Spoke with pt to confirm inj appt 3/8 at 8am per 2/27 pof

## 2015-08-17 MED FILL — VENLAFAXINE HCL ER 75 MG CA: 75 | 30 days supply | Qty: 30 | Fill #4

## 2015-08-24 ENCOUNTER — Ambulatory Visit (HOSPITAL_BASED_OUTPATIENT_CLINIC_OR_DEPARTMENT_OTHER): Payer: Managed Care, Other (non HMO)

## 2015-08-24 VITALS — BP 127/65 | HR 72 | Temp 98.3°F

## 2015-08-24 DIAGNOSIS — Z5111 Encounter for antineoplastic chemotherapy: Secondary | ICD-10-CM

## 2015-08-24 DIAGNOSIS — C50412 Malignant neoplasm of upper-outer quadrant of left female breast: Secondary | ICD-10-CM | POA: Diagnosis not present

## 2015-08-24 MED ORDER — GOSERELIN ACETATE 3.6 MG ~~LOC~~ IMPL
3.6000 mg | DRUG_IMPLANT | Freq: Once | SUBCUTANEOUS | Status: AC
  Administered 2015-08-24: 3.6 mg via SUBCUTANEOUS
  Filled 2015-08-24: qty 3.6

## 2015-09-05 ENCOUNTER — Ambulatory Visit (HOSPITAL_BASED_OUTPATIENT_CLINIC_OR_DEPARTMENT_OTHER): Payer: Managed Care, Other (non HMO)

## 2015-09-05 ENCOUNTER — Telehealth: Payer: Self-pay | Admitting: *Deleted

## 2015-09-05 VITALS — BP 122/53 | HR 65 | Temp 98.2°F

## 2015-09-05 DIAGNOSIS — Z5112 Encounter for antineoplastic immunotherapy: Secondary | ICD-10-CM | POA: Diagnosis not present

## 2015-09-05 DIAGNOSIS — C50412 Malignant neoplasm of upper-outer quadrant of left female breast: Secondary | ICD-10-CM | POA: Diagnosis not present

## 2015-09-05 MED ORDER — SODIUM CHLORIDE 0.9 % IV SOLN
6.0000 mg/kg | Freq: Once | INTRAVENOUS | Status: AC
  Administered 2015-09-05: 504 mg via INTRAVENOUS
  Filled 2015-09-05: qty 24

## 2015-09-05 MED ORDER — ACETAMINOPHEN 325 MG PO TABS
650.0000 mg | ORAL_TABLET | Freq: Once | ORAL | Status: AC
  Administered 2015-09-05: 650 mg via ORAL

## 2015-09-05 MED ORDER — HEPARIN SOD (PORK) LOCK FLUSH 100 UNIT/ML IV SOLN
500.0000 [IU] | Freq: Once | INTRAVENOUS | Status: AC | PRN
  Administered 2015-09-05: 500 [IU]
  Filled 2015-09-05: qty 5

## 2015-09-05 MED ORDER — SODIUM CHLORIDE 0.9 % IV SOLN
Freq: Once | INTRAVENOUS | Status: AC
Start: 2015-09-05 — End: 2015-09-05
  Administered 2015-09-05 (×2): via INTRAVENOUS

## 2015-09-05 MED ORDER — SODIUM CHLORIDE 0.9 % IJ SOLN
10.0000 mL | INTRAMUSCULAR | Status: DC | PRN
  Administered 2015-09-05: 10 mL
  Filled 2015-09-05: qty 10

## 2015-09-05 MED FILL — CANDESARTAN CILEXETIL 4 MG: 4 | 30 days supply | Qty: 60 | Fill #3

## 2015-09-05 MED FILL — CARVEDILOL 6.25 MG TABLET: 6.25 | 30 days supply | Qty: 60 | Fill #1

## 2015-09-05 NOTE — Patient Instructions (Signed)
[facility] Discharge Instructions for Patients Receiving Chemotherapy  Today you received the following chemotherapy agents:  Herceptin  To help prevent nausea and vomiting after your treatment, we encourage you to take your nausea medication as ordered per MD.   If you develop nausea and vomiting that is not controlled by your nausea medication, call the clinic.   BELOW ARE SYMPTOMS THAT SHOULD BE REPORTED IMMEDIATELY:  *FEVER GREATER THAN 100.5 F  *CHILLS WITH OR WITHOUT FEVER  NAUSEA AND VOMITING THAT IS NOT CONTROLLED WITH YOUR NAUSEA MEDICATION  *UNUSUAL SHORTNESS OF BREATH  *UNUSUAL BRUISING OR BLEEDING  TENDERNESS IN MOUTH AND THROAT WITH OR WITHOUT PRESENCE OF ULCERS  *URINARY PROBLEMS  *BOWEL PROBLEMS  UNUSUAL RASH Items with * indicate a potential emergency and should be followed up as soon as possible.  Feel free to call the clinic you have any questions or concerns. The clinic phone number is [phone].  Please show the CHEMO ALERT CARD at check-in to the Emergency Department and triage nurse.   

## 2015-09-05 NOTE — Telephone Encounter (Signed)
I have scheduled patient's appt for April 5th and called the patient

## 2015-09-09 ENCOUNTER — Telehealth: Payer: Self-pay

## 2015-09-09 ENCOUNTER — Encounter: Payer: Self-pay | Admitting: *Deleted

## 2015-09-09 NOTE — Progress Notes (Signed)
Pt called to r/s final herceptin d/t her surgery was moved to 4/7 with port removal. Scheduled and confirmed new appt for lab, flush, and gudena on 4/5. POF placed to r/s herceptin from 4/10 to 4/5.

## 2015-09-09 NOTE — Telephone Encounter (Signed)
LMOVM - identified voice mail.  Returning pt call - she can move herceptin up to 7th or she can leave it on the 10th and have it through a peripheral iv.  Pt to decide and call scheduling if she wants to change it.

## 2015-09-12 ENCOUNTER — Other Ambulatory Visit: Payer: Self-pay | Admitting: Hematology and Oncology

## 2015-09-12 MED FILL — VENLAFAXINE HCL ER 75 MG CA: 75 | 30 days supply | Qty: 30 | Fill #5

## 2015-09-13 MED FILL — ZOLPIDEM TARTRATE 5 MG TAB: 5 | 30 days supply | Qty: 30 | Fill #0

## 2015-09-13 NOTE — Telephone Encounter (Signed)
Chart reviewed.

## 2015-09-14 NOTE — H&P (Signed)
  Subjective:    Patient ID: Natasha Torres is a 35 y.o. female.  HPI 3.5 months post op implant exchange. Plan additional lipofilling to improve contour reconstructed breasts and removal port.  Presented with palpable mass on left. MMG and US revealed 2 masses in breast. 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 Genetics with ATM mutation. Biopsy of the right breast mass and left axillary lymph node and the secondary mass left breast was benign. Completed neoadjuvant chemotherapy. Final pathology with no residual cancer, LN negative. Repeat ECHO with improved EF and Herceptin resumed- last infusion complete first week April.   Review of Systems     Objective:   Physical Exam  Cardiovascular: Normal rate, regular rhythm and normal heart sounds.  Pulmonary/Chest: Effort normal and breath sounds normal.  Abdominal: Soft.   Chest soft bilateral, Superior pole with asymmetry, more depressed on right, bilateral animation deformity SN to nipple R 23 L 23 cm  Port right chest  Assessment:     Left breast cancer, ATM mutation  Neoadjuvant chemotherapy S/p bilateral NSM, TE/ADM reconstruction S/p removal/replacement bilateral TE, removal ADM for cellulitis Recurrent seroma right, MSSA S/p placement silicone implants, lipofilling     Plan:     Plan additional fat grafting from thigh donor site to address contour depressions and try to mask some of animation deformity. Plan removal port at that time. Reviewed OP procedure 2-4 incisions thighs for fat harvest, compression thighs for 6 weeks, time off work, variable take graft, risk contour deformity thighs.   Mentor Siltex Ultra High Profile 430 ml implants placed bilateral, 110 ml fat infiltrated over right reconstruction, 80 ml over left.  Irene Limbo, MD [facility] [phone]

## 2015-09-15 ENCOUNTER — Encounter (HOSPITAL_BASED_OUTPATIENT_CLINIC_OR_DEPARTMENT_OTHER): Payer: Self-pay | Admitting: *Deleted

## 2015-09-19 ENCOUNTER — Ambulatory Visit: Payer: Managed Care, Other (non HMO)

## 2015-09-21 ENCOUNTER — Ambulatory Visit: Payer: Managed Care, Other (non HMO)

## 2015-09-21 ENCOUNTER — Other Ambulatory Visit: Payer: Self-pay | Admitting: *Deleted

## 2015-09-21 ENCOUNTER — Ambulatory Visit (HOSPITAL_BASED_OUTPATIENT_CLINIC_OR_DEPARTMENT_OTHER): Payer: Managed Care, Other (non HMO)

## 2015-09-21 ENCOUNTER — Encounter: Payer: Self-pay | Admitting: Hematology and Oncology

## 2015-09-21 ENCOUNTER — Telehealth: Payer: Self-pay | Admitting: Hematology and Oncology

## 2015-09-21 ENCOUNTER — Other Ambulatory Visit (HOSPITAL_BASED_OUTPATIENT_CLINIC_OR_DEPARTMENT_OTHER): Payer: Managed Care, Other (non HMO)

## 2015-09-21 ENCOUNTER — Ambulatory Visit (HOSPITAL_BASED_OUTPATIENT_CLINIC_OR_DEPARTMENT_OTHER): Payer: Managed Care, Other (non HMO) | Admitting: Hematology and Oncology

## 2015-09-21 VITALS — BP 118/66 | HR 72 | Temp 98.1°F | Resp 18 | Ht 71.0 in | Wt 181.4 lb

## 2015-09-21 DIAGNOSIS — Z5111 Encounter for antineoplastic chemotherapy: Secondary | ICD-10-CM

## 2015-09-21 DIAGNOSIS — N951 Menopausal and female climacteric states: Secondary | ICD-10-CM | POA: Diagnosis not present

## 2015-09-21 DIAGNOSIS — C50412 Malignant neoplasm of upper-outer quadrant of left female breast: Secondary | ICD-10-CM

## 2015-09-21 DIAGNOSIS — Z5112 Encounter for antineoplastic immunotherapy: Secondary | ICD-10-CM

## 2015-09-21 DIAGNOSIS — Z853 Personal history of malignant neoplasm of breast: Secondary | ICD-10-CM

## 2015-09-21 DIAGNOSIS — C50919 Malignant neoplasm of unspecified site of unspecified female breast: Secondary | ICD-10-CM

## 2015-09-21 DIAGNOSIS — Z95828 Presence of other vascular implants and grafts: Secondary | ICD-10-CM

## 2015-09-21 DIAGNOSIS — F329 Major depressive disorder, single episode, unspecified: Secondary | ICD-10-CM | POA: Diagnosis not present

## 2015-09-21 DIAGNOSIS — N6310 Unspecified lump in the right breast, unspecified quadrant: Secondary | ICD-10-CM

## 2015-09-21 LAB — CBC WITH DIFFERENTIAL/PLATELET
BASO%: 0.2 % (ref 0.0–2.0)
Basophils Absolute: 0 10*3/uL (ref 0.0–0.1)
EOS ABS: 0.1 10*3/uL (ref 0.0–0.5)
EOS%: 1.4 % (ref 0.0–7.0)
HCT: 34.6 % — ABNORMAL LOW (ref 34.8–46.6)
HEMOGLOBIN: 11.4 g/dL — AB (ref 11.6–15.9)
LYMPH#: 1.5 10*3/uL (ref 0.9–3.3)
LYMPH%: 31.6 % (ref 14.0–49.7)
MCH: 29.9 pg (ref 25.1–34.0)
MCHC: 32.9 g/dL (ref 31.5–36.0)
MCV: 90.8 fL (ref 79.5–101.0)
MONO#: 0.5 10*3/uL (ref 0.1–0.9)
MONO%: 9.9 % (ref 0.0–14.0)
NEUT%: 56.9 % (ref 38.4–76.8)
NEUTROS ABS: 2.8 10*3/uL (ref 1.5–6.5)
PLATELETS: 180 10*3/uL (ref 145–400)
RBC: 3.81 10*6/uL (ref 3.70–5.45)
RDW: 12.6 % (ref 11.2–14.5)
WBC: 4.9 10*3/uL (ref 3.9–10.3)

## 2015-09-21 LAB — COMPREHENSIVE METABOLIC PANEL
ALBUMIN: 3.8 g/dL (ref 3.5–5.0)
ALK PHOS: 57 U/L (ref 40–150)
ALT: 15 U/L (ref 0–55)
ANION GAP: 6 meq/L (ref 3–11)
AST: 25 U/L (ref 5–34)
BUN: 19.6 mg/dL (ref 7.0–26.0)
CO2: 27 meq/L (ref 22–29)
CREATININE: 0.8 mg/dL (ref 0.6–1.1)
Calcium: 9.3 mg/dL (ref 8.4–10.4)
Chloride: 106 mEq/L (ref 98–109)
GLUCOSE: 94 mg/dL (ref 70–140)
Potassium: 4 mEq/L (ref 3.5–5.1)
SODIUM: 140 meq/L (ref 136–145)
TOTAL PROTEIN: 7.2 g/dL (ref 6.4–8.3)

## 2015-09-21 MED ORDER — HEPARIN SOD (PORK) LOCK FLUSH 100 UNIT/ML IV SOLN
500.0000 [IU] | Freq: Once | INTRAVENOUS | Status: AC | PRN
  Administered 2015-09-21: 500 [IU]
  Filled 2015-09-21: qty 5

## 2015-09-21 MED ORDER — SODIUM CHLORIDE 0.9 % IV SOLN
6.0000 mg/kg | Freq: Once | INTRAVENOUS | Status: AC
  Administered 2015-09-21: 504 mg via INTRAVENOUS
  Filled 2015-09-21: qty 24

## 2015-09-21 MED ORDER — ACETAMINOPHEN 325 MG PO TABS
ORAL_TABLET | ORAL | Status: AC
  Filled 2015-09-21: qty 2

## 2015-09-21 MED ORDER — ACETAMINOPHEN 325 MG PO TABS
650.0000 mg | ORAL_TABLET | Freq: Once | ORAL | Status: AC
  Administered 2015-09-21: 650 mg via ORAL

## 2015-09-21 MED ORDER — SODIUM CHLORIDE 0.9 % IV SOLN
Freq: Once | INTRAVENOUS | Status: AC
  Administered 2015-09-21: 16:00:00 via INTRAVENOUS

## 2015-09-21 MED ORDER — SODIUM CHLORIDE 0.9 % IJ SOLN
10.0000 mL | INTRAMUSCULAR | Status: DC | PRN
  Administered 2015-09-21: 10 mL
  Filled 2015-09-21: qty 10

## 2015-09-21 MED ORDER — GOSERELIN ACETATE 3.6 MG ~~LOC~~ IMPL
3.6000 mg | DRUG_IMPLANT | Freq: Once | SUBCUTANEOUS | Status: AC
  Administered 2015-09-21: 3.6 mg via SUBCUTANEOUS
  Filled 2015-09-21: qty 3.6

## 2015-09-21 MED ORDER — SODIUM CHLORIDE 0.9% FLUSH
10.0000 mL | INTRAVENOUS | Status: DC | PRN
  Administered 2015-09-21: 10 mL via INTRAVENOUS
  Filled 2015-09-21: qty 10

## 2015-09-21 NOTE — Patient Instructions (Signed)
[facility] Discharge Instructions for Patients  Today you received the following: Herceptin   To help prevent nausea and vomiting after your treatment, we encourage you to take your nausea medication as directed.   If you develop nausea and vomiting that is not controlled by your nausea medication, call the clinic.   BELOW ARE SYMPTOMS THAT SHOULD BE REPORTED IMMEDIATELY:  *FEVER GREATER THAN 100.5 F  *CHILLS WITH OR WITHOUT FEVER  NAUSEA AND VOMITING THAT IS NOT CONTROLLED WITH YOUR NAUSEA MEDICATION  *UNUSUAL SHORTNESS OF BREATH  *UNUSUAL BRUISING OR BLEEDING  TENDERNESS IN MOUTH AND THROAT WITH OR WITHOUT PRESENCE OF ULCERS  *URINARY PROBLEMS  *BOWEL PROBLEMS  UNUSUAL RASH Items with * indicate a potential emergency and should be followed up as soon as possible.  Feel free to call the clinic you have any questions or concerns. The clinic phone number is [phone].  Please show the CHEMO ALERT CARD at check-in to the Emergency Department and triage nurse.   

## 2015-09-21 NOTE — Assessment & Plan Note (Signed)
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 Biopsy of the right breast mass and left axillary lymph node and the secondary mass left breast back as benign. Status post neo-adjuvant TCH Perjeta 6 cycles from 09/27/2014 to 01/10/2015 Followed by bilateral mastectomies on 02/10/2015: No malignancy in either breast ----------------------------------------------------------------------------------------------------------------------------------------------------- Plan: 1. On maintenance Herceptin to complete 1 year of therapy. Previously Herceptin is on hold because of decline in ejection fraction. Echocardiogram on 02/03/2015 showed an ejection fraction of 45%. This has dropped from 55% previously. Dr. Kirk Ruths is monitored her.Echocardiogram January 2017: EF 55%  2. On anti-estrogen therapy with tamoxifen with Zoladex for ovarian suppression since 02/22/15.  Tamoxifen and Zoladex toxicities: 1. Hot flashes: Minimal, one episode of severe sweats: improved by Effexor 75 mg daily 2. Depression symptoms improved with Effexor.   Right breast cellulitis: resolved Neutropenia: Resolved (felt to be due to Bactrim) Weight issues: On Contrave ( bupropion plus naltrexone) for weight loss Patient is going to be on this for a very short-term. I discussed with her that Wellbutrin could decrease the effect of tamoxifen. She fully understands this.  Loss of eyelashes: Patient was very concerned about it. We previously discussed that we might consider discontinuing Zolodex if they continue to be a problem. It appears that the falling eyelashes has stopped.  Operable lump: I recommended obtaining an  ultrasound and a biopsy. I suspect that this is a fat necrosis. Patient has surgery scheduled for this Friday to remove the port and for fat grafting.

## 2015-09-21 NOTE — Progress Notes (Signed)
Confirmed pt [place] of right breast for 09/22/15 at 9:10 and bx on 09/26/15 at 2:40. Discussed survivorship program and referral placement. Placed POF for survivorship program and zoladex injections. Denies further needs at this time. Encourage pt to call with questions or needs

## 2015-09-21 NOTE — Progress Notes (Signed)
Patient Care Team: Loraine Leriche, MD as PCP - General (Internal Medicine)  SUMMARY OF ONCOLOGIC HISTORY:   Breast cancer of upper-outer quadrant of left female breast ([name])   08/27/2014 Initial Diagnosis Excisional biopsy: 2 lumps showing 1.5 cm and 0.9 cm IDC, Grade 3, ER Pos, PR Neg and HER-2 amplified Ratio 2.6, Multifocal   09/27/2014 - 01/10/2015 Neo-Adjuvant Chemotherapy Neoadjuvant TCH Perjeta every 3 week 6 followed by Herceptin maintenance completed 09/21/2015   01/13/2015 Breast MRI Postsurgical changes in left breast without residual enhancing masses compatible with treatment response   02/10/2015 Surgery right mastectomy: No malignancy, 0/3 sentinel nodes, complete response to chemotherapy   02/22/2015 -  Anti-estrogen oral therapy Zoladex with tamoxifen    CHIEF COMPLIANT: Palpable nodularity in the right breast upper outer quadrant  INTERVAL HISTORY: Natasha Torres is a 35 year old with above-mentioned history of left breast cancer with pathologic complete response. She underwent bilateral mastectomies and is now on antiestrogen therapy with Zoladex and tamoxifen. She underwent breast reconstruction. Recently she felt a palpable lump or an nodules in the right breast in the upper outer quadrant. She is here today to discuss what to do about these lesions. She denies any major problems or concerns related to antiestrogen therapy. She does have marked improvement in hot flashes and mood swings when she is on Effexor.  REVIEW OF SYSTEMS:   Constitutional: Denies fevers, chills or abnormal weight loss Eyes: Denies blurriness of vision Ears, nose, mouth, throat, and face: Denies mucositis or sore throat Respiratory: Denies cough, dyspnea or wheezes Cardiovascular: Denies palpitation, chest discomfort Gastrointestinal:  Denies nausea, heartburn or change in bowel habits Skin: Denies abnormal skin rashes Lymphatics: Denies new lymphadenopathy or easy  bruising Neurological:Denies numbness, tingling or new weaknesses Behavioral/Psych: Mood is stable, no new changes  Extremities: No lower extremity edema Breast: Nodules right breast upper outer quadrant All other systems were reviewed with the patient and are negative.  I have reviewed the past medical history, past surgical history, social history and family history with the patient and they are unchanged from previous note.  ALLERGIES:  is allergic to buprenorphine hcl; morphine and related; other; penicillins; and tegaderm ag mesh.  MEDICATIONS:  Current Outpatient Prescriptions  Medication Sig Dispense Refill  . BIOTIN PO Take 1 tablet by mouth 2 (two) times daily.     . candesartan (ATACAND) 4 MG tablet Take 1 tablet (4 mg total) by mouth 2 (two) times daily. 60 tablet 6  . carvedilol (COREG) 6.25 MG tablet Take 1 tablet (6.25 mg total) by mouth 2 (two) times daily with a meal. 60 tablet 3  . Multiple Vitamin (MULTIVITAMIN) tablet Take 2 tablets by mouth daily.     . tamoxifen (NOLVADEX) 20 MG tablet Take 1 tablet (20 mg total) by mouth daily. 90 tablet 3  . venlafaxine (EFFEXOR) 75 MG tablet Take 75 mg by mouth daily.    [name] zolpidem (AMBIEN) 5 MG tablet TAKE 1 TABLET BY MOUTH EACH NIGHT AT BEDTIME AS NEEDED FOR SLEEP 30 tablet 0   No current facility-administered medications for this visit.   Facility-Administered Medications Ordered in Other Visits  Medication Dose Route Frequency Provider Last Rate Last Dose  . heparin lock flush 100 unit/mL  500 Units Intracatheter Once PRN Nicholas Lose, MD      . sodium chloride 0.9 % injection 10 mL  10 mL Intracatheter PRN Nicholas Lose, MD        PHYSICAL EXAMINATION: ECOG PERFORMANCE STATUS: 1 -  Symptomatic but completely ambulatory  Filed Vitals:   09/21/15 1442  BP: 118/66  Pulse: 72  Temp: 98.1 F (36.7 C)  Resp: 18   Filed Weights   09/21/15 1442  Weight: 181 lb 6.4 oz (82.283 kg)    GENERAL:alert, no distress and  comfortable SKIN: skin color, texture, turgor are normal, no rashes or significant lesions EYES: normal, Conjunctiva are pink and non-injected, sclera clear OROPHARYNX:no exudate, no erythema and lips, buccal mucosa, and tongue normal  NECK: supple, thyroid normal size, non-tender, without nodularity LYMPH:  no palpable lymphadenopathy in the cervical, axillary or inguinal LUNGS: clear to auscultation and percussion with normal breathing effort HEART: regular rate & rhythm and no murmurs and no lower extremity edema ABDOMEN:abdomen soft, non-tender and normal bowel sounds MUSCULOSKELETAL:no cyanosis of digits and no clubbing  NEURO: alert & oriented x 3 with fluent speech, no focal motor/sensory deficits EXTREMITIES: No lower extremity edema BREAST:Palpable nodules upper outer quadrant right breast subcutaneously. No palpable axillary supraclavicular or infraclavicular adenopathy no breast tenderness or nipple discharge. (exam performed in the presence of a chaperone)  LABORATORY DATA:  I have reviewed the data as listed   Chemistry      Component Value Date/Time   NA 140 09/21/2015 1340   NA 133* 02/27/2015 1135   K 4.0 09/21/2015 1340   K 3.7 02/27/2015 1135   CL 99* 02/27/2015 1135   CO2 27 09/21/2015 1340   CO2 25 02/27/2015 1135   BUN 19.6 09/21/2015 1340   BUN 16 02/27/2015 1135   CREATININE 0.8 09/21/2015 1340   CREATININE 0.81 02/27/2015 1135      Component Value Date/Time   CALCIUM 9.3 09/21/2015 1340   CALCIUM 8.8* 02/27/2015 1135   ALKPHOS 57 09/21/2015 1340   ALKPHOS 41 02/27/2015 1135   AST 25 09/21/2015 1340   AST 24 02/27/2015 1135   ALT 15 09/21/2015 1340   ALT 13* 02/27/2015 1135   BILITOT <0.30 09/21/2015 1340   BILITOT 0.5 02/27/2015 1135     Lab Results  Component Value Date   WBC 4.9 09/21/2015   HGB 11.4* 09/21/2015   HCT 34.6* 09/21/2015   MCV 90.8 09/21/2015   PLT 180 09/21/2015   NEUTROABS 2.8 09/21/2015   ASSESSMENT & PLAN:  Breast  cancer of upper-outer quadrant of left female breast 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 Biopsy of the right breast mass and left axillary lymph node and the secondary mass left breast back as benign. Status post neo-adjuvant TCH Perjeta 6 cycles from 09/27/2014 to 01/10/2015 Followed by bilateral mastectomies on 02/10/2015: No malignancy in either breast ----------------------------------------------------------------------------------------------------------------------------------------------------- Plan: 1. On maintenance Herceptin to complete 1 year of therapy. Previously Herceptin is on hold because of decline in ejection fraction. Echocardiogram on 02/03/2015 showed an ejection fraction of 45%. This has dropped from 55% previously. Dr. Kirk Ruths is monitored her.Echocardiogram January 2017: EF 55%  2. On anti-estrogen therapy with tamoxifen with Zoladex for ovarian suppression since 02/22/15.  Tamoxifen and Zoladex toxicities: 1. Hot flashes: Minimal, one episode of severe sweats: improved by Effexor 75 mg daily 2. Depression symptoms improved with Effexor.   Right breast cellulitis: resolved Neutropenia: Resolved (felt to be due to  Bactrim) Weight issues: Tried Contrave ( bupropion plus naltrexone) for weight loss. Patient did not get any benefit  Loss of eyelashes: Patient was very concerned about it. We previously discussed that we might consider discontinuing Zolodex if they continue to be a problem. It appears that the falling eyelashes has stopped.  Palpable lumps: I recommended obtaining an ultrasound and a biopsy. I suspect that this is a fat necrosis. Patient has  surgery scheduled for this Friday to remove the port and for fat grafting.  Return to clinic in 6 months for follow-up No orders of the defined types were placed in this encounter.   The patient has a good understanding of the overall plan. she agrees with it. she will call with any problems that may develop before the next visit here.   Rulon Eisenmenger, MD 09/21/2015

## 2015-09-21 NOTE — Telephone Encounter (Signed)
appt made and avs printed °

## 2015-09-21 NOTE — Progress Notes (Signed)
Zoladex injection given by infusion nurse after chemo treatment.

## 2015-09-21 NOTE — Patient Instructions (Signed)

## 2015-09-21 NOTE — Progress Notes (Signed)
Unable to get in to exam room prior to MD.  No assessment performed.  

## 2015-09-22 ENCOUNTER — Ambulatory Visit
Admission: RE | Admit: 2015-09-22 | Discharge: 2015-09-22 | Disposition: A | Discharge status: Home / Self Care | Payer: Managed Care, Other (non HMO) | Source: Ambulatory Visit | Attending: Hematology and Oncology | Admitting: Hematology and Oncology

## 2015-09-22 DIAGNOSIS — Z853 Personal history of malignant neoplasm of breast: Secondary | ICD-10-CM

## 2015-09-23 ENCOUNTER — Ambulatory Visit (HOSPITAL_BASED_OUTPATIENT_CLINIC_OR_DEPARTMENT_OTHER): Payer: Managed Care, Other (non HMO) | Admitting: Anesthesiology

## 2015-09-23 ENCOUNTER — Ambulatory Visit (HOSPITAL_BASED_OUTPATIENT_CLINIC_OR_DEPARTMENT_OTHER)
Admission: RE | Admit: 2015-09-23 | Discharge: 2015-09-23 | Disposition: A | Discharge status: Home / Self Care | Payer: Managed Care, Other (non HMO) | Source: Ambulatory Visit | Attending: Plastic Surgery | Admitting: Plastic Surgery

## 2015-09-23 ENCOUNTER — Encounter (HOSPITAL_BASED_OUTPATIENT_CLINIC_OR_DEPARTMENT_OTHER)
Admission: RE | Disposition: A | Discharge status: Home / Self Care | Payer: Self-pay | Source: Ambulatory Visit | Attending: Plastic Surgery

## 2015-09-23 ENCOUNTER — Other Ambulatory Visit: Payer: Managed Care, Other (non HMO)

## 2015-09-23 ENCOUNTER — Telehealth: Payer: Self-pay | Admitting: Hematology and Oncology

## 2015-09-23 ENCOUNTER — Encounter (HOSPITAL_BASED_OUTPATIENT_CLINIC_OR_DEPARTMENT_OTHER): Payer: Self-pay | Admitting: Anesthesiology

## 2015-09-23 DIAGNOSIS — Z452 Encounter for adjustment and management of vascular access device: Secondary | ICD-10-CM | POA: Diagnosis present

## 2015-09-23 DIAGNOSIS — Z9221 Personal history of antineoplastic chemotherapy: Secondary | ICD-10-CM | POA: Diagnosis not present

## 2015-09-23 DIAGNOSIS — Z853 Personal history of malignant neoplasm of breast: Secondary | ICD-10-CM | POA: Insufficient documentation

## 2015-09-23 HISTORY — PX: PORT-A-CATH REMOVAL: SHX5289

## 2015-09-23 HISTORY — DX: Personal history of malignant neoplasm of breast: Z85.3

## 2015-09-23 HISTORY — PX: LIPOSUCTION WITH LIPOFILLING: SHX6436

## 2015-09-23 SURGERY — LIPOSUCTION, WITH FAT TRANSFER
Anesthesia: General | Laterality: Right

## 2015-09-23 MED ORDER — CHLORHEXIDINE GLUCONATE 4 % EX LIQD: 1.0000 "application " | Freq: Once | CUTANEOUS | Status: DC

## 2015-09-23 MED ORDER — FENTANYL CITRATE (PF) 100 MCG/2ML IJ SOLN
50.0000 ug | INTRAMUSCULAR | Status: AC | PRN
  Administered 2015-09-23: 100 ug via INTRAVENOUS
  Administered 2015-09-23 (×2): 25 ug via INTRAVENOUS
  Administered 2015-09-23: 50 ug via INTRAVENOUS

## 2015-09-23 MED ORDER — PROPOFOL 500 MG/50ML IV EMUL
INTRAVENOUS | Status: AC
  Filled 2015-09-23: qty 50

## 2015-09-23 MED ORDER — ONDANSETRON HCL 4 MG/2ML IJ SOLN: 4.0000 mg | Freq: Once | INTRAMUSCULAR | Status: DC | PRN

## 2015-09-23 MED ORDER — VANCOMYCIN HCL IN DEXTROSE 1-5 GM/200ML-% IV SOLN
1000.0000 mg | INTRAVENOUS | Status: AC
  Administered 2015-09-23: 1000 mg via INTRAVENOUS

## 2015-09-23 MED ORDER — HYDROMORPHONE HCL 1 MG/ML IJ SOLN
0.2500 mg | INTRAMUSCULAR | Status: DC | PRN
  Administered 2015-09-23 (×4): 0.5 mg via INTRAVENOUS

## 2015-09-23 MED ORDER — MEPERIDINE HCL 25 MG/ML IJ SOLN: 6.2500 mg | INTRAMUSCULAR | Status: DC | PRN

## 2015-09-23 MED ORDER — FENTANYL CITRATE (PF) 100 MCG/2ML IJ SOLN
INTRAMUSCULAR | Status: AC
  Filled 2015-09-23: qty 2

## 2015-09-23 MED ORDER — ONDANSETRON HCL 4 MG/2ML IJ SOLN
INTRAMUSCULAR | Status: DC | PRN
  Administered 2015-09-23: 4 mg via INTRAVENOUS

## 2015-09-23 MED ORDER — ONDANSETRON HCL 4 MG/2ML IJ SOLN
INTRAMUSCULAR | Status: AC
  Filled 2015-09-23: qty 2

## 2015-09-23 MED ORDER — HYDROMORPHONE HCL 1 MG/ML IJ SOLN
INTRAMUSCULAR | Status: AC
  Filled 2015-09-23: qty 1

## 2015-09-23 MED ORDER — LACTATED RINGERS IV SOLN
INTRAVENOUS | Status: DC
  Administered 2015-09-23 (×2): via INTRAVENOUS

## 2015-09-23 MED ORDER — SODIUM BICARBONATE 4 % IV SOLN
INTRAVENOUS | Status: DC | PRN
  Administered 2015-09-23: 225 mL via INTRAMUSCULAR

## 2015-09-23 MED ORDER — GLYCOPYRROLATE 0.2 MG/ML IJ SOLN: 0.2000 mg | Freq: Once | INTRAMUSCULAR | Status: DC | PRN

## 2015-09-23 MED ORDER — MIDAZOLAM HCL 2 MG/2ML IJ SOLN
1.0000 mg | INTRAMUSCULAR | Status: DC | PRN
  Administered 2015-09-23: 2 mg via INTRAVENOUS

## 2015-09-23 MED ORDER — VANCOMYCIN HCL IN DEXTROSE 1-5 GM/200ML-% IV SOLN
INTRAVENOUS | Status: AC
  Filled 2015-09-23: qty 200

## 2015-09-23 MED ORDER — DEXAMETHASONE SODIUM PHOSPHATE 4 MG/ML IJ SOLN
INTRAMUSCULAR | Status: DC | PRN
  Administered 2015-09-23: 10 mg via INTRAVENOUS

## 2015-09-23 MED ORDER — MIDAZOLAM HCL 2 MG/2ML IJ SOLN
INTRAMUSCULAR | Status: AC
  Filled 2015-09-23: qty 2

## 2015-09-23 MED ORDER — OXYCODONE HCL 5 MG PO TABS: 5.0000 mg | ORAL_TABLET | ORAL | Status: DC | PRN

## 2015-09-23 MED ORDER — EPHEDRINE SULFATE 50 MG/ML IJ SOLN
INTRAMUSCULAR | Status: DC | PRN
  Administered 2015-09-23 (×3): 10 mg via INTRAVENOUS

## 2015-09-23 MED ORDER — PROPOFOL 10 MG/ML IV BOLUS
INTRAVENOUS | Status: DC | PRN
  Administered 2015-09-23: 160 mg via INTRAVENOUS

## 2015-09-23 MED ORDER — LIDOCAINE-EPINEPHRINE 1 %-1:100000 IJ SOLN
INTRAMUSCULAR | Status: AC
  Filled 2015-09-23: qty 1

## 2015-09-23 MED ORDER — SCOPOLAMINE 1 MG/3DAYS TD PT72: 1.0000 | MEDICATED_PATCH | Freq: Once | TRANSDERMAL | Status: DC | PRN

## 2015-09-23 MED ORDER — LIDOCAINE HCL (CARDIAC) 20 MG/ML IV SOLN
INTRAVENOUS | Status: DC | PRN
  Administered 2015-09-23: 60 mg via INTRAVENOUS

## 2015-09-23 MED ORDER — DEXAMETHASONE SODIUM PHOSPHATE 10 MG/ML IJ SOLN
INTRAMUSCULAR | Status: AC
  Filled 2015-09-23: qty 1

## 2015-09-23 MED ORDER — LIDOCAINE HCL (CARDIAC) 20 MG/ML IV SOLN
INTRAVENOUS | Status: AC
  Filled 2015-09-23: qty 5

## 2015-09-23 MED ORDER — BUPIVACAINE-EPINEPHRINE (PF) 0.25% -1:200000 IJ SOLN
INTRAMUSCULAR | Status: AC
  Filled 2015-09-23: qty 30

## 2015-09-23 MED ORDER — PHENYLEPHRINE HCL 10 MG/ML IJ SOLN
INTRAMUSCULAR | Status: DC | PRN
  Administered 2015-09-23: 80 ug via INTRAVENOUS

## 2015-09-23 MED ORDER — LIDOCAINE HCL (PF) 1 % IJ SOLN
INTRAMUSCULAR | Status: AC
  Filled 2015-09-23: qty 30

## 2015-09-23 MED ORDER — EPINEPHRINE HCL 1 MG/ML IJ SOLN
INTRAMUSCULAR | Status: AC
  Filled 2015-09-23: qty 1

## 2015-09-23 MED ORDER — SODIUM BICARBONATE 4 % IV SOLN
INTRAVENOUS | Status: AC
  Filled 2015-09-23: qty 5

## 2015-09-23 MED ORDER — EPHEDRINE SULFATE 50 MG/ML IJ SOLN
INTRAMUSCULAR | Status: AC
  Filled 2015-09-23: qty 1

## 2015-09-23 SURGICAL SUPPLY — 73 items
APL SKNCLS STERI-STRIP NONHPOA (GAUZE/BANDAGES/DRESSINGS)
BENZOIN TINCTURE PRP APPL 2/3 (GAUZE/BANDAGES/DRESSINGS) IMPLANT
BINDER ABDOMINAL 10 UNV 27-48 (MISCELLANEOUS) IMPLANT
BINDER ABDOMINAL 12 SM 30-45 (SOFTGOODS) IMPLANT
BINDER BREAST LRG (GAUZE/BANDAGES/DRESSINGS) ×1 IMPLANT
BINDER BREAST MEDIUM (GAUZE/BANDAGES/DRESSINGS) IMPLANT
BINDER BREAST XLRG (GAUZE/BANDAGES/DRESSINGS) IMPLANT
BINDER BREAST XXLRG (GAUZE/BANDAGES/DRESSINGS) IMPLANT
BLADE CLIPPER SURG (BLADE) IMPLANT
BLADE SURG 10 STRL SS (BLADE) IMPLANT
BLADE SURG 11 STRL SS (BLADE) ×3 IMPLANT
BLADE SURG 15 STRL LF DISP TIS (BLADE) ×2 IMPLANT
BLADE SURG 15 STRL SS (BLADE) ×3
BNDG GAUZE ELAST 4 BULKY (GAUZE/BANDAGES/DRESSINGS) ×2 IMPLANT
CANISTER LIPO FAT HARVEST (MISCELLANEOUS) ×3 IMPLANT
CANISTER SUCT 1200ML W/VALVE (MISCELLANEOUS) IMPLANT
CHLORAPREP W/TINT 26ML (MISCELLANEOUS) ×4 IMPLANT
COMPRESSION GARMENT LG MOREWEL (MISCELLANEOUS) IMPLANT
COMPRESSION GARMENT MD MOREWEL (MISCELLANEOUS) IMPLANT
COMPRESSION GARMENT XL MOREWEL (MISCELLANEOUS) IMPLANT
COVER BACK TABLE 60X90IN (DRAPES) ×3 IMPLANT
COVER MAYO STAND STRL (DRAPES) ×4 IMPLANT
DECANTER SPIKE VIAL GLASS SM (MISCELLANEOUS) IMPLANT
DRAPE IMP U-DRAPE 54X76 (DRAPES) IMPLANT
DRAPE LAPAROTOMY 100X72 PEDS (DRAPES) IMPLANT
DRAPE UNIVERSAL (DRAPES) ×1 IMPLANT
DRSG PAD ABDOMINAL 8X10 ST (GAUZE/BANDAGES/DRESSINGS) ×6 IMPLANT
ELECT COATED BLADE 2.86 ST (ELECTRODE) ×1 IMPLANT
ELECT REM PT RETURN 9FT ADLT (ELECTROSURGICAL) ×3
ELECTRODE REM PT RTRN 9FT ADLT (ELECTROSURGICAL) ×2 IMPLANT
FILTER LIPOSUCTION (MISCELLANEOUS) ×3 IMPLANT
GLOVE BIO SURGEON STRL SZ 6 (GLOVE) ×4 IMPLANT
GOWN STRL REUS W/ TWL LRG LVL3 (GOWN DISPOSABLE) ×4 IMPLANT
GOWN STRL REUS W/TWL LRG LVL3 (GOWN DISPOSABLE) ×6
LINER CANISTER 1000CC FLEX (MISCELLANEOUS) ×3 IMPLANT
LIQUID BAND (GAUZE/BANDAGES/DRESSINGS) ×3 IMPLANT
NDL HYPO 30GX1 BEV (NEEDLE) IMPLANT
NDL PRECISIONGLIDE 27X1.5 (NEEDLE) ×2 IMPLANT
NDL SAFETY ECLIPSE 18X1.5 (NEEDLE) ×2 IMPLANT
NEEDLE HYPO 18GX1.5 SHARP (NEEDLE) ×6
NEEDLE HYPO 30GX1 BEV (NEEDLE) IMPLANT
NEEDLE PRECISIONGLIDE 27X1.5 (NEEDLE) ×3 IMPLANT
NS IRRIG 1000ML POUR BTL (IV SOLUTION) ×2 IMPLANT
PACK BASIN DAY SURGERY FS (CUSTOM PROCEDURE TRAY) ×3 IMPLANT
PACK UNIVERSAL I (CUSTOM PROCEDURE TRAY) IMPLANT
PAD ALCOHOL SWAB (MISCELLANEOUS) ×3 IMPLANT
PENCIL BUTTON HOLSTER BLD 10FT (ELECTRODE) ×1 IMPLANT
SHEET MEDIUM DRAPE 40X70 STRL (DRAPES) ×6 IMPLANT
SLEEVE SCD COMPRESS KNEE MED (MISCELLANEOUS) ×1 IMPLANT
SPONGE GAUZE 2X2 8PLY STRL LF (GAUZE/BANDAGES/DRESSINGS) IMPLANT
SPONGE GAUZE 4X4 12PLY STER LF (GAUZE/BANDAGES/DRESSINGS) IMPLANT
SPONGE LAP 18X18 X RAY DECT (DISPOSABLE) ×4 IMPLANT
STAPLER VISISTAT 35W (STAPLE) ×3 IMPLANT
STRIP CLOSURE SKIN 1/2X4 (GAUZE/BANDAGES/DRESSINGS) ×1 IMPLANT
SUT MNCRL AB 4-0 PS2 18 (SUTURE) ×4 IMPLANT
SUT VIC AB 3-0 PS1 18 (SUTURE)
SUT VIC AB 3-0 PS1 18XBRD (SUTURE) IMPLANT
SUT VIC AB 3-0 SH 27 (SUTURE)
SUT VIC AB 3-0 SH 27X BRD (SUTURE) IMPLANT
SUT VICRYL 4-0 PS2 18IN ABS (SUTURE) ×1 IMPLANT
SYR 50ML LL SCALE MARK (SYRINGE) ×8 IMPLANT
SYR BULB 3OZ (MISCELLANEOUS) IMPLANT
SYR BULB IRRIGATION 50ML (SYRINGE) IMPLANT
SYR CONTROL 10ML LL (SYRINGE) ×3 IMPLANT
SYR TB 1ML LL NO SAFETY (SYRINGE) ×3 IMPLANT
SYRINGE 10CC LL (SYRINGE) ×9 IMPLANT
TOWEL OR 17X24 6PK STRL BLUE (TOWEL DISPOSABLE) ×7 IMPLANT
TRAY DSU PREP LF (CUSTOM PROCEDURE TRAY) ×1 IMPLANT
TUBE CONNECTING 20X1/4 (TUBING) IMPLANT
TUBING INFILTRATION IT-10001 (TUBING) ×3 IMPLANT
TUBING SET GRADUATE ASPIR 12FT (MISCELLANEOUS) ×3 IMPLANT
UNDERPAD 30X30 (UNDERPADS AND DIAPERS) ×8 IMPLANT
YANKAUER SUCT BULB TIP NO VENT (SUCTIONS) IMPLANT

## 2015-09-23 NOTE — Discharge Instructions (Signed)

## 2015-09-23 NOTE — Anesthesia Procedure Notes (Signed)
Procedure Name: LMA Insertion Date/Time: 09/23/2015 7:43 AM Performed by: Maryella Shivers Pre-anesthesia Checklist: Patient identified, Emergency Drugs available, Suction available and Patient being monitored Patient Re-evaluated:Patient Re-evaluated prior to inductionOxygen Delivery Method: Circle System Utilized Preoxygenation: Pre-oxygenation with 100% oxygen Intubation Type: IV induction Ventilation: Mask ventilation without difficulty LMA: LMA inserted LMA Size: 4.0 Number of attempts: 1 Airway Equipment and Method: Bite block Placement Confirmation: positive ETCO2 Tube secured with: Tape Dental Injury: Teeth and Oropharynx as per pre-operative assessment

## 2015-09-23 NOTE — Anesthesia Postprocedure Evaluation (Signed)
Anesthesia Post Note  Patient: Natasha Torres  Procedure(s) Performed: Procedure(s) (LRB): LIPOFILLING FROM BILATERAL THIGHS TO BILATERAL CHEST  (Bilateral) REMOVAL PORT-A-CATH (Right)  Patient location during evaluation: PACU Anesthesia Type: General Level of consciousness: awake and alert Pain management: pain level controlled Vital Signs Assessment: post-procedure vital signs reviewed and stable Respiratory status: spontaneous breathing, nonlabored ventilation, respiratory function stable and patient connected to nasal cannula oxygen Cardiovascular status: blood pressure returned to baseline and stable Postop Assessment: no signs of nausea or vomiting Anesthetic complications: no    Last Vitals:  Filed Vitals:   09/23/15 1030 09/23/15 1100  BP: 112/65 110/76  Pulse: 63 75  Temp:  36.4 C  Resp: 10 18    Last Pain:  Filed Vitals:   09/23/15 1111  PainSc: 4                  Alexei Doswell DAVID

## 2015-09-23 NOTE — Telephone Encounter (Signed)
sch appt per 4/5 pof. Pt aware

## 2015-09-23 NOTE — Interval H&P Note (Signed)
History and Physical Interval Note:  09/23/2015 6:54 AM  [name]  has presented today for surgery, with the diagnosis of HISTORY OF BREAST CANCER,ACQUIRED ABSENCE BILATERAL BREAST,GENETIC PRE DISPOSTION TO BREAST CANCER  The various methods of treatment have been discussed with the patient and family. After consideration of risks, benefits and other options for treatment, the patient has consented to  Procedure(s): LIPOFILLING FROM BILATERAL THIGHS TO BILATERAL CHEST REMOVAL OF RIGHT CHEST PORT A CATH (Right) REMOVAL PORT-A-CATH (Right) as a surgical intervention .  The patient's history has been reviewed, patient examined, no change in status, stable for surgery.  I have reviewed the patient's chart and labs.  Questions were answered to the patient's satisfaction.     Izzy Doubek

## 2015-09-23 NOTE — Op Note (Signed)
Operative Note   DATE OF OPERATION: 4.7.17  LOCATION: [facility]-outpatient  SURGICAL DIVISION: Plastic Surgery  PREOPERATIVE DIAGNOSES:  1. History breast cancer 2. Acquired absence breasts 3. Neoadjuvant chemotherapy, adjuvant Herceptin  POSTOPERATIVE DIAGNOSES:  same  PROCEDURE:  1. Removal right chest port 2. Lipofilling from bilateral medial thighs to bilateral chest.  SURGEON: Irene Limbo MD MBA  ASSISTANT: none  ANESTHESIA:  General.   EBL: minimal  COMPLICATIONS: None immediate.   INDICATIONS FOR PROCEDURE:  The patient, Natasha Torres, is a 35 y.o. female born on [birth year], is here for additional fat grafting to reconstructed breasts to improve contour. She has undergone prior implant based reconstruction following nipple sparing mastectomies. She has completed her adjuvant Herceptin treatment and port will be removed.   FINDINGS: 130 ml fat infiltrated over right chest, 80 ml over left chest.   DESCRIPTION OF PROCEDURE:  The patient was marked in standing position to mark medial thigh donor sites as well as areas of contour depression and animation deformity over superior poles, axillae, and lateral chest wall. The patient was taken to the operating room. SCDs were placed and IV antibiotics were given. The patient's operative site was prepped and draped in a sterile fashion. A time out was performed and all information was confirmed to be correct. Incision made in prior right chest scar over port and carried through subcutaneous tissue and superficial fascia. Port identified, sutures removed and device removed intact. Closure completed with interrupted 4-0 vicryl to close port tract and in dermis. Skin closure completed with 4-0 monocryl subcuticular and steri strips applied.  Stab incisions mad just lateral to groin crease bilateral and tumescent infiltrated over medial thighs, total 250 ml infiltrated. Power assisted liposuction completed to endpoint symmetric  contour and soft tissue thickness, total aspirate 300 ml. Fat prepared by washing and gravity. Stab incisions made over upper outer chest, chest wall and medial chest. Fat infiltrated in subcutaneous plane over superior poles, lateral chest wall, and axillae. Incisions over thighs and chest closed with interrupted 4-0 monocryl. Dry dressing applied over medial thighs and compression garment applied.   The patient was allowed to wake from anesthesia, extubated and taken to the recovery room in satisfactory condition.   SPECIMENS: none  DRAINS: none  Irene Limbo, MD [facility] Plastic & Reconstructive Surgery [phone]

## 2015-09-23 NOTE — Anesthesia Preprocedure Evaluation (Addendum)
Anesthesia Evaluation  Patient identified by MRN, date of birth, ID band Patient awake    Reviewed: Allergy & Precautions, NPO status , Patient's Chart, lab work & pertinent test results  History of Anesthesia Complications Negative for: history of anesthetic complications  Airway Mallampati: I  TM Distance: >3 FB Neck ROM: Full    Dental   Pulmonary neg pulmonary ROS,    Pulmonary exam normal        Cardiovascular Normal cardiovascular exam     Neuro/Psych Anxiety negative neurological ROS     GI/Hepatic   Endo/Other    Renal/GU      Musculoskeletal   Abdominal   Peds  Hematology   Anesthesia Other Findings   Reproductive/Obstetrics                           Anesthesia Physical Anesthesia Plan  ASA: II  Anesthesia Plan: General   Post-op Pain Management:    Induction: Intravenous  Airway Management Planned: LMA  Additional Equipment:   Intra-op Plan:   Post-operative Plan: Extubation in OR  Informed Consent: I have reviewed the patients History and Physical, chart, labs and discussed the procedure including the risks, benefits and alternatives for the proposed anesthesia with the patient or authorized representative who has indicated his/her understanding and acceptance.     Plan Discussed with: CRNA and Surgeon  Anesthesia Plan Comments:         Anesthesia Quick Evaluation

## 2015-09-23 NOTE — Transfer of Care (Signed)
Immediate Anesthesia Transfer of Care Note  Patient: Natasha Torres  Procedure(s) Performed: Procedure(s): LIPOFILLING FROM BILATERAL THIGHS TO BILATERAL CHEST  (Bilateral) REMOVAL PORT-A-CATH (Right)  Patient Location: PACU  Anesthesia Type:General  Level of Consciousness: awake, alert  and oriented  Airway & Oxygen Therapy: Patient Spontanous Breathing and Patient connected to face mask oxygen  Post-op Assessment: Report given to RN and Post -op Vital signs reviewed and stable  Post vital signs: Reviewed and stable  Last Vitals:  Filed Vitals:   09/23/15 0706  BP: 112/69  Pulse: 73  Temp: 36.8 C  Resp: 20    Complications: No apparent anesthesia complications

## 2015-09-26 ENCOUNTER — Other Ambulatory Visit: Payer: Self-pay | Admitting: Hematology and Oncology

## 2015-09-26 ENCOUNTER — Ambulatory Visit: Payer: Managed Care, Other (non HMO) | Admitting: Nurse Practitioner

## 2015-09-26 ENCOUNTER — Encounter (HOSPITAL_BASED_OUTPATIENT_CLINIC_OR_DEPARTMENT_OTHER): Payer: Self-pay | Admitting: Plastic Surgery

## 2015-09-26 ENCOUNTER — Other Ambulatory Visit: Payer: Managed Care, Other (non HMO)

## 2015-09-26 ENCOUNTER — Ambulatory Visit: Payer: Managed Care, Other (non HMO)

## 2015-09-26 ENCOUNTER — Inpatient Hospital Stay: Admission: RE | Admit: 2015-09-26 | Payer: Managed Care, Other (non HMO) | Source: Ambulatory Visit

## 2015-09-26 ENCOUNTER — Encounter: Payer: Self-pay | Admitting: *Deleted

## 2015-09-26 DIAGNOSIS — N6310 Unspecified lump in the right breast, unspecified quadrant: Secondary | ICD-10-CM

## 2015-09-27 MED FILL — TAMOXIFEN 20 MG TABLET: 20 | 90 days supply | Qty: 90 | Fill #2

## 2015-10-07 MED FILL — CARVEDILOL 6.25 MG TABLET: 6.25 | 30 days supply | Qty: 60 | Fill #2

## 2015-10-07 MED FILL — CANDESARTAN CILEXETIL 4 MG: 4 | 30 days supply | Qty: 60 | Fill #4

## 2015-10-13 ENCOUNTER — Ambulatory Visit (HOSPITAL_COMMUNITY)
Admission: RE | Admit: 2015-10-13 | Discharge: 2015-10-13 | Disposition: A | Discharge status: Home / Self Care | Payer: Managed Care, Other (non HMO) | Source: Ambulatory Visit | Attending: Cardiology | Admitting: Cardiology

## 2015-10-13 ENCOUNTER — Ambulatory Visit (HOSPITAL_BASED_OUTPATIENT_CLINIC_OR_DEPARTMENT_OTHER)
Admission: RE | Admit: 2015-10-13 | Discharge: 2015-10-13 | Disposition: A | Discharge status: Home / Self Care | Payer: Managed Care, Other (non HMO) | Source: Ambulatory Visit | Attending: Cardiology | Admitting: Cardiology

## 2015-10-13 ENCOUNTER — Encounter (HOSPITAL_COMMUNITY): Payer: Self-pay

## 2015-10-13 ENCOUNTER — Ambulatory Visit (HOSPITAL_COMMUNITY): Payer: Managed Care, Other (non HMO)

## 2015-10-13 VITALS — BP 129/68 | HR 70 | Wt 182.0 lb

## 2015-10-13 DIAGNOSIS — Z79899 Other long term (current) drug therapy: Secondary | ICD-10-CM | POA: Diagnosis not present

## 2015-10-13 DIAGNOSIS — Z9012 Acquired absence of left breast and nipple: Secondary | ICD-10-CM | POA: Diagnosis not present

## 2015-10-13 DIAGNOSIS — C50412 Malignant neoplasm of upper-outer quadrant of left female breast: Secondary | ICD-10-CM

## 2015-10-13 DIAGNOSIS — I428 Other cardiomyopathies: Secondary | ICD-10-CM

## 2015-10-13 DIAGNOSIS — Z170 Estrogen receptor positive status [ER+]: Secondary | ICD-10-CM | POA: Diagnosis not present

## 2015-10-13 DIAGNOSIS — Z9221 Personal history of antineoplastic chemotherapy: Secondary | ICD-10-CM | POA: Insufficient documentation

## 2015-10-13 DIAGNOSIS — I429 Cardiomyopathy, unspecified: Secondary | ICD-10-CM

## 2015-10-13 MED ORDER — CARVEDILOL 3.125 MG PO TABS: 3.1250 mg | ORAL_TABLET | Freq: Two times a day (BID) | ORAL | Status: DC

## 2015-10-13 MED FILL — CARVEDILOL 3.125 MG TABLET: 3.125 | 30 days supply | Qty: 60 | Fill #0

## 2015-10-13 NOTE — Patient Instructions (Signed)
Stop Candesartan  Decrease Carvedilol to 3.125 mg Twice daily, we have sent you in a new prescription for 3.125 mg tablets  We will contact you in 6 months to schedule your next appointment and echocardiogram

## 2015-10-13 NOTE — Progress Notes (Signed)
  Echocardiogram 2D Echocardiogram has been performed.  Jennette Dubin 10/13/2015, 2:04 PM

## 2015-10-14 NOTE — Progress Notes (Signed)
P  Advanced Heart Failure Clinic Note   Patient ID: Natasha Torres, female   DOB: [birth year], 35 y.o.   MRN: [id number] Primary oncologist: Dr Lindi Adie HF: Dr Aundra Dubin.  35 yo with left breast cancer presents for evaluation of fall in EF and strain by echo monitoring.  Breast cancer was diagnosed in 2/16.  ER+/PR-/HER2+.  Treated with neoadjuvant chemo (carboplatin, docetaxel, Herceptin, and pertuzumab).  She completed this treatment and is now continuing on Herceptin alone to complete 1 year of treatment. She is s/p mastectomy.  She had an echo in 4/16 showed EF 55-60% with GLS -25%.  However, repeat echo in 6/16 showed fall in EF to 50-55% with GLS fallen to -17%.  Echo in 8/16 showed EF down to 45%.  At that time, Herceptin was held for 6 weeks and later restarted.  Echo today shows EF 55-60% with GLS -24.7%.    She returns today for Cardio-oncology follow up.  No lightheadedness.  No SOB, CP, orthopnea, bendopnea, PND, or near syncope.  She has no history of cardiac problems.  No family history of cardiomyopathy or early coronary disease.  She has now completed Herceptin therapy.   Labs (7/16): K 4, creatinine 0.7 Labs (8/16): K 4.3, creatinine 0.64 Labs (9/16): K 3.7, creatinine 0.81 Labs (11/16): K 4.1, creatinine 0.7 Labs (4/17): K 4, creatinine 0.8  PMH: 1. Breast cancer: On left, diagnosed 2/16.  ER+/PR-/HER2+.  Treated with neoadjuvant chemo (carboplatin, docetaxel, Herceptin, and pertuzumab).  She completed this treatment and is now continuing on Herceptin alone to complete 1 year of treatment.  S/p mastectomy.  2. 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  - Echo (4/16) with EF 55-60%, GLS -25%. - Echo (6/16) with EF 50-55%, GLS -17% - Echo (8/16) with EF 45%, GLS -18.6%, lateral s' 13.5 cm/sec - Echo (9/16) with EF 50-55%, no strain done, lateral s' 13.1 cm/sec - Echo (11/16) with EF 50%, GLS -15.8% (poor signal), lateral s' 14.3 cm/sec - Echo (1/17) with EF 55%, GLS  -24.8%, normal diastolic function, normal RV size and systolic function.  - Echo (4/17) with EF 18-59%, normal diastolic function, GLS -09.3%.   Social History   Social History  . Marital Status: Legally Separated    Spouse Name: Harrell Gave  . Number of Children: 1  . Years of Education: N/A   Social History Main Topics  . Smoking status: Never Smoker   . Smokeless tobacco: Never Used  . Alcohol Use: 0.0 oz/week    0 Standard drinks or equivalent per week     Comment: 1-3 drinks 2-3 x/week  . Drug Use: No  . Sexual Activity: Not Currently   Other Topics Concern  . None   Social History Narrative   ** Merged History Encounter **       Family History  Problem Relation Age of Onset  . Hypertension Mother   . Depression Maternal Aunt   . Cancer Maternal Grandfather   . Cancer Paternal Grandfather   . Prostate cancer Father 35  . Dementia Maternal Grandmother   . Heart attack Maternal Grandfather   . Dementia Paternal Grandmother   . Kidney cancer Paternal Grandmother     slow growing, no treatment  . Prostate cancer Paternal Grandfather 15  . Bone cancer Paternal Grandfather 46  . Breast cancer Paternal Grandfather 24  . Lung cancer Paternal Grandfather     dx late 3s; smoker.  thought to be a 4th primary cancer  . Lung cancer Other  smoker  . Prostate cancer Other     MGMs 1/2 brother   ROS: All systems reviewed and negative except as per HPI.  Current Outpatient Prescriptions  Medication Sig Dispense Refill  . BIOTIN PO Take 1 tablet by mouth 2 (two) times daily.     [name] CALCIUM PO Take by mouth.    . carvedilol (COREG) 3.125 MG tablet Take 1 tablet (3.125 mg total) by mouth 2 (two) times daily with a meal. 60 tablet 6  . Multiple Vitamin (MULTIVITAMIN) tablet Take 2 tablets by mouth daily.     [name] oxyCODONE (ROXICODONE) 5 MG immediate release tablet Take 1 tablet (5 mg total) by mouth every 4 (four) hours as needed for severe pain. 30 tablet 0  . tamoxifen  (NOLVADEX) 20 MG tablet Take 1 tablet (20 mg total) by mouth daily. 90 tablet 3  . venlafaxine (EFFEXOR) 75 MG tablet Take 75 mg by mouth daily.    [name] zolpidem (AMBIEN) 5 MG tablet TAKE 1 TABLET BY MOUTH EACH NIGHT AT BEDTIME AS NEEDED FOR SLEEP 30 tablet 0   No current facility-administered medications for this encounter.   BP 129/68 mmHg  Pulse 70  Wt 182 lb (82.555 kg)  SpO2 98%   General: NAD Neck: No JVD, no thyromegaly or thyroid nodule.  Lungs: Clear to auscultation bilaterally with normal respiratory effort. CV: Nondisplaced PMI.  Heart regular S1/S2, no S3/S4, no murmur.  No peripheral edema.  No carotid bruit.  Normal pedal pulses.  Abdomen: Soft, nontender, no hepatosplenomegaly, no distention.  Skin: Intact without lesions or rashes.  Neurologic: Alert and oriented x 3.  Psych: Normal affect. Extremities: No clubbing or cyanosis.  HEENT: Normal.   Assessment/plan: 35 yo with history of breast cancer.  She completed Herceptin therapy this month.  She had a transient fall in EF and strain.  She was started candesartan and Coreg.  I reviewed today's echo, EF back to 55-60% with normal strain pattern.  - She can stop candesartan.  - Continue Coreg until she finishes the current prescription then decrease to 3.125 mg bid.   - Repeat echo in 6 months.  If this is normal, I will let her stop Coreg.   Loralie Champagne  10/14/2015

## 2015-10-18 ENCOUNTER — Telehealth: Payer: Self-pay | Admitting: *Deleted

## 2015-10-18 ENCOUNTER — Other Ambulatory Visit: Payer: Self-pay | Admitting: Hematology and Oncology

## 2015-10-18 DIAGNOSIS — C50412 Malignant neoplasm of upper-outer quadrant of left female breast: Secondary | ICD-10-CM

## 2015-10-18 MED FILL — ZOLPIDEM TARTRATE 5 MG TAB: 5 | 30 days supply | Qty: 30 | Fill #0

## 2015-10-18 NOTE — Telephone Encounter (Signed)
I have called and moved injection appt form 8am to 830am on Friday due to a meeting

## 2015-10-21 ENCOUNTER — Ambulatory Visit (HOSPITAL_BASED_OUTPATIENT_CLINIC_OR_DEPARTMENT_OTHER): Payer: Managed Care, Other (non HMO)

## 2015-10-21 ENCOUNTER — Encounter: Payer: Self-pay | Admitting: *Deleted

## 2015-10-21 ENCOUNTER — Other Ambulatory Visit: Payer: Self-pay | Admitting: Hematology and Oncology

## 2015-10-21 VITALS — BP 130/58 | HR 72 | Temp 98.2°F | Resp 20

## 2015-10-21 DIAGNOSIS — Z5111 Encounter for antineoplastic chemotherapy: Secondary | ICD-10-CM | POA: Diagnosis not present

## 2015-10-21 DIAGNOSIS — C50412 Malignant neoplasm of upper-outer quadrant of left female breast: Secondary | ICD-10-CM | POA: Diagnosis not present

## 2015-10-21 MED ORDER — GOSERELIN ACETATE 3.6 MG ~~LOC~~ IMPL
3.6000 mg | DRUG_IMPLANT | Freq: Once | SUBCUTANEOUS | Status: AC
  Administered 2015-10-21: 3.6 mg via SUBCUTANEOUS
  Filled 2015-10-21: qty 3.6

## 2015-10-21 NOTE — Patient Instructions (Signed)
Goserelin injection What is this medicine? GOSERELIN (GOE se rel in) is similar to a hormone found in the body. It lowers the amount of sex hormones that the body makes. Men will have lower testosterone levels and women will have lower estrogen levels while taking this medicine. In men, this medicine is used to treat prostate cancer; the injection is either given once per month or once every 12 weeks. A once per month injection (only) is used to treat women with endometriosis, dysfunctional uterine bleeding, or advanced breast cancer. This medicine may be used for other purposes; ask your health care provider or pharmacist if you have questions. What should I tell my health care provider before I take this medicine? They need to know if you have any of these conditions (some only apply to women): -diabetes -heart disease or previous heart attack -high blood pressure -high cholesterol -kidney disease -osteoporosis or low bone density -problems passing urine -spinal cord injury -stroke -tobacco smoker -an unusual or allergic reaction to goserelin, hormone therapy, other medicines, foods, dyes, or preservatives -pregnant or trying to get pregnant -breast-feeding How should I use this medicine? This medicine is for injection under the skin. It is given by a health care professional in a hospital or clinic setting. Men receive this injection once every 4 weeks or once every 12 weeks. Women will only receive the once every 4 weeks injection. Talk to your pediatrician regarding the use of this medicine in children. Special care may be needed. Overdosage: If you think you have taken too much of this medicine contact a poison control center or emergency room at once. NOTE: This medicine is only for you. Do not share this medicine with others. What if I miss a dose? It is important not to miss your dose. Call your doctor or health care professional if you are unable to keep an appointment. What may  interact with this medicine? -female hormones like estrogen -herbal or dietary supplements like black cohosh, chasteberry, or DHEA -female hormones like testosterone -prasterone This list may not describe all possible interactions. Give your health care provider a list of all the medicines, herbs, non-prescription drugs, or dietary supplements you use. Also tell them if you smoke, drink alcohol, or use illegal drugs. Some items may interact with your medicine. What should I watch for while using this medicine? Visit your doctor or health care professional for regular checks on your progress. Your symptoms may appear to get worse during the first weeks of this therapy. Tell your doctor or healthcare professional if your symptoms do not start to get better or if they get worse after this time. Your bones may get weaker if you take this medicine for a long time. If you smoke or frequently drink alcohol you may increase your risk of bone loss. A family history of osteoporosis, chronic use of drugs for seizures (convulsions), or corticosteroids can also increase your risk of bone loss. Talk to your doctor about how to keep your bones strong. This medicine should stop regular monthly menstration in women. Tell your doctor if you continue to menstrate. Women should not become pregnant while taking this medicine or for 12 weeks after stopping this medicine. Women should inform their doctor if they wish to become pregnant or think they might be pregnant. There is a potential for serious side effects to an unborn child. Talk to your health care professional or pharmacist for more information. Do not breast-feed an infant while taking this medicine. Men should   inform their doctors if they wish to father a child. This medicine may lower sperm counts. Talk to your health care professional or pharmacist for more information. What side effects may I notice from receiving this medicine? Side effects that you should  report to your doctor or health care professional as soon as possible: -allergic reactions like skin rash, itching or hives, swelling of the face, lips, or tongue -bone pain -breathing problems -changes in vision -chest pain -feeling faint or lightheaded, falls -fever, chills -pain, swelling, warmth in the leg -pain, tingling, numbness in the hands or feet -signs and symptoms of low blood pressure like dizziness; feeling faint or lightheaded, falls; unusually weak or tired -stomach pain -swelling of the ankles, feet, hands -trouble passing urine or change in the amount of urine -unusually high or low blood pressure -unusually weak or tired Side effects that usually do not require medical attention (report to your doctor or health care professional if they continue or are bothersome): -change in sex drive or performance -changes in breast size in both males and females -changes in emotions or moods -headache -hot flashes -irritation at site where injected -loss of appetite -skin problems like acne, dry skin -vaginal dryness This list may not describe all possible side effects. Call your doctor for medical advice about side effects. You may report side effects to FDA at 1-800-FDA-1088. Where should I keep my medicine? This drug is given in a hospital or clinic and will not be stored at home. NOTE: This sheet is a summary. It may not cover all possible information. If you have questions about this medicine, talk to your doctor, pharmacist, or health care provider.    2016, [employer]vier/[employer] Standard. (2013-08-11 11:10:35)  

## 2015-10-24 ENCOUNTER — Telehealth: Payer: Self-pay | Admitting: Hematology and Oncology

## 2015-10-24 ENCOUNTER — Encounter: Payer: Self-pay | Admitting: *Deleted

## 2015-10-24 MED FILL — VENLAFAXINE HCL ER 75 MG CA: 75 | 30 days supply | Qty: 30 | Fill #6

## 2015-10-24 NOTE — Telephone Encounter (Signed)
lvm for pt regarding to 5.9 appt cx and moved to 5.18

## 2015-10-24 NOTE — Progress Notes (Signed)
Received msg from pt to please r/s SCP appt for this week. POF placed and sent to scheduling.

## 2015-10-25 ENCOUNTER — Encounter: Payer: Managed Care, Other (non HMO) | Admitting: Nurse Practitioner

## 2015-11-03 ENCOUNTER — Encounter: Payer: Managed Care, Other (non HMO) | Admitting: Nurse Practitioner

## 2015-11-10 ENCOUNTER — Telehealth: Payer: Self-pay | Admitting: *Deleted

## 2015-11-10 ENCOUNTER — Telehealth: Payer: Self-pay | Admitting: Hematology and Oncology

## 2015-11-10 NOTE — Telephone Encounter (Signed)
err

## 2015-11-10 NOTE — Telephone Encounter (Signed)
s.w. pt and r/s missed appt.....pt ok and aware °

## 2015-11-17 ENCOUNTER — Encounter: Payer: Self-pay | Admitting: Nurse Practitioner

## 2015-11-17 ENCOUNTER — Ambulatory Visit (HOSPITAL_BASED_OUTPATIENT_CLINIC_OR_DEPARTMENT_OTHER): Payer: Managed Care, Other (non HMO) | Admitting: Nurse Practitioner

## 2015-11-17 VITALS — BP 105/89 | HR 65 | Temp 98.2°F | Resp 18 | Ht 71.0 in | Wt 182.4 lb

## 2015-11-17 DIAGNOSIS — C50412 Malignant neoplasm of upper-outer quadrant of left female breast: Secondary | ICD-10-CM

## 2015-11-17 NOTE — Progress Notes (Signed)
CLINIC:  Cancer Survivorship   REASON FOR VISIT:  Routine follow-up post-treatment for a recent history of breast cancer.  BRIEF ONCOLOGIC HISTORY:    Breast cancer of upper-outer quadrant of left female breast ([place])   08/13/2014 Mammogram Left breast: 2 cm circumscribed mass   08/13/2014 Breast US Left breast: two masses: #1: 1:00 10 x 9 x 13 mmm irregular; #2: 2:00: 16 x 7 x 14 mm; no LAD   08/27/2014 Initial Diagnosis O/S excisional biopsy: 2 masses showing 1.5 cm and 0.9 cm IDC, Grade 3, ER + (90%), PR- (0%), HER-2 positive (ratio 2.5), Ki67 ~30%, Multifocal   08/27/2014 Clinical Stage Stage IIA/IIB: T2/T3 N0   09/17/2014 Procedure MyRisk panel ([facility]) 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 Otherwise negative at APC, ATM, BARD1, BMPR1A, BRCA1, BRCA2, BRIP1, CHD1, CDK4, CDKN2A, CHEK2, MLH1, MSH2, MSH6, MUTYH, NBN, PALB2, PMS2, PTEN, RAD51C, RAD51D, SMAD4, STK11, and TP53   09/20/2014 Breast MRI RIGHT: 1 x 0.8 x 0.9 cm lobulated border mass in the retroareolar lower slight lateral area. LEFT: hematoma with surrounding adjacent enhancement encompassing a 4.6 x 4.9 x 4.1 cm area.    09/27/2014 - 01/10/2015 Neo-Adjuvant Chemotherapy Neoadjuvant TCH Perjeta every 3 week 6 followed by Herceptin maintenance completed 09/21/2015   01/13/2015 Breast MRI Postsurgical changes in left breast without residual enhancing masses compatible with treatment response   02/10/2015 Definitive Surgery Bilateral mastectomies: RIGHT: benign.  LEFT: complete path response;  0/3 sentinel nodes   02/10/2015 Pathologic Stage ypT0 ypN0 ypM0   02/22/2015 -  Anti-estrogen oral therapy Zoladex with tamoxifen    INTERVAL HISTORY:  Natasha Torres presents to the [facility] today for our initial meeting to review her survivorship care plan detailing her treatment course for breast cancer, as well as monitoring long-term side effects of that treatment, education regarding health maintenance, screening, and overall wellness  and health promotion.     Overall, Natasha Torres reports doing okay since completing her surgery in August 2016. She continues to follow closely with Dr. Iran Planas during her reconstruction.  She stopped her tamoxifen after 8 months of therapy last week due to extensive aches and pains.  These have begun to improve and she is not sure that she wants to resume therapy at this time.  She denies any mass or lesion along her reconstruction other than the area of fat necrosis that was detected previously.  She is fatigued which has limited her activity.  She continues to see Dr. Aundra Dubin and remains on her carvediol, which she hopes to stop at her next visit now that her heart function has improved following discontinuation of the trastuzuamb.   She denies headache, cough, shortness of breath.  She has back pain and joint pain, as above. She has a good appetite and denies any weight loss.  Of note, her father and paternal aunt recently underwent genetic testing and were found to have the same ATM mutation that she has.  REVIEW OF SYSTEMS:  General: Fatigue as above. Denies fever, chills, unintentional weight loss, or night sweats.  HEENT: Wears glasses. Denies visual changes, hearing loss, mouth sores or difficulty swallowing. Cardiac: Denies palpitations, chest pain, and lower extremity edema.  Respiratory: Denies wheeze or dyspnea on exertion.  Breast: As above. GI: Denies abdominal pain, constipation, diarrhea, nausea, or vomiting.  GU: Denies dysuria, hematuria, vaginal bleeding, vaginal discharge, or vaginal dryness.  Musculoskeletal: As above. Neuro: Denies recent fall or numbness / tingling in her extremities. Skin: Denies rash, pruritis, or open wounds.  Psych: Denies depression, anxiety, insomnia, or memory loss.   A 14-point review of systems was completed and was negative, except as noted above.   ONCOLOGY TREATMENT TEAM:  1. Surgeon:  Dr. Donne Hazel at [facility]  2. Medical  Oncologist: Dr. Lindi Adie 3. Plastic surgeon: Dr. Iran Planas    PAST MEDICAL/SURGICAL HISTORY:  Past Medical History  Diagnosis Date  . Anxiety   . Family history of adverse reaction to anesthesia     pt's mother has hx. of post-op N/V  . History of breast cancer 2016  . Sore in mouth 09/15/2015   Past Surgical History  Procedure Laterality Date  . Wisdom tooth extraction  2001  . Orif finger / thumb fracture Right 1994    thumb  . Portacath placement  08/2014; 01/07/2015  . Mastectomy Bilateral 02/10/2015  . Port-a-cath removal  01/07/2015    removed and replaced  . Nipple sparing mastectomy/sentinal lymph node biopsy/reconstruction/placement of tissue expander Bilateral 02/10/2015    Procedure: BILATERAL  NIPPLE SPARING MASTECTOMY WITH LEFT  SENTINAL LYMPH NODE BIOPSY(RIGHT BREAST PROPHYLACTIC);  Surgeon: Rolm Bookbinder, MD;  Location: [place];  Service: General;  Laterality: Bilateral;  . Breast reconstruction with placement of tissue expander and flex hd (acellular hydrated dermis) Bilateral 02/10/2015    Procedure: BREAST RECONSTRUCTION WITH PLACEMENT OF TISSUE EXPANDER AND FLEX HD (ACELLULAR HYDRATED DERMIS);  Surgeon: Irene Limbo, MD;  Location: [place];  Service: Plastics;  Laterality: Bilateral;  . Removal of tissue expander and placement of implant Right 02/27/2015    Procedure: REMOVAL OF TISSUE EXPANDER AND PLACEMENT OF NEW TISSUE EXPANDER;  Surgeon: Irene Limbo, MD;  Location: [place];  Service: Plastics;  Laterality: Right;  . Incision and drainage of wound Left 03/02/2015    Procedure: IRRIGATION BREAST POCKET AND EXCHANGE OF LEFT BREAST TISSUE EXPANDER;  Surgeon: Irene Limbo, MD;  Location: [place];  Service: Plastics;  Laterality: Left;  . Tissue expander placement Left 03/02/2015    Procedure: TISSUE EXPANDER;  Surgeon: Irene Limbo, MD;  Location: [facility];  Service: Plastics;  Laterality: Left;  . Breast lumpectomy Left 08/2014  . Salpingoophorectomy Right 11/22/2010    . Removal of bilateral tissue expanders with placement of bilateral breast implants Bilateral 06/07/2015    Procedure: REMOVAL OF BILATERAL TISSUE EXPANDERS WITH PLACEMENT OF BILATERAL BREAST  SILICONE IMPLANTS;  Surgeon: Irene Limbo, MD;  Location: [place] [facility];  Service: Plastics;  Laterality: Bilateral;  . Liposuction with lipofilling Bilateral 06/07/2015    Procedure: LIPOSUCTION WITH LIPOFILLING TO BILATERAL CHEST;  Surgeon: Irene Limbo, MD;  Location: [place];  Service: Plastics;  Laterality: Bilateral;  . Liposuction with lipofilling Bilateral 09/23/2015    Procedure: LIPOFILLING FROM BILATERAL THIGHS TO BILATERAL CHEST ;  Surgeon: Irene Limbo, MD;  Location: [place];  Service: Plastics;  Laterality: Bilateral;  . Port-a-cath removal Right 09/23/2015    Procedure: REMOVAL PORT-A-CATH;  Surgeon: Irene Limbo, MD;  Location: [place];  Service: Plastics;  Laterality: Right;     ALLERGIES:  Allergies  Allergen Reactions  . Buprenorphine Hcl Itching  . Morphine And Related Itching  . Other Rash    DERMABOND  . Penicillins Itching  . Tegaderm Ag Mesh [Silver] Rash     CURRENT MEDICATIONS:  Current Outpatient Prescriptions on File Prior to Visit  Medication Sig Dispense Refill  . BIOTIN PO Take 1 tablet by mouth 2 (two) times daily.     [name] CALCIUM PO Take by mouth.    [name]  carvedilol (COREG) 3.125 MG tablet Take 1 tablet (3.125 mg total) by mouth 2 (two) times daily with a meal. 60 tablet 6  . Multiple Vitamin (MULTIVITAMIN) tablet Take 2 tablets by mouth daily.     [name] oxyCODONE (ROXICODONE) 5 MG immediate release tablet Take 1 tablet (5 mg total) by mouth every 4 (four) hours as needed for severe pain. 30 tablet 0  . tamoxifen (NOLVADEX) 20 MG tablet Take 1 tablet (20 mg total) by mouth daily. 90 tablet 3  . venlafaxine (EFFEXOR) 75 MG tablet Take 75 mg by mouth daily.    [name] zolpidem (AMBIEN) 5 MG tablet  TAKE 1 TABLET BY MOUTH EACH NIGHT AT BEDTIME AS NEEDED FOR SLEEP 30 tablet 0   No current facility-administered medications on file prior to visit.     ONCOLOGIC FAMILY HISTORY:  Family History  Problem Relation Age of Onset  . Hypertension Mother   . Depression Maternal Aunt   . Cancer Maternal Grandfather   . Cancer Paternal Grandfather   . Prostate cancer Father 71  . Dementia Maternal Grandmother   . Heart attack Maternal Grandfather   . Dementia Paternal Grandmother   . Kidney cancer Paternal Grandmother     slow growing, no treatment  . Prostate cancer Paternal Grandfather 53  . Bone cancer Paternal Grandfather 68  . Breast cancer Paternal Grandfather 42  . Lung cancer Paternal Grandfather     dx late 27s; smoker.  thought to be a 4th primary cancer  . Lung cancer Other     smoker  . Prostate cancer Other     MGMs 1/2 brother     GENETIC COUNSELING/TESTING: Yes, performed 09/17/2014: MyRisk panel (Myriad) 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 Otherwise negative at APC, ATM, BARD1, BMPR1A, BRCA1, BRCA2, BRIP1, CHD1, CDK4, CDKN2A, CHEK2, MLH1, MSH2, MSH6, MUTYH, NBN, PALB2, PMS2, PTEN, RAD51C, RAD51D, SMAD4, STK11, and TP53   SOCIAL HISTORY:  Natasha Torres is single and lives with her family in [place], [place].  She has 1 child. 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  She denies any current or history of tobacco or illicit drug use, and uses alcohol on a social basis.   PHYSICAL EXAMINATION:  Vital Signs: Filed Vitals:   11/17/15 1548  BP: 105/89  Pulse: 65  Temp: 98.2 F (36.8 C)  Resp: 18   ECOG Performance Status: 0  General: Well-nourished, well-appearing female in no acute distress.  She is unaccompanied in clinic today.   HEENT: Head is atraumatic and normocephalic.  Pupils equal and reactive to light and accomodation. Conjunctivae clear without exudate.  Sclerae anicteric. Oral mucosa is pink, moist,  and intact without lesions.  Oropharynx is pink without lesions or erythema.  Lymph: No cervical, supraclavicular, infraclavicular, or axillary lymphadenopathy noted on palpation.  Cardiovascular: Regular rate and rhythm without murmurs, rubs, or gallops. Respiratory: Clear to auscultation bilaterally. Chest expansion symmetric without accessory muscle use on inspiration or expiration.  GI: Abdomen soft and round. No tenderness to palpation. Bowel sounds normoactive in 4 quadrants. GU: Deferred.   Neuro: No focal deficits. Steady gait.  Psych: Mood and affect normal and appropriate for situation.  Extremities: No edema, cyanosis, or clubbing.  Skin: Warm and dry. No open lesions noted.   LABORATORY DATA:  None for this visit.  DIAGNOSTIC IMAGING:  None for this visit.     ASSESSMENT AND PLAN:   1. 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  Natasha Torres is doing well without clinical symptoms worrisome for disease recurrence. She will follow-up with her medical oncologist,  Dr. Lindi Adie in October 2017 with history and physical examination per surveillance protocol.  As above, she stopped her tamoxifen approximately one week ago due to increased joint aches and pains.  These are better today and she will continue her drug holiday for an additional week.  I will call and check on her symptoms in one week's time and we will make further recommendations at that time.  She will continue on her other medications including her zoladex injections.   A comprehensive survivorship care plan and treatment summary was reviewed with the patient today detailing her breast  cancer diagnosis, treatment course, potential late/long-term effects of treatment, appropriate follow-up care with recommendations for the future, and patient education resources.  A copy of this summary, along with a letter will be sent to the patient's primary care provider via in basket message after today's visit.  Natasha Torres is welcome to return to the Survivorship Clinic in the future, as needed; no follow-up will be scheduled at this time.  She will continue to see Dr. Aundra Dubin for her heart function.  2. Bone health:  Given Ms. Arabie's age/history of breast cancer and her current treatment regimen including endocrine therapy with endocrine therapy, she is at risk for bone demineralization. As she was premenopausal prior to diagnosis, she does not have a baseline DEXA at this time.  We will continue to monitor this closely while she is on endocrine therapy.  In the meantime, she was encouraged to increase her consumption of foods rich in calcium and vitamin D as well as to increase her weight-bearing activities.  She was given education on specific activities to promote bone health.  3. Cancer screening:  Due to Natasha Torres's history and her age, she should receive screening for skin cancers, colon cancer (beginning at age 64), and gynecologic cancers.  The information and recommendations are listed on the patient's co mprehensive care plan/treatment summary and were reviewed in detail with the patient.    4. Health maintenance and wellness promotion: Natasha Torres was encouraged to consume 5-7 servings of fruits and vegetables per day. We reviewed the "Nutrition Rainbow" handout, as well as discussed recommendations to maximize nutrition and minimize recurrence, such as increased intake of fruits, vegetables, lean proteins, and minimizing the intake of red meats and processed foods.  She was also encouraged to engage in moderate to vigorous exercise for 30 minutes per day most days of the week.  We discussed the [employer] fitness program, which is designed for cancer survivors to help them become more physically fit after cancer treatments.  She was instructed to limit her alcohol consumption and continue to abstain from tobacco use.  A copy of the "Take Control of Your Health" brochure was given to her reinforcing these recommendations.   5. Support services/counseling: It is not uncommon for this period of the patient's cancer care trajectory to be one of many emotions and stressors.  We discussed an opportunity for her to participate in the next session of [facility] ("Finding Your New Normal") support group series designed for patients after they have completed treatment.   Natasha Torres was  encouraged to take advantage of our many other support services programs, support groups, and/or counseling in coping with her new life as a cancer survivor after completing anti-cancer treatment.  She was offered support today through active listening and expressive supportive counseling.  She was given information regarding our available services and encouraged to contact me with any questions or for help enrolling in any of our support group/programs.    A total of 50 minutes of face-to-face time was spent with this patient with greater than 50% of that time in counseling and care-coordination.   Sylvan Cheese, NP  Survivorship Program [facility] [phone]   Note: PRIMARY CARE PROVIDER [place], [place] [phone]

## 2015-11-18 ENCOUNTER — Ambulatory Visit (HOSPITAL_BASED_OUTPATIENT_CLINIC_OR_DEPARTMENT_OTHER): Payer: Managed Care, Other (non HMO)

## 2015-11-18 VITALS — BP 127/61 | HR 73 | Temp 98.4°F | Resp 16

## 2015-11-18 DIAGNOSIS — Z5111 Encounter for antineoplastic chemotherapy: Secondary | ICD-10-CM

## 2015-11-18 DIAGNOSIS — C50412 Malignant neoplasm of upper-outer quadrant of left female breast: Secondary | ICD-10-CM | POA: Diagnosis not present

## 2015-11-18 MED ORDER — GOSERELIN ACETATE 3.6 MG ~~LOC~~ IMPL
3.6000 mg | DRUG_IMPLANT | Freq: Once | SUBCUTANEOUS | Status: AC
  Administered 2015-11-18: 3.6 mg via SUBCUTANEOUS
  Filled 2015-11-18: qty 3.6

## 2015-11-18 NOTE — Patient Instructions (Signed)
Goserelin injection What is this medicine? GOSERELIN (GOE se rel in) is similar to a hormone found in the body. It lowers the amount of sex hormones that the body makes. Men will have lower testosterone levels and women will have lower estrogen levels while taking this medicine. In men, this medicine is used to treat prostate cancer; the injection is either given once per month or once every 12 weeks. A once per month injection (only) is used to treat women with endometriosis, dysfunctional uterine bleeding, or advanced breast cancer. This medicine may be used for other purposes; ask your health care provider or pharmacist if you have questions. What should I tell my health care provider before I take this medicine? They need to know if you have any of these conditions (some only apply to women): -diabetes -heart disease or previous heart attack -high blood pressure -high cholesterol -kidney disease -osteoporosis or low bone density -problems passing urine -spinal cord injury -stroke -tobacco smoker -an unusual or allergic reaction to goserelin, hormone therapy, other medicines, foods, dyes, or preservatives -pregnant or trying to get pregnant -breast-feeding How should I use this medicine? This medicine is for injection under the skin. It is given by a health care professional in a hospital or clinic setting. Men receive this injection once every 4 weeks or once every 12 weeks. Women will only receive the once every 4 weeks injection. Talk to your pediatrician regarding the use of this medicine in children. Special care may be needed. Overdosage: If you think you have taken too much of this medicine contact a poison control center or emergency room at once. NOTE: This medicine is only for you. Do not share this medicine with others. What if I miss a dose? It is important not to miss your dose. Call your doctor or health care professional if you are unable to keep an appointment. What may  interact with this medicine? -female hormones like estrogen -herbal or dietary supplements like black cohosh, chasteberry, or DHEA -female hormones like testosterone -prasterone This list may not describe all possible interactions. Give your health care provider a list of all the medicines, herbs, non-prescription drugs, or dietary supplements you use. Also tell them if you smoke, drink alcohol, or use illegal drugs. Some items may interact with your medicine. What should I watch for while using this medicine? Visit your doctor or health care professional for regular checks on your progress. Your symptoms may appear to get worse during the first weeks of this therapy. Tell your doctor or healthcare professional if your symptoms do not start to get better or if they get worse after this time. Your bones may get weaker if you take this medicine for a long time. If you smoke or frequently drink alcohol you may increase your risk of bone loss. A family history of osteoporosis, chronic use of drugs for seizures (convulsions), or corticosteroids can also increase your risk of bone loss. Talk to your doctor about how to keep your bones strong. This medicine should stop regular monthly menstration in women. Tell your doctor if you continue to menstrate. Women should not become pregnant while taking this medicine or for 12 weeks after stopping this medicine. Women should inform their doctor if they wish to become pregnant or think they might be pregnant. There is a potential for serious side effects to an unborn child. Talk to your health care professional or pharmacist for more information. Do not breast-feed an infant while taking this medicine. Men should   inform their doctors if they wish to father a child. This medicine may lower sperm counts. Talk to your health care professional or pharmacist for more information. What side effects may I notice from receiving this medicine? Side effects that you should  report to your doctor or health care professional as soon as possible: -allergic reactions like skin rash, itching or hives, swelling of the face, lips, or tongue -bone pain -breathing problems -changes in vision -chest pain -feeling faint or lightheaded, falls -fever, chills -pain, swelling, warmth in the leg -pain, tingling, numbness in the hands or feet -signs and symptoms of low blood pressure like dizziness; feeling faint or lightheaded, falls; unusually weak or tired -stomach pain -swelling of the ankles, feet, hands -trouble passing urine or change in the amount of urine -unusually high or low blood pressure -unusually weak or tired Side effects that usually do not require medical attention (report to your doctor or health care professional if they continue or are bothersome): -change in sex drive or performance -changes in breast size in both males and females -changes in emotions or moods -headache -hot flashes -irritation at site where injected -loss of appetite -skin problems like acne, dry skin -vaginal dryness This list may not describe all possible side effects. Call your doctor for medical advice about side effects. You may report side effects to FDA at 1-800-FDA-1088. Where should I keep my medicine? This drug is given in a hospital or clinic and will not be stored at home. NOTE: This sheet is a summary. It may not cover all possible information. If you have questions about this medicine, talk to your doctor, pharmacist, or health care provider.    2016, [employer]vier/[employer] Standard. (2013-08-11 11:10:35)  

## 2015-11-25 ENCOUNTER — Telehealth: Payer: Self-pay | Admitting: Nurse Practitioner

## 2015-11-25 ENCOUNTER — Telehealth: Payer: Self-pay | Admitting: Hematology and Oncology

## 2015-11-25 NOTE — Telephone Encounter (Signed)
Called and spoke with patient in follow up from visit 11/17/15.  Natasha Torres has now been off tamoxifen x 2 weeks due to arthralgias and myalgias.  She reports continued improvement in symptoms subjectively with less need for pain medication objectively.  She is not due to see her medical oncologist, Dr. Lindi Adie, until October 2017.  We will arrange earlier follow up for her with Dr. Lindi Adie so that she can discuss treatment options.  She was advised to remain off the medication at this time and to continue to maintain log of her symptoms so that she can discuss these with Dr. Lindi Adie.  No additional questions at this time.

## 2015-11-25 NOTE — Telephone Encounter (Signed)
s.w. pt and advised on June appt....pt ok and aware °

## 2015-11-28 NOTE — Telephone Encounter (Signed)
Thanks

## 2015-11-29 MED FILL — VENLAFAXINE HCL ER 37.5 MG: 37.5 | 30 days supply | Qty: 30 | Fill #2

## 2015-12-07 ENCOUNTER — Other Ambulatory Visit: Payer: Self-pay | Admitting: Hematology and Oncology

## 2015-12-12 ENCOUNTER — Telehealth: Payer: Self-pay | Admitting: Hematology and Oncology

## 2015-12-12 ENCOUNTER — Ambulatory Visit (HOSPITAL_BASED_OUTPATIENT_CLINIC_OR_DEPARTMENT_OTHER): Payer: Managed Care, Other (non HMO) | Admitting: Hematology and Oncology

## 2015-12-12 ENCOUNTER — Encounter: Payer: Self-pay | Admitting: Hematology and Oncology

## 2015-12-12 VITALS — BP 121/76 | HR 72 | Temp 98.4°F | Resp 18 | Ht 71.0 in | Wt 185.6 lb

## 2015-12-12 DIAGNOSIS — R635 Abnormal weight gain: Secondary | ICD-10-CM | POA: Diagnosis not present

## 2015-12-12 DIAGNOSIS — C50412 Malignant neoplasm of upper-outer quadrant of left female breast: Secondary | ICD-10-CM

## 2015-12-12 DIAGNOSIS — M791 Myalgia: Secondary | ICD-10-CM | POA: Diagnosis not present

## 2015-12-12 NOTE — Progress Notes (Signed)
Patient Care Team: Loraine Leriche, MD as PCP - General (Internal Medicine) Nicholas Lose, MD as Consulting Physician (Hematology and Oncology) Rolm Bookbinder, MD as Consulting Physician (General Surgery) Irene Limbo, MD as Consulting Physician (Plastic Surgery) Rockwell Germany, RN as Registered Nurse Mauro Kaufmann, RN as Registered Nurse Sylvan Cheese, NP as Nurse Practitioner (Hematology and Oncology) Paula Compton, MD as Consulting Physician (Obstetrics and Gynecology)  DIAGNOSIS: Breast cancer of upper-outer quadrant of left female breast [facility]   Staging form: Breast, AJCC 7th Edition     Clinical stage from 08/27/2014: Stage IIA (T2, N0, M0) - Unsigned     Pathologic: T0, N0, cM0 - Unsigned   SUMMARY OF ONCOLOGIC HISTORY:   Breast cancer of upper-outer quadrant of left female breast ([place])   08/13/2014 Mammogram Left breast: 2 cm circumscribed mass   08/13/2014 Breast US Left breast: two masses: #1: 1:00 10 x 9 x 13 mmm irregular; #2: 2:00: 16 x 7 x 14 mm; no LAD   08/27/2014 Initial Diagnosis O/S excisional biopsy: 2 masses showing 1.5 cm and 0.9 cm IDC, Grade 3, ER + (90%), PR- (0%), HER-2 positive (ratio 2.5), Ki67 ~30%, Multifocal   08/27/2014 Clinical Stage Stage IIA/IIB: T2/T3 N0   09/17/2014 Procedure MyRisk panel ([facility]) 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 Otherwise negative at APC, ATM, BARD1, BMPR1A, BRCA1, BRCA2, BRIP1, CHD1, CDK4, CDKN2A, CHEK2, MLH1, MSH2, MSH6, MUTYH, NBN, PALB2, PMS2, PTEN, RAD51C, RAD51D, SMAD4, STK11, and TP53   09/20/2014 Breast MRI RIGHT: 1 x 0.8 x 0.9 cm lobulated border mass in the retroareolar lower slight lateral area. LEFT: hematoma with surrounding adjacent enhancement encompassing a 4.6 x 4.9 x 4.1 cm area.    09/27/2014 - 01/10/2015 Neo-Adjuvant Chemotherapy Neoadjuvant TCH Perjeta every 3 week 6 followed by Herceptin maintenance completed 09/21/2015   01/13/2015 Breast MRI Postsurgical changes in left breast without  residual enhancing masses compatible with treatment response   02/10/2015 Definitive Surgery Bilateral mastectomies: RIGHT: benign.  LEFT: complete path response;  0/3 sentinel nodes   02/10/2015 Pathologic Stage ypT0 ypN0 ypM0   02/22/2015 -  Anti-estrogen oral therapy Zoladex with tamoxifen   11/17/2015 Survivorship Survivorship care plan completed and given to patient    CHIEF COMPLIANT: Follow-up on antiestrogen therapy  INTERVAL HISTORY: Natasha Torres is a 35 year old with above-mentioned history of left breast cancer with neoadjuvant chemotherapy and had a complete response and then had bilateral mastectomies with reconstruction. She has been on antiestrogen therapy with Zoladex and tamoxifen since September 2016. Initially she tolerated the treatment fairly well but recently she has been having increasing musculoskeletal aches and pains to the point that she is taking 4-5 more transitory day along with oxycodone at bedtime. We stopped the tamoxifen therapy and she is here today to discuss what to do about it. She is also extremely emotional about the fact that she still feels achy and not completely normal. She continues to gain weight which has also been on her mind. No matter what she does she is not able to lose the weight. She has been promoted at work and that has been financially helpful.  REVIEW OF SYSTEMS:   Constitutional: Denies fevers, chills or abnormal weight loss Eyes: Denies blurriness of vision Ears, nose, mouth, throat, and face: Denies mucositis or sore throat Respiratory: Denies cough, dyspnea or wheezes Cardiovascular: Denies palpitation, chest discomfort Gastrointestinal:  Denies nausea, heartburn or change in bowel habits Skin: Denies abnormal skin rashes Lymphatics: Denies new lymphadenopathy or easy bruising  Neurological:Denies numbness, tingling or new weaknesses Behavioral/Psych: Mood is stable, no new changes  Extremities: Musculoskeletal aches and pains, nail  changes with brittle nails especially in the feet Breast: Bilateral breast reconstructive All other systems were reviewed with the patient and are negative.  I have reviewed the past medical history, past surgical history, social history and family history with the patient and they are unchanged from previous note.  ALLERGIES:  is allergic to buprenorphine hcl; morphine and related; other; penicillins; and tegaderm ag mesh.  MEDICATIONS:  Current Outpatient Prescriptions  Medication Sig Dispense Refill  . BIOTIN PO Take 1 tablet by mouth 2 (two) times daily.     [name] CALCIUM PO Take by mouth.    . carvedilol (COREG) 3.125 MG tablet Take 1 tablet (3.125 mg total) by mouth 2 (two) times daily with a meal. 60 tablet 6  . Multiple Vitamin (MULTIVITAMIN) tablet Take 2 tablets by mouth daily.     [name] oxyCODONE (ROXICODONE) 5 MG immediate release tablet Take 1 tablet (5 mg total) by mouth every 4 (four) hours as needed for severe pain. 30 tablet 0  . tamoxifen (NOLVADEX) 20 MG tablet Take 1 tablet (20 mg total) by mouth daily. 90 tablet 3  . venlafaxine (EFFEXOR) 75 MG tablet Take 75 mg by mouth daily.    [name] zolpidem (AMBIEN) 5 MG tablet TAKE 1 TABLET BY MOUTH EACH NIGHT AT BEDTIME AS NEEDED FOR SLEEP 30 tablet 0   No current facility-administered medications for this visit.    PHYSICAL EXAMINATION: ECOG PERFORMANCE STATUS: 1 - Symptomatic but completely ambulatory  Filed Vitals:   12/12/15 0827  BP: 121/76  Pulse: 72  Temp: 98.4 F (36.9 C)  Resp: 18   Filed Weights   12/12/15 0827  Weight: 185 lb 9.6 oz (84.188 kg)    GENERAL:alert, no distress and comfortable SKIN: skin color, texture, turgor are normal, no rashes or significant lesions EYES: normal, Conjunctiva are pink and non-injected, sclera clear OROPHARYNX:no exudate, no erythema and lips, buccal mucosa, and tongue normal  NECK: supple, thyroid normal size, non-tender, without nodularity LYMPH:  no palpable lymphadenopathy in  the cervical, axillary or inguinal LUNGS: clear to auscultation and percussion with normal breathing effort HEART: regular rate & rhythm and no murmurs and no lower extremity edema ABDOMEN:abdomen soft, non-tender and normal bowel sounds MUSCULOSKELETAL:no cyanosis of digits and no clubbing  NEURO: alert & oriented x 3 with fluent speech, no focal motor/sensory deficits EXTREMITIES: No lower extremity edema  LABORATORY DATA:  I have reviewed the data as listed   Chemistry      Component Value Date/Time   NA 140 09/21/2015 1340   NA 133* 02/27/2015 1135   K 4.0 09/21/2015 1340   K 3.7 02/27/2015 1135   CL 99* 02/27/2015 1135   CO2 27 09/21/2015 1340   CO2 25 02/27/2015 1135   BUN 19.6 09/21/2015 1340   BUN 16 02/27/2015 1135   CREATININE 0.8 09/21/2015 1340   CREATININE 0.81 02/27/2015 1135      Component Value Date/Time   CALCIUM 9.3 09/21/2015 1340   CALCIUM 8.8* 02/27/2015 1135   ALKPHOS 57 09/21/2015 1340   ALKPHOS 41 02/27/2015 1135   AST 25 09/21/2015 1340   AST 24 02/27/2015 1135   ALT 15 09/21/2015 1340   ALT 13* 02/27/2015 1135   BILITOT <0.30 09/21/2015 1340   BILITOT 0.5 02/27/2015 1135       Lab Results  Component Value Date   WBC 4.9 09/21/2015  HGB 11.4* 09/21/2015   HCT 34.6* 09/21/2015   MCV 90.8 09/21/2015   PLT 180 09/21/2015   NEUTROABS 2.8 09/21/2015     ASSESSMENT & PLAN:  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 Biopsy of the right breast mass and left axillary lymph node and the secondary mass left breast back as benign. Status post neo-adjuvant TCH Perjeta 6 cycles  from 09/27/2014 to 01/10/2015 Followed by bilateral mastectomies on 02/10/2015: No malignancy in either breast Herceptin maintenance completed 09/21/2015 ----------------------------------------------------------------------------------------------------------------------------------------------------- Current treatment: Zoladex with tamoxifen started 02/22/2015 Stopped 11/17/2015 due to musculoskeletal aches and pains Tamoxifen toxicities: 1. Hot flashes improved with Effexor 2. Depression symptoms also improved with Effexor 3. Arthralgias and myalgias: Patient stopped tamoxifen June 2017   We had lengthy discussion about the role of antiestrogen therapy treatment. I believe that the patient absolutely needs antiestrogen therapy. We discussed different options including Zoladex with anastrozole versus tamoxifen alone. After listening to all the data for risks and benefits of each of these options, Asian wanted to take a short break of 3 months and then resume tamoxifen alone October 1  Weight issues: We discussed the role of intermittent fasting and provided her with resources. Patient did not benefit from [facility] treatment.  Palpable lumps: Fat necrosis Return to clinic in 6 months for follow-up to assess tolerability to tamoxifen therapy.  No orders of the defined types were placed in this encounter.   The patient has a good understanding of the overall plan. she agrees with it. she will call with any problems that may develop before the next visit here.   Rulon Eisenmenger, MD 12/12/2015

## 2015-12-12 NOTE — Assessment & Plan Note (Signed)
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 Biopsy of the right breast mass and left axillary lymph node and the secondary mass left breast back as benign. Status post neo-adjuvant TCH Perjeta 6 cycles from 09/27/2014 to 01/10/2015 Followed by bilateral mastectomies on 02/10/2015: No malignancy in either breast Herceptin maintenance completed 09/21/2015 ----------------------------------------------------------------------------------------------------------------------------------------------------- Current treatment: Zoladex with tamoxifen started 02/22/2015 Tamoxifen toxicities: 1. Hot flashes improved with Effexor 2. Depression symptoms also improved with Effexor 3. Arthralgias and myalgias: Patient stopped tamoxifen June 2017 for a 2 week break with improvement in her symptoms.  Weight issues: We discussed the role of intermittent fasting provided her with resources. Patient did not benefit from [facility] treatment.  Palpable lumps: Fat necrosis Return to clinic in 6 months for follow-up

## 2015-12-12 NOTE — Telephone Encounter (Signed)
appt made and avs printed °

## 2015-12-23 ENCOUNTER — Ambulatory Visit: Payer: Managed Care, Other (non HMO)

## 2016-01-03 IMAGING — CR DG CHEST 1V PORT
1 series · 1 of 1 positions shown · non-contrast
Comparison: 11/19/2014

CLINICAL DATA: Nausea vomiting and right-sided chest pain. History
of breast cancer.

EXAM:
PORTABLE CHEST - 1 VIEW

[AP]
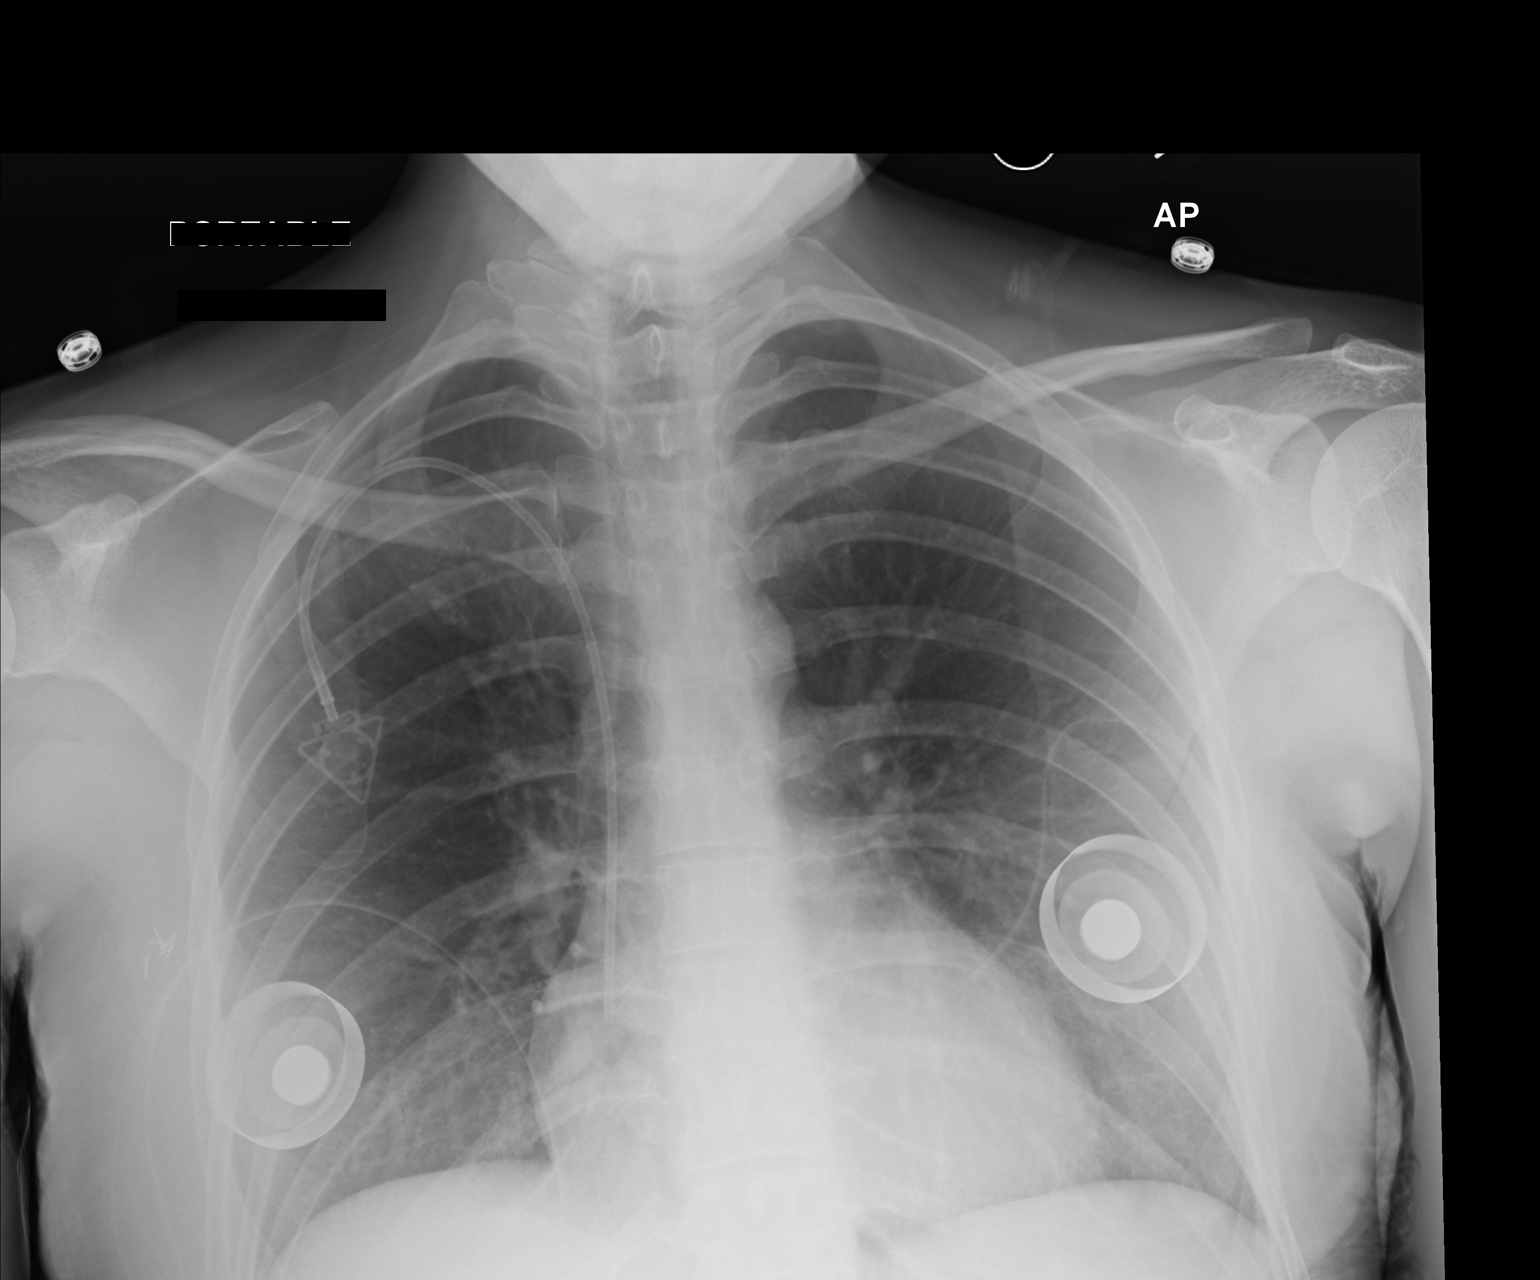

[1 of 1 positions shown; findings below may reference images not displayed]

FINDINGS: Bilateral chest wall tissue expanders noted. There is a right chest
wall port a catheter with tip in the projection of the cavoatrial
junction. The heart size and mediastinal contours are within normal
limits. Both lungs are clear. The visualized skeletal structures are
unremarkable.
IMPRESSION: No acute cardiopulmonary abnormalities.

## 2016-01-20 ENCOUNTER — Ambulatory Visit: Payer: Managed Care, Other (non HMO)

## 2016-02-17 ENCOUNTER — Ambulatory Visit: Payer: Managed Care, Other (non HMO)

## 2016-02-17 MED FILL — VENLAFAXINE HCL ER 37.5 MG: 37.5 | 30 days supply | Qty: 30 | Fill #3

## 2016-03-07 ENCOUNTER — Other Ambulatory Visit: Payer: Self-pay | Admitting: Hematology and Oncology

## 2016-03-07 DIAGNOSIS — C50919 Malignant neoplasm of unspecified site of unspecified female breast: Secondary | ICD-10-CM

## 2016-03-08 MED FILL — TAMOXIFEN 20 MG TABLET: 20 | 90 days supply | Qty: 90 | Fill #0

## 2016-03-08 NOTE — Telephone Encounter (Signed)
Chart reviewed.

## 2016-03-09 ENCOUNTER — Other Ambulatory Visit: Payer: Self-pay | Admitting: Hematology and Oncology

## 2016-03-09 MED FILL — VENLAFAXINE HCL ER 75 MG CA: 75 | 30 days supply | Qty: 30 | Fill #0

## 2016-03-09 NOTE — Telephone Encounter (Signed)
Chart reviewed.

## 2016-03-22 ENCOUNTER — Ambulatory Visit: Payer: Managed Care, Other (non HMO) | Admitting: Hematology and Oncology

## 2016-03-23 ENCOUNTER — Ambulatory Visit: Payer: Managed Care, Other (non HMO)

## 2016-04-10 ENCOUNTER — Ambulatory Visit (HOSPITAL_COMMUNITY)
Admission: RE | Admit: 2016-04-10 | Discharge: 2016-04-10 | Disposition: A | Discharge status: Home / Self Care | Payer: Managed Care, Other (non HMO) | Source: Ambulatory Visit | Attending: Internal Medicine | Admitting: Internal Medicine

## 2016-04-10 ENCOUNTER — Encounter (HOSPITAL_COMMUNITY): Payer: Self-pay

## 2016-04-10 ENCOUNTER — Ambulatory Visit (HOSPITAL_BASED_OUTPATIENT_CLINIC_OR_DEPARTMENT_OTHER)
Admission: RE | Admit: 2016-04-10 | Discharge: 2016-04-10 | Disposition: A | Discharge status: Home / Self Care | Payer: Managed Care, Other (non HMO) | Source: Ambulatory Visit

## 2016-04-10 VITALS — BP 102/74 | HR 67 | Wt 181.5 lb

## 2016-04-10 DIAGNOSIS — C50412 Malignant neoplasm of upper-outer quadrant of left female breast: Secondary | ICD-10-CM | POA: Insufficient documentation

## 2016-04-10 DIAGNOSIS — Z170 Estrogen receptor positive status [ER+]: Secondary | ICD-10-CM | POA: Insufficient documentation

## 2016-04-10 DIAGNOSIS — I428 Other cardiomyopathies: Secondary | ICD-10-CM

## 2016-04-10 DIAGNOSIS — Z9012 Acquired absence of left breast and nipple: Secondary | ICD-10-CM | POA: Insufficient documentation

## 2016-04-10 DIAGNOSIS — Z79810 Long term (current) use of selective estrogen receptor modulators (SERMs): Secondary | ICD-10-CM | POA: Insufficient documentation

## 2016-04-10 DIAGNOSIS — Z9221 Personal history of antineoplastic chemotherapy: Secondary | ICD-10-CM | POA: Diagnosis not present

## 2016-04-10 DIAGNOSIS — Z8249 Family history of ischemic heart disease and other diseases of the circulatory system: Secondary | ICD-10-CM | POA: Diagnosis not present

## 2016-04-10 NOTE — Progress Notes (Signed)
*  PRELIMINARY RESULTS* Echocardiogram 2D Echocardiogram has been performed.  Leavy Cella 04/10/2016, 9:51 AM

## 2016-04-10 NOTE — Progress Notes (Signed)
P  Advanced Heart Failure Clinic Note   Patient ID: Natasha Torres, female   DOB: 02-14-1981, 35 y.o.   MRN: [id number] Primary oncologist: Dr Lindi Adie HF: Dr Aundra Dubin.  35 yo with left breast cancer presents for evaluation of fall in EF and strain by echo monitoring.  Breast cancer was diagnosed in 2/16.  ER+/PR-/HER2+.  Treated with neoadjuvant chemo (carboplatin, docetaxel, Herceptin, and pertuzumab).  She completed this treatment and is now continuing on Herceptin alone to complete 1 year of treatment. She is s/p mastectomy.  She had an echo in 4/16 showed EF 55-60% with GLS -25%.  However, repeat echo in 6/16 showed fall in EF to 50-55% with GLS fallen to -17%.  Echo in 8/16 showed EF down to 45%.  At that time, Herceptin was held for 6 weeks and later restarted.  Echo 4/17 showed EF 55-60% with GLS -24.7%.    She returns today for Cardio-oncology follow up.  No lightheadedness.  No SOB, CP, orthopnea, bendopnea, PND, or near syncope.  She has no history of cardiac problems.  No family history of cardiomyopathy or early coronary disease.  She has now completed Herceptin therapy.   Labs (7/16): K 4, creatinine 0.7 Labs (8/16): K 4.3, creatinine 0.64 Labs (9/16): K 3.7, creatinine 0.81 Labs (11/16): K 4.1, creatinine 0.7 Labs (4/17): K 4, creatinine 0.8  PMH: 1. Breast cancer: On left, diagnosed 2/16.  ER+/PR-/HER2+.  Treated with neoadjuvant chemo (carboplatin, docetaxel, Herceptin, and pertuzumab).  She completed this treatment and is now continuing on Herceptin alone to complete 1 year of treatment.  S/p mastectomy.  2. 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  - Echo (4/16) with EF 55-60%, GLS -25%. - Echo (6/16) with EF 50-55%, GLS -17% - Echo (8/16) with EF 45%, GLS -18.6%, lateral s' 13.5 cm/sec - Echo (9/16) with EF 50-55%, no strain done, lateral s' 13.1 cm/sec - Echo (11/16) with EF 50%, GLS -15.8% (poor signal), lateral s' 14.3 cm/sec - Echo (1/17) with EF 55%, GLS  -14.4%, normal diastolic function, normal RV size and systolic function.  - Echo (4/17) with EF 31-54%, normal diastolic function, GLS -00.8%.  - Echo (10/17) with EF 67%, normal diastolic function, GLS -61.9%, normal RV size and systolic function.   Social History   Social History  . Marital status: Married    Spouse name: Harrell Gave  . Number of children: 1  . Years of education: N/A   Social History Main Topics  . Smoking status: Never Smoker  . Smokeless tobacco: Never Used  . Alcohol use 0.0 oz/week     Comment: 1-3 drinks 2-3 x/week  . Drug use: No  . Sexual activity: Not Currently   Other Topics Concern  . None   Social History Narrative   ** Merged History Encounter **       Family History  Problem Relation Age of Onset  . Hypertension Mother   . Depression Maternal Aunt   . Cancer Maternal Grandfather   . Cancer Paternal Grandfather   . Prostate cancer Father 42  . Dementia Maternal Grandmother   . Heart attack Maternal Grandfather   . Dementia Paternal Grandmother   . Kidney cancer Paternal Grandmother     slow growing, no treatment  . Prostate cancer Paternal Grandfather 44  . Bone cancer Paternal Grandfather 47  . Breast cancer Paternal Grandfather 62  . Lung cancer Paternal Grandfather     dx late 76s; smoker.  thought to be a 4th primary cancer  .  Lung cancer Other     smoker  . Prostate cancer Other     MGMs 1/2 brother   ROS: All systems reviewed and negative except as per HPI.  Current Outpatient Prescriptions  Medication Sig Dispense Refill  . tamoxifen (NOLVADEX) 20 MG tablet TAKE 1 TABLET BY MOUTH ONCE DAILY 90 tablet 3  . venlafaxine (EFFEXOR) 75 MG tablet Take 75 mg by mouth daily.     No current facility-administered medications for this encounter.    BP 102/74   Pulse 67   Wt 181 lb 8 oz (82.3 kg)   SpO2 100%   BMI 25.31 kg/m - She can stop candesartan.  General: NAD Neck: No JVD, no thyromegaly or thyroid nodule.  Lungs:  Clear to auscultation bilaterally with normal respiratory effort. CV: Nondisplaced PMI.  Heart regular S1/S2, no S3/S4, no murmur.  No peripheral edema.  No carotid bruit.  Normal pedal pulses.  Abdomen: Soft, nontender, no hepatosplenomegaly, no distention.  Skin: Intact without lesions or rashes.  Neurologic: Alert and oriented x 3.  Psych: Normal affect. Extremities: No clubbing or cyanosis.  HEENT: Normal.   35 yo with history of breast cancer.  She had a transient fall in EF and strain with Herceptin.  She was started candesartan and Coreg, and Herceptin was held.  EF improved back to normal range.  She stopped Coreg and candesartan and returned for repeat echo today.  I reviewed today's echo, EF 55% with normal strain pattern.   She can be seen prn at this point.   Loralie Champagne  04/10/2016

## 2016-04-17 MED FILL — VENLAFAXINE HCL ER 75 MG CA: 75 | 30 days supply | Qty: 30 | Fill #1

## 2016-05-20 NOTE — Assessment & Plan Note (Signed)
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 Biopsy of the right breast mass and left axillary lymph node and the secondary mass left breast back as benign. Status post neo-adjuvant TCH Perjeta 6 cycles from 09/27/2014 to 01/10/2015 Followed by bilateral mastectomies on 02/10/2015: No malignancy in either breast Herceptin maintenance completed 09/21/2015 ----------------------------------------------------------------------------------------------------------------------------------------------------- Current treatment: Zoladex with tamoxifen started 02/22/2015 Stopped 11/17/2015 due to musculoskeletal aches and pains; Now on Tamoxifen alone restarted Mar 18, 2016  Tamoxifen toxicities: 1. Hot flashes improved with Effexor 2. Depression symptoms also improved with Effexor 3. Arthralgias and myalgias: Patient stopped tamoxifen June 2017   Weight issues: We discussed the role of intermittent fasting and provided her with resources. Patient did not benefit from [facility] treatment.  Palpable lumps: Fat necrosis Return to clinic in 6 months for follow-up.

## 2016-05-21 ENCOUNTER — Encounter: Payer: Self-pay | Admitting: *Deleted

## 2016-05-21 ENCOUNTER — Encounter: Payer: Self-pay | Admitting: Hematology and Oncology

## 2016-05-21 ENCOUNTER — Ambulatory Visit: Payer: Managed Care, Other (non HMO)

## 2016-05-21 ENCOUNTER — Ambulatory Visit (HOSPITAL_BASED_OUTPATIENT_CLINIC_OR_DEPARTMENT_OTHER): Payer: Managed Care, Other (non HMO) | Admitting: Hematology and Oncology

## 2016-05-21 VITALS — BP 119/65 | HR 80 | Temp 98.3°F | Resp 18 | Wt 178.3 lb

## 2016-05-21 DIAGNOSIS — G9332 Myalgic encephalomyelitis/chronic fatigue syndrome: Secondary | ICD-10-CM | POA: Insufficient documentation

## 2016-05-21 DIAGNOSIS — C50412 Malignant neoplasm of upper-outer quadrant of left female breast: Secondary | ICD-10-CM | POA: Diagnosis not present

## 2016-05-21 DIAGNOSIS — M791 Myalgia: Secondary | ICD-10-CM | POA: Diagnosis not present

## 2016-05-21 DIAGNOSIS — R5382 Chronic fatigue, unspecified: Secondary | ICD-10-CM | POA: Diagnosis not present

## 2016-05-21 DIAGNOSIS — M2550 Pain in unspecified joint: Secondary | ICD-10-CM

## 2016-05-21 DIAGNOSIS — F329 Major depressive disorder, single episode, unspecified: Secondary | ICD-10-CM

## 2016-05-21 DIAGNOSIS — Z170 Estrogen receptor positive status [ER+]: Secondary | ICD-10-CM | POA: Diagnosis not present

## 2016-05-21 MED ORDER — CYANOCOBALAMIN 1000 MCG/ML IJ SOLN
1000.0000 ug | Freq: Once | INTRAMUSCULAR | Status: AC
  Administered 2016-05-21: 1000 ug via INTRAMUSCULAR

## 2016-05-21 MED ORDER — CYANOCOBALAMIN 1000 MCG/ML IJ SOLN
INTRAMUSCULAR | Status: AC
  Filled 2016-05-21: qty 1

## 2016-05-21 NOTE — Patient Instructions (Signed)
Cyanocobalamin, Vitamin B12 injection What is this medicine? CYANOCOBALAMIN (sye an oh koe BAL a min) is a man made form of vitamin B12. Vitamin B12 is used in the growth of healthy blood cells, nerve cells, and proteins in the body. It also helps with the metabolism of fats and carbohydrates. This medicine is used to treat people who can not absorb vitamin B12. This medicine may be used for other purposes; ask your health care provider or pharmacist if you have questions. COMMON BRAND NAME(S): B-12 Compliance Kit, B-12 Injection Kit, Cyomin, LA-12, Nutri-Twelve, Physicians EZ Use B-12, Primabalt What should I tell my health care provider before I take this medicine? They need to know if you have any of these conditions: -kidney disease -Leber's disease -megaloblastic anemia -an unusual or allergic reaction to cyanocobalamin, cobalt, other medicines, foods, dyes, or preservatives -pregnant or trying to get pregnant -breast-feeding How should I use this medicine? This medicine is injected into a muscle or deeply under the skin. It is usually given by a health care professional in a clinic or doctor's office. However, your doctor may teach you how to inject yourself. Follow all instructions. Talk to your pediatrician regarding the use of this medicine in children. Special care may be needed. Overdosage: If you think you have taken too much of this medicine contact a poison control center or emergency room at once. NOTE: This medicine is only for you. Do not share this medicine with others. What if I miss a dose? If you are given your dose at a clinic or doctor's office, call to reschedule your appointment. If you give your own injections and you miss a dose, take it as soon as you can. If it is almost time for your next dose, take only that dose. Do not take double or extra doses. What may interact with this medicine? -colchicine -heavy alcohol intake This list may not describe all possible  interactions. Give your health care provider a list of all the medicines, herbs, non-prescription drugs, or dietary supplements you use. Also tell them if you smoke, drink alcohol, or use illegal drugs. Some items may interact with your medicine. What should I watch for while using this medicine? Visit your doctor or health care professional regularly. You may need blood work done while you are taking this medicine. You may need to follow a special diet. Talk to your doctor. Limit your alcohol intake and avoid smoking to get the best benefit. What side effects may I notice from receiving this medicine? Side effects that you should report to your doctor or health care professional as soon as possible: -allergic reactions like skin rash, itching or hives, swelling of the face, lips, or tongue -blue tint to skin -chest tightness, pain -difficulty breathing, wheezing -dizziness -red, swollen painful area on the leg Side effects that usually do not require medical attention (report to your doctor or health care professional if they continue or are bothersome): -diarrhea -headache This list may not describe all possible side effects. Call your doctor for medical advice about side effects. You may report side effects to FDA at 1-800-FDA-1088. Where should I keep my medicine? Keep out of the reach of children. Store at room temperature between 15 and 30 degrees C (59 and 85 degrees F). Protect from light. Throw away any unused medicine after the expiration date. NOTE: This sheet is a summary. It may not cover all possible information. If you have questions about this medicine, talk to your doctor, pharmacist, or   health care provider.  2017 Elsevier/Gold Standard (2007-09-15 22:10:20)  

## 2016-05-21 NOTE — Progress Notes (Signed)
Pt received first dose b12 shot. R Deltoid. Printed AVS for pt instructions on possible side effects and when to call for assistance. Pt verbalized understanding. Sent pre auth msg to care management.

## 2016-05-21 NOTE — Progress Notes (Signed)
Patient Care Team: Loraine Leriche, MD as PCP - General (Internal Medicine) Nicholas Lose, MD as Consulting Physician (Hematology and Oncology) Rolm Bookbinder, MD as Consulting Physician (General Surgery) Irene Limbo, MD as Consulting Physician (Plastic Surgery) Rockwell Germany, RN as Registered Nurse Mauro Kaufmann, RN as Registered Nurse Sylvan Cheese, NP as Nurse Practitioner (Hematology and Oncology) Paula Compton, MD as Consulting Physician (Obstetrics and Gynecology)  DIAGNOSIS:  Encounter Diagnoses  Name Primary?  . Malignant neoplasm of upper-outer quadrant of left breast in female, estrogen receptor positive ([place]) Yes  . Chronic fatigue syndrome     SUMMARY OF ONCOLOGIC HISTORY:   Breast cancer of upper-outer quadrant of left female breast ([place])   08/13/2014 Mammogram    Left breast: 2 cm circumscribed mass      08/13/2014 Breast US    Left breast: two masses: #1: 1:00 10 x 9 x 13 mmm irregular; #2: 2:00: 16 x 7 x 14 mm; no LAD      08/27/2014 Initial Diagnosis    O/S excisional biopsy: 2 masses showing 1.5 cm and 0.9 cm IDC, Grade 3, ER + (90%), PR- (0%), HER-2 positive (ratio 2.5), Ki67 ~30%, Multifocal      08/27/2014 Clinical Stage    Stage IIA/IIB: T2/T3 N0      09/17/2014 Procedure    MyRisk panel ([facility]) 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 Otherwise negative at APC, ATM, BARD1, BMPR1A, BRCA1, BRCA2, BRIP1, CHD1, CDK4, CDKN2A, CHEK2, MLH1, MSH2, MSH6, MUTYH, NBN, PALB2, PMS2, PTEN, RAD51C, RAD51D, SMAD4, STK11, and TP53      09/20/2014 Breast MRI    RIGHT: 1 x 0.8 x 0.9 cm lobulated border mass in the retroareolar lower slight lateral area. LEFT: hematoma with surrounding adjacent enhancement encompassing a 4.6 x 4.9 x 4.1 cm area.       09/27/2014 - 01/10/2015 Neo-Adjuvant Chemotherapy    Neoadjuvant TCH Perjeta every 3 week 6 followed by Herceptin maintenance completed 09/21/2015      01/13/2015 Breast MRI    Postsurgical  changes in left breast without residual enhancing masses compatible with treatment response      02/10/2015 Definitive Surgery    Bilateral mastectomies: RIGHT: benign.  LEFT: complete path response;  0/3 sentinel nodes      02/10/2015 Pathologic Stage    ypT0 ypN0 ypM0      02/22/2015 -  Anti-estrogen oral therapy    Zoladex with tamoxifen      11/17/2015 Survivorship    Survivorship care plan completed and given to patient       CHIEF COMPLIANT: Follow-up on tamoxifen therapy  INTERVAL HISTORY: KALAYAH LESKE is a 35 year old with above-mentioned history of right breast cancer treated with neoadjuvant chemotherapy followed by bilateral mastectomies with reconstruction. She had a complete pathologic response. Initially we treated her with Zoladex and tamoxifen. She could not tolerate the combination very well. She had diffuse muscle aches and pains and she was having depression symptoms. We discontinued the Zoladex and resumed tamoxifen alone. She appears to be doing quite a bit better on tamoxifen alone. She does continue to have muscle aches and pains. The hot flashes have resolved since we stopped Zoladex injections. Her biggest complaint today is lack of energy. She is feeling fatigued and tired most days and has been not able to enjoy the time with her daughter. She is unable to exercise as often as she would like. She continues to struggle with not able to lose weight.  REVIEW OF SYSTEMS:  Constitutional: Denies fevers, chills or abnormal weight loss Eyes: Denies blurriness of vision Ears, nose, mouth, throat, and face: Denies mucositis or sore throat Respiratory: Denies cough, dyspnea or wheezes Cardiovascular: Denies palpitation, chest discomfort Gastrointestinal:  Denies nausea, heartburn or change in bowel habits Skin: Denies abnormal skin rashes Lymphatics: Denies new lymphadenopathy or easy bruising Neurological:Denies numbness, tingling or new  weaknesses Behavioral/Psych: Mood is stable, no new changes  Extremities: No lower extremity edema Breast:  denies any pain or lumps or nodules in either reconstructed breasts All other systems were reviewed with the patient and are negative.  I have reviewed the past medical history, past surgical history, social history and family history with the patient and they are unchanged from previous note.  ALLERGIES:  is allergic to buprenorphine hcl; morphine and related; other; penicillins; and tegaderm ag mesh [silver].  MEDICATIONS:  Current Outpatient Prescriptions  Medication Sig Dispense Refill  . tamoxifen (NOLVADEX) 20 MG tablet TAKE 1 TABLET BY MOUTH ONCE DAILY 90 tablet 3  . venlafaxine (EFFEXOR) 75 MG tablet Take 75 mg by mouth daily.     Current Facility-Administered Medications  Medication Dose Route Frequency Provider Last Rate Last Dose  . cyanocobalamin ((VITAMIN B-12)) injection 1,000 mcg  1,000 mcg Intramuscular Once Nicholas Lose, MD        PHYSICAL EXAMINATION: ECOG PERFORMANCE STATUS: 1 - Symptomatic but completely ambulatory  Vitals:   05/21/16 0840  BP: 119/65  Pulse: 80  Resp: 18  Temp: 98.3 F (36.8 C)   Filed Weights   05/21/16 0840  Weight: 178 lb 4.8 oz (80.9 kg)    GENERAL:alert, no distress and comfortable SKIN: skin color, texture, turgor are normal, no rashes or significant lesions EYES: normal, Conjunctiva are pink and non-injected, sclera clear OROPHARYNX:no exudate, no erythema and lips, buccal mucosa, and tongue normal  NECK: supple, thyroid normal size, non-tender, without nodularity LYMPH:  no palpable lymphadenopathy in the cervical, axillary or inguinal LUNGS: clear to auscultation and percussion with normal breathing effort HEART: regular rate & rhythm and no murmurs and no lower extremity edema ABDOMEN:abdomen soft, non-tender and normal bowel sounds MUSCULOSKELETAL:no cyanosis of digits and no clubbing  NEURO: alert & oriented x 3  with fluent speech, no focal motor/sensory deficits EXTREMITIES: No lower extremity edema  LABORATORY DATA:  I have reviewed the data as listed   Chemistry      Component Value Date/Time   NA 140 09/21/2015 1340   K 4.0 09/21/2015 1340   CL 99 (L) 02/27/2015 1135   CO2 27 09/21/2015 1340   BUN 19.6 09/21/2015 1340   CREATININE 0.8 09/21/2015 1340      Component Value Date/Time   CALCIUM 9.3 09/21/2015 1340   ALKPHOS 57 09/21/2015 1340   AST 25 09/21/2015 1340   ALT 15 09/21/2015 1340   BILITOT <0.30 09/21/2015 1340       Lab Results  Component Value Date   WBC 4.9 09/21/2015   HGB 11.4 (L) 09/21/2015   HCT 34.6 (L) 09/21/2015   MCV 90.8 09/21/2015   PLT 180 09/21/2015   NEUTROABS 2.8 09/21/2015    ASSESSMENT & PLAN:  Breast cancer of upper-outer quadrant of left female breast 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 Biopsy of the right breast mass and left axillary lymph node and the secondary mass left breast back as benign. Status post neo-adjuvant TCH Perjeta 6 cycles from 09/27/2014 to 01/10/2015 Followed by bilateral mastectomies on 02/10/2015: No malignancy in either breast Herceptin maintenance completed 09/21/2015 ----------------------------------------------------------------------------------------------------------------------------------------------------- Current treatment: Zoladex with tamoxifen started 02/22/2015 Stopped 11/17/2015 due to musculoskeletal aches and pains; Now on Tamoxifen alone restarted Mar 18, 2016  Tamoxifen toxicities: 1. Hot flashes resolved after we stopped Zoladex 2. Depression symptoms also improved with Effexor:  Patient was able to taper and discontinue Effexor but she continues to have mild to moderate depression symptoms. 3. Arthralgias and myalgias: Patient stopped tamoxifen June 2017, restarted 03/18/2016. When she stop tamoxifen her myalgias have resolved. We will switch her to 10 mg of tamoxifen daily from 05/21/2016.   Weight issues:Patient did not benefit from Contrave treatment. Chronic fatigue syndrome: I recommended that we give B-12 injections once a month starting today.If she gets good benefit then we can prescribe her the injections that she can do at home.  Palpable lumps: Fat necrosis Return to clinic in 6 months for follow-up.  No orders of the defined types were placed in this encounter.  The patient has a good understanding of the overall plan. she agrees with it. she will call with any problems that may develop before the next visit here.   Rulon Eisenmenger, MD 05/21/16

## 2016-05-30 ENCOUNTER — Ambulatory Visit (INDEPENDENT_AMBULATORY_CARE_PROVIDER_SITE_OTHER): Payer: Managed Care, Other (non HMO) | Admitting: Internal Medicine

## 2016-05-30 ENCOUNTER — Other Ambulatory Visit (INDEPENDENT_AMBULATORY_CARE_PROVIDER_SITE_OTHER): Payer: Managed Care, Other (non HMO)

## 2016-05-30 ENCOUNTER — Encounter: Payer: Self-pay | Admitting: Internal Medicine

## 2016-05-30 VITALS — BP 126/86 | HR 66 | Temp 98.1°F | Resp 16 | Ht 71.0 in | Wt 183.0 lb

## 2016-05-30 DIAGNOSIS — C50412 Malignant neoplasm of upper-outer quadrant of left female breast: Secondary | ICD-10-CM | POA: Diagnosis not present

## 2016-05-30 DIAGNOSIS — R5383 Other fatigue: Secondary | ICD-10-CM

## 2016-05-30 DIAGNOSIS — R5382 Chronic fatigue, unspecified: Secondary | ICD-10-CM | POA: Diagnosis not present

## 2016-05-30 DIAGNOSIS — Z170 Estrogen receptor positive status [ER+]: Secondary | ICD-10-CM

## 2016-05-30 DIAGNOSIS — I428 Other cardiomyopathies: Secondary | ICD-10-CM | POA: Diagnosis not present

## 2016-05-30 DIAGNOSIS — G9332 Myalgic encephalomyelitis/chronic fatigue syndrome: Secondary | ICD-10-CM

## 2016-05-30 LAB — COMPREHENSIVE METABOLIC PANEL
ALBUMIN: 4.1 g/dL (ref 3.5–5.2)
ALK PHOS: 64 U/L (ref 39–117)
ALT: 10 U/L (ref 0–35)
AST: 18 U/L (ref 0–37)
BILIRUBIN TOTAL: 0.2 mg/dL (ref 0.2–1.2)
BUN: 17 mg/dL (ref 6–23)
CALCIUM: 9.2 mg/dL (ref 8.4–10.5)
CO2: 27 meq/L (ref 19–32)
CREATININE: 0.68 mg/dL (ref 0.40–1.20)
Chloride: 107 mEq/L (ref 96–112)
GFR: 103.99 mL/min (ref >60.00)
Glucose, Bld: 88 mg/dL (ref 70–99)
Potassium: 4.6 mEq/L (ref 3.5–5.1)
Sodium: 140 mEq/L (ref 135–145)
TOTAL PROTEIN: 6.9 g/dL (ref 6.0–8.3)

## 2016-05-30 LAB — CBC WITH DIFFERENTIAL/PLATELET
BASOS ABS: 0.1 10*3/uL (ref 0.0–0.1)
Basophils Relative: 0.8 % (ref 0.0–3.0)
EOS ABS: 0.1 10*3/uL (ref 0.0–0.7)
Eosinophils Relative: 1.2 % (ref 0.0–5.0)
HEMATOCRIT: 35.9 % — AB (ref 36.0–46.0)
HEMOGLOBIN: 11.9 g/dL — AB (ref 12.0–15.0)
LYMPHS PCT: 25.4 % (ref 12.0–46.0)
Lymphs Abs: 1.6 10*3/uL (ref 0.7–4.0)
MCHC: 33.2 g/dL (ref 30.0–36.0)
MCV: 88.3 fl (ref 78.0–100.0)
MONOS PCT: 7.5 % (ref 3.0–12.0)
Monocytes Absolute: 0.5 10*3/uL (ref 0.1–1.0)
NEUTROS ABS: 4.2 10*3/uL (ref 1.4–7.7)
Neutrophils Relative %: 65.1 % (ref 43.0–77.0)
Platelets: 200 10*3/uL (ref 150.0–400.0)
RBC: 4.07 Mil/uL (ref 3.87–5.11)
RDW: 13.5 % (ref 11.5–15.5)
WBC: 6.4 10*3/uL (ref 4.0–10.5)

## 2016-05-30 LAB — VITAMIN B12: VITAMIN B 12: 1068 pg/mL — AB (ref 211–911)

## 2016-05-30 LAB — IRON: IRON: 36 ug/dL — AB (ref 42–145)

## 2016-05-30 LAB — TSH: TSH: 0.87 u[IU]/mL (ref 0.35–4.50)

## 2016-05-30 LAB — VITAMIN D 25 HYDROXY (VIT D DEFICIENCY, FRACTURES): VITD: 27.76 ng/mL — ABNORMAL LOW (ref 30.00–100.00)

## 2016-05-30 LAB — FERRITIN: Ferritin: 12.1 ng/mL (ref 10.0–291.0)

## 2016-05-30 NOTE — Progress Notes (Signed)
Subjective:    Patient ID: Natasha Torres, female    DOB: 02/05/1981, 35 y.o.   MRN: [id number]  HPI She is here to establish with a new pcp.    Breast cancer:  Diagnosed in early 2016.  She had a lumpectomy.  She had chemotherapy and immunotherapy.  She then had a mastectomy and LN were neg.  She is taking  - she took a three month break from the medication due to joint and spine pain.  She is in the process of recontruction.  She is still dealing with significant fatigue.  She received a B12 injection last week and it did not help much. She has to drink caffeine to stay awake.  She sleeps a good amount and has good quality sleep.  She does not feel refreshed after sleeping.  She is a single mom. She has a 55 year old girl.  Her mom lives in [place] and helps some.  Her husband has her child sometimes.  It is hard to focus, feels disorganized, difficulty with focus/attention, it is hard to get to work on times frequently.  It is hard to organize and execute her plan.    She was effexor for hot flashes for several months.  The effexor did not improve the energy level or foggy brain feeling.   She would like to exercise.  She is trying to eat more vegetarian.     Medications and allergies reviewed with patient and updated if appropriate.  Patient Active Problem List   Diagnosis Date Noted  . Chronic fatigue syndrome 05/21/2016  . Lymphedema 04/04/2015  . Acquired absence of breast and nipple 02/27/2015  . S/P bilateral mastectomy 02/10/2015  . Nonischemic cardiomyopathy ([place]) 01/18/2015  . Esophagitis 10/25/2014  . Pharyngitis 10/15/2014  . Constipation 10/15/2014  . 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 Breast cancer of upper-outer quadrant of left female breast ([place]) 09/14/2014  . SVD (spontaneous vaginal delivery) 12/03/2011    Current Outpatient Prescriptions on File Prior to Visit  Medication Sig Dispense Refill  . tamoxifen (NOLVADEX) 20 MG tablet  TAKE 1 TABLET BY MOUTH ONCE DAILY (Patient taking differently: TAKE 1/2 TABLET BY MOUTH ONCE DAILY) 90 tablet 3   No current facility-administered medications on file prior to visit.     Past Medical History:  Diagnosis Date  . Anxiety   . Family history of adverse reaction to anesthesia    pt's mother has hx. of post-op N/V  . History of breast cancer 2016  . Sore in mouth 09/15/2015    Past Surgical History:  Procedure Laterality Date  . BREAST LUMPECTOMY Left 08/2014  . BREAST RECONSTRUCTION WITH PLACEMENT OF TISSUE EXPANDER AND FLEX HD (ACELLULAR HYDRATED DERMIS) Bilateral 02/10/2015   Procedure: BREAST RECONSTRUCTION WITH PLACEMENT OF TISSUE EXPANDER AND FLEX HD (ACELLULAR HYDRATED DERMIS);  Surgeon: Irene Limbo, MD;  Location: [facility];  Service: Plastics;  Laterality: Bilateral;  . INCISION AND DRAINAGE OF WOUND Left 03/02/2015   Procedure: IRRIGATION BREAST POCKET AND EXCHANGE OF LEFT BREAST TISSUE EXPANDER;  Surgeon: Irene Limbo, MD;  Location: [facility];  Service: Plastics;  Laterality: Left;  . LIPOSUCTION WITH LIPOFILLING Bilateral 06/07/2015   Procedure: LIPOSUCTION WITH LIPOFILLING TO BILATERAL CHEST;  Surgeon: Irene Limbo, MD;  Location: [facility];  Service: Plastics;  Laterality: Bilateral;  . LIPOSUCTION WITH LIPOFILLING Bilateral 09/23/2015   Procedure: LIPOFILLING FROM BILATERAL THIGHS TO BILATERAL CHEST ;  Surgeon: Irene Limbo, MD;  Location: [facility];  Service: Plastics;  Laterality: Bilateral;  . MASTECTOMY Bilateral 02/10/2015  . NIPPLE SPARING MASTECTOMY/SENTINAL LYMPH NODE BIOPSY/RECONSTRUCTION/PLACEMENT OF TISSUE EXPANDER Bilateral 02/10/2015   Procedure: BILATERAL  NIPPLE SPARING MASTECTOMY WITH LEFT  SENTINAL LYMPH NODE BIOPSY(RIGHT BREAST PROPHYLACTIC);  Surgeon: Emelia Loron, MD;  [facility];  Service: General;  Laterality: Bilateral;  . ORIF FINGER / THUMB FRACTURE Right 1994   thumb  . PORT-A-CATH REMOVAL   01/07/2015   removed and replaced  . PORT-A-CATH REMOVAL Right 09/23/2015   Procedure: REMOVAL PORT-A-CATH;  Surgeon: Glenna Fellows, MD;  Location: [facility];  Service: Plastics;  Laterality: Right;  . PORTACATH PLACEMENT  08/2014; 01/07/2015  . REMOVAL OF BILATERAL TISSUE EXPANDERS WITH PLACEMENT OF BILATERAL BREAST IMPLANTS Bilateral 06/07/2015   Procedure: REMOVAL OF BILATERAL TISSUE EXPANDERS WITH PLACEMENT OF BILATERAL BREAST  SILICONE IMPLANTS;  Surgeon: Glenna Fellows, MD;  Location: [facility];  Service: Plastics;  Laterality: Bilateral;  . REMOVAL OF TISSUE EXPANDER AND PLACEMENT OF IMPLANT Right 02/27/2015   Procedure: REMOVAL OF TISSUE EXPANDER AND PLACEMENT OF NEW TISSUE EXPANDER;  Surgeon: Glenna Fellows, MD;  Location: [facility] OR;  Service: Plastics;  Laterality: Right;  . SALPINGOOPHORECTOMY Right 11/22/2010  . TISSUE EXPANDER PLACEMENT Left 03/02/2015   Procedure: TISSUE EXPANDER;  Surgeon: Glenna Fellows, MD;  Location: [facility] OR;  Service: Plastics;  Laterality: Left;  . WISDOM TOOTH EXTRACTION  2001    Social History   Social History  . Marital status: Married    Spouse name: Cristal Deer  . Number of children: 1  . Years of education: N/A   Social History Main Topics  . Smoking status: Never Smoker  . Smokeless tobacco: Never Used  . Alcohol use 0.0 oz/week     Comment: 1-3 drinks 2-3 x/week  . Drug use: No  . Sexual activity: Not Currently   Other Topics Concern  . None   Social History Narrative   ** Merged History Encounter **        Family History  Problem Relation Age of Onset  . Hypertension Mother   . Prostate cancer Father 25  . Depression Maternal Aunt   . Dementia Maternal Grandmother   . Cancer Maternal Grandfather   . Heart attack Maternal Grandfather   . Dementia Paternal Grandmother   . Kidney cancer Paternal Grandmother     slow growing, no treatment  . Cancer Paternal Grandfather   . Prostate cancer Paternal  Grandfather 32  . Bone cancer Paternal Grandfather 49  . Breast cancer Paternal Grandfather 73  . Lung cancer Paternal Grandfather     dx late 83s; smoker.  thought to be a 4th primary cancer  . Lung cancer Other     smoker  . Prostate cancer Other     MGMs 1/2 brother    Review of Systems  Constitutional: Positive for fatigue. Negative for appetite change, chills and fever.  Eyes: Negative for visual disturbance.  Respiratory: Negative for cough, shortness of breath and wheezing.   Cardiovascular: Negative for chest pain, palpitations and leg swelling.  Gastrointestinal: Negative for abdominal pain, blood in stool, constipation, diarrhea and nausea.       Rare gerd  Endocrine: Negative for polyuria.  Genitourinary: Negative for dysuria and hematuria.  Musculoskeletal: Positive for arthralgias (fingers - - tamoxifen related) and back pain (spine - tamoxifen related).  Skin: Negative for color change and rash.  Neurological: Negative for dizziness, light-headedness, numbness and headaches.  Psychiatric/Behavioral:  Positive for dysphoric mood (mild, at times). Negative for sleep disturbance (sleeps 7 hrs, good quality, not refreshing). The patient is nervous/anxious (at times, mild).        Objective:   Vitals:   05/30/16 0907  BP: 126/86  Pulse: 66  Resp: 16  Temp: 98.1 F (36.7 C)   Filed Weights   05/30/16 0907  Weight: 183 lb (83 kg)   Body mass index is 25.52 kg/m.   Physical Exam Constitutional: She appears well-developed and well-nourished. No distress.  HENT:  Head: Normocephalic and atraumatic.  Right Ear: External ear normal. Normal ear canal and TM Left Ear: External ear normal.  Normal ear canal and TM Mouth/Throat: Oropharynx is clear and moist.  Eyes: Conjunctivae and EOM are normal.  Neck: Neck supple. No tracheal deviation present. No thyromegaly present.  No carotid bruit  Cardiovascular: Normal rate, regular rhythm and normal heart sounds.   No  murmur heard.  No edema. Pulmonary/Chest: Effort normal and breath sounds normal. No respiratory distress. She has no wheezes. She has no rales.  Abdominal: Soft. She exhibits no distension. There is no tenderness.  Lymphadenopathy: She has no cervical adenopathy.  Skin: Skin is warm and dry. She is not diaphoretic.  Psychiatric: She has a normal mood and affect. Her behavior is normal.        Assessment & Plan:    See Problem List for Assessment and Plan of chronic medical problems.

## 2016-05-30 NOTE — Patient Instructions (Signed)
  Test(s) ordered today. Your results will be released to MyChart (or called to you) after review, usually within 72hours after test completion. If any changes need to be made, you will be notified at that same time.  All other Health Maintenance issues reviewed.   All recommended immunizations and age-appropriate screenings are up-to-date or discussed.  No immunizations administered today.   Medications reviewed and updated.  No changes recommended at this time.     

## 2016-05-30 NOTE — Assessment & Plan Note (Signed)
Transient with chemo - last few echos have been normal.

## 2016-05-30 NOTE — Assessment & Plan Note (Signed)
Since having chemotherapy Associated with foggy brain, difficulty focusing/concentrating She wonders about something like Adderall Denies clinically important depression and anxiety Check labs Will consider medication after blood work

## 2016-05-30 NOTE — Progress Notes (Signed)
Pre visit review using our clinic review tool, if applicable. No additional management support is needed unless otherwise documented below in the visit note. 

## 2016-06-03 ENCOUNTER — Encounter: Payer: Self-pay | Admitting: Internal Medicine

## 2016-06-05 ENCOUNTER — Other Ambulatory Visit: Payer: Self-pay | Admitting: Internal Medicine

## 2016-06-05 DIAGNOSIS — G9332 Myalgic encephalomyelitis/chronic fatigue syndrome: Secondary | ICD-10-CM

## 2016-06-05 DIAGNOSIS — E559 Vitamin D deficiency, unspecified: Secondary | ICD-10-CM | POA: Insufficient documentation

## 2016-06-05 DIAGNOSIS — R5382 Chronic fatigue, unspecified: Secondary | ICD-10-CM

## 2016-06-05 DIAGNOSIS — D509 Iron deficiency anemia, unspecified: Secondary | ICD-10-CM | POA: Insufficient documentation

## 2016-06-07 DIAGNOSIS — L818 Other specified disorders of pigmentation: Secondary | ICD-10-CM

## 2016-06-07 HISTORY — DX: Other specified disorders of pigmentation: L81.8

## 2016-06-08 ENCOUNTER — Encounter (HOSPITAL_BASED_OUTPATIENT_CLINIC_OR_DEPARTMENT_OTHER): Payer: Self-pay | Admitting: *Deleted

## 2016-06-08 NOTE — Pre-Procedure Instructions (Signed)
To come pick up Boost Breeze 8 oz. - to drink by 0500 DOS

## 2016-06-13 NOTE — Progress Notes (Signed)
Pt given Boost Drink and instructed to drink before 5 am day of surgery with teach back method.

## 2016-06-15 ENCOUNTER — Encounter (HOSPITAL_BASED_OUTPATIENT_CLINIC_OR_DEPARTMENT_OTHER): Payer: Self-pay

## 2016-06-15 ENCOUNTER — Ambulatory Visit (HOSPITAL_BASED_OUTPATIENT_CLINIC_OR_DEPARTMENT_OTHER)
Admission: RE | Admit: 2016-06-15 | Discharge: 2016-06-15 | Disposition: A | Discharge status: Home / Self Care | Payer: Managed Care, Other (non HMO) | Source: Ambulatory Visit | Attending: Plastic Surgery | Admitting: Plastic Surgery

## 2016-06-15 ENCOUNTER — Ambulatory Visit (HOSPITAL_BASED_OUTPATIENT_CLINIC_OR_DEPARTMENT_OTHER): Payer: Managed Care, Other (non HMO) | Admitting: Anesthesiology

## 2016-06-15 ENCOUNTER — Encounter (HOSPITAL_BASED_OUTPATIENT_CLINIC_OR_DEPARTMENT_OTHER)
Admission: RE | Disposition: A | Discharge status: Home / Self Care | Payer: Self-pay | Source: Ambulatory Visit | Attending: Plastic Surgery

## 2016-06-15 DIAGNOSIS — Z853 Personal history of malignant neoplasm of breast: Secondary | ICD-10-CM | POA: Diagnosis not present

## 2016-06-15 DIAGNOSIS — Z888 Allergy status to other drugs, medicaments and biological substances status: Secondary | ICD-10-CM | POA: Insufficient documentation

## 2016-06-15 DIAGNOSIS — Z9221 Personal history of antineoplastic chemotherapy: Secondary | ICD-10-CM | POA: Diagnosis not present

## 2016-06-15 DIAGNOSIS — Z885 Allergy status to narcotic agent status: Secondary | ICD-10-CM | POA: Diagnosis not present

## 2016-06-15 DIAGNOSIS — D649 Anemia, unspecified: Secondary | ICD-10-CM | POA: Insufficient documentation

## 2016-06-15 DIAGNOSIS — Z9013 Acquired absence of bilateral breasts and nipples: Secondary | ICD-10-CM | POA: Diagnosis not present

## 2016-06-15 DIAGNOSIS — Z421 Encounter for breast reconstruction following mastectomy: Secondary | ICD-10-CM | POA: Insufficient documentation

## 2016-06-15 HISTORY — DX: Anemia, unspecified: D64.9

## 2016-06-15 HISTORY — DX: Other specified disorders of pigmentation: L81.8

## 2016-06-15 HISTORY — PX: LIPOSUCTION WITH LIPOFILLING: SHX6436

## 2016-06-15 LAB — POCT HEMOGLOBIN-HEMACUE: Hemoglobin: 10.3 g/dL — ABNORMAL LOW (ref 12.0–15.0)

## 2016-06-15 SURGERY — LIPOSUCTION, WITH FAT TRANSFER
Anesthesia: General | Site: Chest | Laterality: Bilateral

## 2016-06-15 MED ORDER — LIDOCAINE 2% (20 MG/ML) 5 ML SYRINGE
INTRAMUSCULAR | Status: DC | PRN
  Administered 2016-06-15: 100 mg via INTRAVENOUS

## 2016-06-15 MED ORDER — CHLORHEXIDINE GLUCONATE CLOTH 2 % EX PADS: 6.0000 | MEDICATED_PAD | Freq: Once | CUTANEOUS | Status: DC

## 2016-06-15 MED ORDER — MIDAZOLAM HCL 2 MG/2ML IJ SOLN
1.0000 mg | INTRAMUSCULAR | Status: DC | PRN
  Administered 2016-06-15: 2 mg via INTRAVENOUS

## 2016-06-15 MED ORDER — CIPROFLOXACIN IN D5W 400 MG/200ML IV SOLN: 400.0000 mg | INTRAVENOUS | Status: DC

## 2016-06-15 MED ORDER — ONDANSETRON HCL 4 MG/2ML IJ SOLN
INTRAMUSCULAR | Status: AC
  Filled 2016-06-15: qty 2

## 2016-06-15 MED ORDER — HYDROMORPHONE HCL 1 MG/ML IJ SOLN
INTRAMUSCULAR | Status: AC
  Filled 2016-06-15: qty 1

## 2016-06-15 MED ORDER — OXYCODONE HCL 5 MG PO TABS: 5.0000 mg | ORAL_TABLET | ORAL | 0 refills | Status: DC | PRN

## 2016-06-15 MED ORDER — CELECOXIB 400 MG PO CAPS
400.0000 mg | ORAL_CAPSULE | ORAL | Status: AC
  Administered 2016-06-15: 400 mg via ORAL

## 2016-06-15 MED ORDER — SCOPOLAMINE 1 MG/3DAYS TD PT72: 1.0000 | MEDICATED_PATCH | Freq: Once | TRANSDERMAL | Status: DC | PRN

## 2016-06-15 MED ORDER — FENTANYL CITRATE (PF) 100 MCG/2ML IJ SOLN
INTRAMUSCULAR | Status: AC
  Filled 2016-06-15: qty 2

## 2016-06-15 MED ORDER — PROPOFOL 500 MG/50ML IV EMUL
INTRAVENOUS | Status: AC
  Filled 2016-06-15: qty 50

## 2016-06-15 MED ORDER — ACETAMINOPHEN 500 MG PO TABS
1000.0000 mg | ORAL_TABLET | ORAL | Status: AC
  Administered 2016-06-15: 1000 mg via ORAL

## 2016-06-15 MED ORDER — GABAPENTIN 300 MG PO CAPS: 300.0000 mg | ORAL_CAPSULE | ORAL | Status: DC

## 2016-06-15 MED ORDER — VANCOMYCIN HCL 10 G IV SOLR
1500.0000 mg | INTRAVENOUS | Status: AC
  Administered 2016-06-15: 1500 mg via INTRAVENOUS

## 2016-06-15 MED ORDER — KETOROLAC TROMETHAMINE 30 MG/ML IJ SOLN
INTRAMUSCULAR | Status: AC
  Filled 2016-06-15: qty 1

## 2016-06-15 MED ORDER — DEXAMETHASONE SODIUM PHOSPHATE 4 MG/ML IJ SOLN
INTRAMUSCULAR | Status: DC | PRN
  Administered 2016-06-15: 10 mg via INTRAVENOUS

## 2016-06-15 MED ORDER — KETOROLAC TROMETHAMINE 30 MG/ML IJ SOLN
INTRAMUSCULAR | Status: DC | PRN
Start: 2016-06-15 — End: 2016-06-15
  Administered 2016-06-15: 30 mg via INTRAVENOUS

## 2016-06-15 MED ORDER — ACETAMINOPHEN 500 MG PO TABS
ORAL_TABLET | ORAL | Status: AC
  Filled 2016-06-15: qty 2

## 2016-06-15 MED ORDER — ACETAMINOPHEN 500 MG PO TABS: 1000.0000 mg | ORAL_TABLET | ORAL | Status: DC

## 2016-06-15 MED ORDER — PROPOFOL 10 MG/ML IV BOLUS
INTRAVENOUS | Status: DC | PRN
  Administered 2016-06-15: 200 mg via INTRAVENOUS

## 2016-06-15 MED ORDER — LACTATED RINGERS IV SOLN
INTRAVENOUS | Status: DC
  Administered 2016-06-15: 08:00:00 via INTRAVENOUS

## 2016-06-15 MED ORDER — MIDAZOLAM HCL 2 MG/2ML IJ SOLN
INTRAMUSCULAR | Status: AC
  Filled 2016-06-15: qty 2

## 2016-06-15 MED ORDER — VANCOMYCIN HCL IN DEXTROSE 1-5 GM/200ML-% IV SOLN
INTRAVENOUS | Status: AC
  Filled 2016-06-15: qty 200

## 2016-06-15 MED ORDER — GABAPENTIN 300 MG PO CAPS
300.0000 mg | ORAL_CAPSULE | ORAL | Status: AC
  Administered 2016-06-15: 300 mg via ORAL

## 2016-06-15 MED ORDER — VANCOMYCIN HCL IN DEXTROSE 500-5 MG/100ML-% IV SOLN
INTRAVENOUS | Status: AC
  Filled 2016-06-15: qty 100

## 2016-06-15 MED ORDER — GABAPENTIN 300 MG PO CAPS
ORAL_CAPSULE | ORAL | Status: AC
  Filled 2016-06-15: qty 1

## 2016-06-15 MED ORDER — CELECOXIB 200 MG PO CAPS
ORAL_CAPSULE | ORAL | Status: AC
  Filled 2016-06-15: qty 2

## 2016-06-15 MED ORDER — HYDROMORPHONE HCL 1 MG/ML IJ SOLN
0.2500 mg | INTRAMUSCULAR | Status: DC | PRN
  Administered 2016-06-15 (×4): 0.5 mg via INTRAVENOUS

## 2016-06-15 MED ORDER — DEXAMETHASONE SODIUM PHOSPHATE 10 MG/ML IJ SOLN
INTRAMUSCULAR | Status: AC
  Filled 2016-06-15: qty 1

## 2016-06-15 MED ORDER — CELECOXIB 400 MG PO CAPS: 400.0000 mg | ORAL_CAPSULE | ORAL | Status: DC

## 2016-06-15 MED ORDER — FENTANYL CITRATE (PF) 100 MCG/2ML IJ SOLN
50.0000 ug | INTRAMUSCULAR | Status: AC | PRN
  Administered 2016-06-15: 25 ug via INTRAVENOUS
  Administered 2016-06-15: 50 ug via INTRAVENOUS
  Administered 2016-06-15: 25 ug via INTRAVENOUS
  Administered 2016-06-15: 100 ug via INTRAVENOUS

## 2016-06-15 MED ORDER — LACTATED RINGERS IV SOLN
INTRAVENOUS | Status: DC | PRN
  Administered 2016-06-15: 1061 mL via INTRAMUSCULAR

## 2016-06-15 MED ORDER — KETOROLAC TROMETHAMINE 30 MG/ML IJ SOLN: 30.0000 mg | Freq: Once | INTRAMUSCULAR | Status: DC | PRN

## 2016-06-15 MED ORDER — PROMETHAZINE HCL 25 MG/ML IJ SOLN: 6.2500 mg | INTRAMUSCULAR | Status: DC | PRN

## 2016-06-15 MED ORDER — ONDANSETRON HCL 4 MG/2ML IJ SOLN
INTRAMUSCULAR | Status: DC | PRN
  Administered 2016-06-15: 4 mg via INTRAVENOUS

## 2016-06-15 SURGICAL SUPPLY — 57 items
BINDER ABDOMINAL 10 UNV 27-48 (MISCELLANEOUS) IMPLANT
BINDER ABDOMINAL 12 SM 30-45 (SOFTGOODS) IMPLANT
BINDER BREAST LRG (GAUZE/BANDAGES/DRESSINGS) IMPLANT
BINDER BREAST MEDIUM (GAUZE/BANDAGES/DRESSINGS) IMPLANT
BINDER BREAST XLRG (GAUZE/BANDAGES/DRESSINGS) IMPLANT
BINDER BREAST XXLRG (GAUZE/BANDAGES/DRESSINGS) IMPLANT
BLADE SURG 10 STRL SS (BLADE) ×2 IMPLANT
BLADE SURG 11 STRL SS (BLADE) ×2 IMPLANT
BNDG GAUZE ELAST 4 BULKY (GAUZE/BANDAGES/DRESSINGS) ×4 IMPLANT
CANISTER LIPO FAT HARVEST (MISCELLANEOUS) ×2 IMPLANT
CANISTER SUCT 1200ML W/VALVE (MISCELLANEOUS) IMPLANT
CHLORAPREP W/TINT 26ML (MISCELLANEOUS) ×2 IMPLANT
COMPRESSION GARMENT LG MOREWEL (MISCELLANEOUS) IMPLANT
COMPRESSION GARMENT MD MOREWEL (MISCELLANEOUS) IMPLANT
COMPRESSION GARMENT XL MOREWEL (MISCELLANEOUS) IMPLANT
COVER BACK TABLE 60X90IN (DRAPES) ×2 IMPLANT
COVER MAYO STAND STRL (DRAPES) ×2 IMPLANT
DECANTER SPIKE VIAL GLASS SM (MISCELLANEOUS) IMPLANT
DRAPE TOP ARMCOVERS (MISCELLANEOUS) ×2 IMPLANT
DRAPE U-SHAPE 76X120 STRL (DRAPES) ×2 IMPLANT
DRSG PAD ABDOMINAL 8X10 ST (GAUZE/BANDAGES/DRESSINGS) ×4 IMPLANT
ELECT COATED BLADE 2.86 ST (ELECTRODE) IMPLANT
ELECT REM PT RETURN 9FT ADLT (ELECTROSURGICAL) ×2
ELECTRODE REM PT RTRN 9FT ADLT (ELECTROSURGICAL) ×1 IMPLANT
FILTER LIPOSUCTION (MISCELLANEOUS) ×2 IMPLANT
GLOVE BIO SURGEON STRL SZ 6 (GLOVE) ×2 IMPLANT
GOWN STRL REUS W/ TWL LRG LVL3 (GOWN DISPOSABLE) ×2 IMPLANT
GOWN STRL REUS W/TWL LRG LVL3 (GOWN DISPOSABLE) ×4
LINER CANISTER 1000CC FLEX (MISCELLANEOUS) ×2 IMPLANT
LIQUID BAND (GAUZE/BANDAGES/DRESSINGS) ×4 IMPLANT
NDL SAFETY ECLIPSE 18X1.5 (NEEDLE) ×1 IMPLANT
NEEDLE HYPO 18GX1.5 SHARP (NEEDLE) ×2
NS IRRIG 1000ML POUR BTL (IV SOLUTION) ×2 IMPLANT
PACK BASIN DAY SURGERY FS (CUSTOM PROCEDURE TRAY) ×2 IMPLANT
PAD ALCOHOL SWAB (MISCELLANEOUS) ×2 IMPLANT
PENCIL BUTTON HOLSTER BLD 10FT (ELECTRODE) IMPLANT
SHEET MEDIUM DRAPE 40X70 STRL (DRAPES) ×4 IMPLANT
SLEEVE SCD COMPRESS KNEE MED (MISCELLANEOUS) ×2 IMPLANT
SPONGE LAP 18X18 X RAY DECT (DISPOSABLE) ×2 IMPLANT
STAPLER VISISTAT 35W (STAPLE) ×2 IMPLANT
SUT MNCRL AB 4-0 PS2 18 (SUTURE) ×2 IMPLANT
SUT VIC AB 3-0 PS1 18 (SUTURE)
SUT VIC AB 3-0 PS1 18XBRD (SUTURE) IMPLANT
SUT VIC AB 3-0 SH 27 (SUTURE)
SUT VIC AB 3-0 SH 27X BRD (SUTURE) IMPLANT
SUT VICRYL 4-0 PS2 18IN ABS (SUTURE) IMPLANT
SYR 10ML LL (SYRINGE) ×6 IMPLANT
SYR 50ML LL SCALE MARK (SYRINGE) ×8 IMPLANT
SYR BULB IRRIGATION 50ML (SYRINGE) IMPLANT
SYR CONTROL 10ML LL (SYRINGE) IMPLANT
SYR TB 1ML LL NO SAFETY (SYRINGE) ×2 IMPLANT
TOWEL OR 17X24 6PK STRL BLUE (TOWEL DISPOSABLE) ×4 IMPLANT
TUBE CONNECTING 20X1/4 (TUBING) IMPLANT
TUBING INFILTRATION IT-10001 (TUBING) ×2 IMPLANT
TUBING SET GRADUATE ASPIR 12FT (MISCELLANEOUS) ×2 IMPLANT
UNDERPAD 30X30 (UNDERPADS AND DIAPERS) ×4 IMPLANT
YANKAUER SUCT BULB TIP NO VENT (SUCTIONS) IMPLANT

## 2016-06-15 NOTE — Discharge Instructions (Signed)

## 2016-06-15 NOTE — Anesthesia Postprocedure Evaluation (Signed)
Anesthesia Post Note  Patient: ALGENE HOLLANDER  Procedure(s) Performed: Procedure(s) (LRB): LIPOFILLING FROM BILATERAL THIGH TO BILATERAL CHEST (Bilateral)  Patient location during evaluation: PACU Anesthesia Type: General Level of consciousness: awake and alert Pain management: pain level controlled Vital Signs Assessment: post-procedure vital signs reviewed and stable Respiratory status: spontaneous breathing, nonlabored ventilation, respiratory function stable and patient connected to nasal cannula oxygen Cardiovascular status: blood pressure returned to baseline and stable Postop Assessment: no signs of nausea or vomiting Anesthetic complications: no       Last Vitals:  Vitals:   06/15/16 1100 06/15/16 1115  BP: (!) 92/57 108/62  Pulse: 66 (!) 57  Resp: 17 14  Temp:      Last Pain:  Vitals:   06/15/16 1115  TempSrc:   PainSc: 3                  Tiajuana Amass

## 2016-06-15 NOTE — Transfer of Care (Signed)
Immediate Anesthesia Transfer of Care Note  Patient: Natasha Torres  Procedure(s) Performed: Procedure(s) with comments: LIPOFILLING FROM BILATERAL THIGH TO BILATERAL CHEST (Bilateral) - LIPOFILLING FROM BILATERAL THIGH TO BILATERAL CHEST  Patient Location: PACU  Anesthesia Type:General  Level of Consciousness: awake and sedated  Airway & Oxygen Therapy: Patient Spontanous Breathing and Patient connected to face mask oxygen  Post-op Assessment: Report given to RN and Post -op Vital signs reviewed and stable  Post vital signs: Reviewed and stable  Last Vitals:  Vitals:   06/15/16 0950 06/15/16 0951  BP: 95/61   Pulse: 70 66  Resp:  18  Temp:      Last Pain:  Vitals:   06/15/16 0747  TempSrc: Oral         Complications: No apparent anesthesia complications

## 2016-06-15 NOTE — Op Note (Signed)
Operative Note   DATE OF OPERATION: 12.29.17  LOCATION: [facility]-outpatient  SURGICAL DIVISION: Plastic Surgery  PREOPERATIVE DIAGNOSES:  1. History breast cancer 2. Acquired absence breasts  POSTOPERATIVE DIAGNOSES:  same  PROCEDURE:  Lipofilling from bilateral lateral thighs to bilateral chest  SURGEON: Irene Limbo MD MBA  ASSISTANT: none  ANESTHESIA:  General.   EBL: 10 ml  COMPLICATIONS: None immediate.   INDICATIONS FOR PROCEDURE:  The patient, Natasha Torres, is a 35 y.o. female born on November 24, 1980, is here for repeat lipofilling to chest to address contour reconstructed breasts.   FINDINGS: Total 90 ml fat infiltrated over left chest and 80 ml over right.  DESCRIPTION OF PROCEDURE:  The patient was marked in standing position to mark lateral thigh donor sites as well as areas of contour depression and animation deformity over superior poles, axillae, and lateral chest wall. The patient was taken to the operating room. SCDs were placed and IV antibiotics were given. The patient's operative site was prepped and draped in a sterile fashion. A time out was performed and all information was confirmed to be correct.   Stab incisions made over superior thigh and anterior thigh and tumescent infiltrated over lateral thighs, total 400 ml infiltrated. Power assisted liposuction completed to endpoint symmetric contour and soft tissue thickness, total aspirate 400 ml. Fat prepared by washing and gravity. Stab incisions made over upper outer chest, chest wall and medial chest. Fat infiltrated in subcutaneous plane over superior poles, lateral chest wall, and axillae. Incisions over thighs and chest closed with interrupted 4-0 monocryl. Dry dressing applied over incision and compression garment applied to thighs, breast binder to chest.   The patient was allowed to wake from anesthesia, extubated and taken to the recovery room in satisfactory condition.   SPECIMENS:  none  DRAINS: none  Irene Limbo, MD [facility] Plastic & Reconstructive Surgery [phone], pin [phone]

## 2016-06-15 NOTE — Anesthesia Procedure Notes (Signed)
Procedure Name: LMA Insertion Date/Time: 06/15/2016 8:32 AM Performed by: Terrance Mass Pre-anesthesia Checklist: Patient identified, Emergency Drugs available, Suction available and Patient being monitored Patient Re-evaluated:Patient Re-evaluated prior to inductionOxygen Delivery Method: Circle system utilized Preoxygenation: Pre-oxygenation with 100% oxygen Intubation Type: IV induction Ventilation: Mask ventilation without difficulty LMA: LMA inserted LMA Size: 4.0 Number of attempts: 1 Airway Equipment and Method: Bite block Placement Confirmation: positive ETCO2 Tube secured with: Tape Dental Injury: Teeth and Oropharynx as per pre-operative assessment

## 2016-06-15 NOTE — H&P (Signed)
Subjective:     Patient ID: Natasha Torres is a 35 y.o. female.  Follow-up   1 year post implant exchange following NSM and immediate expander based reconstruction.   Presented with palpable mass on left. MMG and US revealed 2 masses in breast.  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 Genetics with ATM mutation. Biopsy of the right breast mass and left axillary lymph node and the secondary mass left breast was benign. Completed neoadjuvant chemotherapy. Final pathology with no residual cancer, LN negative. Herceptin completed 4.17.      Objective:   Physical Exam  Cardiovascular: Normal rate, regular rhythm and normal heart sounds.   Pulmonary/Chest: Effort normal and breath sounds normal.  Abdominal: Soft.   Chest :  bilateral animation deformity, visible rippling noted lateral breasts when leaning forward, residual depressed contour superior chest  Assessment:     Left breast cancer, ATM mutation  Neoadjuvant chemotherapy S/p bilateral NSM, TE/ADM reconstruction S/p removal/replacement bilateral TE, removal ADM for cellulitis Recurrent seroma right, MSSA S/p placement silicone implants, lipofilling  S/p removal port, lipofilling    Plan:     Plan additional lipofilling from lateral thighs to breasts. Reviewed risks fat necrosis, variable take graft, need for compression donor site 6 weeks, donor site contour irregularites.   Mentor Siltex Ultra High Profile 430 ml implants placed bilateral, 110 ml fat infiltrated over right reconstruction, 80 ml over left.  Lipofilling # 2 :130 ml fat infiltrated over right chest, 80 ml over left chest.   Irene Limbo, MD [facility] Plastic [facility] Reconstruct[facility] Surgery [phone], pin [phone]

## 2016-06-15 NOTE — Anesthesia Preprocedure Evaluation (Signed)
Anesthesia Evaluation  Patient identified by MRN, date of birth, ID band Patient awake    Reviewed: Allergy & Precautions, NPO status , Patient's Chart, lab work & pertinent test results  Airway Mallampati: II  TM Distance: >3 FB Neck ROM: Full    Dental  (+) Dental Advisory Given   Pulmonary neg pulmonary ROS,    breath sounds clear to auscultation       Cardiovascular negative cardio ROS   Rhythm:Regular Rate:Normal     Neuro/Psych negative neurological ROS     GI/Hepatic negative GI ROS, Neg liver ROS,   Endo/Other  negative endocrine ROS  Renal/GU negative Renal ROS     Musculoskeletal   Abdominal   Peds  Hematology  (+) anemia ,   Anesthesia Other Findings   Reproductive/Obstetrics                             Anesthesia Physical Anesthesia Plan  ASA: I  Anesthesia Plan: General   Post-op Pain Management:    Induction: Intravenous  Airway Management Planned: LMA and Oral ETT  Additional Equipment:   Intra-op Plan:   Post-operative Plan: Extubation in OR  Informed Consent: I have reviewed the patients History and Physical, chart, labs and discussed the procedure including the risks, benefits and alternatives for the proposed anesthesia with the patient or authorized representative who has indicated his/her understanding and acceptance.   Dental advisory given  Plan Discussed with: CRNA  Anesthesia Plan Comments:         Anesthesia Quick Evaluation

## 2016-06-16 ENCOUNTER — Encounter: Payer: Self-pay | Admitting: Internal Medicine

## 2016-06-21 ENCOUNTER — Ambulatory Visit: Payer: Managed Care, Other (non HMO)

## 2016-06-21 ENCOUNTER — Other Ambulatory Visit: Payer: Self-pay | Admitting: Emergency Medicine

## 2016-06-21 MED ORDER — CYANOCOBALAMIN 1000 MCG/ML IJ SOLN: 1000.0000 ug | Freq: Once | INTRAMUSCULAR | 3 refills | Status: AC

## 2016-06-21 MED FILL — CYANOCOBALAMIN 1,000 MCG/ML: 1000 | 84 days supply | Qty: 3 | Fill #0

## 2016-06-21 NOTE — Telephone Encounter (Signed)
Patient called requesting to cancel injection appointment for 06/21/16 and all future injection appointments. Patient requesting rx for B12 injections for her to administer at home. RX sent to [facility] outpatient pharmacy. Patient aware and instructed to call with any concerns.

## 2016-07-21 ENCOUNTER — Encounter: Payer: Self-pay | Admitting: Internal Medicine

## 2016-07-21 DIAGNOSIS — D509 Iron deficiency anemia, unspecified: Secondary | ICD-10-CM

## 2016-07-21 DIAGNOSIS — E559 Vitamin D deficiency, unspecified: Secondary | ICD-10-CM

## 2016-07-23 ENCOUNTER — Ambulatory Visit: Payer: Managed Care, Other (non HMO)

## 2016-07-23 ENCOUNTER — Other Ambulatory Visit (INDEPENDENT_AMBULATORY_CARE_PROVIDER_SITE_OTHER): Payer: 59

## 2016-07-23 DIAGNOSIS — E559 Vitamin D deficiency, unspecified: Secondary | ICD-10-CM

## 2016-07-23 DIAGNOSIS — D509 Iron deficiency anemia, unspecified: Secondary | ICD-10-CM

## 2016-07-23 LAB — CBC WITH DIFFERENTIAL/PLATELET
Basophils Absolute: 0.1 10*3/uL (ref 0.0–0.1)
Basophils Relative: 0.8 % (ref 0.0–3.0)
Eosinophils Absolute: 0 10*3/uL (ref 0.0–0.7)
Eosinophils Relative: 0.5 % (ref 0.0–5.0)
HCT: 37.8 % (ref 36.0–46.0)
HEMOGLOBIN: 12.5 g/dL (ref 12.0–15.0)
Lymphocytes Relative: 23.4 % (ref 12.0–46.0)
Lymphs Abs: 1.9 10*3/uL (ref 0.7–4.0)
MCHC: 33.1 g/dL (ref 30.0–36.0)
MCV: 90.6 fl (ref 78.0–100.0)
MONOS PCT: 6 % (ref 3.0–12.0)
Monocytes Absolute: 0.5 10*3/uL (ref 0.1–1.0)
Neutro Abs: 5.5 10*3/uL (ref 1.4–7.7)
Neutrophils Relative %: 69.3 % (ref 43.0–77.0)
Platelets: 204 10*3/uL (ref 150.0–400.0)
RBC: 4.17 Mil/uL (ref 3.87–5.11)
RDW: 13.1 % (ref 11.5–15.5)
WBC: 8 10*3/uL (ref 4.0–10.5)

## 2016-07-23 LAB — IRON: Iron: 48 ug/dL (ref 42–145)

## 2016-07-23 LAB — VITAMIN D 25 HYDROXY (VIT D DEFICIENCY, FRACTURES): VITD: 30.05 ng/mL (ref 30.00–100.00)

## 2016-07-23 LAB — FERRITIN: Ferritin: 19 ng/mL (ref 10.0–291.0)

## 2016-07-25 ENCOUNTER — Ambulatory Visit (INDEPENDENT_AMBULATORY_CARE_PROVIDER_SITE_OTHER): Payer: 59 | Admitting: Internal Medicine

## 2016-07-25 ENCOUNTER — Encounter: Payer: Self-pay | Admitting: Internal Medicine

## 2016-07-25 VITALS — BP 124/82 | HR 85 | Temp 98.5°F | Resp 16 | Wt 188.0 lb

## 2016-07-25 DIAGNOSIS — F988 Other specified behavioral and emotional disorders with onset usually occurring in childhood and adolescence: Secondary | ICD-10-CM

## 2016-07-25 DIAGNOSIS — E559 Vitamin D deficiency, unspecified: Secondary | ICD-10-CM | POA: Diagnosis not present

## 2016-07-25 DIAGNOSIS — D509 Iron deficiency anemia, unspecified: Secondary | ICD-10-CM

## 2016-07-25 MED ORDER — AMPHETAMINE-DEXTROAMPHET ER 20 MG PO CP24: 20.0000 mg | ORAL_CAPSULE | Freq: Every day | ORAL | 0 refills | Status: DC

## 2016-07-25 MED FILL — DEXTROAMP-AMPHET ER 20 MG C: 20 | 30 days supply | Qty: 30 | Fill #0

## 2016-07-25 NOTE — Assessment & Plan Note (Signed)
Likely secondary to chemo  Having difficulty concentrating/focusing and she is forgetting things  She is having difficulty with time management  She denies depression and anxiety Will try Adderall XR 20 mg daily Follow up in 3 weeks Discussed possible side effects, possible addiction potential

## 2016-07-25 NOTE — Assessment & Plan Note (Signed)
No longer anemic, iron levels better - still on low side Energy level better Continue daily iron

## 2016-07-25 NOTE — Assessment & Plan Note (Signed)
Vitamin d level better Increase vitamin d to 2000 unit daily

## 2016-07-25 NOTE — Progress Notes (Signed)
Subjective:    Patient ID: Natasha Torres, female    DOB: 09/09/1980, 36 y.o.   MRN: [id number]  HPI The patient is here for follow up.  Vitamin D def:  She is taking vitamin D daily.   She is taking iron daily.  Her energy level is better.   Her energy level is better.  The beginning of hte week is better.  By [place] Patter she starts to feel more fatigued.  Work is stressful.  Work is English as a second language teacher.  She still has forgetfulness and distraction.  She forgets things all the time.  She feels distracted driving at times.  She feels it is chemo related and possibly tamoxifen related.  Prior to this she felt she was very good at time management and multitasking.  She ahs to write everything down.  She has to concentrate more to get things done.   She denies depression.   She denies anxiety.   Medications and allergies reviewed with patient and updated if appropriate.  Patient Active Problem List   Diagnosis Date Noted  . Iron deficiency anemia 06/05/2016  . Vitamin D deficiency 06/05/2016  . Chronic fatigue syndrome 05/21/2016  . Lymphedema 04/04/2015  . Acquired absence of breast and nipple 02/27/2015  . S/P bilateral mastectomy 02/10/2015  . Nonischemic cardiomyopathy ([place]) 01/18/2015  . Esophagitis 10/25/2014  . Breast cancer associated with mutation in ATM gene ([place])   . Breast cancer of upper-outer quadrant of left female breast ([place]) 09/14/2014    Current Outpatient Prescriptions on File Prior to Visit  Medication Sig Dispense Refill  . cholecalciferol (VITAMIN D) 1000 units tablet Take 2,000 Units by mouth daily.    . ferrous sulfate 325 (65 FE) MG tablet Take 325 mg by mouth daily with breakfast.    . Multiple Vitamin (MULTIVITAMIN) tablet Take 1 tablet by mouth daily.    . tamoxifen (NOLVADEX) 10 MG tablet Take 10 mg by mouth daily.     No current facility-administered medications on file prior to visit.     Past Medical History:  Diagnosis Date  . Anemia    takes  iron supplement  . Family history of adverse reaction to anesthesia    pt's mother has hx. of post-op N/V  . History of breast cancer 2016  . Tattoo of skin 06/07/2016   new tattoo right ankle    Past Surgical History:  Procedure Laterality Date  . BREAST LUMPECTOMY Left 08/2014  . BREAST RECONSTRUCTION WITH PLACEMENT OF TISSUE EXPANDER AND FLEX HD (ACELLULAR HYDRATED DERMIS) Bilateral 02/10/2015   Procedure: BREAST RECONSTRUCTION WITH PLACEMENT OF TISSUE EXPANDER AND FLEX HD (ACELLULAR HYDRATED DERMIS);  Surgeon: Irene Limbo, MD;  Location: [place];  Service: Plastics;  Laterality: Bilateral;  . INCISION AND DRAINAGE OF WOUND Left 03/02/2015   Procedure: IRRIGATION BREAST POCKET AND EXCHANGE OF LEFT BREAST TISSUE EXPANDER;  Surgeon: Irene Limbo, MD;  Location: [place];  Service: Plastics;  Laterality: Left;  . LIPOSUCTION WITH LIPOFILLING Bilateral 06/07/2015   Procedure: LIPOSUCTION WITH LIPOFILLING TO BILATERAL CHEST;  Surgeon: Irene Limbo, MD;  Location: [place];  Service: Plastics;  Laterality: Bilateral;  . LIPOSUCTION WITH LIPOFILLING Bilateral 09/23/2015   Procedure: LIPOFILLING FROM BILATERAL THIGHS TO BILATERAL CHEST ;  Surgeon: Irene Limbo, MD;  Location: [place];  Service: Plastics;  Laterality: Bilateral;  . LIPOSUCTION WITH LIPOFILLING Bilateral 06/15/2016   Procedure: LIPOFILLING FROM BILATERAL THIGH TO BILATERAL CHEST;  Surgeon: Irene Limbo, MD;  Location: [place]  [place];  Service: Plastics;  Laterality: Bilateral;  LIPOFILLING FROM BILATERAL THIGH TO BILATERAL CHEST  . MASTECTOMY Bilateral 02/10/2015  . NIPPLE SPARING MASTECTOMY/SENTINAL LYMPH NODE BIOPSY/RECONSTRUCTION/PLACEMENT OF TISSUE EXPANDER Bilateral 02/10/2015   Procedure: BILATERAL  NIPPLE SPARING MASTECTOMY WITH LEFT  SENTINAL LYMPH NODE BIOPSY(RIGHT BREAST PROPHYLACTIC);  Surgeon: Rolm Bookbinder, MD;  Location: [place];  Service: General;  Laterality:  Bilateral;  . ORIF FINGER / THUMB FRACTURE Right 1994   thumb  . PORT-A-CATH REMOVAL  01/07/2015   removed and replaced  . PORT-A-CATH REMOVAL Right 09/23/2015   Procedure: REMOVAL PORT-A-CATH;  Surgeon: Irene Limbo, MD;  Location: [place];  Service: Plastics;  Laterality: Right;  . PORTACATH PLACEMENT  08/2014; 01/07/2015  . REMOVAL OF BILATERAL TISSUE EXPANDERS WITH PLACEMENT OF BILATERAL BREAST IMPLANTS Bilateral 06/07/2015   Procedure: REMOVAL OF BILATERAL TISSUE EXPANDERS WITH PLACEMENT OF BILATERAL BREAST  SILICONE IMPLANTS;  Surgeon: Irene Limbo, MD;  Location: [place];  Service: Plastics;  Laterality: Bilateral;  . REMOVAL OF TISSUE EXPANDER AND PLACEMENT OF IMPLANT Right 02/27/2015   Procedure: REMOVAL OF TISSUE EXPANDER AND PLACEMENT OF NEW TISSUE EXPANDER;  Surgeon: Irene Limbo, MD;  Location: [place];  Service: Plastics;  Laterality: Right;  . SALPINGOOPHORECTOMY Right 11/22/2010  . TISSUE EXPANDER PLACEMENT Left 03/02/2015   Procedure: TISSUE EXPANDER;  Surgeon: Irene Limbo, MD;  Location: [place];  Service: Plastics;  Laterality: Left;  . WISDOM TOOTH EXTRACTION  2001    Social History   Social History  . Marital status: Married    Spouse name: Harrell Gave  . Number of children: 1  . Years of education: N/A   Social History Main Topics  . Smoking status: Never Smoker  . Smokeless tobacco: Never Used  . Alcohol use 0.0 oz/week     Comment: 1-3 drinks 2-3 x/week  . Drug use: No  . Sexual activity: Not Currently   Other Topics Concern  . Not on file   Social History Narrative   ** Merged History Encounter **        Family History  Problem Relation Age of Onset  . Hypertension Mother   . Anesthesia problems Mother     post-op N/V  . Prostate cancer Father 44  . Depression Maternal Aunt   . Dementia Maternal Grandmother   . Cancer Maternal Grandfather   . Heart attack Maternal Grandfather   . Dementia Paternal  Grandmother   . Kidney cancer Paternal Grandmother     slow growing, no treatment  . Cancer Paternal Grandfather   . Prostate cancer Paternal Grandfather 76  . Bone cancer Paternal Grandfather 39  . Breast cancer Paternal Grandfather 13  . Lung cancer Paternal Grandfather     dx late 56s; smoker.  thought to be a 4th primary cancer    Review of Systems  Constitutional: Positive for fatigue (improved). Negative for appetite change.  Respiratory: Negative for shortness of breath.   Cardiovascular: Negative for chest pain and palpitations.  Neurological: Negative for headaches.  Psychiatric/Behavioral: Positive for decreased concentration. Negative for dysphoric mood and sleep disturbance. The patient is not nervous/anxious.        Objective:   Vitals:   07/25/16 1110  BP: 124/82  Pulse: 85  Resp: 16  Temp: 98.5 F (36.9 C)   Wt Readings from Last 3 Encounters:  07/25/16 188 lb (85.3 kg)  06/15/16 185 lb 12.8 oz (84.3 kg)  05/30/16 183 lb (83 kg)   Body mass  index is 26.22 kg/m.   Physical Exam  Constitutional: She is oriented to person, place, and time. She appears well-developed and well-nourished. No distress.  Neurological: She is alert and oriented to person, place, and time.  Skin: Skin is warm and dry. She is not diaphoretic.  Psychiatric: She has a normal mood and affect. Her behavior is normal. Judgment and thought content normal.           Assessment & Plan:    See Problem List for Assessment and Plan of chronic medical problems.   FU in 3 weeks

## 2016-07-25 NOTE — Patient Instructions (Addendum)
Increase your vitamin d to 2000 units daily.  Start the Adderall in the morning.     Follow up in 3 weeks

## 2016-08-16 ENCOUNTER — Ambulatory Visit (INDEPENDENT_AMBULATORY_CARE_PROVIDER_SITE_OTHER): Payer: 59 | Admitting: Internal Medicine

## 2016-08-16 ENCOUNTER — Encounter: Payer: Self-pay | Admitting: Internal Medicine

## 2016-08-16 VITALS — BP 134/76 | HR 99 | Temp 98.0°F | Resp 16 | Wt 181.0 lb

## 2016-08-16 DIAGNOSIS — F988 Other specified behavioral and emotional disorders with onset usually occurring in childhood and adolescence: Secondary | ICD-10-CM

## 2016-08-16 MED ORDER — AMPHETAMINE-DEXTROAMPHETAMINE 10 MG PO TABS: 10.0000 mg | ORAL_TABLET | Freq: Two times a day (BID) | ORAL | 0 refills | Status: DC

## 2016-08-16 NOTE — Progress Notes (Signed)
Subjective:    Patient ID: Natasha Torres, female    DOB: [birth year], 36 y.o.   MRN: [id number]  HPI The patient is here for follow up.  ADD (difficulty concentrating/focusing, Easily distracted) , poor energy: We started her on Adderall XR last month and she is here for follow-up.  She has more energy and her concentration/focus is much better.  Initially, she was too tired in the evening, but that has improved over the past week.  Overall, she is happy with the medication.    She has had some mild side effects.  Her appetite is decreased.  It has reduced her cravings - sugar/carbs, alcohol.  She has had mild insomnia at times.  She has had a little constipation.    The medication costs too much and the pharmacist recommended the short acting medication.  She is happy with her current medication, but needs to change due to cost.   Medications and allergies reviewed with patient and updated if appropriate.  Patient Active Problem List   Diagnosis Date Noted  . Attention deficit disorder 07/25/2016  . Iron deficiency anemia 06/05/2016  . Vitamin D deficiency 06/05/2016  . Chronic fatigue syndrome 05/21/2016  . Lymphedema 04/04/2015  . Acquired absence of breast and nipple 02/27/2015  . S/P bilateral mastectomy 02/10/2015  . Breast cancer associated with mutation in ATM gene ([place])   . Breast cancer of upper-outer quadrant of left female breast ([place]) 09/14/2014    Current Outpatient Prescriptions on File Prior to Visit  Medication Sig Dispense Refill  . amphetamine-dextroamphetamine (ADDERALL XR) 20 MG 24 hr capsule Take 1 capsule (20 mg total) by mouth daily. 30 capsule 0  . cholecalciferol (VITAMIN D) 1000 units tablet Take 2,000 Units by mouth daily.    . ferrous sulfate 325 (65 FE) MG tablet Take 325 mg by mouth daily with breakfast.    . Multiple Vitamin (MULTIVITAMIN) tablet Take 1 tablet by mouth daily.    . tamoxifen (NOLVADEX) 10 MG tablet Take 10 mg by mouth daily.      No current facility-administered medications on file prior to visit.     Past Medical History:  Diagnosis Date  . Anemia    takes iron supplement  . Family history of adverse reaction to anesthesia    pt's mother has hx. of post-op N/V  . History of breast cancer 2016  . Tattoo of skin 06/07/2016   new tattoo right ankle    Past Surgical History:  Procedure Laterality Date  . BREAST LUMPECTOMY Left 08/2014  . BREAST RECONSTRUCTION WITH PLACEMENT OF TISSUE EXPANDER AND FLEX HD (ACELLULAR HYDRATED DERMIS) Bilateral 02/10/2015   Procedure: BREAST RECONSTRUCTION WITH PLACEMENT OF TISSUE EXPANDER AND FLEX HD (ACELLULAR HYDRATED DERMIS);  Surgeon: Irene Limbo, MD;  Location: [place];  Service: Plastics;  Laterality: Bilateral;  . INCISION AND DRAINAGE OF WOUND Left 03/02/2015   Procedure: IRRIGATION BREAST POCKET AND EXCHANGE OF LEFT BREAST TISSUE EXPANDER;  Surgeon: Irene Limbo, MD;  Location: [place];  Service: Plastics;  Laterality: Left;  . LIPOSUCTION WITH LIPOFILLING Bilateral 06/07/2015   Procedure: LIPOSUCTION WITH LIPOFILLING TO BILATERAL CHEST;  Surgeon: Irene Limbo, MD;  Location: [place];  Service: Plastics;  Laterality: Bilateral;  . LIPOSUCTION WITH LIPOFILLING Bilateral 09/23/2015   Procedure: LIPOFILLING FROM BILATERAL THIGHS TO BILATERAL CHEST ;  Surgeon: Irene Limbo, MD;  Location: [place];  Service: Plastics;  Laterality: Bilateral;  . LIPOSUCTION WITH LIPOFILLING Bilateral 06/15/2016  Procedure: LIPOFILLING FROM BILATERAL THIGH TO BILATERAL CHEST;  Surgeon: Irene Limbo, MD;  Location: [place];  Service: Plastics;  Laterality: Bilateral;  LIPOFILLING FROM BILATERAL THIGH TO BILATERAL CHEST  . MASTECTOMY Bilateral 02/10/2015  . NIPPLE SPARING MASTECTOMY/SENTINAL LYMPH NODE BIOPSY/RECONSTRUCTION/PLACEMENT OF TISSUE EXPANDER Bilateral 02/10/2015   Procedure: BILATERAL  NIPPLE SPARING MASTECTOMY WITH LEFT   SENTINAL LYMPH NODE BIOPSY(RIGHT BREAST PROPHYLACTIC);  Surgeon: Rolm Bookbinder, MD;  Location: [place];  Service: General;  Laterality: Bilateral;  . ORIF FINGER / THUMB FRACTURE Right 1994   thumb  . PORT-A-CATH REMOVAL  01/07/2015   removed and replaced  . PORT-A-CATH REMOVAL Right 09/23/2015   Procedure: REMOVAL PORT-A-CATH;  Surgeon: Irene Limbo, MD;  Location: [place];  Service: Plastics;  Laterality: Right;  . PORTACATH PLACEMENT  08/2014; 01/07/2015  . REMOVAL OF BILATERAL TISSUE EXPANDERS WITH PLACEMENT OF BILATERAL BREAST IMPLANTS Bilateral 06/07/2015   Procedure: REMOVAL OF BILATERAL TISSUE EXPANDERS WITH PLACEMENT OF BILATERAL BREAST  SILICONE IMPLANTS;  Surgeon: Irene Limbo, MD;  Location: [place];  Service: Plastics;  Laterality: Bilateral;  . REMOVAL OF TISSUE EXPANDER AND PLACEMENT OF IMPLANT Right 02/27/2015   Procedure: REMOVAL OF TISSUE EXPANDER AND PLACEMENT OF NEW TISSUE EXPANDER;  Surgeon: Irene Limbo, MD;  Location: [place];  Service: Plastics;  Laterality: Right;  . SALPINGOOPHORECTOMY Right 11/22/2010  . TISSUE EXPANDER PLACEMENT Left 03/02/2015   Procedure: TISSUE EXPANDER;  Surgeon: Irene Limbo, MD;  Location: [place];  Service: Plastics;  Laterality: Left;  . WISDOM TOOTH EXTRACTION  2001    Social History   Social History  . Marital status: Married    Spouse name: Harrell Gave  . Number of children: 1  . Years of education: N/A   Social History Main Topics  . Smoking status: Never Smoker  . Smokeless tobacco: Never Used  . Alcohol use 0.0 oz/week     Comment: 1-3 drinks 2-3 x/week  . Drug use: No  . Sexual activity: Not Currently   Other Topics Concern  . None   Social History Narrative   ** Merged History Encounter **        Family History  Problem Relation Age of Onset  . Hypertension Mother   . Anesthesia problems Mother     post-op N/V  . Prostate cancer Father 83  . Depression Maternal Aunt     . Dementia Maternal Grandmother   . Cancer Maternal Grandfather   . Heart attack Maternal Grandfather   . Dementia Paternal Grandmother   . Kidney cancer Paternal Grandmother     slow growing, no treatment  . Cancer Paternal Grandfather   . Prostate cancer Paternal Grandfather 27  . Bone cancer Paternal Grandfather 36  . Breast cancer Paternal Grandfather 45  . Lung cancer Paternal Grandfather     dx late 59s; smoker.  thought to be a 4th primary cancer    Review of Systems  Constitutional: Positive for appetite change.  Respiratory: Negative for shortness of breath.   Cardiovascular: Negative for chest pain and palpitations.  Gastrointestinal: Positive for constipation. Negative for nausea.  Neurological: Positive for headaches (at beginning when starting medication - not now). Negative for light-headedness.       Objective:   Vitals:   08/16/16 0820  BP: 134/76  Pulse: 99  Resp: 16  Temp: 98 F (36.7 C)   Wt Readings from Last 3 Encounters:  08/16/16 181 lb (82.1 kg)  07/25/16 188 lb (85.3 kg)  06/15/16 185 lb 12.8 oz (84.3 kg)   Body mass index is 25.24 kg/m.   Physical Exam    Constitutional: Appears well-developed and well-nourished. No distress.  HENT:  Head: Normocephalic and atraumatic.  Neck: Neck supple. No tracheal deviation present. No thyromegaly present.  No cervical lymphadenopathy Cardiovascular: Normal rate, regular rhythm and normal heart sounds.   No murmur heard. No carotid bruit .  No edema Pulmonary/Chest: Effort normal and breath sounds normal. No respiratory distress. No has no wheezes. No rales.  Skin: Skin is warm and dry. Not diaphoretic.  Psychiatric: Normal mood and affect. Behavior is normal.      Assessment & Plan:    See Problem List for Assessment and Plan of chronic medical problems.

## 2016-08-16 NOTE — Patient Instructions (Addendum)
   Medications reviewed and updated.  Changes include stopping the long acting Adderall and trying the short acting Adderall.  Please followup in 6 months

## 2016-08-16 NOTE — Progress Notes (Signed)
Pre visit review using our clinic review tool, if applicable. No additional management support is needed unless otherwise documented below in the visit note. 

## 2016-08-17 NOTE — Assessment & Plan Note (Signed)
Symptoms improved with Adderall 20 mg XR - she has mild side effects - cost is too expensive Change to Adderall 10 mg twice daily as needed Continue supplements - I think they have helped Continue regular exercise May be able to come off medication after a few months F/u in 6 months, sooner if needed

## 2016-08-20 ENCOUNTER — Ambulatory Visit: Payer: Managed Care, Other (non HMO)

## 2016-08-22 ENCOUNTER — Telehealth: Payer: Self-pay | Admitting: Emergency Medicine

## 2016-08-22 MED FILL — DEXTROAMP-AMPHETAMIN 10 MG: 10 | 30 days supply | Qty: 60 | Fill #0

## 2016-08-22 NOTE — Telephone Encounter (Signed)
PA initiated for Adderall. Favorable outcome. Notified pharmacy.

## 2016-08-28 MED FILL — TAMOXIFEN 20 MG TABLET: 20 | 30 days supply | Qty: 30 | Fill #1

## 2016-09-20 ENCOUNTER — Ambulatory Visit: Payer: Managed Care, Other (non HMO)

## 2016-10-09 ENCOUNTER — Telehealth: Payer: Self-pay | Admitting: *Deleted

## 2016-10-09 MED ORDER — AMPHETAMINE-DEXTROAMPHETAMINE 10 MG PO TABS: 10.0000 mg | ORAL_TABLET | Freq: Two times a day (BID) | ORAL | 0 refills | Status: DC

## 2016-10-09 NOTE — Telephone Encounter (Signed)
[place] controlled substance database checked.  Ok to fill medication.  

## 2016-10-09 NOTE — Telephone Encounter (Signed)
Called pt no answer LMOM rx ready for pick-up.../lmb 

## 2016-10-09 NOTE — Telephone Encounter (Signed)
Rec'd call pt requesting refill on her adderrall...Johny Chess

## 2016-10-12 MED FILL — DEXTROAMP-AMPHETAMIN 10 MG: 10 | 30 days supply | Qty: 60 | Fill #0

## 2016-10-18 MED FILL — CYANOCOBALAMIN 1,000 MCG/ML: 1000 | 84 days supply | Qty: 3 | Fill #1

## 2016-10-19 ENCOUNTER — Ambulatory Visit: Payer: Managed Care, Other (non HMO)

## 2016-10-21 DIAGNOSIS — J020 Streptococcal pharyngitis: Secondary | ICD-10-CM | POA: Diagnosis not present

## 2016-10-22 ENCOUNTER — Encounter: Payer: Self-pay | Admitting: Internal Medicine

## 2016-11-19 ENCOUNTER — Other Ambulatory Visit: Payer: Self-pay | Admitting: Emergency Medicine

## 2016-11-19 DIAGNOSIS — C50919 Malignant neoplasm of unspecified site of unspecified female breast: Secondary | ICD-10-CM

## 2016-11-20 ENCOUNTER — Other Ambulatory Visit (HOSPITAL_BASED_OUTPATIENT_CLINIC_OR_DEPARTMENT_OTHER): Payer: 59

## 2016-11-20 ENCOUNTER — Ambulatory Visit (HOSPITAL_BASED_OUTPATIENT_CLINIC_OR_DEPARTMENT_OTHER): Payer: 59 | Admitting: Hematology and Oncology

## 2016-11-20 ENCOUNTER — Encounter: Payer: Self-pay | Admitting: Hematology and Oncology

## 2016-11-20 ENCOUNTER — Ambulatory Visit: Payer: Managed Care, Other (non HMO)

## 2016-11-20 DIAGNOSIS — C50412 Malignant neoplasm of upper-outer quadrant of left female breast: Secondary | ICD-10-CM | POA: Diagnosis not present

## 2016-11-20 DIAGNOSIS — R5383 Other fatigue: Secondary | ICD-10-CM

## 2016-11-20 DIAGNOSIS — F329 Major depressive disorder, single episode, unspecified: Secondary | ICD-10-CM | POA: Diagnosis not present

## 2016-11-20 DIAGNOSIS — F419 Anxiety disorder, unspecified: Secondary | ICD-10-CM | POA: Diagnosis not present

## 2016-11-20 DIAGNOSIS — Z170 Estrogen receptor positive status [ER+]: Secondary | ICD-10-CM | POA: Diagnosis not present

## 2016-11-20 DIAGNOSIS — C50919 Malignant neoplasm of unspecified site of unspecified female breast: Secondary | ICD-10-CM

## 2016-11-20 LAB — CBC WITH DIFFERENTIAL/PLATELET
BASO%: 0.9 % (ref 0.0–2.0)
Basophils Absolute: 0 10*3/uL (ref 0.0–0.1)
EOS%: 1.3 % (ref 0.0–7.0)
Eosinophils Absolute: 0.1 10*3/uL (ref 0.0–0.5)
HCT: 37.4 % (ref 34.8–46.6)
HGB: 12.4 g/dL (ref 11.6–15.9)
LYMPH%: 36.8 % (ref 14.0–49.7)
MCH: 30.3 pg (ref 25.1–34.0)
MCHC: 33.1 g/dL (ref 31.5–36.0)
MCV: 91.5 fL (ref 79.5–101.0)
MONO#: 0.5 10*3/uL (ref 0.1–0.9)
MONO%: 11.4 % (ref 0.0–14.0)
NEUT%: 49.6 % (ref 38.4–76.8)
NEUTROS ABS: 2.1 10*3/uL (ref 1.5–6.5)
Platelets: 172 10*3/uL (ref 145–400)
RBC: 4.09 10*6/uL (ref 3.70–5.45)
RDW: 13.1 % (ref 11.2–14.5)
WBC: 4.2 10*3/uL (ref 3.9–10.3)
lymph#: 1.6 10*3/uL (ref 0.9–3.3)

## 2016-11-20 LAB — COMPREHENSIVE METABOLIC PANEL
ALT: 15 U/L (ref 0–55)
ANION GAP: 7 meq/L (ref 3–11)
AST: 29 U/L (ref 5–34)
Albumin: 3.9 g/dL (ref 3.5–5.0)
Alkaline Phosphatase: 51 U/L (ref 40–150)
BUN: 11.8 mg/dL (ref 7.0–26.0)
CHLORIDE: 108 meq/L (ref 98–109)
CO2: 24 meq/L (ref 22–29)
CREATININE: 0.8 mg/dL (ref 0.6–1.1)
Calcium: 9.6 mg/dL (ref 8.4–10.4)
EGFR: >90 mL/min/{1.73_m2} (ref >90)
GLUCOSE: 84 mg/dL (ref 70–140)
Potassium: 4.3 mEq/L (ref 3.5–5.1)
Sodium: 140 mEq/L (ref 136–145)
Total Bilirubin: 0.44 mg/dL (ref 0.20–1.20)
Total Protein: 6.9 g/dL (ref 6.4–8.3)

## 2016-11-20 MED ORDER — LORAZEPAM 0.5 MG PO TABS: 0.5000 mg | ORAL_TABLET | Freq: Every day | ORAL | 1 refills | Status: DC

## 2016-11-20 MED ORDER — TAMOXIFEN CITRATE 10 MG PO TABS: 10.0000 mg | ORAL_TABLET | Freq: Every day | ORAL | 3 refills | Status: DC

## 2016-11-20 NOTE — Assessment & Plan Note (Signed)
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 Biopsy of the right breast mass and left axillary lymph node and the secondary mass left breast back as benign. Status post neo-adjuvant TCH Per[place]eta 6 cycles from 09/27/2014 to 01/10/2015 Followed by bilateral mastectomies on 02/10/2015: No malignancy in either breast Herceptin maintenance completed 09/21/2015 ----------------------------------------------------------------------------------------------------------------------------------------------------- Current treatment: Zoladex with tamoxifen started 02/22/2015 Stopped 11/17/2015 due to musculoskeletal aches and pains; Now on Tamoxifen alone restarted Mar 18, 2016  Tamoxifen toxicities: 1. Hot flashes resolved after we stopped Zoladex 2. Depression: mild to moderate depression  3. Arthralgias and myalgias: Patient stopped tamoxifen June 2017, restarted 03/18/2016. Currently on 10 mg of tamoxifen daily from 05/21/2016.   Weight issues:Patient did not benefit from Contrave treatment. Chronic fatigue syndrome: She received one dose of vitamin B-12 injection in December.  Palpable lumps: Fat necrosis Return to clinic in 1 year for follow-up.

## 2016-11-20 NOTE — Progress Notes (Signed)
Patient Care Team: [name], MD as PCP - General (Internal Medicine) Nicholas Lose, MD as Consulting Physician (Hematology and Oncology) Rolm Bookbinder, MD as Consulting Physician (General Surgery) Irene Limbo, MD as Consulting Physician (Plastic Surgery) Rockwell Germany, RN as Registered Nurse Mauro Kaufmann, RN as Registered Nurse Jake Shark Johny Blamer, NP as Nurse Practitioner (Hematology and Oncology) Paula Compton, MD as Consulting Physician (Obstetrics and Gynecology)  DIAGNOSIS:  Encounter Diagnosis  Name Primary?  . Malignant neoplasm of upper-outer quadrant of left breast in female, estrogen receptor positive ([place])     SUMMARY OF ONCOLOGIC HISTORY:   Breast cancer of upper-outer quadrant of left female breast ([place])   08/13/2014 Mammogram    Left breast: 2 cm circumscribed mass      08/13/2014 Breast US    Left breast: two masses: #1: 1:00 10 x 9 x 13 mmm irregular; #2: 2:00: 16 x 7 x 14 mm; no LAD      08/27/2014 Initial Diagnosis    O/S excisional biopsy: 2 masses showing 1.5 cm and 0.9 cm IDC, Grade 3, ER + (90%), PR- (0%), HER-2 positive (ratio 2.5), Ki67 ~30%, Multifocal      08/27/2014 Clinical Stage    Stage IIA/IIB: T2/T3 N0      09/17/2014 Procedure    MyRisk panel (Myriad) 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 Otherwise negative at APC, ATM, BARD1, BMPR1A, BRCA1, BRCA2, BRIP1, CHD1, CDK4, CDKN2A, CHEK2, MLH1, MSH2, MSH6, MUTYH, NBN, PALB2, PMS2, PTEN, RAD51C, RAD51D, SMAD4, STK11, and TP53      09/20/2014 Breast MRI    RIGHT: 1 x 0.8 x 0.9 cm lobulated border mass in the retroareolar lower slight lateral area. LEFT: hematoma with surrounding adjacent enhancement encompassing a 4.6 x 4.9 x 4.1 cm area.       09/27/2014 - 01/10/2015 Neo-Adjuvant Chemotherapy    Neoadjuvant TCH Perjeta every 3 week 6 followed by Herceptin maintenance completed 09/21/2015      01/13/2015 Breast MRI    Postsurgical changes in left breast without  residual enhancing masses compatible with treatment response      02/10/2015 Definitive Surgery    Bilateral mastectomies: RIGHT: benign.  LEFT: complete path response;  0/3 sentinel nodes      02/10/2015 Pathologic Stage    ypT0 ypN0 ypM0      02/22/2015 -  Anti-estrogen oral therapy    Zoladex with tamoxifen, stopped Zoladex for intolerance, decreased tamoxifen to 10 mg daily.      11/17/2015 Survivorship    Survivorship care plan completed and given to patient       CHIEF COMPLIANT: Follow-up on tamoxifen  INTERVAL HISTORY: Natasha Torres is a 36 year old with above-mentioned history bilateral mastectomies for a left-sided breast cancer. Complete pathologic response and is currently on tamoxifen therapy at 10 mg daily. She continues to have muscle aches and pains throughout her body and she is also extremely anxious and stressed out about the risk of breast cancer recurrence. She is working full-time and is taking care of her daughter.  REVIEW OF SYSTEMS:   Constitutional: Denies fevers, chills or abnormal weight loss Eyes: Denies blurriness of vision Ears, nose, mouth, throat, and face: Denies mucositis or sore throat Respiratory: Denies cough, dyspnea or wheezes Cardiovascular: Denies palpitation, chest discomfort Gastrointestinal:  Denies nausea, heartburn or change in bowel habits Skin: Denies abnormal skin rashes Lymphatics: Denies new lymphadenopathy or easy bruising Neurological:Denies numbness, tingling or new weaknesses Behavioral/Psych: Emotional and anxious  Extremities: No lower extremity edema Breast:  Tender spot reconstructed breast on the right All other systems were reviewed with the patient and are negative.  I have reviewed the past medical history, past surgical history, social history and family history with the patient and they are unchanged from previous note.  ALLERGIES:  is allergic to buprenorphine hcl; morphine and related; other; penicillins; and  tegaderm ag mesh [silver].  MEDICATIONS:  Current Outpatient Prescriptions  Medication Sig Dispense Refill  . cholecalciferol (VITAMIN D) 1000 units tablet Take 2,000 Units by mouth daily.    . ferrous sulfate 325 (65 FE) MG tablet Take 325 mg by mouth daily with breakfast.    . LORazepam (ATIVAN) 0.5 MG tablet Take 1 tablet (0.5 mg total) by mouth at bedtime. 30 tablet 1  . Multiple Vitamin (MULTIVITAMIN) tablet Take 1 tablet by mouth daily.    . tamoxifen (NOLVADEX) 10 MG tablet Take 10 mg by mouth daily.     No current facility-administered medications for this visit.     PHYSICAL EXAMINATION: ECOG PERFORMANCE STATUS: 1 - Symptomatic but completely ambulatory  Vitals:   11/20/16 1004  BP: 132/67  Pulse: 70  Resp: 18  Temp: 97.6 F (36.4 C)   Filed Weights   11/20/16 1004  Weight: 177 lb 12.8 oz (80.6 kg)    GENERAL:alert, no distress and comfortable SKIN: skin color, texture, turgor are normal, no rashes or significant lesions EYES: normal, Conjunctiva are pink and non-injected, sclera clear OROPHARYNX:no exudate, no erythema and lips, buccal mucosa, and tongue normal  NECK: supple, thyroid normal size, non-tender, without nodularity LYMPH:  no palpable lymphadenopathy in the cervical, axillary or inguinal LUNGS: clear to auscultation and percussion with normal breathing effort HEART: regular rate & rhythm and no murmurs and no lower extremity edema ABDOMEN:abdomen soft, non-tender and normal bowel sounds MUSCULOSKELETAL:no cyanosis of digits and no clubbing  NEURO: alert & oriented x 3 with fluent speech, no focal motor/sensory deficits EXTREMITIES: No lower extremity edema BREAST: No palpable lumps or nodules in bilateral reconstructed breasts or axilla. (exam performed in the presence of a chaperone)  LABORATORY DATA:  I have reviewed the data as listed   Chemistry      Component Value Date/Time   NA 140 11/20/2016 0936   K 4.3 11/20/2016 0936   CL 107  05/30/2016 1024   CO2 24 11/20/2016 0936   BUN 11.8 11/20/2016 0936   CREATININE 0.8 11/20/2016 0936      Component Value Date/Time   CALCIUM 9.6 11/20/2016 0936   ALKPHOS 51 11/20/2016 0936   AST 29 11/20/2016 0936   ALT 15 11/20/2016 0936   BILITOT 0.44 11/20/2016 0936       Lab Results  Component Value Date   WBC 4.2 11/20/2016   HGB 12.4 11/20/2016   HCT 37.4 11/20/2016   MCV 91.5 11/20/2016   PLT 172 11/20/2016   NEUTROABS 2.1 11/20/2016    ASSESSMENT & PLAN:  Breast cancer of upper-outer quadrant of left female breast 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 Biopsy of the right breast mass and left axillary lymph node and the secondary mass left breast back as benign. Status post neo-adjuvant TCH Perjeta 6 cycles from 09/27/2014 to 01/10/2015 Followed  by bilateral mastectomies on 02/10/2015: No malignancy in either breast Herceptin maintenance completed 09/21/2015 ----------------------------------------------------------------------------------------------------------------------------------------------------- Current treatment: Zoladex with tamoxifen started 02/22/2015 Stopped 11/17/2015 due to musculoskeletal aches and pains; Now on Tamoxifen alone restarted Mar 18, 2016  Tamoxifen toxicities: 1. Hot flashes resolved after we stopped Zoladex 2. Depression: mild to moderate depression  3. Arthralgias and myalgias: Patient stopped tamoxifen June 2017, restarted 03/18/2016. Currently on 10 mg of tamoxifen daily from 05/21/2016.   Weight issues:Patient did not benefit from Contrave treatment. Chronic fatigue syndrome: On vitamin B-12 injection since  December 2017. Anxiety and emotional issues: She takes Ativan very occasionally. I renewed her prescription today.  Return to clinic in 1 year for follow-up.   I spent 25 minutes talking to the patient of which more than half was spent in counseling and coordination of care.  No orders of the defined types were placed in this encounter.  The patient has a good understanding of the overall plan. she agrees with it. she will call with any problems that may develop before the next visit here.   Rulon Eisenmenger, MD 11/20/16

## 2017-06-03 MED FILL — FLUCONAZOLE 150 MG TABLET: 150 | 2 days supply | Qty: 2 | Fill #0

## 2017-06-13 DIAGNOSIS — J020 Streptococcal pharyngitis: Secondary | ICD-10-CM | POA: Diagnosis not present

## 2017-06-14 DIAGNOSIS — L814 Other melanin hyperpigmentation: Secondary | ICD-10-CM | POA: Diagnosis not present

## 2017-06-14 DIAGNOSIS — D1801 Hemangioma of skin and subcutaneous tissue: Secondary | ICD-10-CM | POA: Diagnosis not present

## 2017-06-14 DIAGNOSIS — D225 Melanocytic nevi of trunk: Secondary | ICD-10-CM | POA: Diagnosis not present

## 2017-07-01 ENCOUNTER — Other Ambulatory Visit: Payer: Self-pay | Admitting: Plastic Surgery

## 2017-07-01 ENCOUNTER — Telehealth: Payer: Self-pay | Admitting: Internal Medicine

## 2017-07-01 DIAGNOSIS — Z23 Encounter for immunization: Secondary | ICD-10-CM | POA: Diagnosis not present

## 2017-07-01 DIAGNOSIS — Z9013 Acquired absence of bilateral breasts and nipples: Secondary | ICD-10-CM | POA: Diagnosis not present

## 2017-07-01 DIAGNOSIS — N6320 Unspecified lump in the left breast, unspecified quadrant: Secondary | ICD-10-CM

## 2017-07-01 DIAGNOSIS — Z853 Personal history of malignant neoplasm of breast: Secondary | ICD-10-CM | POA: Diagnosis not present

## 2017-07-01 MED ORDER — LORAZEPAM 0.5 MG PO TABS: 0.5000 mg | ORAL_TABLET | Freq: Every evening | ORAL | 0 refills | Status: DC | PRN

## 2017-07-01 NOTE — Telephone Encounter (Signed)
Check [place] registry last filled 11/21/2016.Marland KitchenJohny Torres

## 2017-07-01 NOTE — Telephone Encounter (Signed)
Copied from [place] [id number]. Topic: Quick Communication - Rx Refill/Question >> Jul 01, 2017 10:03 AM Cecelia Byars, NT wrote: Medication: ativan 0.5 mg Has the patient contacted their pharmacy? {no (Agent: If no, request that the patient contact the pharmacy for the refill.) Preferred Pharmacy (with phone number or street name):[facility] in [place]: Please be advised that RX refills may take up to 3 business days. We ask that you follow-up with your pharmacy. Please call when patient can pick up prescription [phone]

## 2017-07-01 NOTE — Telephone Encounter (Signed)
Rx refill request

## 2017-07-01 NOTE — Telephone Encounter (Signed)
Prescription sent

## 2017-07-03 ENCOUNTER — Ambulatory Visit
Admission: RE | Admit: 2017-07-03 | Discharge: 2017-07-03 | Disposition: A | Discharge status: Home / Self Care | Payer: 59 | Source: Ambulatory Visit | Attending: Plastic Surgery | Admitting: Plastic Surgery

## 2017-07-03 ENCOUNTER — Other Ambulatory Visit: Payer: Self-pay | Admitting: Plastic Surgery

## 2017-07-03 DIAGNOSIS — N6320 Unspecified lump in the left breast, unspecified quadrant: Secondary | ICD-10-CM

## 2017-07-24 DIAGNOSIS — Z9013 Acquired absence of bilateral breasts and nipples: Secondary | ICD-10-CM | POA: Diagnosis not present

## 2017-07-24 DIAGNOSIS — Z853 Personal history of malignant neoplasm of breast: Secondary | ICD-10-CM | POA: Diagnosis not present

## 2017-08-08 DIAGNOSIS — Z1321 Encounter for screening for nutritional disorder: Secondary | ICD-10-CM | POA: Diagnosis not present

## 2017-08-08 DIAGNOSIS — Z1159 Encounter for screening for other viral diseases: Secondary | ICD-10-CM | POA: Diagnosis not present

## 2017-08-08 DIAGNOSIS — Z01419 Encounter for gynecological examination (general) (routine) without abnormal findings: Secondary | ICD-10-CM | POA: Diagnosis not present

## 2017-08-08 DIAGNOSIS — Z6824 Body mass index (BMI) 24.0-24.9, adult: Secondary | ICD-10-CM | POA: Diagnosis not present

## 2017-08-13 DIAGNOSIS — L309 Dermatitis, unspecified: Secondary | ICD-10-CM | POA: Diagnosis not present

## 2017-10-02 ENCOUNTER — Other Ambulatory Visit: Payer: 59

## 2017-10-07 ENCOUNTER — Ambulatory Visit
Admission: RE | Admit: 2017-10-07 | Discharge: 2017-10-07 | Disposition: A | Discharge status: Home / Self Care | Payer: 59 | Source: Ambulatory Visit | Attending: Plastic Surgery | Admitting: Plastic Surgery

## 2017-10-07 DIAGNOSIS — N641 Fat necrosis of breast: Secondary | ICD-10-CM | POA: Diagnosis not present

## 2017-10-07 DIAGNOSIS — N6320 Unspecified lump in the left breast, unspecified quadrant: Secondary | ICD-10-CM

## 2017-10-09 ENCOUNTER — Other Ambulatory Visit: Payer: Self-pay | Admitting: Hematology and Oncology

## 2017-10-09 DIAGNOSIS — C50919 Malignant neoplasm of unspecified site of unspecified female breast: Secondary | ICD-10-CM

## 2017-10-09 MED FILL — TAMOXIFEN 20 MG TABLET: 20 | 30 days supply | Qty: 15 | Fill #0

## 2017-11-20 ENCOUNTER — Inpatient Hospital Stay: Payer: 59 | Attending: Hematology and Oncology | Admitting: Hematology and Oncology

## 2017-11-20 ENCOUNTER — Telehealth: Payer: Self-pay | Admitting: Hematology and Oncology

## 2017-11-20 DIAGNOSIS — C50412 Malignant neoplasm of upper-outer quadrant of left female breast: Secondary | ICD-10-CM | POA: Diagnosis not present

## 2017-11-20 DIAGNOSIS — R635 Abnormal weight gain: Secondary | ICD-10-CM | POA: Insufficient documentation

## 2017-11-20 DIAGNOSIS — F418 Other specified anxiety disorders: Secondary | ICD-10-CM | POA: Insufficient documentation

## 2017-11-20 DIAGNOSIS — M791 Myalgia, unspecified site: Secondary | ICD-10-CM | POA: Insufficient documentation

## 2017-11-20 DIAGNOSIS — Z9012 Acquired absence of left breast and nipple: Secondary | ICD-10-CM | POA: Diagnosis not present

## 2017-11-20 DIAGNOSIS — R5382 Chronic fatigue, unspecified: Secondary | ICD-10-CM | POA: Diagnosis not present

## 2017-11-20 DIAGNOSIS — R232 Flushing: Secondary | ICD-10-CM | POA: Diagnosis not present

## 2017-11-20 DIAGNOSIS — Z79810 Long term (current) use of selective estrogen receptor modulators (SERMs): Secondary | ICD-10-CM | POA: Diagnosis not present

## 2017-11-20 DIAGNOSIS — Z79899 Other long term (current) drug therapy: Secondary | ICD-10-CM | POA: Insufficient documentation

## 2017-11-20 DIAGNOSIS — Z170 Estrogen receptor positive status [ER+]: Secondary | ICD-10-CM | POA: Diagnosis not present

## 2017-11-20 MED ORDER — LORAZEPAM 0.5 MG PO TABS: 0.5000 mg | ORAL_TABLET | Freq: Every evening | ORAL | 0 refills | Status: DC | PRN

## 2017-11-20 MED ORDER — TAMOXIFEN CITRATE 10 MG PO TABS: 10.0000 mg | ORAL_TABLET | Freq: Every day | ORAL | 3 refills | Status: DC

## 2017-11-20 NOTE — Progress Notes (Signed)
Patient Care Team: [name], MD as PCP - General (Internal Medicine) Nicholas Lose, MD as Consulting Physician (Hematology and Oncology) Rolm Bookbinder, MD as Consulting Physician (General Surgery) Irene Limbo, MD as Consulting Physician (Plastic Surgery) Rockwell Germany, RN as Registered Nurse Mauro Kaufmann, RN as Registered Nurse Jake Shark Johny Blamer, NP as Nurse Practitioner (Hematology and Oncology) Paula Compton, MD as Consulting Physician (Obstetrics and Gynecology)  DIAGNOSIS:  Encounter Diagnosis  Name Primary?  . Malignant neoplasm of upper-outer quadrant of left breast in female, estrogen receptor positive ([place])     SUMMARY OF ONCOLOGIC HISTORY:   Breast cancer of upper-outer quadrant of left female breast ([place])   08/13/2014 Mammogram    Left breast: 2 cm circumscribed mass      08/13/2014 Breast US    Left breast: two masses: #1: 1:00 10 x 9 x 13 mmm irregular; #2: 2:00: 16 x 7 x 14 mm; no LAD      08/27/2014 Initial Diagnosis    O/S excisional biopsy: 2 masses showing 1.5 cm and 0.9 cm IDC, Grade 3, ER + (90%), PR- (0%), HER-2 positive (ratio 2.5), Ki67 ~30%, Multifocal      08/27/2014 Clinical Stage    Stage IIA/IIB: T2/T3 N0      09/17/2014 Procedure    MyRisk panel ([facility]) 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 Otherwise negative at APC, ATM, BARD1, BMPR1A, BRCA1, BRCA2, BRIP1, CHD1, CDK4, CDKN2A, CHEK2, MLH1, MSH2, MSH6, MUTYH, NBN, PALB2, PMS2, PTEN, RAD51C, RAD51D, SMAD4, STK11, and TP53      09/20/2014 Breast MRI    RIGHT: 1 x 0.8 x 0.9 cm lobulated border mass in the retroareolar lower slight lateral area. LEFT: hematoma with surrounding adjacent enhancement encompassing a 4.6 x 4.9 x 4.1 cm area.       09/27/2014 - 01/10/2015 Neo-Adjuvant Chemotherapy    Neoadjuvant TCH Perjeta every 3 week 6 followed by Herceptin maintenance completed 09/21/2015      01/13/2015 Breast MRI    Postsurgical changes in left breast without  residual enhancing masses compatible with treatment response      02/10/2015 Definitive Surgery    Bilateral mastectomies: RIGHT: benign.  LEFT: complete path response;  0/3 sentinel nodes      02/10/2015 Pathologic Stage    ypT0 ypN0 ypM0      02/22/2015 -  Anti-estrogen oral therapy    Zoladex with tamoxifen, stopped Zoladex for intolerance, decreased tamoxifen to 10 mg daily.      11/17/2015 Survivorship    Survivorship care plan completed and given to patient       CHIEF COMPLIANT: Follow-up on tamoxifen therapy  INTERVAL HISTORY: Natasha Torres is a 37 year old with above-mentioned history of left breast cancer treated with neoadjuvant chemotherapy to complete pathologic response and is currently on oral antiestrogen therapy with tamoxifen.  She is able to tolerate 10 mg of tamoxifen daily.  She has been tolerating the 10 mg reasonably well.  She has been struggling previously with arthralgias and myalgias.  She also has depression and hot flashes.  She is complaining of weight gain and has done everything she can to lose weight.  REVIEW OF SYSTEMS:   Constitutional: Denies fevers, chills or abnormal weight loss Eyes: Denies blurriness of vision Ears, nose, mouth, throat, and face: Denies mucositis or sore throat Respiratory: Denies cough, dyspnea or wheezes Cardiovascular: Denies palpitation, chest discomfort Gastrointestinal:  Denies nausea, heartburn or change in bowel habits Skin: Denies abnormal skin rashes Lymphatics: Denies new lymphadenopathy or  easy bruising Neurological:Denies numbness, tingling or new weaknesses Behavioral/Psych: Mood is stable, no new changes  Extremities: No lower extremity edema  All other systems were reviewed with the patient and are negative.  I have reviewed the past medical history, past surgical history, social history and family history with the patient and they are unchanged from previous note.  ALLERGIES:  is allergic to buprenorphine  hcl; morphine and related; other; penicillins; and tegaderm ag mesh [silver].  MEDICATIONS:  Current Outpatient Medications  Medication Sig Dispense Refill  . cholecalciferol (VITAMIN D) 1000 units tablet Take 2,000 Units by mouth daily.    . ferrous sulfate 325 (65 FE) MG tablet Take 325 mg by mouth daily with breakfast.    . LORazepam (ATIVAN) 0.5 MG tablet Take 1 tablet (0.5 mg total) by mouth at bedtime as needed for anxiety. 30 tablet 0  . Multiple Vitamin (MULTIVITAMIN) tablet Take 1 tablet by mouth daily.    . tamoxifen (NOLVADEX) 10 MG tablet Take 1 tablet (10 mg total) by mouth daily. 90 tablet 3  . tamoxifen (NOLVADEX) 20 MG tablet Take 0.5 tablets (10 mg total) by mouth daily. 90 tablet 1   No current facility-administered medications for this visit.     PHYSICAL EXAMINATION: ECOG PERFORMANCE STATUS: 1 - Symptomatic but completely ambulatory  There were no vitals filed for this visit. There were no vitals filed for this visit.  GENERAL:alert, no distress and comfortable SKIN: skin color, texture, turgor are normal, no rashes or significant lesions EYES: normal, Conjunctiva are pink and non-injected, sclera clear OROPHARYNX:no exudate, no erythema and lips, buccal mucosa, and tongue normal  NECK: supple, thyroid normal size, non-tender, without nodularity LYMPH:  no palpable lymphadenopathy in the cervical, axillary or inguinal LUNGS: clear to auscultation and percussion with normal breathing effort HEART: regular rate & rhythm and no murmurs and no lower extremity edema ABDOMEN:abdomen soft, non-tender and normal bowel sounds MUSCULOSKELETAL:no cyanosis of digits and no clubbing  NEURO: alert & oriented x 3 with fluent speech, no focal motor/sensory deficits EXTREMITIES: No lower extremity edema BREAST: No palpable lumps or nodules in bilateral chest wall or axilla (exam performed in the presence of a chaperone)  LABORATORY DATA:  I have reviewed the data as listed CMP  Latest Ref Rng & Units 11/20/2016 05/30/2016 09/21/2015  Glucose 70 - 140 mg/dl 84 88 94  BUN 7.0 - 26.0 mg/dL 11.8 17 19.6  Creatinine 0.6 - 1.1 mg/dL 0.8 0.68 0.8  Sodium 136 - 145 mEq/L 140 140 140  Potassium 3.5 - 5.1 mEq/L 4.3 4.6 4.0  Chloride 96 - 112 mEq/L - 107 -  CO2 22 - 29 mEq/L 24 27 27   Calcium 8.4 - 10.4 mg/dL 9.6 9.2 9.3  Total Protein 6.4 - 8.3 g/dL 6.9 6.9 7.2  Total Bilirubin 0.20 - 1.20 mg/dL 0.44 0.2 <0.30  Alkaline Phos 40 - 150 U/L 51 64 57  AST 5 - 34 U/L 29 18 25   ALT 0 - 55 U/L 15 10 15     Lab Results  Component Value Date   WBC 4.2 11/20/2016   HGB 12.4 11/20/2016   HCT 37.4 11/20/2016   MCV 91.5 11/20/2016   PLT 172 11/20/2016   NEUTROABS 2.1 11/20/2016    ASSESSMENT & PLAN:  Breast cancer of upper-outer quadrant of left female breast 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 Biopsy of the right breast mass and left axillary lymph node and the secondary mass left breast back as benign. Status post neo-adjuvant TCH Perjeta 6 cycles from 09/27/2014 to 01/10/2015 Followed by bilateral mastectomies on 02/10/2015: No malignancy in either breast Herceptin maintenance completed 09/21/2015 ----------------------------------------------------------------------------------------------------------------------------------------------------- Current treatment: Zoladex with tamoxifen started 02/22/2015 Stopped 11/17/2015 due to musculoskeletal aches and pains; Now on Tamoxifen alone restarted Mar 18, 2016  Tamoxifen toxicities: 1. Hot flashesresolved after we stopped Zoladex 2. Depression: mild to moderate depression  3. Arthralgias and myalgias:  Patient stopped tamoxifen June 2017, restarted 03/18/2016. Currently on 10 mg of tamoxifen daily from 05/21/2016.  Weight issues:Patient did not benefit from Contrave treatment. Chronic fatigue syndrome: On vitamin B-12 injection since December 2017. Anxiety and emotional issues: She takes Ativan very occasionally. I renewed her prescription today.  Breast cancer surveillance: 1.  Breast examination no palpable lumps or nodules 2. no role of mammogram since she had bilateral mastectomies  Return to clinic in 1 year for follow-up.    No orders of the defined types were placed in this encounter.  The patient has a good understanding of the overall plan. she agrees with it. she will call with any problems that may develop before the next visit here.   Harriette Ohara, MD 11/20/17

## 2017-11-20 NOTE — Assessment & Plan Note (Signed)
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 Biopsy of the right breast mass and left axillary lymph node and the secondary mass left breast back as benign. Status post neo-adjuvant TCH Perjeta 6 cycles from 09/27/2014 to 01/10/2015 Followed by bilateral mastectomies on 02/10/2015: No malignancy in either breast Herceptin maintenance completed 09/21/2015 ----------------------------------------------------------------------------------------------------------------------------------------------------- Current treatment: Zoladex with tamoxifen started 02/22/2015 Stopped 11/17/2015 due to musculoskeletal aches and pains; Now on Tamoxifen alone restarted Mar 18, 2016  Tamoxifen toxicities: 1. Hot flashesresolved after we stopped Zoladex 2. Depression: mild to moderate depression  3. Arthralgias and myalgias: Patient stopped tamoxifen June 2017, restarted 03/18/2016. Currently on 10 mg of tamoxifen daily from 05/21/2016.  Weight issues:Patient did not benefit from Contrave treatment. Chronic fatigue syndrome: On vitamin B-12 injection since December 2017. Anxiety and emotional issues: She takes Ativan very occasionally. I renewed her prescription today.  Breast cancer surveillance: 1.  Breast examination no palpable lumps or nodules 2. no role of mammogram since she had bilateral mastectomies  Return to clinic in 1 year for follow-up.

## 2017-11-20 NOTE — Telephone Encounter (Signed)
Patient declined avs and calendar  °

## 2017-12-09 DIAGNOSIS — N390 Urinary tract infection, site not specified: Secondary | ICD-10-CM | POA: Diagnosis not present

## 2018-03-25 DIAGNOSIS — Z23 Encounter for immunization: Secondary | ICD-10-CM | POA: Diagnosis not present

## 2018-07-24 DIAGNOSIS — Z9013 Acquired absence of bilateral breasts and nipples: Secondary | ICD-10-CM | POA: Diagnosis not present

## 2018-07-24 DIAGNOSIS — Z853 Personal history of malignant neoplasm of breast: Secondary | ICD-10-CM | POA: Diagnosis not present

## 2018-08-26 ENCOUNTER — Telehealth: Payer: Self-pay | Admitting: Internal Medicine

## 2018-08-26 NOTE — Telephone Encounter (Signed)
Appointment scheduled.

## 2018-08-26 NOTE — Telephone Encounter (Signed)
Patient has not been seen since 2018. Needs an office visit.

## 2018-08-26 NOTE — Telephone Encounter (Signed)
Copied from [place] [id number]. Topic: Quick Communication - See Telephone Encounter >> Aug 26, 2018  8:07 AM Robina Ade, Helene Kelp D wrote: CRM for notification. See Telephone encounter for: 08/26/18. Patient called and wants to talk to Dr. Quay Burow or her CMA about getting a Rx for lexapro due to not feeling well. Please call patient back, thanks.

## 2018-08-27 NOTE — Progress Notes (Signed)
Subjective:    Patient ID: Natasha Torres, female    DOB: [birth year], 38 y.o.   MRN: [id number]  HPI The patient is here for an acute visit.   Anxiety, depression: Over the past 3 months she has been feeling overwhelmed, experiencing anxiety and depression.  There have been several things in her life that have happened it is not just one thing.  The cumulative effect has gotten to the point that she is having difficulty dealing with it and thinks she needs medication to help.  She has had difficulty breathing, feeling angry and crying a lot.  She also has had difficulty sleeping and has been eating more than usual.  She denies chest pain or palpitations.  She was on Lexapro in the past and it was effective.  She is also on Effexor at one point for hot flashes after having chemotherapy and she is unsure if that really helped or not.  She does have Ativan and uses as needed and has not needed to use it more often.  It does make her tired at times and she tries not to use it, but it is very effective.  Medications and allergies reviewed with patient and updated if appropriate.  Patient Active Problem List   Diagnosis Date Noted  . Attention deficit disorder 07/25/2016  . Iron deficiency anemia 06/05/2016  . Vitamin D deficiency 06/05/2016  . Chronic fatigue syndrome 05/21/2016  . Lymphedema 04/04/2015  . Acquired absence of breast and nipple 02/27/2015  . S/P bilateral mastectomy 02/10/2015  . 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 Breast cancer of upper-outer quadrant of left female breast ([place]) 09/14/2014    Current Outpatient Medications on File Prior to Visit  Medication Sig Dispense Refill  . cholecalciferol (VITAMIN D) 1000 units tablet Take 2,000 Units by mouth daily.    [name] LORazepam (ATIVAN) 0.5 MG tablet Take 1 tablet (0.5 mg total) by mouth at bedtime as needed for anxiety. 30 tablet 0  . Multiple Vitamin (MULTIVITAMIN) tablet Take 1 tablet by mouth  daily.    . tamoxifen (NOLVADEX) 10 MG tablet Take 1 tablet (10 mg total) by mouth daily. 90 tablet 3   No current facility-administered medications on file prior to visit.     Past Medical History:  Diagnosis Date  . Anemia    takes iron supplement  . Family history of adverse reaction to anesthesia    pt's mother has hx. of post-op N/V  . History of breast cancer 2016  . Tattoo of skin 06/07/2016   new tattoo right ankle    Past Surgical History:  Procedure Laterality Date  . BREAST LUMPECTOMY Left 08/2014  . BREAST RECONSTRUCTION WITH PLACEMENT OF TISSUE EXPANDER AND FLEX HD (ACELLULAR HYDRATED DERMIS) Bilateral 02/10/2015   Procedure: BREAST RECONSTRUCTION WITH PLACEMENT OF TISSUE EXPANDER AND FLEX HD (ACELLULAR HYDRATED DERMIS);  Surgeon: Irene Limbo, MD;  Location: [facility];  Service: Plastics;  Laterality: Bilateral;  . INCISION AND DRAINAGE OF WOUND Left 03/02/2015   Procedure: IRRIGATION BREAST POCKET AND EXCHANGE OF LEFT BREAST TISSUE EXPANDER;  Surgeon: Irene Limbo, MD;  Location: [facility];  Service: Plastics;  Laterality: Left;  . LIPOSUCTION WITH LIPOFILLING Bilateral 06/07/2015   Procedure: LIPOSUCTION WITH LIPOFILLING TO BILATERAL CHEST;  Surgeon: Irene Limbo, MD;  Location: [facility];  Service: Plastics;  Laterality: Bilateral;  . LIPOSUCTION WITH LIPOFILLING Bilateral 09/23/2015   Procedure: LIPOFILLING FROM BILATERAL THIGHS TO BILATERAL CHEST ;  Surgeon: Irene Limbo, MD;  Location: [place];  Service: Plastics;  Laterality: Bilateral;  . LIPOSUCTION WITH LIPOFILLING Bilateral 06/15/2016   Procedure: LIPOFILLING FROM BILATERAL THIGH TO BILATERAL CHEST;  Surgeon: Irene Limbo, MD;  Location: [facility];  Service: Plastics;  Laterality: Bilateral;  LIPOFILLING FROM BILATERAL THIGH TO BILATERAL CHEST  . MASTECTOMY Bilateral 02/10/2015  . NIPPLE SPARING MASTECTOMY/SENTINAL LYMPH NODE  BIOPSY/RECONSTRUCTION/PLACEMENT OF TISSUE EXPANDER Bilateral 02/10/2015   Procedure: BILATERAL  NIPPLE SPARING MASTECTOMY WITH LEFT  SENTINAL LYMPH NODE BIOPSY(RIGHT BREAST PROPHYLACTIC);  Surgeon: Rolm Bookbinder, MD;  Location: [facility];  Service: General;  Laterality: Bilateral;  . ORIF FINGER / THUMB FRACTURE Right 1994   thumb  . PORT-A-CATH REMOVAL  01/07/2015   removed and replaced  . PORT-A-CATH REMOVAL Right 09/23/2015   Procedure: REMOVAL PORT-A-CATH;  Surgeon: Irene Limbo, MD;  Location: [facility];  Service: Plastics;  Laterality: Right;  . PORTACATH PLACEMENT  08/2014; 01/07/2015  . REMOVAL OF BILATERAL TISSUE EXPANDERS WITH PLACEMENT OF BILATERAL BREAST IMPLANTS Bilateral 06/07/2015   Procedure: REMOVAL OF BILATERAL TISSUE EXPANDERS WITH PLACEMENT OF BILATERAL BREAST  SILICONE IMPLANTS;  Surgeon: Irene Limbo, MD;  Location: [place];  Service: Plastics;  Laterality: Bilateral;  . REMOVAL OF TISSUE EXPANDER AND PLACEMENT OF IMPLANT Right 02/27/2015   Procedure: REMOVAL OF TISSUE EXPANDER AND PLACEMENT OF NEW TISSUE EXPANDER;  Surgeon: Irene Limbo, MD;  Location: [facility];  Service: Plastics;  Laterality: Right;  . SALPINGOOPHORECTOMY Right 11/22/2010  . TISSUE EXPANDER PLACEMENT Left 03/02/2015   Procedure: TISSUE EXPANDER;  Surgeon: Irene Limbo, MD;  Location: [facility];  Service: Plastics;  Laterality: Left;  . WISDOM TOOTH EXTRACTION  2001    Social History   Socioeconomic History  . Marital status: Married    Spouse name: Harrell Gave  . Number of children: 1  . Years of education: Not on file  . Highest education level: Not on file  Occupational History  . Not on file  Social Needs  . Financial resource strain: Not on file  . Food insecurity:    Worry: Not on file    Inability: Not on file  . Transportation needs:    Medical: Not on file    Non-medical: Not on file  Tobacco Use  . Smoking status: Never Smoker  . Smokeless  tobacco: Never Used  Substance and Sexual Activity  . Alcohol use: Yes    Alcohol/week: 0.0 standard drinks    Comment: 1-3 drinks 2-3 x/week  . Drug use: No  . Sexual activity: Not Currently  Lifestyle  . Physical activity:    Days per week: Not on file    Minutes per session: Not on file  . Stress: Not on file  Relationships  . Social connections:    Talks on phone: Not on file    Gets together: Not on file    Attends religious service: Not on file    Active member of club or organization: Not on file    Attends meetings of clubs or organizations: Not on file    Relationship status: Not on file  Other Topics Concern  . Not on file  Social History Narrative   ** Merged History Encounter **        Family History  Problem Relation Age of Onset  . Hypertension Mother   . Anesthesia problems Mother        post-op N/V  . Prostate cancer Father 36  . Depression  Maternal Aunt   . Dementia Maternal Grandmother   . Cancer Maternal Grandfather   . Heart attack Maternal Grandfather   . Dementia Paternal Grandmother   . Kidney cancer Paternal Grandmother        slow growing, no treatment  . Cancer Paternal Grandfather   . Prostate cancer Paternal Grandfather 29  . Bone cancer Paternal Grandfather 75  . Breast cancer Paternal Grandfather 7  . Lung cancer Paternal Grandfather        dx late 72s; smoker.  thought to be a 4th primary cancer    Review of Systems  Constitutional: Positive for appetite change.  Respiratory: Positive for shortness of breath.   Cardiovascular: Negative for chest pain and palpitations.  Psychiatric/Behavioral: Positive for dysphoric mood and sleep disturbance. Negative for suicidal ideas. The patient is nervous/anxious.        Objective:   Vitals:   08/28/18 1511  BP: 106/62  Pulse: 61  Resp: 16  Temp: 98.6 F (37 C)   BP Readings from Last 3 Encounters:  08/28/18 106/62  11/20/17 122/70  11/20/16 132/67   Wt Readings from Last 3  Encounters:  08/28/18 168 lb (76.2 kg)  11/20/17 173 lb 14.4 oz (78.9 kg)  11/20/16 177 lb 12.8 oz (80.6 kg)   Body mass index is 23.43 kg/m.   Physical Exam    Constitutional: Appears well-developed and well-nourished. No distress.  HENT:  Head: Normocephalic and atraumatic.  Cardiovascular: Normal rate, regular rhythm and normal heart sounds.    No edema Pulmonary/Chest: Effort normal and breath sounds normal. No respiratory distress. No has no wheezes. No rales.  Skin: Skin is warm and dry. Not diaphoretic.  Psychiatric: anxious mood and affect. Behavior is normal.       Assessment & Plan:    See Problem List for Assessment and Plan of chronic medical problems.

## 2018-08-28 ENCOUNTER — Encounter: Payer: Self-pay | Admitting: Internal Medicine

## 2018-08-28 ENCOUNTER — Ambulatory Visit (INDEPENDENT_AMBULATORY_CARE_PROVIDER_SITE_OTHER): Payer: 59 | Admitting: Internal Medicine

## 2018-08-28 DIAGNOSIS — F419 Anxiety disorder, unspecified: Secondary | ICD-10-CM | POA: Diagnosis not present

## 2018-08-28 DIAGNOSIS — F329 Major depressive disorder, single episode, unspecified: Secondary | ICD-10-CM | POA: Diagnosis not present

## 2018-08-28 DIAGNOSIS — F32A Depression, unspecified: Secondary | ICD-10-CM | POA: Insufficient documentation

## 2018-08-28 MED ORDER — ESCITALOPRAM OXALATE 10 MG PO TABS: 10.0000 mg | ORAL_TABLET | Freq: Every day | ORAL | 1 refills | Status: DC

## 2018-08-28 MED ORDER — LORAZEPAM 0.5 MG PO TABS: 0.5000 mg | ORAL_TABLET | Freq: Every evening | ORAL | 0 refills | Status: DC | PRN

## 2018-08-28 NOTE — Assessment & Plan Note (Signed)
For the past 3 months she has been experiencing anxiety and depression We will start Lexapro 10 mg daily Continue Ativan only as needed-take 1/2-1 pill daily as needed She understands the Ativan is addicting and she should only take this when absolutely needed Follow-up in 4 weeks

## 2018-09-12 ENCOUNTER — Encounter: Payer: 59 | Admitting: Internal Medicine

## 2018-10-21 DIAGNOSIS — Z6824 Body mass index (BMI) 24.0-24.9, adult: Secondary | ICD-10-CM | POA: Diagnosis not present

## 2018-10-21 DIAGNOSIS — Z01419 Encounter for gynecological examination (general) (routine) without abnormal findings: Secondary | ICD-10-CM | POA: Diagnosis not present

## 2018-11-14 NOTE — Assessment & Plan Note (Signed)
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 Biopsy of the right breast mass and left axillary lymph node and the secondary mass left breast back as benign. Status post neo-adjuvant TCH Perjeta 6 cycles from 09/27/2014 to 01/10/2015 Followed by bilateral mastectomies on 02/10/2015: No malignancy in either breast Herceptin maintenance completed 09/21/2015 ----------------------------------------------------------------------------------------------------------------------------------------------------- Current treatment: Zoladex with tamoxifen started 02/22/2015 Stopped 11/17/2015 due to musculoskeletal aches and pains; Now on Tamoxifen alone restarted Mar 18, 2016  Tamoxifen toxicities: 1. Hot flashesresolved after we stopped Zoladex 2. Depression: mild to moderate depression  3. Arthralgias and myalgias: Patient stopped tamoxifen June 2017, restarted 03/18/2016. Currently on 10 mg of tamoxifen daily from 05/21/2016.  Weight issues:Patient did not benefit from Contrave treatment. Chronic fatigue syndrome: On vitamin B-12 injection since December 2017. Anxiety and emotional issues: She takes Ativan very occasionally. I renewed her prescription today.  Breast cancer surveillance: 1.  Breast examination no palpable lumps or nodules 2. no role of mammogram since she had bilateral mastectomies  Return to clinic in 1 year for follow-up. 

## 2018-11-18 NOTE — Progress Notes (Signed)
Subjective:    Patient ID: Natasha Torres, female    DOB: 05/18/1981, 38 y.o.   MRN: [id number]  HPI She is here for a physical exam.   We restarted lexapro in March for depression and anxiety.  She is taking the medication daily and denies side effects.  The lexapro is very helpful, her anxiety and depression are better.  She has gained weight.  She is not happy about the weight gain and wants to lose weight.    She has no other concerns.   Medications and allergies reviewed with patient and updated if appropriate.  Patient Active Problem List   Diagnosis Date Noted  . Anxiety and depression 08/28/2018  . Attention deficit disorder 07/25/2016  . Iron deficiency anemia 06/05/2016  . Vitamin D deficiency 06/05/2016  . Chronic fatigue syndrome 05/21/2016  . Lymphedema 04/04/2015  . S/P bilateral mastectomy 02/10/2015  . 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 Breast cancer of upper-outer quadrant of left female breast ([place]) 09/14/2014    Current Outpatient Medications on File Prior to Visit  Medication Sig Dispense Refill  . cholecalciferol (VITAMIN D) 1000 units tablet Take 2,000 Units by mouth daily.    [name] escitalopram (LEXAPRO) 10 MG tablet Take 1 tablet (10 mg total) by mouth daily. 90 tablet 1  . LORazepam (ATIVAN) 0.5 MG tablet Take 1 tablet (0.5 mg total) by mouth at bedtime as needed for anxiety. 30 tablet 0  . Multiple Vitamin (MULTIVITAMIN) tablet Take 1 tablet by mouth daily.    . tamoxifen (NOLVADEX) 10 MG tablet Take 1 tablet (10 mg total) by mouth daily. 90 tablet 3   No current facility-administered medications on file prior to visit.     Past Medical History:  Diagnosis Date  . Anemia    takes iron supplement  . Family history of adverse reaction to anesthesia    pt's mother has hx. of post-op N/V  . History of breast cancer 2016  . Tattoo of skin 06/07/2016   new tattoo right ankle    Past Surgical History:  Procedure  Laterality Date  . BREAST LUMPECTOMY Left 08/2014  . BREAST RECONSTRUCTION WITH PLACEMENT OF TISSUE EXPANDER AND FLEX HD (ACELLULAR HYDRATED DERMIS) Bilateral 02/10/2015   Procedure: BREAST RECONSTRUCTION WITH PLACEMENT OF TISSUE EXPANDER AND FLEX HD (ACELLULAR HYDRATED DERMIS);  Surgeon: Irene Limbo, MD;  Location: [facility];  Service: Plastics;  Laterality: Bilateral;  . INCISION AND DRAINAGE OF WOUND Left 03/02/2015   Procedure: IRRIGATION BREAST POCKET AND EXCHANGE OF LEFT BREAST TISSUE EXPANDER;  Surgeon: Irene Limbo, MD;  Location: [facility];  Service: Plastics;  Laterality: Left;  . LIPOSUCTION WITH LIPOFILLING Bilateral 06/07/2015   Procedure: LIPOSUCTION WITH LIPOFILLING TO BILATERAL CHEST;  Surgeon: Irene Limbo, MD;  Location: [facility];  Service: Plastics;  Laterality: Bilateral;  . LIPOSUCTION WITH LIPOFILLING Bilateral 09/23/2015   Procedure: LIPOFILLING FROM BILATERAL THIGHS TO BILATERAL CHEST ;  Surgeon: Irene Limbo, MD;  Location: [facility];  Service: Plastics;  Laterality: Bilateral;  . LIPOSUCTION WITH LIPOFILLING Bilateral 06/15/2016   Procedure: LIPOFILLING FROM BILATERAL THIGH TO BILATERAL CHEST;  Surgeon: Irene Limbo, MD;  Location: [facility];  Service: Plastics;  Laterality: Bilateral;  LIPOFILLING FROM BILATERAL THIGH TO BILATERAL CHEST  . MASTECTOMY Bilateral 02/10/2015  . NIPPLE SPARING MASTECTOMY/SENTINAL LYMPH NODE BIOPSY/RECONSTRUCTION/PLACEMENT OF TISSUE EXPANDER Bilateral 02/10/2015   Procedure: BILATERAL  NIPPLE SPARING MASTECTOMY WITH LEFT  SENTINAL LYMPH NODE  BIOPSY(RIGHT BREAST PROPHYLACTIC);  Surgeon: Rolm Bookbinder, MD;  Location: [facility];  Service: General;  Laterality: Bilateral;  . ORIF FINGER / THUMB FRACTURE Right 1994   thumb  . PORT-A-CATH REMOVAL  01/07/2015   removed and replaced  . PORT-A-CATH REMOVAL Right 09/23/2015   Procedure: REMOVAL PORT-A-CATH;  Surgeon: Irene Limbo, MD;  Location:  [facility];  Service: Plastics;  Laterality: Right;  . PORTACATH PLACEMENT  08/2014; 01/07/2015  . REMOVAL OF BILATERAL TISSUE EXPANDERS WITH PLACEMENT OF BILATERAL BREAST IMPLANTS Bilateral 06/07/2015   Procedure: REMOVAL OF BILATERAL TISSUE EXPANDERS WITH PLACEMENT OF BILATERAL BREAST  SILICONE IMPLANTS;  Surgeon: Irene Limbo, MD;  Location: [facility];  Service: Plastics;  Laterality: Bilateral;  . REMOVAL OF TISSUE EXPANDER AND PLACEMENT OF IMPLANT Right 02/27/2015   Procedure: REMOVAL OF TISSUE EXPANDER AND PLACEMENT OF NEW TISSUE EXPANDER;  Surgeon: Irene Limbo, MD;  Location: [place];  Service: Plastics;  Laterality: Right;  . SALPINGOOPHORECTOMY Right 11/22/2010  . TISSUE EXPANDER PLACEMENT Left 03/02/2015   Procedure: TISSUE EXPANDER;  Surgeon: Irene Limbo, MD;  Location: [place] [facility];  Service: Plastics;  Laterality: Left;  . WISDOM TOOTH EXTRACTION  2001    Social History   Socioeconomic History  . Marital status: Married    Spouse name: Harrell Gave  . Number of children: 1  . Years of education: Not on file  . Highest education level: Not on file  Occupational History  . Not on file  Social Needs  . Financial resource strain: Not on file  . Food insecurity:    Worry: Not on file    Inability: Not on file  . Transportation needs:    Medical: Not on file    Non-medical: Not on file  Tobacco Use  . Smoking status: Never Smoker  . Smokeless tobacco: Never Used  Substance and Sexual Activity  . Alcohol use: Yes    Alcohol/week: 0.0 standard drinks    Comment: 1-3 drinks 2-3 x/week  . Drug use: No  . Sexual activity: Not Currently  Lifestyle  . Physical activity:    Days per week: Not on file    Minutes per session: Not on file  . Stress: Not on file  Relationships  . Social connections:    Talks on phone: Not on file    Gets together: Not on file    Attends religious service: Not on file    Active member of club or organization:  Not on file    Attends meetings of clubs or organizations: Not on file    Relationship status: Not on file  Other Topics Concern  . Not on file  Social History Narrative   ** Merged History Encounter **        Family History  Problem Relation Age of Onset  . Hypertension Mother   . Anesthesia problems Mother        post-op N/V  . Prostate cancer Father 43  . Depression Maternal Aunt   . Dementia Maternal Grandmother   . Cancer Maternal Grandfather   . Heart attack Maternal Grandfather   . Dementia Paternal Grandmother   . Kidney cancer Paternal Grandmother        slow growing, no treatment  . Cancer Paternal Grandfather   . Prostate cancer Paternal Grandfather 58  . Bone cancer Paternal Grandfather 29  . Breast cancer Paternal Grandfather 60  . Lung cancer Paternal Grandfather        dx late 45s; smoker.  thought to be a 4th primary cancer    Review of Systems  Constitutional: Negative for chills and fever.  Eyes: Negative for visual disturbance.  Respiratory: Negative for cough, shortness of breath and wheezing.   Cardiovascular: Negative for chest pain, palpitations and leg swelling.  Gastrointestinal: Negative for abdominal pain, blood in stool, constipation, diarrhea and nausea.       No gerd  Genitourinary: Positive for pelvic pain (intermittent from ovarian cyst). Negative for dysuria and hematuria.  Musculoskeletal: Negative for arthralgias and back pain.  Skin: Negative for color change and rash.  Neurological: Negative for light-headedness and headaches.  Psychiatric/Behavioral: Positive for dysphoric mood. The patient is nervous/anxious.        Objective:   Vitals:   11/19/18 0819  BP: 108/62  Pulse: 65  Resp: 16  Temp: 98.5 F (36.9 C)  SpO2: 99%   Filed Weights   11/19/18 0819  Weight: 178 lb (80.7 kg)   Body mass index is 24.83 kg/m.  BP Readings from Last 3 Encounters:  11/19/18 108/62  08/28/18 106/62  11/20/17 122/70    Wt Readings  from Last 3 Encounters:  11/19/18 178 lb (80.7 kg)  08/28/18 168 lb (76.2 kg)  11/20/17 173 lb 14.4 oz (78.9 kg)     Physical Exam Constitutional: She appears well-developed and well-nourished. No distress.  HENT:  Head: Normocephalic and atraumatic.  Right Ear: External ear normal. Normal ear canal and TM Left Ear: External ear normal.  Normal ear canal and TM Mouth/Throat: Oropharynx is clear and moist.  Eyes: Conjunctivae and EOM are normal.  Neck: Neck supple. No tracheal deviation present. No thyromegaly present.  No carotid bruit  Cardiovascular: Normal rate, regular rhythm and normal heart sounds.   No murmur heard.  No edema. Pulmonary/Chest: Effort normal and breath sounds normal. No respiratory distress. She has no wheezes. She has no rales.  Breast: deferred   Abdominal: Soft. She exhibits no distension. There is no tenderness.  Lymphadenopathy: She has no cervical adenopathy.  Skin: Skin is warm and dry. She is not diaphoretic.  Psychiatric: She has a normal mood and affect. Her behavior is normal.        Assessment & Plan:   Physical exam: Screening blood work ordered Immunizations   Up to date  Mammogram  N/a s/p b/l mastectomy Gyn   Up to date  - [facility] Exercise  regular Weight  Has gained weight since starting lexapro - working on weight loss Skin  No concerns,  Sees derm annually Substance abuse  none  See Problem List for Assessment and Plan of chronic medical problems.  FU in one year

## 2018-11-18 NOTE — Patient Instructions (Addendum)
Tests ordered today. Your results will be released to [place] (or called to you) after review, usually within 72hours after test completion. If any changes need to be made, you will be notified at that same time.  All other Health Maintenance issues reviewed.   All recommended immunizations and age-appropriate screenings are up-to-date or discussed.  No immunizations administered today.   Medications reviewed and updated.  Changes include : decrease lexapro to 5 mg daily      Please followup in one year   Health Maintenance, Female Adopting a healthy lifestyle and getting preventive care can go a long way to promote health and wellness. Talk with your health care provider about what schedule of regular examinations is right for you. This is a good chance for you to check in with your provider about disease prevention and staying healthy. In between checkups, there are plenty of things you can do on your own. Experts have done a lot of research about which lifestyle changes and preventive measures are most likely to keep you healthy. Ask your health care provider for more information. Weight and diet Eat a healthy diet  Be sure to include plenty of vegetables, fruits, low-fat dairy products, and lean protein.  Do not eat a lot of foods high in solid fats, added sugars, or salt.  Get regular exercise. This is one of the most important things you can do for your health. ? Most adults should exercise for at least 150 minutes each week. The exercise should increase your heart rate and make you sweat (moderate-intensity exercise). ? Most adults should also do strengthening exercises at least twice a week. This is in addition to the moderate-intensity exercise. Maintain a healthy weight  Body mass index (BMI) is a measurement that can be used to identify possible weight problems. It estimates body fat based on height and weight. Your health care provider can help determine your BMI and help  you achieve or maintain a healthy weight.  For females 85 years of age and older: ? A BMI below 18.5 is considered underweight. ? A BMI of 18.5 to 24.9 is normal. ? A BMI of 25 to 29.9 is considered overweight. ? A BMI of 30 and above is considered obese. Watch levels of cholesterol and blood lipids  You should start having your blood tested for lipids and cholesterol at 39 years of age, then have this test every 5 years.  You may need to have your cholesterol levels checked more often if: ? Your lipid or cholesterol levels are high. ? You are older than 39 years of age. ? You are at high risk for heart disease. Cancer screening Lung Cancer  Lung cancer screening is recommended for adults 68-9 years old who are at high risk for lung cancer because of a history of smoking.  A yearly low-dose CT scan of the lungs is recommended for people who: ? Currently smoke. ? Have quit within the past 15 years. ? Have at least a 30-pack-year history of smoking. A pack year is smoking an average of one pack of cigarettes a day for 1 year.  Yearly screening should continue until it has been 15 years since you quit.  Yearly screening should stop if you develop a health problem that would prevent you from having lung cancer treatment. Breast Cancer  Practice breast self-awareness. This means understanding how your breasts normally appear and feel.  It also means doing regular breast self-exams. Let your health care provider know about  any changes, no matter how small.  If you are in your 20s or 30s, you should have a clinical breast exam (CBE) by a health care provider every 1-3 years as part of a regular health exam.  If you are 52 or older, have a CBE every year. Also consider having a breast X-ray (mammogram) every year.  If you have a family history of breast cancer, talk to your health care provider about genetic screening.  If you are at high risk for breast cancer, talk to your health  care provider about having an MRI and a mammogram every year.  Breast cancer gene (BRCA) assessment is recommended for women who have family members with BRCA-related cancers. BRCA-related cancers include: ? Breast. ? Ovarian. ? Tubal. ? Peritoneal cancers.  Results of the assessment will determine the need for genetic counseling and BRCA1 and BRCA2 testing. Cervical Cancer Your health care provider may recommend that you be screened regularly for cancer of the pelvic organs (ovaries, uterus, and vagina). This screening involves a pelvic examination, including checking for microscopic changes to the surface of your cervix (Pap test). You may be encouraged to have this screening done every 3 years, beginning at age 64.  For women ages 37-65, health care providers may recommend pelvic exams and Pap testing every 3 years, or they may recommend the Pap and pelvic exam, combined with testing for human papilloma virus (HPV), every 5 years. Some types of HPV increase your risk of cervical cancer. Testing for HPV may also be done on women of any age with unclear Pap test results.  Other health care providers may not recommend any screening for nonpregnant women who are considered low risk for pelvic cancer and who do not have symptoms. Ask your health care provider if a screening pelvic exam is right for you.  If you have had past treatment for cervical cancer or a condition that could lead to cancer, you need Pap tests and screening for cancer for at least 20 years after your treatment. If Pap tests have been discontinued, your risk factors (such as having a new sexual partner) need to be reassessed to determine if screening should resume. Some women have medical problems that increase the chance of getting cervical cancer. In these cases, your health care provider may recommend more frequent screening and Pap tests. Colorectal Cancer  This type of cancer can be detected and often prevented.  Routine  colorectal cancer screening usually begins at 39 years of age and continues through 39 years of age.  Your health care provider may recommend screening at an earlier age if you have risk factors for colon cancer.  Your health care provider may also recommend using home test kits to check for hidden blood in the stool.  A small camera at the end of a tube can be used to examine your colon directly (sigmoidoscopy or colonoscopy). This is done to check for the earliest forms of colorectal cancer.  Routine screening usually begins at age 50.  Direct examination of the colon should be repeated every 5-10 years through 39 years of age. However, you may need to be screened more often if early forms of precancerous polyps or small growths are found. Skin Cancer  Check your skin from head to toe regularly.  Tell your health care provider about any new moles or changes in moles, especially if there is a change in a mole's shape or color.  Also tell your health care provider if you have  a mole that is larger than the size of a pencil eraser.  Always use sunscreen. Apply sunscreen liberally and repeatedly throughout the day.  Protect yourself by wearing long sleeves, pants, a wide-brimmed hat, and sunglasses whenever you are outside. Heart disease, diabetes, and high blood pressure  High blood pressure causes heart disease and increases the risk of stroke. High blood pressure is more likely to develop in: ? People who have blood pressure in the high end of the normal range (130-139/85-89 mm Hg). ? People who are overweight or obese. ? People who are African American.  If you are 2-53 years of age, have your blood pressure checked every 3-5 years. If you are 12 years of age or older, have your blood pressure checked every year. You should have your blood pressure measured twice-once when you are at a hospital or clinic, and once when you are not at a hospital or clinic. Record the average of the two  measurements. To check your blood pressure when you are not at a hospital or clinic, you can use: ? An automated blood pressure machine at a pharmacy. ? A home blood pressure monitor.  If you are between 5 years and 79 years old, ask your health care provider if you should take aspirin to prevent strokes.  Have regular diabetes screenings. This involves taking a blood sample to check your fasting blood sugar level. ? If you are at a normal weight and have a low risk for diabetes, have this test once every three years after 39 years of age. ? If you are overweight and have a high risk for diabetes, consider being tested at a younger age or more often. Preventing infection Hepatitis B  If you have a higher risk for hepatitis B, you should be screened for this virus. You are considered at high risk for hepatitis B if: ? You were born in a country where hepatitis B is common. Ask your health care provider which countries are considered high risk. ? Your parents were born in a high-risk country, and you have not been immunized against hepatitis B (hepatitis B vaccine). ? You have HIV or AIDS. ? You use needles to inject street drugs. ? You live with someone who has hepatitis B. ? You have had sex with someone who has hepatitis B. ? You get hemodialysis treatment. ? You take certain medicines for conditions, including cancer, organ transplantation, and autoimmune conditions. Hepatitis C  Blood testing is recommended for: ? Everyone born from [birth year] through 1966. ? Anyone with known risk factors for hepatitis C. Sexually transmitted infections (STIs)  You should be screened for sexually transmitted infections (STIs) including gonorrhea and chlamydia if: ? You are sexually active and are younger than 39 years of age. ? You are older than 39 years of age and your health care provider tells you that you are at risk for this type of infection. ? Your sexual activity has changed since you were last  screened and you are at an increased risk for chlamydia or gonorrhea. Ask your health care provider if you are at risk.  If you do not have HIV, but are at risk, it may be recommended that you take a prescription medicine daily to prevent HIV infection. This is called pre-exposure prophylaxis (PrEP). You are considered at risk if: ? You are sexually active and do not regularly use condoms or know the HIV status of your partner(s). ? You take drugs by injection. ? You are sexually  active with a partner who has HIV. Talk with your health care provider about whether you are at high risk of being infected with HIV. If you choose to begin PrEP, you should first be tested for HIV. You should then be tested every 3 months for as long as you are taking PrEP. Pregnancy  If you are premenopausal and you may become pregnant, ask your health care provider about preconception counseling.  If you may become pregnant, take 400 to 800 micrograms (mcg) of folic acid every day.  If you want to prevent pregnancy, talk to your health care provider about birth control (contraception). Osteoporosis and menopause  Osteoporosis is a disease in which the bones lose minerals and strength with aging. This can result in serious bone fractures. Your risk for osteoporosis can be identified using a bone density scan.  If you are 62 years of age or older, or if you are at risk for osteoporosis and fractures, ask your health care provider if you should be screened.  Ask your health care provider whether you should take a calcium or vitamin D supplement to lower your risk for osteoporosis.  Menopause may have certain physical symptoms and risks.  Hormone replacement therapy may reduce some of these symptoms and risks. Talk to your health care provider about whether hormone replacement therapy is right for you. Follow these instructions at home:  Schedule regular health, dental, and eye exams.  Stay current with your  immunizations.  Do not use any tobacco products including cigarettes, chewing tobacco, or electronic cigarettes.  If you are pregnant, do not drink alcohol.  If you are breastfeeding, limit how much and how often you drink alcohol.  Limit alcohol intake to no more than 1 drink per day for nonpregnant women. One drink equals 12 ounces of beer, 5 ounces of wine, or 1 ounces of hard liquor.  Do not use street drugs.  Do not share needles.  Ask your health care provider for help if you need support or information about quitting drugs.  Tell your health care provider if you often feel depressed.  Tell your health care provider if you have ever been abused or do not feel safe at home. This information is not intended to replace advice given to you by your health care provider. Make sure you discuss any questions you have with your health care provider. Document Released: 12/18/2010 Document Revised: 11/10/2015 Document Reviewed: 03/08/2015 Elsevier Interactive Patient Education  2019 [employer].

## 2018-11-19 ENCOUNTER — Other Ambulatory Visit: Payer: Self-pay

## 2018-11-19 ENCOUNTER — Ambulatory Visit (INDEPENDENT_AMBULATORY_CARE_PROVIDER_SITE_OTHER): Payer: 59 | Admitting: Internal Medicine

## 2018-11-19 ENCOUNTER — Encounter: Payer: Self-pay | Admitting: Internal Medicine

## 2018-11-19 ENCOUNTER — Other Ambulatory Visit (INDEPENDENT_AMBULATORY_CARE_PROVIDER_SITE_OTHER): Payer: 59

## 2018-11-19 VITALS — BP 108/62 | HR 65 | Temp 98.5°F | Resp 16 | Ht 71.0 in | Wt 178.0 lb

## 2018-11-19 DIAGNOSIS — F32A Depression, unspecified: Secondary | ICD-10-CM

## 2018-11-19 DIAGNOSIS — E559 Vitamin D deficiency, unspecified: Secondary | ICD-10-CM

## 2018-11-19 DIAGNOSIS — F419 Anxiety disorder, unspecified: Secondary | ICD-10-CM

## 2018-11-19 DIAGNOSIS — C50919 Malignant neoplasm of unspecified site of unspecified female breast: Secondary | ICD-10-CM | POA: Diagnosis not present

## 2018-11-19 DIAGNOSIS — Z0000 Encounter for general adult medical examination without abnormal findings: Secondary | ICD-10-CM | POA: Diagnosis not present

## 2018-11-19 DIAGNOSIS — F329 Major depressive disorder, single episode, unspecified: Secondary | ICD-10-CM

## 2018-11-19 LAB — COMPREHENSIVE METABOLIC PANEL
ALT: 14 U/L (ref 0–35)
AST: 23 U/L (ref 0–37)
Albumin: 4.1 g/dL (ref 3.5–5.2)
Alkaline Phosphatase: 36 U/L — ABNORMAL LOW (ref 39–117)
BUN: 17 mg/dL (ref 6–23)
CO2: 25 mEq/L (ref 19–32)
Calcium: 8.9 mg/dL (ref 8.4–10.5)
Chloride: 106 mEq/L (ref 96–112)
Creatinine, Ser: 0.79 mg/dL (ref 0.40–1.20)
GFR: 81.2 mL/min (ref >60.00)
Glucose, Bld: 90 mg/dL (ref 70–99)
Potassium: 4.1 mEq/L (ref 3.5–5.1)
Sodium: 138 mEq/L (ref 135–145)
Total Bilirubin: 0.4 mg/dL (ref 0.2–1.2)
Total Protein: 7 g/dL (ref 6.0–8.3)

## 2018-11-19 LAB — CBC WITH DIFFERENTIAL/PLATELET
Basophils Absolute: 0.1 10*3/uL (ref 0.0–0.1)
Basophils Relative: 1.3 % (ref 0.0–3.0)
Eosinophils Absolute: 0.1 10*3/uL (ref 0.0–0.7)
Eosinophils Relative: 2.4 % (ref 0.0–5.0)
HCT: 37.6 % (ref 36.0–46.0)
Hemoglobin: 12.6 g/dL (ref 12.0–15.0)
Lymphocytes Relative: 37 % (ref 12.0–46.0)
Lymphs Abs: 1.5 10*3/uL (ref 0.7–4.0)
MCHC: 33.6 g/dL (ref 30.0–36.0)
MCV: 91.5 fl (ref 78.0–100.0)
Monocytes Absolute: 0.4 10*3/uL (ref 0.1–1.0)
Monocytes Relative: 9.7 % (ref 3.0–12.0)
Neutro Abs: 2 10*3/uL (ref 1.4–7.7)
Neutrophils Relative %: 49.6 % (ref 43.0–77.0)
Platelets: 179 10*3/uL (ref 150.0–400.0)
RBC: 4.11 Mil/uL (ref 3.87–5.11)
RDW: 13.5 % (ref 11.5–15.5)
WBC: 4.1 10*3/uL (ref 4.0–10.5)

## 2018-11-19 LAB — LIPID PANEL
Cholesterol: 201 mg/dL — ABNORMAL HIGH (ref 0–200)
HDL: 104.1 mg/dL (ref >39.00)
LDL Cholesterol: 90 mg/dL (ref 0–99)
NonHDL: 96.95
Total CHOL/HDL Ratio: 2
Triglycerides: 37 mg/dL (ref 0.0–149.0)
VLDL: 7.4 mg/dL (ref 0.0–40.0)

## 2018-11-19 LAB — TSH: TSH: 1.24 u[IU]/mL (ref 0.35–4.50)

## 2018-11-19 MED ORDER — ESCITALOPRAM OXALATE 10 MG PO TABS: 5.0000 mg | ORAL_TABLET | Freq: Every day | ORAL | 1 refills | Status: DC

## 2018-11-19 NOTE — Assessment & Plan Note (Signed)
Taking vitamin d daily 

## 2018-11-19 NOTE — Assessment & Plan Note (Signed)
Following with oncology - Dr Lindi Adie - has appt this week On tamoxifen

## 2018-11-19 NOTE — Assessment & Plan Note (Signed)
Improved with lexapro 10 mg daily but has gained weight  Decrease dose to 5 mg daily Doing all the things that help anxiety and depression - exercise, etc Can consider adding wellbutrin - will d/w oncology since on tamoxifen

## 2018-11-20 NOTE — Progress Notes (Signed)
Patient Care Team: [name], MD as PCP - General (Internal Medicine) Nicholas Lose, MD as Consulting Physician (Hematology and Oncology) Rolm Bookbinder, MD as Consulting Physician (General Surgery) Irene Limbo, MD as Consulting Physician (Plastic Surgery) Rockwell Germany, RN as Registered Nurse Mauro Kaufmann, RN as Registered Nurse Sylvan Cheese, NP as Nurse Practitioner (Hematology and Oncology) Paula Compton, MD as Consulting Physician (Obstetrics and Gynecology)  DIAGNOSIS:    ICD-10-CM   1. Malignant neoplasm of upper-outer quadrant of left breast in female, estrogen receptor positive ([place]) C50.412 US BREAST LTD UNI LEFT INC AXILLA   Z17.0     SUMMARY OF ONCOLOGIC HISTORY:   Breast cancer of upper-outer quadrant of left female breast ([place])   08/13/2014 Mammogram    Left breast: 2 cm circumscribed mass    08/13/2014 Breast US    Left breast: two masses: #1: 1:00 10 x 9 x 13 mmm irregular; #2: 2:00: 16 x 7 x 14 mm; no LAD    08/27/2014 Initial Diagnosis    O/S excisional biopsy: 2 masses showing 1.5 cm and 0.9 cm IDC, Grade 3, ER + (90%), PR- (0%), HER-2 positive (ratio 2.5), Ki67 ~30%, Multifocal    08/27/2014 Clinical Stage    Stage IIA/IIB: T2/T3 N0    09/17/2014 Procedure    MyRisk panel ([facility]) 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 Otherwise negative at APC, ATM, BARD1, BMPR1A, BRCA1, BRCA2, BRIP1, CHD1, CDK4, CDKN2A, CHEK2, MLH1, MSH2, MSH6, MUTYH, NBN, PALB2, PMS2, PTEN, RAD51C, RAD51D, SMAD4, STK11, and TP53    09/20/2014 Breast MRI    RIGHT: 1 x 0.8 x 0.9 cm lobulated border mass in the retroareolar lower slight lateral area. LEFT: hematoma with surrounding adjacent enhancement encompassing a 4.6 x 4.9 x 4.1 cm area.     09/27/2014 - 01/10/2015 Neo-Adjuvant Chemotherapy    Neoadjuvant TCH Perjeta every 3 week 6 followed by Herceptin maintenance completed 09/21/2015    01/13/2015 Breast MRI    Postsurgical changes in left breast  without residual enhancing masses compatible with treatment response    02/10/2015 Definitive Surgery    Bilateral mastectomies: RIGHT: benign.  LEFT: complete path response;  0/3 sentinel nodes    02/10/2015 Pathologic Stage    ypT0 ypN0 ypM0    02/22/2015 -  Anti-estrogen oral therapy    Zoladex with tamoxifen, stopped Zoladex for intolerance, decreased tamoxifen to 10 mg daily.    11/17/2015 Survivorship    Survivorship care plan completed and given to patient     CHIEF COMPLIANT: Follow-up on tamoxifen therapy  INTERVAL HISTORY: Natasha Torres is a 38 y.o. with above-mentioned history of left breast cancer treated with neoadjuvant chemotherapy, bilateral mastectomies, and who is currently on anti-estrogen therapy with tamoxifen. I last saw her one year ago. She presents to the clinic today for annual follow-up.  She complained of a lump in the right breast. She also complaining of a 10 pound weight gain in the past 2 months. She is currently on Lexapro 10 mg daily and has had remarkable improvement in her emotional state.  She is worried that Lexapro may be causing her weight gain.  REVIEW OF SYSTEMS:   Constitutional: Denies fevers, chills.  Extremely worried about weight gain Eyes: Denies blurriness of vision Ears, nose, mouth, throat, and face: Denies mucositis or sore throat Respiratory: Denies cough, dyspnea or wheezes Cardiovascular: Denies palpitation, chest discomfort Gastrointestinal: Denies nausea, heartburn or change in bowel habits Skin: Denies abnormal skin rashes Lymphatics: Denies new lymphadenopathy or  easy bruising Neurological: Denies numbness, tingling or new weaknesses Behavioral/Psych: Her depression has significantly improved Extremities: No lower extremity edema Breast: Small palpable nodule in the right axilla adjacent to the breast implants on the superior aspect All other systems were reviewed with the patient and are negative.  I have reviewed the past  medical history, past surgical history, social history and family history with the patient and they are unchanged from previous note.  ALLERGIES:  is allergic to buprenorphine hcl; morphine and related; other; penicillins; and tegaderm ag mesh [silver].  MEDICATIONS:  Current Outpatient Medications  Medication Sig Dispense Refill  . cholecalciferol (VITAMIN D) 1000 units tablet Take 2,000 Units by mouth daily.    [name] escitalopram (LEXAPRO) 10 MG tablet Take 0.5 tablets (5 mg total) by mouth daily. 90 tablet 1  . LORazepam (ATIVAN) 0.5 MG tablet Take 1 tablet (0.5 mg total) by mouth at bedtime as needed for anxiety. 30 tablet 0  . Multiple Vitamin (MULTIVITAMIN) tablet Take 1 tablet by mouth daily.    . tamoxifen (NOLVADEX) 10 MG tablet Take 1 tablet (10 mg total) by mouth daily. 90 tablet 3  . venlafaxine XR (EFFEXOR XR) 37.5 MG 24 hr capsule Take 1 capsule (37.5 mg total) by mouth daily with breakfast. 30 capsule 11   No current facility-administered medications for this visit.     PHYSICAL EXAMINATION: ECOG PERFORMANCE STATUS: 0 - Asymptomatic  Vitals:   11/21/18 0911  BP: 116/73  Pulse: 67  Resp: 20  Temp: 98.6 F (37 C)  SpO2: 100%   Filed Weights   11/21/18 0911  Weight: 176 lb 6.4 oz (80 kg)    General exam was not performed due to COVID-19 precautions. BREAST: Bilateral reconstructed breasts.  Right axillary area on the superior aspect there is a small palpable nodule that feels like fat necrosis.  (exam performed in the presence of a chaperone)  LABORATORY DATA:  I have reviewed the data as listed CMP Latest Ref Rng & Units 11/19/2018 11/20/2016 05/30/2016  Glucose 70 - 99 mg/dL 90 84 88  BUN 6 - 23 mg/dL 17 11.8 17  Creatinine 0.40 - 1.20 mg/dL 0.79 0.8 0.68  Sodium 135 - 145 mEq/L 138 140 140  Potassium 3.5 - 5.1 mEq/L 4.1 4.3 4.6  Chloride 96 - 112 mEq/L 106 - 107  CO2 19 - 32 mEq/L 25 24 27   Calcium 8.4 - 10.5 mg/dL 8.9 9.6 9.2  Total Protein 6.0 - 8.3 g/dL 7.0  6.9 6.9  Total Bilirubin 0.2 - 1.2 mg/dL 0.4 0.44 0.2  Alkaline Phos 39 - 117 U/L 36(L) 51 64  AST 0 - 37 U/L 23 29 18   ALT 0 - 35 U/L 14 15 10     Lab Results  Component Value Date   WBC 4.1 11/19/2018   HGB 12.6 11/19/2018   HCT 37.6 11/19/2018   MCV 91.5 11/19/2018   PLT 179.0 11/19/2018   NEUTROABS 2.0 11/19/2018    ASSESSMENT & PLAN:  Breast cancer of upper-outer quadrant of left female breast 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 Biopsy of the right breast mass and left axillary lymph node and the secondary mass left breast back as benign.  Status post neo-adjuvant TCH Perjeta 6 cycles from 09/27/2014 to 01/10/2015 Followed by bilateral mastectomies on 02/10/2015: No malignancy in either breast Herceptin maintenance completed 09/21/2015 ----------------------------------------------------------------------------------------------------------------------------------------------------- Current treatment: Zoladex with tamoxifen started 02/22/2015 Stopped 11/17/2015 due to musculoskeletal aches and pains; Now on Tamoxifen alone restarted Mar 18, 2016  Tamoxifen toxicities: 1. Hot flashesresolved after we stopped Zoladex 2. Depression:mild to moderate depression  3. Arthralgias and myalgias: Patient stopped tamoxifen June 2017, restarted 03/18/2016.Currently on10 mg of tamoxifen daily from 05/21/2016. She wants to try going back to 20 mg and see how she feels. We discussed the role of ovarian suppression which has been shown to have some benefit in women less than age 56 but because of her adverse effects when she was on Zoladex she does not want to do it.   Weight issues:Patient did not benefit from Contrave treatment.  Anxiety and emotional issues: She takes Ativan very occasionally.  Currently on Lexapro.  She wanted to know if it is safe to take Lexapro and we discussed different options and I recommended switching to Effexor.  I gave her a new prescription for Effexor which he tolerated it very well previously.  There is a theoretical interaction between tamoxifen and Lexapro and that is the reason for the switch. Her mood and depression symptoms are markedly improved.  Breast cancer surveillance: 1.  Breast examination no palpable lumps or nodules 2. no role of mammogram since she had bilateral mastectomies   We will obtain an ultrasound of the right axilla to evaluate the nodule.  I suspect that this is fat necrosis.  Return to clinic in1 yearfor follow-up using Doximity video visit.    Orders Placed This Encounter  Procedures  . US BREAST LTD UNI LEFT INC AXILLA    Standing Status:   Future    Standing Expiration Date:   01/21/2020    Order Specific Question:   Reason for Exam (SYMPTOM  OR DIAGNOSIS REQUIRED)    Answer:   Right breast axillary lump    Order Specific Question:   Preferred imaging location?    Answer:   [facility]   The patient has a good understanding of the overall plan. she agrees with it. she will call with any problems that may develop before the next visit here.  Nicholas Lose, MD 11/21/2018  Julious Oka Dorshimer am acting as scribe for Dr. Nicholas Lose.  I have reviewed the above documentation for accuracy and completeness, and I agree with the above.

## 2018-11-21 ENCOUNTER — Inpatient Hospital Stay: Payer: 59 | Attending: Hematology and Oncology | Admitting: Hematology and Oncology

## 2018-11-21 ENCOUNTER — Other Ambulatory Visit: Payer: Self-pay

## 2018-11-21 DIAGNOSIS — R635 Abnormal weight gain: Secondary | ICD-10-CM | POA: Diagnosis not present

## 2018-11-21 DIAGNOSIS — C50412 Malignant neoplasm of upper-outer quadrant of left female breast: Secondary | ICD-10-CM | POA: Diagnosis not present

## 2018-11-21 DIAGNOSIS — Z79899 Other long term (current) drug therapy: Secondary | ICD-10-CM | POA: Diagnosis not present

## 2018-11-21 DIAGNOSIS — Z9012 Acquired absence of left breast and nipple: Secondary | ICD-10-CM | POA: Diagnosis not present

## 2018-11-21 DIAGNOSIS — Z79810 Long term (current) use of selective estrogen receptor modulators (SERMs): Secondary | ICD-10-CM | POA: Diagnosis not present

## 2018-11-21 DIAGNOSIS — Z170 Estrogen receptor positive status [ER+]: Secondary | ICD-10-CM | POA: Diagnosis not present

## 2018-11-21 DIAGNOSIS — F329 Major depressive disorder, single episode, unspecified: Secondary | ICD-10-CM | POA: Diagnosis not present

## 2018-11-21 MED ORDER — VENLAFAXINE HCL ER 37.5 MG PO CP24: 37.5000 mg | ORAL_CAPSULE | Freq: Every day | ORAL | 11 refills | Status: DC

## 2018-12-05 ENCOUNTER — Ambulatory Visit
Admission: RE | Admit: 2018-12-05 | Discharge: 2018-12-05 | Disposition: A | Discharge status: Home / Self Care | Payer: 59 | Source: Ambulatory Visit | Attending: Hematology and Oncology | Admitting: Hematology and Oncology

## 2018-12-05 ENCOUNTER — Other Ambulatory Visit: Payer: Self-pay | Admitting: Hematology and Oncology

## 2018-12-05 ENCOUNTER — Other Ambulatory Visit: Payer: Self-pay

## 2018-12-05 DIAGNOSIS — Z170 Estrogen receptor positive status [ER+]: Secondary | ICD-10-CM

## 2018-12-05 DIAGNOSIS — C50412 Malignant neoplasm of upper-outer quadrant of left female breast: Secondary | ICD-10-CM

## 2019-02-06 ENCOUNTER — Other Ambulatory Visit: Payer: Self-pay | Admitting: Hematology and Oncology

## 2019-03-10 ENCOUNTER — Other Ambulatory Visit: Payer: Self-pay | Admitting: Internal Medicine

## 2019-03-10 NOTE — Telephone Encounter (Signed)
Last OV 11/19/18 Next OV NA Last RF 08/28/18

## 2019-04-27 ENCOUNTER — Other Ambulatory Visit: Payer: Self-pay | Admitting: Internal Medicine

## 2019-04-27 NOTE — Telephone Encounter (Signed)
Last OV 11/19/18 Next OV NA Last RF 03/11/19

## 2019-06-09 ENCOUNTER — Ambulatory Visit
Admission: RE | Admit: 2019-06-09 | Discharge: 2019-06-09 | Disposition: A | Discharge status: Home / Self Care | Payer: 59 | Source: Ambulatory Visit | Attending: Hematology and Oncology | Admitting: Hematology and Oncology

## 2019-06-09 ENCOUNTER — Other Ambulatory Visit: Payer: Self-pay | Admitting: Hematology and Oncology

## 2019-06-09 ENCOUNTER — Other Ambulatory Visit: Payer: Self-pay

## 2019-06-09 DIAGNOSIS — N6320 Unspecified lump in the left breast, unspecified quadrant: Secondary | ICD-10-CM

## 2019-06-09 DIAGNOSIS — Z170 Estrogen receptor positive status [ER+]: Secondary | ICD-10-CM

## 2019-06-09 DIAGNOSIS — C50412 Malignant neoplasm of upper-outer quadrant of left female breast: Secondary | ICD-10-CM

## 2019-06-09 DIAGNOSIS — N6310 Unspecified lump in the right breast, unspecified quadrant: Secondary | ICD-10-CM

## 2019-06-10 ENCOUNTER — Other Ambulatory Visit: Payer: Self-pay | Admitting: Internal Medicine

## 2019-06-10 NOTE — Telephone Encounter (Signed)
Last OV 11/19/18 Next OV NA Last RF 04/27/19

## 2019-07-20 ENCOUNTER — Other Ambulatory Visit: Payer: Self-pay | Admitting: Internal Medicine

## 2019-07-20 NOTE — Telephone Encounter (Signed)
Check [place] registry last filled 06/10/2019../[email or web address]  

## 2019-08-19 ENCOUNTER — Other Ambulatory Visit: Payer: Self-pay | Admitting: Internal Medicine

## 2019-08-20 NOTE — Telephone Encounter (Signed)
Last OV 11/19/18 Next OV NA Last RF 07/20/2019

## 2019-09-28 ENCOUNTER — Other Ambulatory Visit: Payer: Self-pay | Admitting: Internal Medicine

## 2019-09-29 NOTE — Telephone Encounter (Signed)
Check [place] registry last filled 08/20/2019..Natasha Torres

## 2019-10-31 ENCOUNTER — Other Ambulatory Visit: Payer: Self-pay | Admitting: Internal Medicine

## 2019-11-03 NOTE — Telephone Encounter (Signed)
Last OV 11/19/18 Next OV NA Last RF 09/29/19

## 2019-11-30 ENCOUNTER — Ambulatory Visit: Payer: 59 | Admitting: Hematology and Oncology

## 2019-12-06 NOTE — Progress Notes (Signed)
HEMATOLOGY-ONCOLOGY DOXIMITY VISIT PROGRESS NOTE  I connected with Natasha Torres on 12/07/2019 at 11:00 AM EDT by Doximity video conference and verified that I am speaking with the correct person using two identifiers.  I discussed the limitations, risks, security and privacy concerns of performing an evaluation and management service by Doximity and the availability of in person appointments.  I also discussed with the patient that there may be a patient responsible charge related to this service. The patient expressed understanding and agreed to proceed.  Patient's Location: Home Physician Location: Clinic  CHIEF COMPLIANT: Follow-up of left breast cancer on tamoxifen therapy  INTERVAL HISTORY: Natasha Torres is a 39 y.o. female with above-mentioned history of left breast cancer treated with neoadjuvant chemotherapy, bilateral mastectomies, and who is currently on anti-estrogen therapy with tamoxifen. [place] of bilateral breasts on 06/09/19 showed probable areas of fat necrosis. She presents over Doximity today for annual follow-up.   Oncology History  Breast cancer of upper-outer quadrant of left female breast ([place])  08/13/2014 Mammogram   Left breast: 2 cm circumscribed mass   08/13/2014 Breast US   Left breast: two masses: #1: 1:00 10 x 9 x 13 mmm irregular; #2: 2:00: 16 x 7 x 14 mm; no LAD   08/27/2014 Initial Diagnosis   O/S excisional biopsy: 2 masses showing 1.5 cm and 0.9 cm IDC, Grade 3, ER + (90%), PR- (0%), HER-2 positive (ratio 2.5), Ki67 ~30%, Multifocal   08/27/2014 Clinical Stage   Stage IIA/IIB: T2/T3 N0   09/17/2014 Procedure   MyRisk panel (Myriad) 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 Otherwise negative at APC, ATM, BARD1, BMPR1A, BRCA1, BRCA2, BRIP1, CHD1, CDK4, CDKN2A, CHEK2, MLH1, MSH2, MSH6, MUTYH, NBN, PALB2, PMS2, PTEN, RAD51C, RAD51D, SMAD4, STK11, and TP53   09/20/2014 Breast MRI   RIGHT: 1 x 0.8 x 0.9 cm lobulated border mass in the retroareolar lower slight  lateral area. LEFT: hematoma with surrounding adjacent enhancement encompassing a 4.6 x 4.9 x 4.1 cm area.    09/27/2014 - 01/10/2015 Neo-Adjuvant Chemotherapy   Neoadjuvant TCH Perjeta every 3 week 6 followed by Herceptin maintenance completed 09/21/2015   01/13/2015 Breast MRI   Postsurgical changes in left breast without residual enhancing masses compatible with treatment response   02/10/2015 Definitive Surgery   Bilateral mastectomies: RIGHT: benign.  LEFT: complete path response;  0/3 sentinel nodes   02/10/2015 Pathologic Stage   ypT0 ypN0 ypM0   02/22/2015 -  Anti-estrogen oral therapy   Zoladex with tamoxifen, stopped Zoladex for intolerance, decreased tamoxifen to 10 mg daily.   11/17/2015 Survivorship   Survivorship care plan completed and given to patient    Observations/Objective:  There were no vitals filed for this visit. There is no height or weight on file to calculate BMI.  I have reviewed the data as listed CMP Latest Ref Rng & Units 11/19/2018 11/20/2016 05/30/2016  Glucose 70 - 99 mg/dL 90 84 88  BUN 6 - 23 mg/dL 17 11.8 17  Creatinine 0.40 - 1.20 mg/dL 0.79 0.8 0.68  Sodium 135 - 145 mEq/L 138 140 140  Potassium 3.5 - 5.1 mEq/L 4.1 4.3 4.6  Chloride 96 - 112 mEq/L 106 - 107  CO2 19 - 32 mEq/L 25 24 27   Calcium 8.4 - 10.5 mg/dL 8.9 9.6 9.2  Total Protein 6.0 - 8.3 g/dL 7.0 6.9 6.9  Total Bilirubin 0.2 - 1.2 mg/dL 0.4 0.44 0.2  Alkaline Phos 39 - 117 U/L 36(L) 51 64  AST 0 - 37 U/L  23 29 18   ALT 0 - 35 U/L 14 15 10     Lab Results  Component Value Date   WBC 4.1 11/19/2018   HGB 12.6 11/19/2018   HCT 37.6 11/19/2018   MCV 91.5 11/19/2018   PLT 179.0 11/19/2018   NEUTROABS 2.0 11/19/2018      Assessment Plan:  Breast cancer of upper-outer quadrant of left female breast 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 Biopsy of the right breast mass and left axillary lymph node and the secondary mass left breast back as benign. Status post neo-adjuvant TCH Perjeta 6 cycles from 09/27/2014 to 01/10/2015 Followed by bilateral mastectomies on 02/10/2015: No malignancy in either breast Herceptin maintenance completed 09/21/2015 ----------------------------------------------------------------------------------------------------------------------------------------------------- Current treatment: Zoladex with tamoxifen started 02/22/2015 Stopped 11/17/2015 due to musculoskeletal aches and pains; Now on Tamoxifen alone restarted Mar 18, 2016  Tamoxifen toxicities: 1. Hot flashesresolved after we stopped Zoladex 2. Depression:mild to moderate depression  3. Arthralgias and myalgias: Patient stopped tamoxifen June 2017, restarted 03/18/2016.Currently on10 mg of tamoxifen daily from 05/21/2016.  Weight issues:  Chronic fatigue syndrome. Anxiety and emotional issues: She takes Ativan very occasionally. I renewed her prescription today.  Breast cancer surveillance: 1.  Breast examination no palpable lumps or nodules 2. no role of mammogram since she had bilateral mastectomies   ATM mutation: We will refer her to GI for pancreatic and colon cancer surveillance. Holistic care: Patient has been eating better and stop alcohol consumption.  She has lost 10 to 15 pounds and is feeling great. She plans to spend her 51th birthday in [place] on vacation. Her daughter is now 73 years old.  Return to clinic in1 yearfor follow-up.  I discussed the assessment and treatment plan with the patient. The patient was provided an opportunity to ask questions and all were answered. The patient agreed with the plan and demonstrated an understanding of the  instructions. The patient was advised to call back or seek an in-person evaluation if the symptoms worsen or if the condition fails to improve as anticipated.   I provided 30 minutes of face-to-face Doximity time during this encounter.    Rulon Eisenmenger, MD 12/07/2019   I, Molly Dorshimer, am acting as scribe for Nicholas Lose, MD.  I have reviewed the above documentation for accuracy and completeness, and I agree with the above.

## 2019-12-07 ENCOUNTER — Inpatient Hospital Stay: Payer: 59 | Attending: Hematology and Oncology | Admitting: Hematology and Oncology

## 2019-12-07 DIAGNOSIS — C50412 Malignant neoplasm of upper-outer quadrant of left female breast: Secondary | ICD-10-CM

## 2019-12-07 DIAGNOSIS — G113 Cerebellar ataxia with defective DNA repair: Secondary | ICD-10-CM

## 2019-12-07 DIAGNOSIS — Z170 Estrogen receptor positive status [ER+]: Secondary | ICD-10-CM

## 2019-12-07 MED ORDER — TAMOXIFEN CITRATE 10 MG PO TABS: 10.0000 mg | ORAL_TABLET | Freq: Every day | ORAL | 3 refills | Status: DC

## 2019-12-07 MED ORDER — MAGNESIUM 200 MG PO TABS: 1.0000 | ORAL_TABLET | Freq: Every day | ORAL | Status: DC

## 2019-12-07 MED ORDER — LORAZEPAM 0.5 MG PO TABS: 0.5000 mg | ORAL_TABLET | Freq: Every evening | ORAL | 3 refills | Status: DC | PRN

## 2019-12-07 MED ORDER — TURMERIC 500 MG PO CAPS: 1.0000 | ORAL_CAPSULE | Freq: Every day | ORAL | Status: DC

## 2019-12-07 NOTE — Assessment & Plan Note (Signed)
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 Biopsy of the right breast mass and left axillary lymph node and the secondary mass left breast back as benign. Status post neo-adjuvant TCH Perjeta 6 cycles from 09/27/2014 to 01/10/2015 Followed by bilateral mastectomies on 02/10/2015: No malignancy in either breast Herceptin maintenance completed 09/21/2015 ----------------------------------------------------------------------------------------------------------------------------------------------------- Current treatment: Zoladex with tamoxifen started 02/22/2015 Stopped 11/17/2015 due to musculoskeletal aches and pains; Now on Tamoxifen alone restarted Mar 18, 2016  Tamoxifen toxicities: 1. Hot flashesresolved after we stopped Zoladex 2. Depression:mild to moderate depression  3. Arthralgias and myalgias: Patient stopped tamoxifen June 2017, restarted 03/18/2016.Currently on10 mg of tamoxifen daily from 05/21/2016.  Weight issues:  Chronic fatigue syndrome:Onvitamin B-12 injection sinceDecember 2017. Anxiety and emotional issues: She takes Ativan very occasionally. I renewed her prescription today.  Breast cancer surveillance: 1.  Breast examination no palpable lumps or nodules 2. no role of mammogram since she had bilateral mastectomies  Return to clinic in1 yearfor follow-up.

## 2019-12-14 ENCOUNTER — Other Ambulatory Visit: Payer: Self-pay | Admitting: Hematology and Oncology

## 2019-12-14 ENCOUNTER — Other Ambulatory Visit: Payer: Self-pay

## 2019-12-14 ENCOUNTER — Ambulatory Visit
Admission: RE | Admit: 2019-12-14 | Discharge: 2019-12-14 | Disposition: A | Discharge status: Home / Self Care | Payer: 59 | Source: Ambulatory Visit | Attending: Hematology and Oncology | Admitting: Hematology and Oncology

## 2019-12-14 DIAGNOSIS — C50412 Malignant neoplasm of upper-outer quadrant of left female breast: Secondary | ICD-10-CM

## 2019-12-14 DIAGNOSIS — N6310 Unspecified lump in the right breast, unspecified quadrant: Secondary | ICD-10-CM

## 2019-12-14 DIAGNOSIS — Z170 Estrogen receptor positive status [ER+]: Secondary | ICD-10-CM

## 2019-12-14 DIAGNOSIS — N6320 Unspecified lump in the left breast, unspecified quadrant: Secondary | ICD-10-CM

## 2020-04-27 ENCOUNTER — Other Ambulatory Visit: Payer: Self-pay | Admitting: Hematology and Oncology

## 2020-06-16 ENCOUNTER — Other Ambulatory Visit: Payer: 59

## 2020-06-29 ENCOUNTER — Other Ambulatory Visit: Payer: 59

## 2020-08-10 ENCOUNTER — Ambulatory Visit
Admission: RE | Admit: 2020-08-10 | Discharge: 2020-08-10 | Disposition: A | Discharge status: Home / Self Care | Payer: BC Managed Care – PPO | Source: Ambulatory Visit | Attending: Hematology and Oncology | Admitting: Hematology and Oncology

## 2020-08-10 ENCOUNTER — Other Ambulatory Visit: Payer: Self-pay | Admitting: Hematology and Oncology

## 2020-08-10 ENCOUNTER — Other Ambulatory Visit: Payer: Self-pay

## 2020-08-10 DIAGNOSIS — Z170 Estrogen receptor positive status [ER+]: Secondary | ICD-10-CM

## 2020-08-10 DIAGNOSIS — C50412 Malignant neoplasm of upper-outer quadrant of left female breast: Secondary | ICD-10-CM

## 2020-08-10 DIAGNOSIS — N6320 Unspecified lump in the left breast, unspecified quadrant: Secondary | ICD-10-CM

## 2020-08-10 DIAGNOSIS — N6310 Unspecified lump in the right breast, unspecified quadrant: Secondary | ICD-10-CM

## 2020-08-12 ENCOUNTER — Ambulatory Visit
Admission: RE | Admit: 2020-08-12 | Discharge: 2020-08-12 | Disposition: A | Discharge status: Home / Self Care | Payer: BC Managed Care – PPO | Source: Ambulatory Visit | Attending: Hematology and Oncology | Admitting: Hematology and Oncology

## 2020-08-12 ENCOUNTER — Other Ambulatory Visit: Payer: Self-pay

## 2020-08-12 ENCOUNTER — Other Ambulatory Visit: Payer: Self-pay | Admitting: Hematology and Oncology

## 2020-08-12 DIAGNOSIS — N6320 Unspecified lump in the left breast, unspecified quadrant: Secondary | ICD-10-CM

## 2020-08-12 DIAGNOSIS — Z170 Estrogen receptor positive status [ER+]: Secondary | ICD-10-CM

## 2020-08-12 DIAGNOSIS — C50412 Malignant neoplasm of upper-outer quadrant of left female breast: Secondary | ICD-10-CM

## 2020-08-12 HISTORY — PX: BREAST BIOPSY: SHX20

## 2020-08-15 ENCOUNTER — Encounter: Payer: Self-pay | Admitting: *Deleted

## 2020-08-15 ENCOUNTER — Other Ambulatory Visit: Payer: Self-pay | Admitting: *Deleted

## 2020-08-15 ENCOUNTER — Telehealth: Payer: Self-pay | Admitting: *Deleted

## 2020-08-15 DIAGNOSIS — Z170 Estrogen receptor positive status [ER+]: Secondary | ICD-10-CM

## 2020-08-15 DIAGNOSIS — C50412 Malignant neoplasm of upper-outer quadrant of left female breast: Secondary | ICD-10-CM

## 2020-08-15 MED ORDER — LORAZEPAM 0.5 MG PO TABS
0.5000 mg | ORAL_TABLET | Freq: Four times a day (QID) | ORAL | 3 refills | Status: DC | PRN
Start: 2020-08-15 — End: 2021-02-07

## 2020-08-15 NOTE — Telephone Encounter (Signed)
Discussed CT/bone scan and MRI breast per Dr. Lindi Adie. Gave instructions and confirmed appts. No further needs voiced.

## 2020-08-15 NOTE — Telephone Encounter (Signed)
Spoke to pt concerning new dx of recurrent breast cancer. Gave emotional support. Scheduled and confirmed appt to see Dr. Lindi Adie on 08/22/20 at 1pm to further discuss tx plan. Refer to SW Encourage pt to call with needs. Received verbal understanding.

## 2020-08-17 ENCOUNTER — Other Ambulatory Visit: Payer: Self-pay

## 2020-08-17 ENCOUNTER — Ambulatory Visit
Admission: RE | Admit: 2020-08-17 | Discharge: 2020-08-17 | Disposition: A | Discharge status: Home / Self Care | Payer: BC Managed Care – PPO | Source: Ambulatory Visit | Attending: Hematology and Oncology | Admitting: Hematology and Oncology

## 2020-08-17 DIAGNOSIS — Z170 Estrogen receptor positive status [ER+]: Secondary | ICD-10-CM

## 2020-08-17 DIAGNOSIS — C50412 Malignant neoplasm of upper-outer quadrant of left female breast: Secondary | ICD-10-CM

## 2020-08-17 MED ORDER — GADOBUTROL 1 MMOL/ML IV SOLN
8.0000 mL | Freq: Once | INTRAVENOUS | Status: AC | PRN
  Administered 2020-08-17: 8 mL via INTRAVENOUS

## 2020-08-18 ENCOUNTER — Encounter: Payer: Self-pay | Admitting: *Deleted

## 2020-08-21 NOTE — Assessment & Plan Note (Addendum)
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 Neo adj chemo with TCHP foll by Herceptin maintenance (path CR: Bil mastectomies and reconstruction), Tamoxifen (stoped and started due to intolerance) ATM gene mutation  08/12/20 Relapsed Breast Cancer: Left Breast UOQ 2.1 cm and 1.5 cm. Indeterminate 1.8 cm and Right breast indeterminate 1.5 cm.  Biopsy revealed IDC with DCIS, ER 80%, PR 0%, HER-2 3+ IHC positive  Bone scans 08/22/2020: Results awaited CT scans: scheduled for 08/24/20  Treatment plan: Assuming no distant metastatic disease 1.  Surgical resection of the recurrent tumors 2. adjuvant chemotherapy with dose dense Adriamycin and Cytoxan followed by Kadcyla 3.  Adjuvant radiation therapy (we will have to discuss the pros and cons of radiation given ATM mutation) 4.  Further adjuvant antiestrogen therapy (ovarian suppression or oophorectomy plus anastrozole)  She is seeing Dr. Donne Hazel in a couple of days. Return to clinic after surgery to discuss final treatment plan.

## 2020-08-22 ENCOUNTER — Inpatient Hospital Stay: Payer: BC Managed Care – PPO | Admitting: Licensed Clinical Social Worker

## 2020-08-22 ENCOUNTER — Encounter (HOSPITAL_COMMUNITY)
Admission: RE | Admit: 2020-08-22 | Discharge: 2020-08-22 | Disposition: A | Discharge status: Home / Self Care | Payer: BC Managed Care – PPO | Source: Ambulatory Visit | Attending: Hematology and Oncology | Admitting: Hematology and Oncology

## 2020-08-22 ENCOUNTER — Encounter: Payer: Self-pay | Admitting: *Deleted

## 2020-08-22 ENCOUNTER — Other Ambulatory Visit: Payer: Self-pay

## 2020-08-22 ENCOUNTER — Other Ambulatory Visit: Payer: Self-pay | Admitting: Hematology and Oncology

## 2020-08-22 ENCOUNTER — Inpatient Hospital Stay: Payer: BC Managed Care – PPO | Attending: Hematology and Oncology | Admitting: Hematology and Oncology

## 2020-08-22 ENCOUNTER — Other Ambulatory Visit: Payer: Self-pay | Admitting: *Deleted

## 2020-08-22 DIAGNOSIS — Z170 Estrogen receptor positive status [ER+]: Secondary | ICD-10-CM | POA: Diagnosis present

## 2020-08-22 DIAGNOSIS — C50412 Malignant neoplasm of upper-outer quadrant of left female breast: Secondary | ICD-10-CM | POA: Diagnosis present

## 2020-08-22 DIAGNOSIS — Z923 Personal history of irradiation: Secondary | ICD-10-CM | POA: Insufficient documentation

## 2020-08-22 DIAGNOSIS — Z79899 Other long term (current) drug therapy: Secondary | ICD-10-CM | POA: Insufficient documentation

## 2020-08-22 DIAGNOSIS — Z9221 Personal history of antineoplastic chemotherapy: Secondary | ICD-10-CM | POA: Insufficient documentation

## 2020-08-22 DIAGNOSIS — Z79810 Long term (current) use of selective estrogen receptor modulators (SERMs): Secondary | ICD-10-CM | POA: Insufficient documentation

## 2020-08-22 MED ORDER — TECHNETIUM TC 99M MEDRONATE IV KIT
19.5000 | PACK | Freq: Once | INTRAVENOUS | Status: AC | PRN
  Administered 2020-08-22: 19.5 via INTRAVENOUS

## 2020-08-22 NOTE — Progress Notes (Signed)
[place] Work  Clinical Social Work was referred by Engineer, site for emotional and coping support.  Clinical Social Worker met with patient  to offer support and assess for needs.    Patient is dealing with recurrence diagnosis after being over 5 years out from her previous breast cancer. This time includes additional stressors of being a single mom (shares custody with ex whom she has a good relationship with), her daughter being 41 yo and aware of what is going on, and her dad going through his own cancer treatment.  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   Patient is trying to stay positive but has normal worries, fears, and anger at recurrence diagnosis. CSW normalized feelings and patient began processing with tears at times.  CSW and patient also briefly discussed ways to talk to your child about diagnosis and she is planning to do this in the next week or so. Provided [employer] [facility] [employer] for assistance.  CSW will continue to meet with patient for support as she goes through treatment. Patient will notify CSW if she would like to be referred for more regular counseling.   [place], [place] Worker [employer]

## 2020-08-22 NOTE — Progress Notes (Signed)
Patient Care Team: [name], MD as PCP - General (Internal Medicine) Nicholas Lose, MD as Consulting Physician (Hematology and Oncology) Rolm Bookbinder, MD as Consulting Physician (General Surgery) Irene Limbo, MD as Consulting Physician (Plastic Surgery) Rockwell Germany, RN as Registered Nurse Mauro Kaufmann, RN as Registered Nurse Sylvan Cheese, NP as Nurse Practitioner (Hematology and Oncology) Paula Compton, MD as Consulting Physician (Obstetrics and Gynecology)  DIAGNOSIS:    ICD-10-CM   1. Malignant neoplasm of upper-outer quadrant of left breast in female, estrogen receptor positive ([place])  C50.412    Z17.0     SUMMARY OF ONCOLOGIC HISTORY: Oncology History  Breast cancer of upper-outer quadrant of left female breast ([place])  08/13/2014 Mammogram   Left breast: 2 cm circumscribed mass   08/13/2014 Breast US   Left breast: two masses: #1: 1:00 10 x 9 x 13 mmm irregular; #2: 2:00: 16 x 7 x 14 mm; no LAD   08/27/2014 Initial Diagnosis   O/S excisional biopsy: 2 masses showing 1.5 cm and 0.9 cm IDC, Grade 3, ER + (90%), PR- (0%), HER-2 positive (ratio 2.5), Ki67 ~30%, Multifocal   08/27/2014 Clinical Stage   Stage IIA/IIB: T2/T3 N0   09/17/2014 Procedure   MyRisk panel (Myriad) 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 Otherwise negative at APC, ATM, BARD1, BMPR1A, BRCA1, BRCA2, BRIP1, CHD1, CDK4, CDKN2A, CHEK2, MLH1, MSH2, MSH6, MUTYH, NBN, PALB2, PMS2, PTEN, RAD51C, RAD51D, SMAD4, STK11, and TP53   09/20/2014 Breast MRI   RIGHT: 1 x 0.8 x 0.9 cm lobulated border mass in the retroareolar lower slight lateral area. LEFT: hematoma with surrounding adjacent enhancement encompassing a 4.6 x 4.9 x 4.1 cm area.    09/27/2014 - 01/10/2015 Neo-Adjuvant Chemotherapy   Neoadjuvant TCH Perjeta every 3 week 6 followed by Herceptin maintenance completed 09/21/2015   01/13/2015 Breast MRI   Postsurgical changes in left breast without residual enhancing masses  compatible with treatment response   02/10/2015 Definitive Surgery   Bilateral mastectomies: RIGHT: benign.  LEFT: complete path response;  0/3 sentinel nodes   02/10/2015 Pathologic Stage   ypT0 ypN0 ypM0   02/22/2015 -  Anti-estrogen oral therapy   Zoladex with tamoxifen, stopped Zoladex for intolerance, decreased tamoxifen to 10 mg daily.   11/17/2015 Survivorship   Survivorship care plan completed and given to patient    Relapse/Recurrence   Within the upper-outer left breast anterior depth there are 2 adjacent irregular enhancing masses (2.1 cm and 1.5 cm) compatible with biopsy-proven malignancy. Indeterminate enhancing mass within the outer left breast posterior depth (1.8 cm). Indeterminate enhancing mass within the outer lower posterior right breast. 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 ER 80%, PR 0%, Her 2 Positive     CHIEF COMPLIANT: Follow-up for left breast cancer recurrence  INTERVAL HISTORY: Natasha Torres is a 40 y.o. with above-mentioned history of left breast cancer treated with neoadjuvant chemotherapy, bilateral mastectomies, and who is currently on anti-estrogen therapy with tamoxifen.[place] on 08/10/20 showed a stable, probably benign 3.0cm mass at the 10 o'clock position in the right breast, and in the left breast, multiple masses in the 2-4 o'clock region, 0.6cm, 0.4cm, and 0.2cm. Biopsy of the left breast on 08/12/20 showed invasive and in situ mammary carcinoma, grade 3, HER-2 positive (3+), ER+ 80%, PR- 0%, Ki67 50%. Breast MRI on 08/17/20 showed the known left breast malignancies in addition to a mass in the posterior left breast, 1.5cm, and an  indeterminate mass in the outer lower right breast, 1.5cm. She presents to the clinic todayto discuss treatment options.  Patient has palpable abnormalities on the left breast lateral aspect of the areola  ALLERGIES:  is allergic to buprenorphine hcl, morphine and related, other,  penicillins, and tegaderm ag mesh [silver].  MEDICATIONS:  Current Outpatient Medications  Medication Sig Dispense Refill  . cholecalciferol (VITAMIN D) 1000 units tablet Take 2,000 Units by mouth daily.    [name] LORazepam (ATIVAN) 0.5 MG tablet Take 1 tablet (0.5 mg total) by mouth every 6 (six) hours as needed for anxiety. 30 tablet 3  . Magnesium 200 MG TABS Take 1 tablet (200 mg total) by mouth daily. (Patient taking differently: Take 1 tablet by mouth daily. 400 mg) 30 tablet   . Multiple Vitamin (MULTIVITAMIN) tablet Take 1 tablet by mouth daily.    . Omega-3 Fatty Acids (FISH OIL PO) Take 1,200 mg by mouth daily.    . tamoxifen (NOLVADEX) 10 MG tablet Take 1 tablet (10 mg total) by mouth daily. 90 tablet 3  . Turmeric 500 MG CAPS Take 1 capsule by mouth daily.    [name] venlafaxine XR (EFFEXOR XR) 37.5 MG 24 hr capsule Take 1 capsule (37.5 mg total) by mouth daily with breakfast. 30 capsule 11   No current facility-administered medications for this visit.    PHYSICAL EXAMINATION: ECOG PERFORMANCE STATUS: 1 - Symptomatic but completely ambulatory  Vitals:   08/22/20 1259  BP: 128/75  Pulse: 97  Resp: 20  Temp: 97.9 F (36.6 C)  SpO2: 100%   Filed Weights   08/22/20 1259  Weight: 166 lb 6.4 oz (75.5 kg)    BREAST: Bilateral reconstructed breasts, palpable abnormalities lateral to the left areola (exam performed in the presence of a chaperone)  LABORATORY DATA:  I have reviewed the data as listed CMP Latest Ref Rng & Units 11/19/2018 11/20/2016 05/30/2016  Glucose 70 - 99 mg/dL 90 84 88  BUN 6 - 23 mg/dL 17 11.8 17  Creatinine 0.40 - 1.20 mg/dL 0.79 0.8 0.68  Sodium 135 - 145 mEq/L 138 140 140  Potassium 3.5 - 5.1 mEq/L 4.1 4.3 4.6  Chloride 96 - 112 mEq/L 106 - 107  CO2 19 - 32 mEq/L _0 Calcium 8.4 - 10.5 mg/dL 8.9 9.6 9.2  Total Protein 6.0 - 8.3 g/dL 7.0 6.9 6.9  Total Bilirubin 0.2 - 1.2 mg/dL 0.4 0.44 0.2  Alkaline Phos 39 - 117 U/L 36(L) 51 64  AST 0 - 37 U/L _1 ALT 0 - 35 U/L _2 Lab Results  Component Value Date   WBC 4.1 11/19/2018   HGB 12.6 11/19/2018   HCT 37.6 11/19/2018   MCV 91.5 11/19/2018   PLT 179.0 11/19/2018   NEUTROABS 2.0 11/19/2018    ASSESSMENT & PLAN:  Breast cancer of upper-outer quadrant of left female breast 08/13/2014: Left breast IDC. Neo adj chemo with TCHP foll by Herceptin maintenance (path CR: Bil mastectomies and reconstruction), Tamoxifen (stoped and started due to intolerance) ATM gene mutation  08/12/20 Relapsed Breast Cancer: Left Breast UOQ 2.1 cm and 1.5 cm. Indeterminate 1.8 cm and Right breast indeterminate 1.5 cm.  Biopsy revealed IDC with DCIS, ER 80%, PR 0%, HER-2 3+ IHC positive  Bone scans 08/22/2020: Results awaited CT scans: scheduled for 08/24/20  Treatment plan: Assuming no distant metastatic disease 1.  Surgical resection of the recurrent tumors 2. adjuvant chemotherapy with dose dense  Adriamycin and Cytoxan followed by Kadcyla 3.  Adjuvant radiation therapy (we will have to discuss the pros and cons of radiation given ATM mutation) 4.  Further adjuvant antiestrogen therapy (ovarian suppression or oophorectomy plus anastrozole)  Chemo counseling: Discussed the pros and cons of Adriamycin and Cytoxan as well as Kadcyla.  She is seeing Dr. Donne Hazel in a couple of days. Return to clinic after surgery to discuss final treatment plan.    No orders of the defined types were placed in this encounter.  The patient has a good understanding of the overall plan. she agrees with it. she will call with any problems that may develop before the next visit here.  Total time spent: 45 mins including face to face time and time spent for planning, charting and coordination of care  Rulon Eisenmenger, MD, MPH 08/22/2020  I, Cloyde Reams Dorshimer, am acting as scribe for Dr. Nicholas Lose.  I have reviewed the above documentation for accuracy and completeness, and I agree with the above.

## 2020-08-23 ENCOUNTER — Encounter: Payer: Self-pay | Admitting: Hematology and Oncology

## 2020-08-23 ENCOUNTER — Encounter: Payer: Self-pay | Admitting: *Deleted

## 2020-08-24 ENCOUNTER — Ambulatory Visit (HOSPITAL_COMMUNITY)
Admission: RE | Admit: 2020-08-24 | Discharge: 2020-08-24 | Disposition: A | Discharge status: Home / Self Care | Payer: BC Managed Care – PPO | Source: Ambulatory Visit | Attending: Hematology and Oncology | Admitting: Hematology and Oncology

## 2020-08-24 ENCOUNTER — Other Ambulatory Visit: Payer: Self-pay

## 2020-08-24 ENCOUNTER — Encounter (HOSPITAL_COMMUNITY): Payer: Self-pay

## 2020-08-24 DIAGNOSIS — C50412 Malignant neoplasm of upper-outer quadrant of left female breast: Secondary | ICD-10-CM | POA: Insufficient documentation

## 2020-08-24 DIAGNOSIS — Z170 Estrogen receptor positive status [ER+]: Secondary | ICD-10-CM | POA: Insufficient documentation

## 2020-08-24 HISTORY — DX: Malignant neoplasm of unspecified site of left female breast: C50.912

## 2020-08-24 MED ORDER — IOHEXOL 300 MG/ML  SOLN
100.0000 mL | Freq: Once | INTRAMUSCULAR | Status: AC | PRN
  Administered 2020-08-24: 100 mL via INTRAVENOUS

## 2020-08-25 ENCOUNTER — Telehealth: Payer: Self-pay | Admitting: Hematology and Oncology

## 2020-08-25 ENCOUNTER — Ambulatory Visit
Admission: RE | Admit: 2020-08-25 | Discharge: 2020-08-25 | Disposition: A | Discharge status: Home / Self Care | Payer: BC Managed Care – PPO | Source: Ambulatory Visit | Attending: Hematology and Oncology | Admitting: Hematology and Oncology

## 2020-08-25 ENCOUNTER — Other Ambulatory Visit: Payer: Self-pay | Admitting: Hematology and Oncology

## 2020-08-25 ENCOUNTER — Encounter: Payer: Self-pay | Admitting: *Deleted

## 2020-08-25 ENCOUNTER — Other Ambulatory Visit: Payer: Self-pay | Admitting: *Deleted

## 2020-08-25 ENCOUNTER — Other Ambulatory Visit: Payer: Self-pay | Admitting: General Surgery

## 2020-08-25 DIAGNOSIS — C50412 Malignant neoplasm of upper-outer quadrant of left female breast: Secondary | ICD-10-CM

## 2020-08-25 DIAGNOSIS — Z170 Estrogen receptor positive status [ER+]: Secondary | ICD-10-CM

## 2020-08-25 NOTE — Telephone Encounter (Signed)
Scheduled appt per 3/10 sch msg. Pt aware.  

## 2020-08-26 ENCOUNTER — Inpatient Hospital Stay: Payer: BC Managed Care – PPO

## 2020-08-26 ENCOUNTER — Other Ambulatory Visit: Payer: Self-pay

## 2020-08-26 ENCOUNTER — Other Ambulatory Visit: Payer: Self-pay | Admitting: Hematology and Oncology

## 2020-08-26 ENCOUNTER — Ambulatory Visit (HOSPITAL_COMMUNITY)
Admission: RE | Admit: 2020-08-26 | Discharge: 2020-08-26 | Disposition: A | Discharge status: Home / Self Care | Payer: BC Managed Care – PPO | Source: Ambulatory Visit | Attending: Hematology and Oncology | Admitting: Hematology and Oncology

## 2020-08-26 DIAGNOSIS — Z170 Estrogen receptor positive status [ER+]: Secondary | ICD-10-CM | POA: Diagnosis not present

## 2020-08-26 DIAGNOSIS — C50412 Malignant neoplasm of upper-outer quadrant of left female breast: Secondary | ICD-10-CM

## 2020-08-26 DIAGNOSIS — Z01818 Encounter for other preprocedural examination: Secondary | ICD-10-CM | POA: Diagnosis present

## 2020-08-26 DIAGNOSIS — Z0189 Encounter for other specified special examinations: Secondary | ICD-10-CM | POA: Diagnosis not present

## 2020-08-26 LAB — ECHOCARDIOGRAM COMPLETE
Area-P 1/2: 3.53 cm2
S' Lateral: 3.5 cm

## 2020-08-26 NOTE — Progress Notes (Signed)
  Echocardiogram 2D Echocardiogram has been performed.  Natasha Torres 08/26/2020, 11:05 AM

## 2020-08-29 ENCOUNTER — Encounter: Payer: Self-pay | Admitting: *Deleted

## 2020-08-30 NOTE — Progress Notes (Addendum)
COVID Vaccine Completed: x3 Date COVID Vaccine completed: Has received booster: 11-21 COVID vaccine manufacturer: [place]   Date of COVID positive in last 90 days: N/A  PCP - Billey Gosling, MD Cardiologist - Loralie Champagne, MD    Chest x-ray - CT chest 08-25-20 Epic EKG - N/A Stress Test -  ECHO - 08-26-20 Epic Cardiac Cath -  Pacemaker/ICD device last checked: Spinal Cord Stimulator:  Sleep Study - N/A CPAP -   Fasting Blood Sugar - N/A Checks Blood Sugar _____ times a day  Blood Thinner Instructions:  N/A Aspirin Instructions: Last Dose:  Activity level:  Can go up a flight of stairs and perform activities of daily living without stopping and without symptoms of chest pain or shortness of breath.  Able to exercise without symptoms     Anesthesia review:  Hx of breast cancer and chemo with decrease in EF, followed by cardiology.  Patient denies shortness of breath, fever, cough and chest pain at PAT appointment   Patient verbalized understanding of instructions that were given to them at the PAT appointment. Patient was also instructed that they will need to review over the PAT instructions again at home before surgery.

## 2020-08-30 NOTE — Patient Instructions (Addendum)
DUE TO COVID-19 ONLY ONE VISITOR IS ALLOWED TO COME WITH YOU AND STAY IN THE WAITING ROOM ONLY DURING PRE OP AND PROCEDURE.      COVID SWAB TESTING MUST BE COMPLETED ON:  Friday, 09-02-20 @ 2:05 PM   [address] [place], [place] [address]  (Must self quarantine after testing. Follow instructions on handout.)       Your procedure is scheduled on: Tuesday, 09-06-20   Report to [facility] Main  Entrance    Report to admitting at 7:00 AM   Call this number if you have problems the morning of surgery [phone]   Do not eat food :After Midnight.   May have liquids until 6:00 AM day of surgery  CLEAR LIQUID DIET  Foods Allowed                                                                     Foods Excluded  Water, Black Coffee and tea, regular and decaf              liquids that you cannot  Plain Jell-O in any flavor  (No red)                                    see through such as: Fruit ices (not with fruit pulp)                                      milk, soups, orange juice              Iced Popsicles (No red)                                      All solid food                                   Apple juices Sports drinks like Gatorade (No red) Lightly seasoned clear broth or consume(fat free) Sugar, honey syrup    Complete one Ensure drink the morning of surgery at 6:00 AM  the day of surgery.     1. The day of surgery:  ? Drink ONE (1) Pre-Surgery Clear Ensure or G2 by am the morning of surgery. Drink in one sitting. Do not sip.  ? This drink was given to you during your hospital  pre-op appointment visit. ? Nothing else to drink after completing the  Pre-Surgery Clear Ensure or G2.          If you have questions, please contact your surgeon's office.     Oral Hygiene is also important to reduce your risk of infection.                                    Remember - BRUSH YOUR TEETH THE MORNING OF SURGERY WITH YOUR REGULAR TOOTHPASTE   Do NOT smoke after  Midnight   Take these medicines the morning of surgery with A SIP OF WATER: Lorazepam if needed                              You may not have any metal on your body including hair pins, jewelry, and body piercings             Do not wear make-up, lotions, powders, perfumes/cologne, or deodorant             Do not wear nail polish.  Do not shave  48 hours prior to surgery.           Do not bring valuables to the hospital. [place].   Contacts, dentures or bridgework may not be worn into surgery.    Patients discharged the day of surgery will not be allowed to drive home.              Please read over the following fact sheets you were given: IF YOU HAVE QUESTIONS ABOUT YOUR PRE OP INSTRUCTIONS PLEASE CALL  [facility] - Preparing for Surgery Before surgery, you can play an important role.  Because skin is not sterile, your skin needs to be as free of germs as possible.  You can reduce the number of germs on your skin by washing with CHG (chlorahexidine gluconate) soap before surgery.  CHG is an antiseptic cleaner which kills germs and bonds with the skin to continue killing germs even after washing. Please DO NOT use if you have an allergy to CHG or antibacterial soaps.  If your skin becomes reddened/irritated stop using the CHG and inform your nurse when you arrive at Short Stay. Do not shave (including legs and underarms) for at least 48 hours prior to the first CHG shower.  You may shave your face/neck.  Please follow these instructions carefully:  1.  Shower with CHG Soap the night before surgery and the  morning of surgery.  2.  If you choose to wash your hair, wash your hair first as usual with your normal  shampoo.  3.  After you shampoo, rinse your hair and body thoroughly to remove the shampoo.                             4.  Use CHG as you would any other liquid soap.  You can apply chg directly to the skin and  wash.  Gently with a scrungie or clean washcloth.  5.  Apply the CHG Soap to your body ONLY FROM THE NECK DOWN.   Do   not use on face/ open                           Wound or open sores. Avoid contact with eyes, ears mouth and   genitals (private parts).                       Wash face,  Genitals (private parts) with your normal soap.             6.  Wash thoroughly, paying special attention to the area where your    surgery  will be performed.  7.  Thoroughly  rinse your body with warm water from the neck down.  8.  DO NOT shower/wash with your normal soap after using and rinsing off the CHG Soap.                9.  Pat yourself dry with a clean towel.            10.  Wear clean pajamas.            11.  Place clean sheets on your bed the night of your first shower and do not  sleep with pets. Day of Surgery : Do not apply any lotions/deodorants the morning of surgery.  Please wear clean clothes to the hospital/surgery center.  FAILURE TO FOLLOW THESE INSTRUCTIONS MAY RESULT IN THE CANCELLATION OF YOUR SURGERY  PATIENT SIGNATURE_________________________________  NURSE SIGNATURE__________________________________  ________________________________________________________________________

## 2020-08-30 NOTE — Assessment & Plan Note (Signed)
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 Neo adj chemo with TCHP foll by Herceptin maintenance (path CR: Bil mastectomies and reconstruction), Tamoxifen (stoped and started due to intolerance) ATM gene mutation  08/12/20 Relapsed Breast Cancer: Left Breast UOQ 2.1 cm and 1.5 cm. Indeterminate 1.8 cm and Right breast indeterminate 1.5 cm.  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 Neo-adjuvant chemotherapy with dose dense Adriamycin and Cytoxan X 4 followed by Kadcyla 2. Surgery 3. Followed by XRT 4. Further adjuvant antiestrogen therapy (ovarian suppression or oophorectomy plus anastrozole) -------------------------------------------------------------------------------------------------------- Chemotherapy Counseling: I discussed the risks and benefits of chemotherapy including the risks of nausea/ vomiting, risk of infection from low WBC count, fatigue due to chemo or anemia, bruising or bleeding due to low platelets, mouth sores, loss/ change in taste and decreased appetite. Liver and kidney function will be monitored through out chemotherapy as abnormalities in liver and kidney function may be a side effect of treatment. Cardiac dysfunction due to Adriamycin was discussed in detail. Risk of permanent bone marrow dysfunction and leukemia due to chemo were also discussed.  RTC in 1 week for cycle 1

## 2020-08-30 NOTE — Progress Notes (Signed)
HEMATOLOGY-ONCOLOGY [facility] VIDEO VISIT PROGRESS NOTE  I connected with Natasha Torres on 08/31/2020 at  8:45 AM EDT by MyChart video conference and verified that I am speaking with the correct person using two identifiers.  I discussed the limitations, risks, security and privacy concerns of performing an evaluation and management service by MyChart and the availability of in person appointments.  I also discussed with the patient that there may be a patient responsible charge related to this service. The patient expressed understanding and agreed to proceed.  Patient's Location: Home Physician Location: Clinic  CHIEF COMPLIANT: Follow-up for left breast cancer recurrence  INTERVAL HISTORY: Natasha Torres is a 40 y.o. female with above-mentioned history of recurrent left breast cancer. Bone scan on 08/22/20 showed no evidence of osseous metastases. CT CAP on 08/24/20 showed the known left breast and axilla malignancies and no evidence of metastatic disease. Echo on 08/26/20 showed an ejection fraction of 50-55%. Additional biopsies on 08/25/20 showed invasive mammary carcinoma in the left breast and benign fibroadipose tissue with no evidence of malignancy in the right breast. She presentsover MyCharttodayto discuss chemotherapy.  Oncology History  Breast cancer of upper-outer quadrant of left female breast ([place])  08/13/2014 Mammogram   Left breast: 2 cm circumscribed mass   08/13/2014 Breast US   Left breast: two masses: #1: 1:00 10 x 9 x 13 mmm irregular; #2: 2:00: 16 x 7 x 14 mm; no LAD   08/27/2014 Initial Diagnosis   O/S excisional biopsy: 2 masses showing 1.5 cm and 0.9 cm IDC, Grade 3, ER + (90%), PR- (0%), HER-2 positive (ratio 2.5), Ki67 ~30%, Multifocal   08/27/2014 Clinical Stage   Stage IIA/IIB: T2/T3 N0   09/17/2014 Procedure   MyRisk panel ([employer]) 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 Otherwise negative at APC, ATM, BARD1, BMPR1A, BRCA1, BRCA2, BRIP1, CHD1, CDK4, CDKN2A,  CHEK2, MLH1, MSH2, MSH6, MUTYH, NBN, PALB2, PMS2, PTEN, RAD51C, RAD51D, SMAD4, STK11, and TP53   09/20/2014 Breast MRI   RIGHT: 1 x 0.8 x 0.9 cm lobulated border mass in the retroareolar lower slight lateral area. LEFT: hematoma with surrounding adjacent enhancement encompassing a 4.6 x 4.9 x 4.1 cm area.    09/27/2014 - 01/10/2015 Neo-Adjuvant Chemotherapy   Neoadjuvant TCH Perjeta every 3 week 6 followed by Herceptin maintenance completed 09/21/2015   01/13/2015 Breast MRI   Postsurgical changes in left breast without residual enhancing masses compatible with treatment response   02/10/2015 Definitive Surgery   Bilateral mastectomies: RIGHT: benign.  LEFT: complete path response;  0/3 sentinel nodes   02/10/2015 Pathologic Stage   ypT0 ypN0 ypM0   02/22/2015 -  Anti-estrogen oral therapy   Zoladex with tamoxifen, stopped Zoladex for intolerance, decreased tamoxifen to 10 mg daily.   11/17/2015 Survivorship   Survivorship care plan completed and given to patient    Relapse/Recurrence   Within the upper-outer left breast anterior depth there are 2 adjacent irregular enhancing masses (2.1 cm and 1.5 cm) compatible with biopsy-proven malignancy. Indeterminate enhancing mass within the outer left breast posterior depth (1.8 cm). Indeterminate enhancing mass within the outer lower posterior right breast. 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 ER 80%, PR 0%, Her 2 Positive   08/12/2020 Cancer Staging   Staging form: Breast, AJCC 8th Edition - Pathologic stage from 08/12/2020: Stage IA (pT1b, pN0, cM0, G2, ER+, PR-, HER2+) - Signed by Gardenia Phlegm, NP on 08/24/2020 Stage prefix: Initial diagnosis Histologic grading  system: 3 grade system     Observations/Objective:  There were no vitals filed for this visit. There is no height or weight on file to calculate BMI.  I have reviewed the data as listed CMP Latest Ref Rng & Units 11/19/2018  11/20/2016 05/30/2016  Glucose 70 - 99 mg/dL 90 84 88  BUN 6 - 23 mg/dL 17 11.8 17  Creatinine 0.40 - 1.20 mg/dL 0.79 0.8 0.68  Sodium 135 - 145 mEq/L 138 140 140  Potassium 3.5 - 5.1 mEq/L 4.1 4.3 4.6  Chloride 96 - 112 mEq/L 106 - 107  CO2 19 - 32 mEq/L 25 24 27   Calcium 8.4 - 10.5 mg/dL 8.9 9.6 9.2  Total Protein 6.0 - 8.3 g/dL 7.0 6.9 6.9  Total Bilirubin 0.2 - 1.2 mg/dL 0.4 0.44 0.2  Alkaline Phos 39 - 117 U/L 36(L) 51 64  AST 0 - 37 U/L 23 29 18   ALT 0 - 35 U/L 14 15 10     Lab Results  Component Value Date   WBC 4.1 11/19/2018   HGB 12.6 11/19/2018   HCT 37.6 11/19/2018   MCV 91.5 11/19/2018   PLT 179.0 11/19/2018   NEUTROABS 2.0 11/19/2018      Assessment Plan:  Breast cancer of upper-outer quadrant of left female breast 08/13/2014: Left breast IDC. Neo adj chemo with TCHP foll by Herceptin maintenance (path CR: Bil mastectomies and reconstruction), Tamoxifen (stoped and started due to intolerance) ATM gene mutation  08/12/20 Relapsed Breast Cancer: Left Breast UOQ 2.1 cm and 1.5 cm. Indeterminate 1.8 cm and Right breast indeterminate 1.5 cm.  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  chemotherapy with Kadcyla for currently unresectable breast cancer. 2. Surgery 3. Followed by XRT 4. Further adjuvant antiestrogen therapy (ovarian suppression or oophorectomy plus anastrozole) -------------------------------------------------------------------------------------------------------- I would like to pathology to perform prognostic panel on the positive biopsy in the left lateral breast. If the biopsy is HER-2 positive then we will plan for Kadcyla. If it is negative then we may have to consider giving her Adriamycin and Cytoxan. She is very concerned about receiving  chemotherapy now because of where she is at in life.  She does not think that she will be able to handle chemotherapy right away because of her work and personal life issues.  Return to clinic after port placement to start [place].  I discussed the assessment and treatment plan with the patient. The patient was provided an opportunity to ask questions and all were answered. The patient agreed with the plan and demonstrated an understanding of the instructions. The patient was advised to call back or seek an in-person evaluation if the symptoms worsen or if the condition fails to improve as anticipated.   Total time spent: 30 minutes including face-to-face MyChart video visit time and time spent for planning, charting and coordination of care  Rulon Eisenmenger, MD 08/31/2020   I, Cloyde Reams Dorshimer, am acting as scribe for Nicholas Lose, MD.  I have reviewed the above documentation for accuracy and completeness, and I agree with the above.

## 2020-08-31 ENCOUNTER — Telehealth (HOSPITAL_BASED_OUTPATIENT_CLINIC_OR_DEPARTMENT_OTHER): Payer: BC Managed Care – PPO | Admitting: Hematology and Oncology

## 2020-08-31 DIAGNOSIS — Z9221 Personal history of antineoplastic chemotherapy: Secondary | ICD-10-CM | POA: Diagnosis not present

## 2020-08-31 DIAGNOSIS — Z923 Personal history of irradiation: Secondary | ICD-10-CM | POA: Diagnosis not present

## 2020-08-31 DIAGNOSIS — C50412 Malignant neoplasm of upper-outer quadrant of left female breast: Secondary | ICD-10-CM | POA: Diagnosis present

## 2020-08-31 DIAGNOSIS — Z79899 Other long term (current) drug therapy: Secondary | ICD-10-CM | POA: Diagnosis not present

## 2020-08-31 DIAGNOSIS — Z170 Estrogen receptor positive status [ER+]: Secondary | ICD-10-CM | POA: Diagnosis not present

## 2020-08-31 DIAGNOSIS — Z79810 Long term (current) use of selective estrogen receptor modulators (SERMs): Secondary | ICD-10-CM | POA: Diagnosis not present

## 2020-08-31 MED ORDER — LIDOCAINE-PRILOCAINE 2.5-2.5 % EX CREA: TOPICAL_CREAM | CUTANEOUS | 3 refills | Status: DC

## 2020-08-31 NOTE — Progress Notes (Signed)
START ON PATHWAY REGIMEN - Breast     A cycle is every 21 days:     Ado-trastuzumab emtansine   **Always confirm dose/schedule in your pharmacy ordering system**  Patient Characteristics: Distant Metastases or Locoregional Recurrent Disease - Unresected or Locally Advanced Unresectable Disease Progressing after Neoadjuvant and Local Therapies, HER2 Positive, ER Positive, Chemotherapy + HER2-Targeted Therapy, Second Line Therapeutic Status: Locally Advanced Disease - Unresectable and Progressing after Neoadjuvant and Local Therapies ER Status: Positive (+) HER2 Status: Positive (+) PR Status: Negative (-) Line of Therapy: Second Line Intent of Therapy: Curative Intent, Discussed with Patient

## 2020-09-01 ENCOUNTER — Encounter (HOSPITAL_COMMUNITY): Payer: Self-pay

## 2020-09-01 ENCOUNTER — Encounter: Payer: Self-pay | Admitting: *Deleted

## 2020-09-01 ENCOUNTER — Encounter: Payer: Self-pay | Admitting: Hematology and Oncology

## 2020-09-01 ENCOUNTER — Encounter (HOSPITAL_COMMUNITY)
Admission: RE | Admit: 2020-09-01 | Discharge: 2020-09-01 | Disposition: A | Discharge status: Home / Self Care | Payer: BC Managed Care – PPO | Source: Ambulatory Visit | Attending: General Surgery | Admitting: General Surgery

## 2020-09-01 ENCOUNTER — Other Ambulatory Visit: Payer: Self-pay

## 2020-09-01 DIAGNOSIS — Z01812 Encounter for preprocedural laboratory examination: Secondary | ICD-10-CM | POA: Insufficient documentation

## 2020-09-01 LAB — BASIC METABOLIC PANEL
Anion gap: 7 (ref 5–15)
BUN: 12 mg/dL (ref 6–20)
CO2: 25 mmol/L (ref 22–32)
Calcium: 9.3 mg/dL (ref 8.9–10.3)
Chloride: 108 mmol/L (ref 98–111)
Creatinine, Ser: 0.66 mg/dL (ref 0.44–1.00)
GFR, Estimated: >60 mL/min (ref >60)
Glucose, Bld: 96 mg/dL (ref 70–99)
Potassium: 4.4 mmol/L (ref 3.5–5.1)
Sodium: 140 mmol/L (ref 135–145)

## 2020-09-01 LAB — CBC
HCT: 38.2 % (ref 36.0–46.0)
Hemoglobin: 12.1 g/dL (ref 12.0–15.0)
MCH: 29.8 pg (ref 26.0–34.0)
MCHC: 31.7 g/dL (ref 30.0–36.0)
MCV: 94.1 fL (ref 80.0–100.0)
Platelets: 171 10*3/uL (ref 150–400)
RBC: 4.06 MIL/uL (ref 3.87–5.11)
RDW: 11.9 % (ref 11.5–15.5)
WBC: 3.5 10*3/uL — ABNORMAL LOW (ref 4.0–10.5)
nRBC: 0 % (ref 0.0–0.2)

## 2020-09-01 NOTE — Progress Notes (Signed)
Spoke to [place] at Dr. Cristal Generous office and requested updated consent order.  Patient states that she is no longer having two procedures and is just having the port placement.

## 2020-09-01 NOTE — Progress Notes (Signed)
Location of Breast Cancer: Left breast   Histology per Pathology Report:  08/12/2020      Receptor Status: ER(80%), PR (0%), Her2-neu (+), Ki-67(50%)  Did patient present with symptoms (if so, please note symptoms) or was this found on screening mammography?:   40 year old female with history of left breast cancer in 2016 status post bilateral mastectomy. The patient has bilateral implants and autoglogous fat reconstruction. Short-term follow-up of probable fat necrosis in the left breast and probable benign mass in the right breast. The patient states that she does not do routine physical exams and is unsure of the masses in the left breast have increased in size.  Past/Anticipated interventions by surgeon, if any:  08/2014 Lumpectomy 02/10/2015 Mastectomy & Reconstruction 06/07/2015 Tissue expander removal 08/12/2020 Biopsy    Past/Anticipated interventions by medical oncology, if any: Carboplatin/taxotere/perjeta/herceptin 2016 Kadcyla is planned to start 09/07/2020  Lymphedema issues, if any:  no  Pain issues, if any:  no  SAFETY ISSUES:  Prior radiation? no  Pacemaker/ICD? no  Possible current pregnancy? no  Is the patient on methotrexate? no  Current Complaints / other details:  Mom Gustavo Lah) is present with patient today and is her primary support person.    Vitals:   09/05/20 1346  BP: 120/83  Pulse: 73  Resp: 18  Temp: (!) 97.2 F (36.2 C)  TempSrc: Temporal  SpO2: 100%  Weight: 167 lb 4 oz (75.9 kg)  Height: 5' 11"  (1.803 m)

## 2020-09-02 ENCOUNTER — Other Ambulatory Visit: Payer: Self-pay

## 2020-09-02 ENCOUNTER — Other Ambulatory Visit: Payer: Self-pay | Admitting: Hematology and Oncology

## 2020-09-02 ENCOUNTER — Other Ambulatory Visit (HOSPITAL_COMMUNITY)
Admission: RE | Admit: 2020-09-02 | Discharge: 2020-09-02 | Disposition: A | Discharge status: Home / Self Care | Payer: BC Managed Care – PPO | Source: Ambulatory Visit | Attending: General Surgery | Admitting: General Surgery

## 2020-09-02 DIAGNOSIS — Z01812 Encounter for preprocedural laboratory examination: Secondary | ICD-10-CM | POA: Insufficient documentation

## 2020-09-02 DIAGNOSIS — Z20822 Contact with and (suspected) exposure to covid-19: Secondary | ICD-10-CM | POA: Insufficient documentation

## 2020-09-03 LAB — SARS CORONAVIRUS 2 (TAT 6-24 HRS): SARS Coronavirus 2: NEGATIVE

## 2020-09-05 ENCOUNTER — Ambulatory Visit
Admission: RE | Admit: 2020-09-05 | Discharge: 2020-09-05 | Disposition: A | Discharge status: Home / Self Care | Payer: BC Managed Care – PPO | Source: Ambulatory Visit | Attending: Radiation Oncology | Admitting: Radiation Oncology

## 2020-09-05 ENCOUNTER — Encounter: Payer: Self-pay | Admitting: Radiation Oncology

## 2020-09-05 ENCOUNTER — Other Ambulatory Visit: Payer: Self-pay

## 2020-09-05 ENCOUNTER — Telehealth: Payer: Self-pay | Admitting: Hematology and Oncology

## 2020-09-05 ENCOUNTER — Encounter: Payer: Self-pay | Admitting: Hematology and Oncology

## 2020-09-05 VITALS — BP 120/83 | HR 73 | Temp 97.2°F | Resp 18 | Ht 71.0 in | Wt 167.2 lb

## 2020-09-05 DIAGNOSIS — Z79810 Long term (current) use of selective estrogen receptor modulators (SERMs): Secondary | ICD-10-CM | POA: Insufficient documentation

## 2020-09-05 DIAGNOSIS — C50812 Malignant neoplasm of overlapping sites of left female breast: Secondary | ICD-10-CM | POA: Insufficient documentation

## 2020-09-05 DIAGNOSIS — R590 Localized enlarged lymph nodes: Secondary | ICD-10-CM | POA: Diagnosis not present

## 2020-09-05 DIAGNOSIS — Z803 Family history of malignant neoplasm of breast: Secondary | ICD-10-CM | POA: Insufficient documentation

## 2020-09-05 DIAGNOSIS — C50412 Malignant neoplasm of upper-outer quadrant of left female breast: Secondary | ICD-10-CM

## 2020-09-05 DIAGNOSIS — M5127 Other intervertebral disc displacement, lumbosacral region: Secondary | ICD-10-CM | POA: Insufficient documentation

## 2020-09-05 DIAGNOSIS — Z79899 Other long term (current) drug therapy: Secondary | ICD-10-CM | POA: Diagnosis not present

## 2020-09-05 DIAGNOSIS — C50919 Malignant neoplasm of unspecified site of unspecified female breast: Secondary | ICD-10-CM

## 2020-09-05 DIAGNOSIS — Z923 Personal history of irradiation: Secondary | ICD-10-CM | POA: Insufficient documentation

## 2020-09-05 DIAGNOSIS — F419 Anxiety disorder, unspecified: Secondary | ICD-10-CM | POA: Insufficient documentation

## 2020-09-05 DIAGNOSIS — Z8042 Family history of malignant neoplasm of prostate: Secondary | ICD-10-CM | POA: Diagnosis not present

## 2020-09-05 DIAGNOSIS — Z801 Family history of malignant neoplasm of trachea, bronchus and lung: Secondary | ICD-10-CM | POA: Insufficient documentation

## 2020-09-05 DIAGNOSIS — Z9013 Acquired absence of bilateral breasts and nipples: Secondary | ICD-10-CM | POA: Insufficient documentation

## 2020-09-05 DIAGNOSIS — Z170 Estrogen receptor positive status [ER+]: Secondary | ICD-10-CM

## 2020-09-05 NOTE — Telephone Encounter (Signed)
Scheduled per 03/17 scheduled message, patient has been called and notified of upcoming appointments.

## 2020-09-05 NOTE — Progress Notes (Signed)
Radiation Oncology         [phone] ________________________________  Initial Outpatient Consultation  Name: Natasha Torres MRN: [id number]  Date: 09/05/2020  DOB: January 26, 1981  CC:Natasha Sanes, MD  Natasha Loron, MD   REFERRING PHYSICIAN: Emelia Loron, MD  DIAGNOSIS: Recurrent left breast cancer  HISTORY OF PRESENT ILLNESS::Natasha Torres is a 40 y.o. female who is seen as a courtesy of Dr. Dwain Sarna for an opinion concerning radiation therapy as part of management for her recently diagnosed recurrent left breast cancer. Today, she is accompanied by her mother who normally lives in the [place] area. The patient's breast cancer history dates back to early 2016 when she had a palpable lump in the left breast. Diagnostic mammogram on 08/13/2014 revealed two suspicious masses in the upper outer quadrant of the left breast. Biopsy on 08/27/2014 revealed grade 3 multifocal invasive ductal carcinoma, ER 90% positive, PR 0% negative, Her2 positive, Ki67 of 30%.  MRI of bilateral breasts on 09/20/2014 showed a large hematoma in the upper outer quadrant of the left breast with surrounding adjacent enhancement. It was difficult to distinctly separate post-biopsy hematoma and changes from biopsy-proven breast cancer. There were small masses in the retroareolar bilateral breast for which second-look ultrasound with biopsy was recommended. Finally, there were enlarged left axillary lymph nodes. Ultrasound of bilateral breasts on that same day showed a hypoechoic retroareolar left breast nodule that warranted tissue diagnosis. There were slightly prominent left axillary lymph nodes. Finally, there was a circumscribed nodule in the 6 o'clock location of the right breast that showed enhancement on MRI. Left axillary lymph node was biopsied and was benign. The left breast nodule and right breast nodule were also biopsied, both of which were negative.  CT scan of chest, abdomen, and pelvis on  09/24/2014 showed post-surgical changes in the left breast and left axilla. The was a left axillary lymph node that was borderline enlarged and could have represented a focus of metastatic adenopathy. There was no evidence of distant metastatic disease.   Bone scan on 10/04/2014 showed no evidence of metastatic disease.  The patient underwent neoadjuvant chemotherapy with TCH Perjeta x6 from 09/27/2014 to 01/10/2015 under the care of Dr. Pamelia Hoit. She then completed Herceptin maintenance on 09/21/2015.  MRI of bilateral breasts on 01/13/2015 showed post-surgical changes in the left breast without residual enhancing masses or abnormal areas of enhancement, compatible with treatment response. There was no MRI evidence of malignancy in the right breast.  The patient opted to proceed with bilateral nipple sparing mastectomies on 02/10/2015 under the care of Dr. Dwain Sarna. Left breast showed a complete pathologic response. Right breast was benign. Three left axillary sentinel lymph nodes were also biopsied and all were negative for malignancy.  Patient was seen in radiation oncology by Dr. Lurline Hare.  Given the findings of complete response and no findings of metastatic spread to the axillary lymph nodes or findings of a large tumor, there were no recommendations for postmastectomy radiation therapy.  The patient began anti-estrogen oral therapy with Zoladex and Tamoxifen on 02/22/2015. Zoladex was discontinued for intolerance and Tamoxifen was decreased.  Since the end of treatment, the patient has undergone serial diagnostic mammograms and ultrasounds. In most recent history, the patient underwent a unilateral diagnostic mammogram and bilateral breast ultrasound on 08/10/2020 that showed a stable probable benign mass in the 10 o'clock region of the right breast 7 cm from the nipple. There was also noted to be indeterminate palpable masses in the lateral aspect  of the left breast. Biopsy on 08/12/2020  revealed grade 3 invasive mammary carcinoma with mammary carcinoma in-situ. E-cadherin was positive, supporting a ductal origin. Prognostic indicators were significant for estrogen receptor 80% positive with a moderate staining intensity, progesterone receptor 0% negative, Ki67 of 50%, her2 positive.  MRI of bilateral breasts on 08/17/2020 showed two adjacent irregular enhancing masses in the upper outer left breast at anterior depth, compatible with biopsy-proven malignancy. The susceptibility artifact from the biopsy marking clips was located centrally and the masses extended superior and inferior from the biopsy marking clips. There was also noted to be an indeterminate enhancing mass within the outer left breast at posterior depth. Finally, there was an indeterminate enhancing mass within the outer lower posterior right breast.  Bone scan on 08/22/2020 showed no evidence of bony metastases.  CT scan of chest, abdomen, and pelvis on 08/24/2020 showed the biopsy-proven left-sided breast cancer with multiple areas of soft tissue nodularity in the left breast, which corresponded with the enhancing lesions seen on breast MRI. There were prominent left axillary lymph nodes that measured up to 5 mm. There was no evidence of metastatic disease in the abdomen or pelvis. Finally, there was a 4.5 cm left adnexal cyst.   Ultrasound of bilateral breasts on 08/25/2020 showed a suspicious 0.7 cm mass in the far lateral left breast at the 3 o'clock position felt to correspond well with the mass seen on MRI. There was also noted to be an indeterminate 0.9 cm mass in the left breast at the 4 o'clock position, possibly a lymph node. Finally, there was an indeterminate 0.7 cm mass in the right breast at the 8 o'clock position felt to correspond well with the mass seen on MRI.   Biopsy on 08/25/2020 revealed grade 2-3 invasive mammary carcinoma with lobular features of the left breast. Left intramammary lymph node was also  biopsied and was negative for carcinoma. Right breast mass was also biopsied and showed benign fibroadipose tissue with minimal inflammation. Prognostic indicators were significant for estrogen receptor 60% positive with a moderate staining intensity, progesterone receptor 10% positive with a strong staining intensity, her2 positive, Ki67 of 20%.   Patient is now seen in radiation oncology before initiation of any treatment and for discussion of potential radiation therapy as part of the management of her recurrent left breast cancer.  The patient is scheduled for Port-A-Cath placement tomorrow in preparation for neoadjuvant therapy, most likely chemotherapy with Kadcyla as the recurrence is HER-2/neu positive.  PREVIOUS RADIATION THERAPY: No  PAST MEDICAL HISTORY:  Past Medical History:  Diagnosis Date  . Anemia    takes iron supplement  . Anxiety   . Family history of adverse reaction to anesthesia    pt's mother has hx. of post-op N/V  . History of breast cancer 2016  . left breast ca 08/2014  . Recurrent breast cancer, left (HCC) dx'd 07/2020  . Tattoo of skin 06/07/2016   new tattoo right ankle    PAST SURGICAL HISTORY: Past Surgical History:  Procedure Laterality Date  . BREAST LUMPECTOMY Left 08/2014  . BREAST RECONSTRUCTION WITH PLACEMENT OF TISSUE EXPANDER AND FLEX HD (ACELLULAR HYDRATED DERMIS) Bilateral 02/10/2015   Procedure: BREAST RECONSTRUCTION WITH PLACEMENT OF TISSUE EXPANDER AND FLEX HD (ACELLULAR HYDRATED DERMIS);  Surgeon: Glenna Fellows, MD;  Location: [facility] OR;  Service: Plastics;  Laterality: Bilateral;  . INCISION AND DRAINAGE OF WOUND Left 03/02/2015   Procedure: IRRIGATION BREAST POCKET AND EXCHANGE OF LEFT BREAST TISSUE EXPANDER;  Surgeon:  Glenna Fellows, MD;  Location: [facility] OR;  Service: Plastics;  Laterality: Left;  . LIPOSUCTION WITH LIPOFILLING Bilateral 06/07/2015   Procedure: LIPOSUCTION WITH LIPOFILLING TO BILATERAL CHEST;  Surgeon: Glenna Fellows, MD;   Location: [facility];  Service: Plastics;  Laterality: Bilateral;  . LIPOSUCTION WITH LIPOFILLING Bilateral 09/23/2015   Procedure: LIPOFILLING FROM BILATERAL THIGHS TO BILATERAL CHEST ;  Surgeon: Glenna Fellows, MD;  Location: [facility];  Service: Plastics;  Laterality: Bilateral;  . LIPOSUCTION WITH LIPOFILLING Bilateral 06/15/2016   Procedure: LIPOFILLING FROM BILATERAL THIGH TO BILATERAL CHEST;  Surgeon: Glenna Fellows, MD;  Location: [facility];  Service: Plastics;  Laterality: Bilateral;  LIPOFILLING FROM BILATERAL THIGH TO BILATERAL CHEST  . MASTECTOMY Bilateral 02/10/2015  . NIPPLE SPARING MASTECTOMY/SENTINAL LYMPH NODE BIOPSY/RECONSTRUCTION/PLACEMENT OF TISSUE EXPANDER Bilateral 02/10/2015   Procedure: BILATERAL  NIPPLE SPARING MASTECTOMY WITH LEFT  SENTINAL LYMPH NODE BIOPSY(RIGHT BREAST PROPHYLACTIC);  Surgeon: Natasha Loron, MD;  Location: [facility] OR;  Service: General;  Laterality: Bilateral;  . ORIF FINGER / THUMB FRACTURE Right 1994   thumb  . PORT-A-CATH REMOVAL  01/07/2015   removed and replaced  . PORT-A-CATH REMOVAL Right 09/23/2015   Procedure: REMOVAL PORT-A-CATH;  Surgeon: Glenna Fellows, MD;  Location: [facility];  Service: Plastics;  Laterality: Right;  . PORTACATH PLACEMENT  08/2014; 01/07/2015  . REMOVAL OF BILATERAL TISSUE EXPANDERS WITH PLACEMENT OF BILATERAL BREAST IMPLANTS Bilateral 06/07/2015   Procedure: REMOVAL OF BILATERAL TISSUE EXPANDERS WITH PLACEMENT OF BILATERAL BREAST  SILICONE IMPLANTS;  Surgeon: Glenna Fellows, MD;  Location: [facility];  Service: Plastics;  Laterality: Bilateral;  . REMOVAL OF TISSUE EXPANDER AND PLACEMENT OF IMPLANT Right 02/27/2015   Procedure: REMOVAL OF TISSUE EXPANDER AND PLACEMENT OF NEW TISSUE EXPANDER;  Surgeon: Glenna Fellows, MD;  Location: [facility] OR;  Service: Plastics;  Laterality: Right;  . SALPINGOOPHORECTOMY Right 11/22/2010  . TISSUE EXPANDER PLACEMENT  Left 03/02/2015   Procedure: TISSUE EXPANDER;  Surgeon: Glenna Fellows, MD;  Location: [facility] OR;  Service: Plastics;  Laterality: Left;  . WISDOM TOOTH EXTRACTION  2001    FAMILY HISTORY:  Family History  Problem Relation Age of Onset  . Hypertension Mother   . Anesthesia problems Mother        post-op N/V  . Prostate cancer Father 40  . Depression Maternal Aunt   . Dementia Maternal Grandmother   . Cancer Maternal Grandfather   . Heart attack Maternal Grandfather   . Dementia Paternal Grandmother   . Kidney cancer Paternal Grandmother        slow growing, no treatment  . Cancer Paternal Grandfather   . Prostate cancer Paternal Grandfather 68  . Bone cancer Paternal Grandfather 19  . Breast cancer Paternal Grandfather 56  . Lung cancer Paternal Grandfather        dx late 79s; smoker.  thought to be a 4th primary cancer    SOCIAL HISTORY:  Social History   Tobacco Use  . Smoking status: Never Smoker  . Smokeless tobacco: Never Used  Vaping Use  . Vaping Use: Never used  Substance Use Topics  . Alcohol use: Yes    Alcohol/week: 0.0 standard drinks    Comment: Occasional  . Drug use: No    ALLERGIES:  Allergies  Allergen Reactions  . Buprenorphine Hcl Itching  . Morphine And Related Itching  . Other Rash    DERMABOND  . Penicillins Itching  . Tegaderm Ag Mesh [Silver] Rash  MEDICATIONS:  Current Outpatient Medications  Medication Sig Dispense Refill  . Biotin 5000 MCG CAPS Take 5,000 mcg by mouth at bedtime.    . Cholecalciferol (VITAMIN D3) 50 MCG (2000 UT) TABS Take 2,000 Units by mouth at bedtime.    . lidocaine-prilocaine (EMLA) cream Apply to affected area once 30 g 3  . loratadine (CLARITIN) 10 MG tablet Take 10 mg by mouth daily.    [name] LORazepam (ATIVAN) 0.5 MG tablet Take 1 tablet (0.5 mg total) by mouth every 6 (six) hours as needed for anxiety. (Patient taking differently: Take 0.5 mg by mouth See admin instructions. Take 1 tablet (0.5 mg) by mouth  scheduled at bedtime; may take an additional dose during the day if needed for anxiety) 30 tablet 3  . Magnesium Hydroxide 400 MG CHEW Chew 400 mg by mouth at bedtime.    . melatonin 5 MG TABS Take 5 mg by mouth at bedtime.    . Multiple Vitamins-Minerals (ADULT ONE DAILY GUMMIES PO) Take 2 tablets by mouth at bedtime.    . Omega-3 Fatty Acids (FISH OIL) 1200 MG CAPS Take 1,200 mg by mouth at bedtime.    . tamoxifen (NOLVADEX) 10 MG tablet Take 1 tablet (10 mg total) by mouth daily. (Patient taking differently: Take 10 mg by mouth at bedtime.) 90 tablet 3  . Turmeric 500 MG CAPS Take 1 capsule by mouth daily. (Patient taking differently: Take 500 mg by mouth at bedtime.)    . ibuprofen (ADVIL) 200 MG tablet Take 600 mg by mouth every 8 (eight) hours as needed (pain). (Patient not taking: Reported on 09/05/2020)    . oxyCODONE (OXY IR/ROXICODONE) 5 MG immediate release tablet Take 1 tablet (5 mg total) by mouth every 6 (six) hours as needed for moderate pain, severe pain or breakthrough pain. 8 tablet 0   No current facility-administered medications for this encounter.    REVIEW OF SYSTEMS:  A 10+ POINT REVIEW OF SYSTEMS WAS OBTAINED including neurology, dermatology, psychiatry, cardiac, respiratory, lymph, extremities, GI, GU, musculoskeletal, constitutional, reproductive, HEENT.  She denies any pain within the left breast nipple discharge or bleeding.  She denies any swelling in her left arm or hand.  No reports of new bony pain or headaches or visual problems   PHYSICAL EXAM:  height is 5\' 11"  (1.803 m) and weight is 167 lb 4 oz (75.9 kg). Her temporal temperature is 97.2 F (36.2 C) (abnormal). Her blood pressure is 120/83 and her pulse is 73. Her respiration is 18 and oxygen saturation is 100%.   General: Alert and oriented, in no acute distress HEENT: Head is normocephalic. Extraocular movements are intact.  Neck: Neck is supple, no palpable cervical or supraclavicular lymphadenopathy. Heart:  Regular in rate and rhythm with no murmurs, rubs, or gallops. Chest: Clear to auscultation bilaterally, with no rhonchi, wheezes, or rales. Abdomen: Soft, nontender, nondistended, with no rigidity or guarding. Extremities: No cyanosis or edema. Lymphatics: see Neck Exam Skin: No concerning lesions. Musculoskeletal: symmetric strength and muscle tone throughout. Neurologic: Cranial nerves II through XII are grossly intact. No obvious focalities. Speech is fluent. Coordination is intact. Psychiatric: Judgment and insight are intact. Affect is appropriate. Right chest reveals a reconstructed breast after nipple sparing mastectomy.   Palpable cosmetic implant within the chest region.  No suspicious areas noted on exam Left chest reveals a reconstructed breast breast after nipple sparing mastectomy.  Palpable cosmetic implant within the chest region.  Along the areolar border approximately the 2 o'clock  position patient has a palpable approximately 2 to 2.5 cm nodule consistent with malignancy.  ECOG = 1  0 - Asymptomatic (Fully active, able to carry on all predisease activities without restriction)  1 - Symptomatic but completely ambulatory (Restricted in physically strenuous activity but ambulatory and able to carry out work of a light or sedentary nature. For example, light housework, office work)  2 - Symptomatic, <50% in bed during the day (Ambulatory and capable of all self care but unable to carry out any work activities. Up and about more than 50% of waking hours)  3 - Symptomatic, >50% in bed, but not bedbound (Capable of only limited self-care, confined to bed or chair 50% or more of waking hours)  4 - Bedbound (Completely disabled. Cannot carry on any self-care. Totally confined to bed or chair)  5 - Death   Santiago Glad MM, Creech RH, Tormey DC, et al. [phone]). "Toxicity and response criteria of the [facility]". Am. Evlyn Clines. Oncol. 5 (6): 649-55  LABORATORY DATA:   Lab Results  Component Value Date   WBC 3.5 (L) 09/01/2020   HGB 12.1 09/01/2020   HCT 38.2 09/01/2020   MCV 94.1 09/01/2020   PLT 171 09/01/2020   NEUTROABS 2.0 11/19/2018   Lab Results  Component Value Date   NA 140 09/01/2020   K 4.4 09/01/2020   CL 108 09/01/2020   CO2 25 09/01/2020   GLUCOSE 96 09/01/2020   CREATININE 0.66 09/01/2020   CALCIUM 9.3 09/01/2020      RADIOGRAPHY: CT CHEST W CONTRAST  Result Date: 08/25/2020 CLINICAL DATA:  Breast cancer staging, recurrence left breast cancer, previous chemo in 2016. EXAM: CT CHEST, ABDOMEN, AND PELVIS WITH CONTRAST TECHNIQUE: Multidetector CT imaging of the chest, abdomen and pelvis was performed following the standard protocol during bolus administration of intravenous contrast. CONTRAST:  OMNIPAQUE IOHEXOL 300 MG/ML  SOLN COMPARISON:  Nuclear medicine bone scan August 22, 2020 MRI breast August 17, 2020 and CT chest abdomen pelvis September 22, 2014 FINDINGS: CT CHEST FINDINGS Cardiovascular: No thoracic aortic aneurysm. No central pulmonary embolus. Normal size heart. No pericardial effusion. Mediastinum/Nodes: Prominent left axillary lymph nodes measuring up to 5 mm on image 44/2. No mediastinal or hilar adenopathy. Thyroid is unremarkable. Small hiatal hernia. Lungs/Pleura: No suspicious pulmonary nodules or masses. No pleural effusion. No pneumothorax. Musculoskeletal: Postsurgical changes of bilateral mastectomies with reconstruction implants surgical clips in the right axilla and left anterior breast. Biopsy-proven left-sided breast cancer with multiple areas of soft tissue nodularity in the left breast including in the posterior left breast on image 36/2, in the medial anterior breast on image 38/2, along the biopsy clips on image 37/2 and in the anterior breast measuring which is the largest of these and measures 1.5 cm on image 33/2, these correspond with enhancing masses seen on recent breast MRI. No suspicious lytic or blastic  lesions of bone. CT ABDOMEN PELVIS FINDINGS Hepatobiliary: No suspicious hepatic lesions. Gallbladder is unremarkable. No biliary ductal dilation. Pancreas: Unremarkable. Spleen: Unremarkable. Adrenals/Urinary Tract: Adrenal glands are unremarkable. Kidneys are normal, without renal calculi, focal lesion, or hydronephrosis. Bladder is unremarkable. Stomach/Bowel: Enteric contrast visualized to the level of the distal small bowel. Stomach is within normal limits. Appendix appears normal. No evidence of bowel wall thickening, distention, or inflammatory changes. Vascular/Lymphatic: No significant vascular findings are present. No enlarged abdominal or pelvic lymph nodes. Reproductive: Normal positioning of the intrauterine device. 4.5 cm left adnexal cyst. Right adnexa is unremarkable. Other:  No abdominopelvic ascites. Calcified granuloma in the right gluteal subcutaneous soft tissues. L5-S1 discogenic disease. Musculoskeletal: L5-S1 discogenic disease. Unchanged size and appearance of the sclerotic left acetabular lesion which measures approximately 9 mm on image 44/4, given approximate 6 years of stability and no corresponding abnormal radiotracer uptake on recent bone scan this is favored represent a benign entity such as a bone island. No suspicious lytic or blastic lesions of bone. IMPRESSION: 1. Biopsy-proven left-sided breast cancer with multiple areas of soft tissue nodularity in the left breast which correspond with enhancing lesions seen on recent breast MRI. 2. Prominent left axillary lymph nodes measuring up to 5 mm, recommend further evaluation with ultrasound. 3. No evidence of metastatic disease within the abdomen or pelvis. 4. 4.5 cm left adnexal cyst, No follow-up imaging recommended. Note: This recommendation does not apply to premenarchal patients and to those with increased risk (genetic, family history, elevated tumor markers or other high-risk factors) of ovarian cancer. Reference: JACR 2020 Feb;  17(2):248-254 Electronically Signed   By: Maudry Mayhew MD   On: 08/25/2020 14:28   NM Bone Scan Whole Body  Result Date: 08/22/2020 CLINICAL DATA:  Left breast cancer, staging EXAM: NUCLEAR MEDICINE WHOLE BODY BONE SCAN TECHNIQUE: Whole body anterior and posterior images were obtained approximately 3 hours after intravenous injection of radiopharmaceutical. RADIOPHARMACEUTICALS:  19.5 mCi Technetium-64m MDP IV COMPARISON:  10/04/2014 FINDINGS: Anterior and posterior whole body planar images are obtained. Physiologic excretion of radiotracer is seen within the kidneys and bladder. Mild degenerative type activity is seen at the bilateral shoulders. I do not see any focal abnormal radiotracer uptake to suggest an acute or destructive process. Specifically, no evidence of bony metastatic disease. IMPRESSION: 1. No evidence of bony metastases. Electronically Signed   By: Sharlet Salina M.D.   On: 08/22/2020 22:04   CT ABDOMEN PELVIS W CONTRAST  Result Date: 08/25/2020 CLINICAL DATA:  Breast cancer staging, recurrence left breast cancer, previous chemo in 2016. EXAM: CT CHEST, ABDOMEN, AND PELVIS WITH CONTRAST TECHNIQUE: Multidetector CT imaging of the chest, abdomen and pelvis was performed following the standard protocol during bolus administration of intravenous contrast. CONTRAST:  OMNIPAQUE IOHEXOL 300 MG/ML  SOLN COMPARISON:  Nuclear medicine bone scan August 22, 2020 MRI breast August 17, 2020 and CT chest abdomen pelvis September 22, 2014 FINDINGS: CT CHEST FINDINGS Cardiovascular: No thoracic aortic aneurysm. No central pulmonary embolus. Normal size heart. No pericardial effusion. Mediastinum/Nodes: Prominent left axillary lymph nodes measuring up to 5 mm on image 44/2. No mediastinal or hilar adenopathy. Thyroid is unremarkable. Small hiatal hernia. Lungs/Pleura: No suspicious pulmonary nodules or masses. No pleural effusion. No pneumothorax. Musculoskeletal: Postsurgical changes of bilateral mastectomies  with reconstruction implants surgical clips in the right axilla and left anterior breast. Biopsy-proven left-sided breast cancer with multiple areas of soft tissue nodularity in the left breast including in the posterior left breast on image 36/2, in the medial anterior breast on image 38/2, along the biopsy clips on image 37/2 and in the anterior breast measuring which is the largest of these and measures 1.5 cm on image 33/2, these correspond with enhancing masses seen on recent breast MRI. No suspicious lytic or blastic lesions of bone. CT ABDOMEN PELVIS FINDINGS Hepatobiliary: No suspicious hepatic lesions. Gallbladder is unremarkable. No biliary ductal dilation. Pancreas: Unremarkable. Spleen: Unremarkable. Adrenals/Urinary Tract: Adrenal glands are unremarkable. Kidneys are normal, without renal calculi, focal lesion, or hydronephrosis. Bladder is unremarkable. Stomach/Bowel: Enteric contrast visualized to the level of the distal small  bowel. Stomach is within normal limits. Appendix appears normal. No evidence of bowel wall thickening, distention, or inflammatory changes. Vascular/Lymphatic: No significant vascular findings are present. No enlarged abdominal or pelvic lymph nodes. Reproductive: Normal positioning of the intrauterine device. 4.5 cm left adnexal cyst. Right adnexa is unremarkable. Other: No abdominopelvic ascites. Calcified granuloma in the right gluteal subcutaneous soft tissues. L5-S1 discogenic disease. Musculoskeletal: L5-S1 discogenic disease. Unchanged size and appearance of the sclerotic left acetabular lesion which measures approximately 9 mm on image 44/4, given approximate 6 years of stability and no corresponding abnormal radiotracer uptake on recent bone scan this is favored represent a benign entity such as a bone island. No suspicious lytic or blastic lesions of bone. IMPRESSION: 1. Biopsy-proven left-sided breast cancer with multiple areas of soft tissue nodularity in the left  breast which correspond with enhancing lesions seen on recent breast MRI. 2. Prominent left axillary lymph nodes measuring up to 5 mm, recommend further evaluation with ultrasound. 3. No evidence of metastatic disease within the abdomen or pelvis. 4. 4.5 cm left adnexal cyst, No follow-up imaging recommended. Note: This recommendation does not apply to premenarchal patients and to those with increased risk (genetic, family history, elevated tumor markers or other high-risk factors) of ovarian cancer. Reference: JACR 2020 Feb; 17(2):248-254 Electronically Signed   By: Maudry Mayhew MD   On: 08/25/2020 14:28   MR BREAST BILATERAL W WO CONTRAST INC CAD  Result Date: 08/18/2020 CLINICAL DATA:  Patient with history of left breast cancer 2016 status post bilateral mastectomies and reconstruction. EXAM: BILATERAL BREAST MRI WITH AND WITHOUT CONTRAST TECHNIQUE: Multiplanar, multisequence MR images of both breasts were obtained prior to and following the intravenous administration of 8 ml of Gadavist Three-dimensional MR images were rendered by post-processing of the original MR data on an independent workstation. The three-dimensional MR images were interpreted, and findings are reported in the following complete MRI report for this study. Three dimensional images were evaluated at the independent interpreting workstation using the DynaCAD thin client. COMPARISON:  Previous exam(s). FINDINGS: Breast composition: a. Almost entirely fat. Background parenchymal enhancement: Minimal Right breast: Right breast mastectomy and implant reconstruction. Within the lower outer right breast posterior depth there is a 1.5 x 1.1 cm focal area of enhancement suggestive of small mass adjacent to the implant (image 114; series 9). No additional sites of enhancement identified within the reconstructed right breast. Left breast: Left breast mastectomy and implant reconstruction. Within the anterior upper outer left breast there is a 2.1 x  1.2 cm enhancing mass. Along the lateral margin of this mass is susceptibility artifact most compatible with biopsy marking clips. Just inferior to the biopsy marking clips as an additional 1.5 x 0.7 cm irregular enhancing mass. Within the left breast 3 o'clock position far posterior there is a 1.8 x 1.1 cm enhancing mass (image 74; series 2). Lymph nodes: No abnormal appearing lymph nodes. Ancillary findings:  None. IMPRESSION: 1. Within the upper-outer left breast anterior depth there are 2 adjacent irregular enhancing masses compatible with biopsy-proven malignancy. The susceptibility artifact from the biopsy marking clips are located centrally and the masses extend superior and inferior from the biopsy marking clips. 2. Indeterminate enhancing mass within the outer left breast posterior depth. 3. Indeterminate enhancing mass within the outer lower posterior right breast. RECOMMENDATION: 1. Second-look ultrasound in an attempt to identify the indeterminate enhancing mass within the outer left breast posterior depth. 2. Second-look ultrasound in attempt to identify the enhancing mass within the lower  outer posterior right breast. 3. If these are identified, biopsy would be recommended. 4. If these are not able to be visualized and biopsied sonographically, MRI guided clip placement would be recommended for each area with excision at the time of surgery for the known left breast malignancy. BI-RADS CATEGORY  4: Suspicious. Electronically Signed   By: Annia Belt M.D.   On: 08/18/2020 12:17   DG C-Arm 1-60 Min-No Report  Result Date: 09/06/2020 Fluoroscopy was utilized by the requesting physician.  No radiographic interpretation.   US BREAST LTD UNI LEFT INC AXILLA  Result Date: 08/25/2020 CLINICAL DATA:  40 year old female with history of left breast cancer post bilateral mastectomies and implant reconstruction 2016 with recent biopsy of 2 palpable masses at the 3 in 330 positions 4 cm from nipple. Recent  breast MRI dated 08/17/2020 demonstrated indeterminate enhancing mass in the far outer posterior left breast adjacent to the implant as well as an indeterminate enhancing mass with the far outer posterior right breast. Patient presents for second-look ultrasound prior to potential biopsies. EXAM: ULTRASOUND OF THE BILATERAL BREAST COMPARISON:  Previous exams. FINDINGS: Targeted ultrasound of the far outer left breast was performed. There is an oval lobulated hypoechoic mass at 4 o'clock 9 cm from nipple measuring 0.9 x 0.4 x 0.7 cm. This demonstrates imaging features suggestive of a lymph node with borderline cortical thickening. There is an irregular hypoechoic mass in the left breast at 3 o'clock 7-8 cm from nipple measuring 0.6 x 0.5 x 0.7 cm. This is felt to correspond well with the enhancing mass seen in the far outer left breast on recent MRI. Targeted ultrasound of the right breast was performed. There is an oval circumscribed hypoechoic mass at 8 o'clock 7 cm from nipple measuring 0.7 x 0.4 x 0.7 cm. This demonstrates imaging features suggestive of a lymph node, also with possible cortical thickening. IMPRESSION: 1. Suspicious 0.7 cm mass in the far lateral left breast at the 3 o'clock position felt to correspond well with the mass seen in the left breast on recent MRI. 2. Indeterminate 0.9 cm mass in the left breast the 4 o'clock position, possibly a lymph node. 3. Indeterminate 0.7 cm mass in the right breast the 8 o'clock position felt to correspond well with the mass seen in the right breast at recent MRI. RECOMMENDATION: Recommend attempt at ultrasound-guided biopsy of the 2 masses in the left breast in the 1 mass in the right breast. This may be technically challenging due to the presence of the adjacent implant. I have discussed the findings and recommendations with the patient. If applicable, a reminder letter will be sent to the patient regarding the next appointment. BI-RADS CATEGORY  4:  Suspicious. Electronically Signed   By: Edwin Cap M.D.   On: 08/25/2020 16:49   US BREAST LTD UNI LEFT INC AXILLA  Result Date: 08/10/2020 CLINICAL DATA:  40 year old female with history of left breast cancer in 2016 status post bilateral mastectomy. The patient has bilateral implants and autoglogous fat reconstruction. Short-term follow-up of probable fat necrosis in the left breast and probable benign mass in the right breast. The patient states that she does not do routine physical exams and is unsure of the masses in the left breast have increased in size. EXAM: DIGITAL DIAGNOSTIC UNILATERAL LEFT MAMMOGRAM WITH TOMOSYNTHESIS AND CAD; ULTRASOUND LEFT BREAST LIMITED; ULTRASOUND RIGHT BREAST LIMITED TECHNIQUE: Bilateral digital diagnostic mammography and breast tomosynthesis was performed. The images were evaluated with computer-aided detection.; Targeted ultrasound examination  of the left breast was performed; Targeted ultrasound examination of the right breast was performed COMPARISON:  Previous exam(s). ACR Breast Density Category b: There are scattered areas of fibroglandular density. FINDINGS: On physical exam, I do not palpate a mass in the 10 o'clock region of the right breast 7 cm from the nipple. I palpate several discrete superficial masses in the 2-4 o'clock region of the left breast 2-4 cm from the nipple. The patient states that she does not do physical exams and is unsure whether they have increased in size. Targeted ultrasound is performed, showing a stable hypoechoic mass in the right breast at 10 o'clock 7 cm from the nipple measuring 3 x 3 x 3 cm. It is stable compared to prior ultrasound dated prior ultrasound dated 12/05/2018. Sonographic evaluation of the left breast showed multiple hypoechoic masses in the breast 2-4 o'clock region of the breast 2-4 cm from the nipple. There is a hypoechoic mass at 3 o'clock 4 cm from the nipple measuring 6 x 3 x 4 mm. There is a hypoechoic mass at  3:30 4 cm from the nipple measuring 4 x 4 x 4 mm. There is a hypoechoic mass at 3:30 2 cm from the nipple measuring 2 mm. Bilateral CC and left MLO images were obtained. Radiopaque BBs were placed on the 2 larger palpable masses. Mammographic images show there are 2 indeterminate masses in the upper outer quadrant measuring 9 and 5 mm. IMPRESSION: Stable probable benign mass in the 10 o'clock region of the right breast 7 cm from the nipple. Indeterminate palpable masses in the lateral aspect of the left breast. Masses could be fat necrosis but malignancy cannot be excluded. RECOMMENDATION: Ultrasound-guided core biopsies of the 2 larger palpable masses in the lateral aspect of the left breast is recommended. At the time of biopsy sonographic evaluation of the left axilla is recommended. I have discussed the findings and recommendations with the patient. If applicable, a reminder letter will be sent to the patient regarding the next appointment. BI-RADS CATEGORY  4: Suspicious. Electronically Signed   By: Baird Lyons M.D.   On: 08/10/2020 16:41   US BREAST LTD UNI RIGHT INC AXILLA  Result Date: 08/25/2020 CLINICAL DATA:  40 year old female with history of left breast cancer post bilateral mastectomies and implant reconstruction 2016 with recent biopsy of 2 palpable masses at the 3 in 330 positions 4 cm from nipple. Recent breast MRI dated 08/17/2020 demonstrated indeterminate enhancing mass in the far outer posterior left breast adjacent to the implant as well as an indeterminate enhancing mass with the far outer posterior right breast. Patient presents for second-look ultrasound prior to potential biopsies. EXAM: ULTRASOUND OF THE BILATERAL BREAST COMPARISON:  Previous exams. FINDINGS: Targeted ultrasound of the far outer left breast was performed. There is an oval lobulated hypoechoic mass at 4 o'clock 9 cm from nipple measuring 0.9 x 0.4 x 0.7 cm. This demonstrates imaging features suggestive of a lymph node  with borderline cortical thickening. There is an irregular hypoechoic mass in the left breast at 3 o'clock 7-8 cm from nipple measuring 0.6 x 0.5 x 0.7 cm. This is felt to correspond well with the enhancing mass seen in the far outer left breast on recent MRI. Targeted ultrasound of the right breast was performed. There is an oval circumscribed hypoechoic mass at 8 o'clock 7 cm from nipple measuring 0.7 x 0.4 x 0.7 cm. This demonstrates imaging features suggestive of a lymph node, also with possible cortical thickening.  IMPRESSION: 1. Suspicious 0.7 cm mass in the far lateral left breast at the 3 o'clock position felt to correspond well with the mass seen in the left breast on recent MRI. 2. Indeterminate 0.9 cm mass in the left breast the 4 o'clock position, possibly a lymph node. 3. Indeterminate 0.7 cm mass in the right breast the 8 o'clock position felt to correspond well with the mass seen in the right breast at recent MRI. RECOMMENDATION: Recommend attempt at ultrasound-guided biopsy of the 2 masses in the left breast in the 1 mass in the right breast. This may be technically challenging due to the presence of the adjacent implant. I have discussed the findings and recommendations with the patient. If applicable, a reminder letter will be sent to the patient regarding the next appointment. BI-RADS CATEGORY  4: Suspicious. Electronically Signed   By: Edwin Cap M.D.   On: 08/25/2020 16:49   US BREAST LTD UNI RIGHT INC AXILLA  Result Date: 08/10/2020 CLINICAL DATA:  40 year old female with history of left breast cancer in 2016 status post bilateral mastectomy. The patient has bilateral implants and autoglogous fat reconstruction. Short-term follow-up of probable fat necrosis in the left breast and probable benign mass in the right breast. The patient states that she does not do routine physical exams and is unsure of the masses in the left breast have increased in size. EXAM: DIGITAL DIAGNOSTIC  UNILATERAL LEFT MAMMOGRAM WITH TOMOSYNTHESIS AND CAD; ULTRASOUND LEFT BREAST LIMITED; ULTRASOUND RIGHT BREAST LIMITED TECHNIQUE: Bilateral digital diagnostic mammography and breast tomosynthesis was performed. The images were evaluated with computer-aided detection.; Targeted ultrasound examination of the left breast was performed; Targeted ultrasound examination of the right breast was performed COMPARISON:  Previous exam(s). ACR Breast Density Category b: There are scattered areas of fibroglandular density. FINDINGS: On physical exam, I do not palpate a mass in the 10 o'clock region of the right breast 7 cm from the nipple. I palpate several discrete superficial masses in the 2-4 o'clock region of the left breast 2-4 cm from the nipple. The patient states that she does not do physical exams and is unsure whether they have increased in size. Targeted ultrasound is performed, showing a stable hypoechoic mass in the right breast at 10 o'clock 7 cm from the nipple measuring 3 x 3 x 3 cm. It is stable compared to prior ultrasound dated prior ultrasound dated 12/05/2018. Sonographic evaluation of the left breast showed multiple hypoechoic masses in the breast 2-4 o'clock region of the breast 2-4 cm from the nipple. There is a hypoechoic mass at 3 o'clock 4 cm from the nipple measuring 6 x 3 x 4 mm. There is a hypoechoic mass at 3:30 4 cm from the nipple measuring 4 x 4 x 4 mm. There is a hypoechoic mass at 3:30 2 cm from the nipple measuring 2 mm. Bilateral CC and left MLO images were obtained. Radiopaque BBs were placed on the 2 larger palpable masses. Mammographic images show there are 2 indeterminate masses in the upper outer quadrant measuring 9 and 5 mm. IMPRESSION: Stable probable benign mass in the 10 o'clock region of the right breast 7 cm from the nipple. Indeterminate palpable masses in the lateral aspect of the left breast. Masses could be fat necrosis but malignancy cannot be excluded. RECOMMENDATION:  Ultrasound-guided core biopsies of the 2 larger palpable masses in the lateral aspect of the left breast is recommended. At the time of biopsy sonographic evaluation of the left axilla is recommended. I  have discussed the findings and recommendations with the patient. If applicable, a reminder letter will be sent to the patient regarding the next appointment. BI-RADS CATEGORY  4: Suspicious. Electronically Signed   By: Baird Lyons M.D.   On: 08/10/2020 16:41   MM DIAG BREAST TOMO UNI LEFT  Result Date: 08/10/2020 CLINICAL DATA:  40 year old female with history of left breast cancer in 2016 status post bilateral mastectomy. The patient has bilateral implants and autoglogous fat reconstruction. Short-term follow-up of probable fat necrosis in the left breast and probable benign mass in the right breast. The patient states that she does not do routine physical exams and is unsure of the masses in the left breast have increased in size. EXAM: DIGITAL DIAGNOSTIC UNILATERAL LEFT MAMMOGRAM WITH TOMOSYNTHESIS AND CAD; ULTRASOUND LEFT BREAST LIMITED; ULTRASOUND RIGHT BREAST LIMITED TECHNIQUE: Bilateral digital diagnostic mammography and breast tomosynthesis was performed. The images were evaluated with computer-aided detection.; Targeted ultrasound examination of the left breast was performed; Targeted ultrasound examination of the right breast was performed COMPARISON:  Previous exam(s). ACR Breast Density Category b: There are scattered areas of fibroglandular density. FINDINGS: On physical exam, I do not palpate a mass in the 10 o'clock region of the right breast 7 cm from the nipple. I palpate several discrete superficial masses in the 2-4 o'clock region of the left breast 2-4 cm from the nipple. The patient states that she does not do physical exams and is unsure whether they have increased in size. Targeted ultrasound is performed, showing a stable hypoechoic mass in the right breast at 10 o'clock 7 cm from the  nipple measuring 3 x 3 x 3 cm. It is stable compared to prior ultrasound dated prior ultrasound dated 12/05/2018. Sonographic evaluation of the left breast showed multiple hypoechoic masses in the breast 2-4 o'clock region of the breast 2-4 cm from the nipple. There is a hypoechoic mass at 3 o'clock 4 cm from the nipple measuring 6 x 3 x 4 mm. There is a hypoechoic mass at 3:30 4 cm from the nipple measuring 4 x 4 x 4 mm. There is a hypoechoic mass at 3:30 2 cm from the nipple measuring 2 mm. Bilateral CC and left MLO images were obtained. Radiopaque BBs were placed on the 2 larger palpable masses. Mammographic images show there are 2 indeterminate masses in the upper outer quadrant measuring 9 and 5 mm. IMPRESSION: Stable probable benign mass in the 10 o'clock region of the right breast 7 cm from the nipple. Indeterminate palpable masses in the lateral aspect of the left breast. Masses could be fat necrosis but malignancy cannot be excluded. RECOMMENDATION: Ultrasound-guided core biopsies of the 2 larger palpable masses in the lateral aspect of the left breast is recommended. At the time of biopsy sonographic evaluation of the left axilla is recommended. I have discussed the findings and recommendations with the patient. If applicable, a reminder letter will be sent to the patient regarding the next appointment. BI-RADS CATEGORY  4: Suspicious. Electronically Signed   By: Baird Lyons M.D.   On: 08/10/2020 16:41   ECHOCARDIOGRAM COMPLETE  Result Date: 08/26/2020    ECHOCARDIOGRAM REPORT   Patient Name:   KYLEEN THIEN Date of Exam: 08/26/2020 Medical Rec #:  [id number]        Height:       71.0 in Accession #:    [id number]       Weight:       166.4 lb Date of Birth:  July 28, 1980        BSA:          1.950 m Patient Age:    39 years         BP:           133/77 mmHg Patient Gender: F                HR:           64 bpm. Exam Location:  Outpatient Procedure: 2D Echo, Cardiac Doppler and Color Doppler Indications:     Chemo Z09  History:        Patient has prior history of Echocardiogram examinations, most                 recent 04/10/2016.  Sonographer:    Eulah Pont RDCS Referring Phys: [id number] Serena Croissant IMPRESSIONS  1. Left ventricular ejection fraction, by estimation, is 50 to 55%. The left ventricle has low normal function. The left ventricle has no regional wall motion abnormalities. Left ventricular diastolic parameters were normal. The average left ventricular  global longitudinal strain is -26.3 %. The global longitudinal strain is normal.  2. Right ventricular systolic function is normal. The right ventricular size is normal. There is normal pulmonary artery systolic pressure.  3. The mitral valve is normal in structure. No evidence of mitral valve regurgitation. No evidence of mitral stenosis.  4. The aortic valve is tricuspid. Aortic valve regurgitation is not visualized. No aortic stenosis is present. Comparison(s): No significant change from prior study. Conclusion(s)/Recommendation(s): Normal biventricular function without evidence of hemodynamically significant valvular heart disease. FINDINGS  Left Ventricle: Left ventricular ejection fraction, by estimation, is 50 to 55%. The left ventricle has low normal function. The left ventricle has no regional wall motion abnormalities. The average left ventricular global longitudinal strain is -26.3 %. The global longitudinal strain is normal. The left ventricular internal cavity size was normal in size. There is no left ventricular hypertrophy. Left ventricular diastolic parameters were normal. Right Ventricle: The right ventricular size is normal. No increase in right ventricular wall thickness. Right ventricular systolic function is normal. There is normal pulmonary artery systolic pressure. The tricuspid regurgitant velocity is 1.77 m/s, and  with an assumed right atrial pressure of 3 mmHg, the estimated right ventricular systolic pressure is 15.5 mmHg. Left  Atrium: Left atrial size was normal in size. Right Atrium: Right atrial size was normal in size. Pericardium: There is no evidence of pericardial effusion. Mitral Valve: The mitral valve is normal in structure. No evidence of mitral valve regurgitation. No evidence of mitral valve stenosis. Tricuspid Valve: The tricuspid valve is normal in structure. Tricuspid valve regurgitation is trivial. No evidence of tricuspid stenosis. Aortic Valve: The aortic valve is tricuspid. Aortic valve regurgitation is not visualized. No aortic stenosis is present. Pulmonic Valve: The pulmonic valve was not well visualized. Pulmonic valve regurgitation is not visualized. No evidence of pulmonic stenosis. Aorta: The aortic root, ascending aorta, aortic arch and descending aorta are all structurally normal, with no evidence of dilitation or obstruction. IAS/Shunts: The atrial septum is grossly normal.  LEFT VENTRICLE PLAX 2D LVIDd:         4.70 cm  Diastology LVIDs:         3.50 cm  LV e' medial:    13.60 cm/s LV PW:         0.60 cm  LV E/e' medial:  5.3 LV IVS:        0.70 cm  LV e' lateral:   17.10 cm/s LVOT diam:     2.10 cm  LV E/e' lateral: 4.2 LV SV:         90 LV SV Index:   46       2D Longitudinal Strain LVOT Area:     3.46 cm 2D Strain GLS (A2C):   -26.4 %                         2D Strain GLS (A3C):   -31.7 %                         2D Strain GLS (A4C):   -20.9 %                         2D Strain GLS Avg:     -26.3 % RIGHT VENTRICLE RV S prime:     17.30 cm/s TAPSE (M-mode): 2.5 cm LEFT ATRIUM             Index       RIGHT ATRIUM           Index LA diam:        2.30 cm 1.18 cm/m  RA Area:     12.10 cm LA Vol (A2C):   19.0 ml 9.74 ml/m  RA Volume:   25.30 ml  12.97 ml/m LA Vol (A4C):   19.9 ml 10.21 ml/m LA Biplane Vol: 20.3 ml 10.41 ml/m  AORTIC VALVE LVOT Vmax:   126.00 cm/s LVOT Vmean:  86.700 cm/s LVOT VTI:    0.261 m  AORTA Ao Root diam: 2.80 cm Ao Asc diam:  3.10 cm MITRAL VALVE               TRICUSPID VALVE MV  Area (PHT): 3.53 cm    TR Peak grad:   12.5 mmHg MV Decel Time: 215 msec    TR Vmax:        177.00 cm/s MV E velocity: 72.00 cm/s MV A velocity: 47.60 cm/s  SHUNTS MV E/A ratio:  1.51        Systemic VTI:  0.26 m                            Systemic Diam: 2.10 cm Jodelle Red MD Electronically signed by Jodelle Red MD Signature Date/Time: 08/26/2020/7:08:36 PM    Final    MM CLIP PLACEMENT LEFT  Result Date: 08/12/2020 CLINICAL DATA:  Left breast biopsy. EXAM: DIAGNOSTIC LEFT MAMMOGRAM POST ULTRASOUND BIOPSY COMPARISON:  Previous exam(s). FINDINGS: Mammographic images were obtained following ultrasound guided biopsy of 2 left breast masses. The ribbon shaped clip was placed at the site of the biopsied 330 left breast mass. The coil shaped clip was placed at the site of the biopsied 3 o'clock left breast mass. IMPRESSION: Appropriate positioning of the ribbon and coil shaped biopsy marking clips at the site of the 330 in 3 o'clock left breast masses respectively. 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 This was  found to be concordant by Dr. Edwin Cap. Pathology revealed INTRAMAMMARY LYMPH NODE- NO CARCINOMA IDENTIFIED of the LEFT breast, 4 o'clock, coil clip . This was found to be concordant by Dr. Edwin Cap. Pathology revealed BENIGN FIBROADIPOSE TISSUE WITH MINIMAL INFLAMMATION- NO CARCINOMA IDENTIFIED of the RIGHT breast, 8 o'clock, heart clip. This was found to be concordant by Dr. Edwin Cap. Pathology results were discussed with the patient by telephone. The patient reported doing well after the biopsies with  tenderness at the sites. Post biopsy instructions and care were reviewed and questions were answered. The patient was encouraged to call [facility] for any additional concerns. The patient has a recent diagnosis of left breast cancer and should follow her outlined treatment plan. Pathology results reported by Collene Mares RN on 08/29/2020. Electronically Signed   By: Edwin Cap M.D.   On: 08/29/2020 09:44   Result Date: 08/30/2020 CLINICAL DATA:  Patient presents for ultrasound-guided biopsy of a mass in the left breast at the 3 o'clock position, ultrasound-guided biopsy of a mass in the left breast at the 4 o'clock position and ultrasound-guided biopsy of a mass in the right breast at the 8 o'clock position. EXAM: ULTRASOUND GUIDED BILATERAL BREAST CORE NEEDLE BIOPSY COMPARISON:  Previous exam(s). PROCEDURE: I met with the patient and we discussed the procedure of ultrasound-guided biopsy, including benefits and alternatives. We discussed the high likelihood of a successful procedure. We discussed the risks of the procedure, including infection, bleeding, tissue injury, clip migration, and inadequate sampling. Informed written consent was given. The usual time-out protocol was performed immediately prior to the procedure. SITE 1: LEFT BREAST 3 O'CLOCK MASS: Lesion quadrant: UPPER-OUTER Using sterile technique and 1% Lidocaine as local anesthetic, under direct ultrasound visualization, a 14 gauge spring-loaded device was used to perform biopsy of the irregular mass in the left breast at the 3 o'clock position using a lateral to medial approach. At the conclusion of the procedure a heart shaped tissue marker clip was deployed into the biopsy cavity. SITE 2: LEFT BREAST 4 O'CLOCK MASS: Lesion quadrant: LOWER OUTER Using sterile technique and 1% Lidocaine as local anesthetic, under direct ultrasound visualization, a 14 gauge spring-loaded device was used to perform biopsy of the mass  in the left breast at the 4 o'clock position using a lateral to medial approach. At the conclusion of the procedure a coil shaped tissue marker clip was deployed into the biopsy cavity. SITE 3: RIGHT BREAST MASS: Lesion quadrant: LOWER OUTER Using sterile technique and 1% Lidocaine as local anesthetic, under direct ultrasound visualization, a 14 gauge spring-loaded device was used to perform biopsy of the mass in the right breast at the 8 o'clock position using a lateral to medial approach. At the conclusion of the procedure a heart shaped tissue marker clip was deployed into the biopsy cavity. IMPRESSION: 1. Ultrasound-guided biopsy of the mass in the left breast at the 3 o'clock position, at site of heart shaped biopsy marking clip. 2. Ultrasound-guided biopsy of the mass in the left breast at the 4 o'clock position, at site of coil shaped biopsy marking clip. 3. Ultrasound-guided biopsy of the mass in the right breast at the 8 o'clock position, at site of heart shaped biopsy marking clip. 4. Post biopsy mammography was not performed due to the far lateral positioning of the masses. Electronically Signed: By: Edwin Cap M.D. 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 This was found to be concordant by Dr. Gerome Sam. 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 This was found to be concordant by Dr. Gerome Sam. Pathology results were discussed with the patient by telephone. The patient reported doing well after the biopsies with tenderness at the sites. Post biopsy instructions and care were reviewed and questions were answered. The patient was encouraged to call [facility]  for any additional concerns. My direct phone number was provided. Medical Oncology consultation has been arranged with Dr. Serena Croissant at [facility] on August 24, 2020. Surgical consultation has been arranged with Dr. Emelia Torres at [facility] on August 24, 2020. The patient is scheduled for a breast MRI on August 17, 2020. Dr. Emelia Torres and Marianne Sofia, RN Oncology Navigator, were notified of biopsy results via EPIC message on August 15, 2020. Pathology results reported by Rene Kocher, RN on 08/16/2020. Electronically Signed   By: Gerome Sam III M.D   On: 08/16/2020 08:11   Result Date: 08/17/2020 CLINICAL DATA:  Biopsy of 2 left breast masses. EXAM: ULTRASOUND GUIDED LEFT BREAST CORE NEEDLE BIOPSY COMPARISON:  Previous exam(s). PROCEDURE: I met with the patient and we discussed the procedure of ultrasound-guided biopsy, including benefits and alternatives. We discussed the high likelihood of a successful procedure. We discussed the risks of the procedure, including infection, bleeding, tissue injury, clip migration, and inadequate sampling. Informed written consent was given. The usual time-out protocol was performed immediately prior to the procedure. Lesion quadrant: Left breast 330 Using sterile technique and 1% Lidocaine as local anesthetic, under direct ultrasound visualization, a 14 gauge spring-loaded device was used to perform biopsy of a 330 left breast mass using a lateral approach. At the conclusion of the procedure a ribbon shaped tissue marker clip was deployed into the biopsy cavity. Follow up 2 view mammogram was performed and dictated separately. Lesion quadrant: Left breast 3 o'clock Using sterile technique and 1% Lidocaine as local anesthetic, under direct ultrasound visualization, a 14 gauge spring-loaded device was used to perform biopsy of a 3 o'clock left breast mass using a lateral approach. At the conclusion of the procedure a coil shaped tissue  marker clip was deployed into the biopsy cavity. Follow up 2 view mammogram was performed and dictated separately. IMPRESSION: Ultrasound guided biopsy of a 3 o'clock left breast mass. No apparent complications. 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 This was found to be concordant by Dr. Edwin Cap. Pathology revealed INTRAMAMMARY LYMPH NODE- NO CARCINOMA IDENTIFIED of the LEFT breast, 4 o'clock, coil clip . This was found to be concordant by Dr. Edwin Cap. Pathology revealed BENIGN FIBROADIPOSE TISSUE WITH MINIMAL INFLAMMATION- NO CARCINOMA IDENTIFIED of the RIGHT breast, 8 o'clock, heart clip. This was found to be concordant by Dr. Edwin Cap. Pathology results were discussed with the patient by telephone. The patient reported doing well after the biopsies with tenderness at the sites. Post biopsy instructions and care  were reviewed and questions were answered. The patient was encouraged to call [facility] for any additional concerns. The patient has a recent diagnosis of left breast cancer and should follow her outlined treatment plan. Pathology results reported by Collene Mares RN on 08/29/2020. Electronically Signed   By: Edwin Cap M.D.   On: 08/29/2020 09:44   Result Date: 08/30/2020 CLINICAL DATA:  Patient presents for ultrasound-guided biopsy of a mass in the left breast at the 3 o'clock position, ultrasound-guided biopsy of a mass in the left breast at the 4 o'clock position and ultrasound-guided biopsy of a mass in the right breast at the 8 o'clock position. EXAM: ULTRASOUND GUIDED BILATERAL BREAST CORE NEEDLE BIOPSY COMPARISON:  Previous exam(s). PROCEDURE: I met  with the patient and we discussed the procedure of ultrasound-guided biopsy, including benefits and alternatives. We discussed the high likelihood of a successful procedure. We discussed the risks of the procedure, including infection, bleeding, tissue injury, clip migration, and inadequate sampling. Informed written consent was given. The usual time-out protocol was performed immediately prior to the procedure. SITE 1: LEFT BREAST 3 O'CLOCK MASS: Lesion quadrant: UPPER-OUTER Using sterile technique and 1% Lidocaine as local anesthetic, under direct ultrasound visualization, a 14 gauge spring-loaded device was used to perform biopsy of the irregular mass in the left breast at the 3 o'clock position using a lateral to medial approach. At the conclusion of the procedure a heart shaped tissue marker clip was deployed into the biopsy cavity. SITE 2: LEFT BREAST 4 O'CLOCK MASS: Lesion quadrant: LOWER OUTER Using sterile technique and 1% Lidocaine as local anesthetic, under direct ultrasound visualization, a 14 gauge spring-loaded device was used to perform biopsy of the mass in the left breast at the 4 o'clock position using a lateral to medial approach. At the conclusion of the procedure a coil shaped tissue marker clip was deployed into the biopsy cavity. SITE 3: RIGHT BREAST MASS: Lesion quadrant: LOWER OUTER Using sterile technique and 1% Lidocaine as local anesthetic, under direct ultrasound visualization, a 14 gauge spring-loaded device was used to perform biopsy of the mass in the right breast at the 8 o'clock position using a lateral to medial approach. At the conclusion of the procedure a heart shaped tissue marker clip was deployed into the biopsy cavity. IMPRESSION: 1. Ultrasound-guided biopsy of the mass in the left breast at the 3 o'clock position, at site of heart shaped biopsy marking clip. 2. Ultrasound-guided biopsy of the mass in the left breast at the 4 o'clock position, at site of coil shaped biopsy  marking clip. 3. Ultrasound-guided biopsy of the mass in the right breast at the 8 o'clock position, at site of heart shaped biopsy marking clip. 4. Post biopsy mammography was not performed due to the far lateral positioning of the masses. Electronically Signed: By: Edwin Cap M.D. 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 This was found to be concordant by Dr. Gerome Sam. 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 This was found to be concordant by Dr. Gerome Sam. Pathology results were discussed with the patient by telephone. The patient reported doing well after the biopsies with tenderness at the sites. Post biopsy  instructions and care were reviewed and questions were answered. The patient was encouraged to call [facility] for any additional concerns. My direct phone number was provided. Medical Oncology consultation has been arranged with Dr. Serena Croissant at [facility] on August 24, 2020. Surgical consultation has been arranged with Dr. Emelia Torres at [facility] on August 24, 2020. The patient is scheduled for a breast MRI on August 17, 2020. Dr. Emelia Torres and Marianne Sofia, RN Oncology Navigator, were notified of biopsy results via EPIC message on August 15, 2020. Pathology results reported by Rene Kocher, RN on 08/16/2020. Electronically Signed   By: Gerome Sam III M.D   On: 08/16/2020 08:11   Result Date: 08/17/2020 CLINICAL DATA:  Biopsy of 2 left breast masses. EXAM: ULTRASOUND GUIDED LEFT BREAST CORE NEEDLE BIOPSY COMPARISON:  Previous exam(s). PROCEDURE: I met with the patient and we discussed the procedure of  ultrasound-guided biopsy, including benefits and alternatives. We discussed the high likelihood of a successful procedure. We discussed the risks of the procedure, including infection, bleeding, tissue injury, clip migration, and inadequate sampling. Informed written consent was given. The usual time-out protocol was performed immediately prior to the procedure. Lesion quadrant: Left breast 330 Using sterile technique and 1% Lidocaine as local anesthetic, under direct ultrasound visualization, a 14 gauge spring-loaded device was used to perform biopsy of a 330 left breast mass using a lateral approach. At the conclusion of the procedure a ribbon shaped tissue marker clip was deployed into the biopsy cavity. Follow up 2 view mammogram was performed and dictated separately. Lesion quadrant: Left breast 3 o'clock Using sterile technique and 1% Lidocaine as local anesthetic, under direct ultrasound visualization, a 14 gauge spring-loaded device was used to perform biopsy of a 3 o'clock left breast mass using a lateral approach. At the conclusion of the procedure a coil shaped tissue marker clip was deployed into the biopsy cavity. Follow up 2 view mammogram was performed and dictated separately. IMPRESSION: Ultrasound guided biopsy of a 3 o'clock left breast mass. No apparent complications. Electronically Signed: By: Gerome Sam III M.D On: 08/12/2020 14:49   [place] RT BREAST BX W LOC DEV 1ST LESION IMG BX SPEC US GUIDE  Addendum Date: 08/30/2020   ADDENDUM REPORT: 08/29/2020 09:44 ADDENDUM: Pathology revealed GRADE II-III INVASIVE MAMMARY CARCINOMA WITH LOBULAR FEATURES of the LEFT breast, 3 o'clock, heart clip. This was found to be concordant by Dr. Edwin Cap. Pathology revealed INTRAMAMMARY LYMPH NODE- NO CARCINOMA IDENTIFIED of the LEFT breast, 4 o'clock, coil clip . This was found to be concordant by Dr. Edwin Cap. Pathology revealed BENIGN FIBROADIPOSE TISSUE WITH MINIMAL INFLAMMATION- NO CARCINOMA  IDENTIFIED of the RIGHT breast, 8 o'clock, heart clip. This was found to be concordant by Dr. Edwin Cap. Pathology results were discussed with the patient by telephone. The patient reported doing well after the biopsies with tenderness at the sites. Post biopsy instructions and care were reviewed and questions were answered. The patient was encouraged to call [facility] for any additional concerns. The patient has a recent diagnosis of left breast cancer and should follow her outlined treatment plan. Pathology results reported by Collene Mares RN on 08/29/2020. Electronically Signed   By: Edwin Cap M.D.   On: 08/29/2020 09:44   Result Date: 08/30/2020 CLINICAL DATA:  Patient presents for ultrasound-guided biopsy of a mass in the left breast at the 3 o'clock position, ultrasound-guided biopsy of a  mass in the left breast at the 4 o'clock position and ultrasound-guided biopsy of a mass in the right breast at the 8 o'clock position. EXAM: ULTRASOUND GUIDED BILATERAL BREAST CORE NEEDLE BIOPSY COMPARISON:  Previous exam(s). PROCEDURE: I met with the patient and we discussed the procedure of ultrasound-guided biopsy, including benefits and alternatives. We discussed the high likelihood of a successful procedure. We discussed the risks of the procedure, including infection, bleeding, tissue injury, clip migration, and inadequate sampling. Informed written consent was given. The usual time-out protocol was performed immediately prior to the procedure. SITE 1: LEFT BREAST 3 O'CLOCK MASS: Lesion quadrant: UPPER-OUTER Using sterile technique and 1% Lidocaine as local anesthetic, under direct ultrasound visualization, a 14 gauge spring-loaded device was used to perform biopsy of the irregular mass in the left breast at the 3 o'clock position using a lateral to medial approach. At the conclusion of the procedure a heart shaped tissue marker clip was deployed into the biopsy cavity. SITE  2: LEFT BREAST 4 O'CLOCK MASS: Lesion quadrant: LOWER OUTER Using sterile technique and 1% Lidocaine as local anesthetic, under direct ultrasound visualization, a 14 gauge spring-loaded device was used to perform biopsy of the mass in the left breast at the 4 o'clock position using a lateral to medial approach. At the conclusion of the procedure a coil shaped tissue marker clip was deployed into the biopsy cavity. SITE 3: RIGHT BREAST MASS: Lesion quadrant: LOWER OUTER Using sterile technique and 1% Lidocaine as local anesthetic, under direct ultrasound visualization, a 14 gauge spring-loaded device was used to perform biopsy of the mass in the right breast at the 8 o'clock position using a lateral to medial approach. At the conclusion of the procedure a heart shaped tissue marker clip was deployed into the biopsy cavity. IMPRESSION: 1. Ultrasound-guided biopsy of the mass in the left breast at the 3 o'clock position, at site of heart shaped biopsy marking clip. 2. Ultrasound-guided biopsy of the mass in the left breast at the 4 o'clock position, at site of coil shaped biopsy marking clip. 3. Ultrasound-guided biopsy of the mass in the right breast at the 8 o'clock position, at site of heart shaped biopsy marking clip. 4. Post biopsy mammography was not performed due to the far lateral positioning of the masses. Electronically Signed: By: Edwin Cap M.D. On: 08/25/2020 16:54      IMPRESSION: Recurrent left breast cancer.  The patient will proceed with systemic therapy most likely chemotherapy and Kadcyla.  Surgical approach to be determined at a later date.  We discussed the general recommendations for radiation therapy in the setting of recurrence especially in the setting of recurrence where she received standard of care treatment at initial  diagnosis.  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   Current NCCN  guidelines do not recommend withholding radiation therapy in the setting of this genetic mutation.   PLAN: She will proceed with systemic treatment as above.  At a later date she will proceed with surgery and then be seen in radiation oncology for for additional evaluation.  Total time spent in this encounter was 60 minutes which included reviewing the patient's extensive breast cancer history, MRIs, ultrasounds, CT scans, consultations, follow-ups, surgeries, biopsies, pathology reports, treatment, physical examination, and documentation.   ------------------------------------------------  Billie Lade, PhD, MD  This document serves as a record of services personally performed by Antony Blackbird, MD. It was created  on his behalf by Nikki Dom, a trained medical scribe. The creation of this record is based on the scribe's personal observations and the provider's statements to them. This document has been checked and approved by the attending provider.

## 2020-09-05 NOTE — Progress Notes (Signed)
See md note for nursing evaluation 

## 2020-09-06 ENCOUNTER — Ambulatory Visit (HOSPITAL_COMMUNITY): Payer: BC Managed Care – PPO | Admitting: Physician Assistant

## 2020-09-06 ENCOUNTER — Ambulatory Visit (HOSPITAL_COMMUNITY)
Admission: RE | Admit: 2020-09-06 | Discharge: 2020-09-06 | Disposition: A | Discharge status: Home / Self Care | Payer: BC Managed Care – PPO | Attending: General Surgery | Admitting: General Surgery

## 2020-09-06 ENCOUNTER — Ambulatory Visit (HOSPITAL_COMMUNITY): Payer: BC Managed Care – PPO

## 2020-09-06 ENCOUNTER — Encounter (HOSPITAL_COMMUNITY): Payer: Self-pay | Admitting: General Surgery

## 2020-09-06 ENCOUNTER — Encounter (HOSPITAL_COMMUNITY)
Admission: RE | Disposition: A | Discharge status: Home / Self Care | Payer: Self-pay | Source: Home / Self Care | Attending: General Surgery

## 2020-09-06 ENCOUNTER — Telehealth: Payer: Self-pay | Admitting: Hematology and Oncology

## 2020-09-06 ENCOUNTER — Ambulatory Visit (HOSPITAL_COMMUNITY): Payer: BC Managed Care – PPO | Admitting: Certified Registered Nurse Anesthetist

## 2020-09-06 DIAGNOSIS — Z1501 Genetic susceptibility to malignant neoplasm of breast: Secondary | ICD-10-CM | POA: Insufficient documentation

## 2020-09-06 DIAGNOSIS — C50911 Malignant neoplasm of unspecified site of right female breast: Secondary | ICD-10-CM | POA: Insufficient documentation

## 2020-09-06 DIAGNOSIS — Z419 Encounter for procedure for purposes other than remedying health state, unspecified: Secondary | ICD-10-CM

## 2020-09-06 HISTORY — PX: PORTACATH PLACEMENT: SHX2246

## 2020-09-06 LAB — PREGNANCY, URINE: Preg Test, Ur: NEGATIVE

## 2020-09-06 SURGERY — INSERTION, TUNNELED CENTRAL VENOUS DEVICE, WITH PORT
Anesthesia: General | Site: Breast | Laterality: Right

## 2020-09-06 MED ORDER — ACETAMINOPHEN 500 MG PO TABS
1000.0000 mg | ORAL_TABLET | ORAL | Status: AC
  Administered 2020-09-06: 1000 mg via ORAL
  Filled 2020-09-06: qty 2

## 2020-09-06 MED ORDER — CHLORHEXIDINE GLUCONATE 0.12 % MT SOLN
15.0000 mL | Freq: Once | OROMUCOSAL | Status: AC
  Administered 2020-09-06: 15 mL via OROMUCOSAL

## 2020-09-06 MED ORDER — BUPIVACAINE-EPINEPHRINE 0.25% -1:200000 IJ SOLN
INTRAMUSCULAR | Status: DC | PRN
  Administered 2020-09-06: 7.5 mL

## 2020-09-06 MED ORDER — PROPOFOL 10 MG/ML IV BOLUS
INTRAVENOUS | Status: DC | PRN
  Administered 2020-09-06: 160 mg via INTRAVENOUS

## 2020-09-06 MED ORDER — LIDOCAINE 2% (20 MG/ML) 5 ML SYRINGE
INTRAMUSCULAR | Status: DC | PRN
  Administered 2020-09-06: 60 mg via INTRAVENOUS

## 2020-09-06 MED ORDER — DIPHENHYDRAMINE HCL 50 MG/ML IJ SOLN
INTRAMUSCULAR | Status: AC
  Filled 2020-09-06: qty 1

## 2020-09-06 MED ORDER — MEPERIDINE HCL 50 MG/ML IJ SOLN: 6.2500 mg | INTRAMUSCULAR | Status: DC | PRN

## 2020-09-06 MED ORDER — PROPOFOL 10 MG/ML IV BOLUS
INTRAVENOUS | Status: AC
  Filled 2020-09-06: qty 20

## 2020-09-06 MED ORDER — ORAL CARE MOUTH RINSE: 15.0000 mL | Freq: Once | OROMUCOSAL | Status: AC

## 2020-09-06 MED ORDER — CEFAZOLIN SODIUM-DEXTROSE 2-4 GM/100ML-% IV SOLN
2.0000 g | INTRAVENOUS | Status: AC
  Administered 2020-09-06: 2 g via INTRAVENOUS
  Filled 2020-09-06: qty 100

## 2020-09-06 MED ORDER — PHENYLEPHRINE 40 MCG/ML (10ML) SYRINGE FOR IV PUSH (FOR BLOOD PRESSURE SUPPORT)
PREFILLED_SYRINGE | INTRAVENOUS | Status: AC
  Filled 2020-09-06: qty 10

## 2020-09-06 MED ORDER — DEXAMETHASONE SODIUM PHOSPHATE 10 MG/ML IJ SOLN
INTRAMUSCULAR | Status: AC
  Filled 2020-09-06: qty 1

## 2020-09-06 MED ORDER — FENTANYL CITRATE (PF) 100 MCG/2ML IJ SOLN
INTRAMUSCULAR | Status: DC | PRN
  Administered 2020-09-06 (×2): 50 ug via INTRAVENOUS

## 2020-09-06 MED ORDER — SODIUM CHLORIDE 0.9 % IV SOLN: 250.0000 mL | INTRAVENOUS | Status: DC | PRN

## 2020-09-06 MED ORDER — MIDAZOLAM HCL 2 MG/2ML IJ SOLN
INTRAMUSCULAR | Status: AC
  Filled 2020-09-06: qty 2

## 2020-09-06 MED ORDER — FENTANYL CITRATE (PF) 100 MCG/2ML IJ SOLN
INTRAMUSCULAR | Status: AC
  Administered 2020-09-06: 50 ug via INTRAVENOUS
  Filled 2020-09-06: qty 2

## 2020-09-06 MED ORDER — HEPARIN SOD (PORK) LOCK FLUSH 100 UNIT/ML IV SOLN
INTRAVENOUS | Status: AC
  Filled 2020-09-06: qty 5

## 2020-09-06 MED ORDER — MORPHINE SULFATE (PF) 4 MG/ML IV SOLN: 2.0000 mg | INTRAVENOUS | Status: DC | PRN

## 2020-09-06 MED ORDER — FENTANYL CITRATE (PF) 100 MCG/2ML IJ SOLN
25.0000 ug | INTRAMUSCULAR | Status: DC | PRN
  Administered 2020-09-06: 50 ug via INTRAVENOUS

## 2020-09-06 MED ORDER — ACETAMINOPHEN 650 MG RE SUPP
650.0000 mg | RECTAL | Status: DC | PRN
  Filled 2020-09-06: qty 1

## 2020-09-06 MED ORDER — KETOROLAC TROMETHAMINE 15 MG/ML IJ SOLN: 15.0000 mg | INTRAMUSCULAR | Status: DC

## 2020-09-06 MED ORDER — DEXAMETHASONE SODIUM PHOSPHATE 4 MG/ML IJ SOLN
INTRAMUSCULAR | Status: DC | PRN
  Administered 2020-09-06: 10 mg via INTRAVENOUS

## 2020-09-06 MED ORDER — SODIUM CHLORIDE 0.9 % IV SOLN
Freq: Once | INTRAVENOUS | Status: AC
  Administered 2020-09-06: 500 mL
  Filled 2020-09-06: qty 1.2

## 2020-09-06 MED ORDER — LACTATED RINGERS IV SOLN: INTRAVENOUS | Status: DC

## 2020-09-06 MED ORDER — ONDANSETRON HCL 4 MG/2ML IJ SOLN
INTRAMUSCULAR | Status: AC
  Filled 2020-09-06: qty 2

## 2020-09-06 MED ORDER — MIDAZOLAM HCL 2 MG/2ML IJ SOLN
INTRAMUSCULAR | Status: DC | PRN
  Administered 2020-09-06: 2 mg via INTRAVENOUS

## 2020-09-06 MED ORDER — OXYCODONE HCL 5 MG PO TABS: 5.0000 mg | ORAL_TABLET | Freq: Four times a day (QID) | ORAL | 0 refills | Status: DC | PRN

## 2020-09-06 MED ORDER — ONDANSETRON HCL 4 MG/2ML IJ SOLN
INTRAMUSCULAR | Status: DC | PRN
  Administered 2020-09-06: 4 mg via INTRAVENOUS

## 2020-09-06 MED ORDER — SODIUM CHLORIDE 0.9% FLUSH: 3.0000 mL | INTRAVENOUS | Status: DC | PRN

## 2020-09-06 MED ORDER — DIPHENHYDRAMINE HCL 50 MG/ML IJ SOLN
INTRAMUSCULAR | Status: DC | PRN
  Administered 2020-09-06: 12.5 mg via INTRAVENOUS

## 2020-09-06 MED ORDER — PHENYLEPHRINE 40 MCG/ML (10ML) SYRINGE FOR IV PUSH (FOR BLOOD PRESSURE SUPPORT)
PREFILLED_SYRINGE | INTRAVENOUS | Status: DC | PRN
  Administered 2020-09-06 (×2): 80 ug via INTRAVENOUS

## 2020-09-06 MED ORDER — AMISULPRIDE (ANTIEMETIC) 5 MG/2ML IV SOLN: 10.0000 mg | Freq: Once | INTRAVENOUS | Status: DC | PRN

## 2020-09-06 MED ORDER — HEPARIN SOD (PORK) LOCK FLUSH 100 UNIT/ML IV SOLN
INTRAVENOUS | Status: DC | PRN
  Administered 2020-09-06: 500 [IU]

## 2020-09-06 MED ORDER — OXYCODONE HCL 5 MG PO TABS: 5.0000 mg | ORAL_TABLET | ORAL | Status: DC | PRN

## 2020-09-06 MED ORDER — ACETAMINOPHEN 325 MG PO TABS: 650.0000 mg | ORAL_TABLET | ORAL | Status: DC | PRN

## 2020-09-06 MED ORDER — ENSURE PRE-SURGERY PO LIQD
296.0000 mL | Freq: Once | ORAL | Status: DC
  Filled 2020-09-06: qty 296

## 2020-09-06 MED ORDER — EPHEDRINE SULFATE-NACL 50-0.9 MG/10ML-% IV SOSY
PREFILLED_SYRINGE | INTRAVENOUS | Status: DC | PRN
  Administered 2020-09-06: 5 mg via INTRAVENOUS

## 2020-09-06 MED ORDER — BUPIVACAINE-EPINEPHRINE (PF) 0.25% -1:200000 IJ SOLN
INTRAMUSCULAR | Status: AC
  Filled 2020-09-06: qty 30

## 2020-09-06 MED ORDER — FENTANYL CITRATE (PF) 100 MCG/2ML IJ SOLN
INTRAMUSCULAR | Status: AC
  Filled 2020-09-06: qty 2

## 2020-09-06 SURGICAL SUPPLY — 42 items
ADH SKN CLS APL DERMABOND .7 (GAUZE/BANDAGES/DRESSINGS) ×1
APL PRP STRL LF DISP 70% ISPRP (MISCELLANEOUS) ×1
APL SKNCLS STERI-STRIP NONHPOA (GAUZE/BANDAGES/DRESSINGS)
BAG DECANTER FOR FLEXI CONT (MISCELLANEOUS) ×2 IMPLANT
BENZOIN TINCTURE PRP APPL 2/3 (GAUZE/BANDAGES/DRESSINGS) IMPLANT
BLADE SURG 15 STRL LF DISP TIS (BLADE) ×1 IMPLANT
BLADE SURG 15 STRL SS (BLADE) ×2
BLADE SURG SZ11 CARB STEEL (BLADE) ×2 IMPLANT
CHLORAPREP W/TINT 26 (MISCELLANEOUS) ×2 IMPLANT
COVER SURGICAL LIGHT HANDLE (MISCELLANEOUS) ×2 IMPLANT
COVER WAND RF STERILE (DRAPES) IMPLANT
DECANTER SPIKE VIAL GLASS SM (MISCELLANEOUS) ×2 IMPLANT
DERMABOND ADVANCED (GAUZE/BANDAGES/DRESSINGS) ×1
DERMABOND ADVANCED .7 DNX12 (GAUZE/BANDAGES/DRESSINGS) ×1 IMPLANT
DRAPE C-ARM 42X120 X-RAY (DRAPES) ×2 IMPLANT
DRAPE LAPAROSCOPIC ABDOMINAL (DRAPES) ×2 IMPLANT
DRSG TEGADERM 2-3/8X2-3/4 SM (GAUZE/BANDAGES/DRESSINGS) IMPLANT
DRSG TEGADERM 4X4.75 (GAUZE/BANDAGES/DRESSINGS) ×1 IMPLANT
ELECT REM PT RETURN 15FT ADLT (MISCELLANEOUS) ×2 IMPLANT
GAUZE 4X4 16PLY RFD (DISPOSABLE) ×2 IMPLANT
GAUZE SPONGE 4X4 12PLY STRL (GAUZE/BANDAGES/DRESSINGS) ×1 IMPLANT
GLOVE SURG ENC MOIS LTX SZ7 (GLOVE) ×2 IMPLANT
GLOVE SURG UNDER POLY LF SZ7.5 (GLOVE) ×2 IMPLANT
GOWN STRL REUS W/TWL LRG LVL3 (GOWN DISPOSABLE) ×2 IMPLANT
GOWN STRL REUS W/TWL XL LVL3 (GOWN DISPOSABLE) ×2 IMPLANT
KIT BASIN OR (CUSTOM PROCEDURE TRAY) ×2 IMPLANT
KIT PORT POWER 8FR ISP CVUE (Port) ×1 IMPLANT
KIT TURNOVER KIT A (KITS) ×2 IMPLANT
NDL HYPO 25X1 1.5 SAFETY (NEEDLE) ×1 IMPLANT
NEEDLE HYPO 25X1 1.5 SAFETY (NEEDLE) ×2 IMPLANT
PACK BASIC VI WITH GOWN DISP (CUSTOM PROCEDURE TRAY) ×2 IMPLANT
PENCIL SMOKE EVACUATOR (MISCELLANEOUS) IMPLANT
SUT MNCRL AB 4-0 PS2 18 (SUTURE) ×2 IMPLANT
SUT PROLENE 2 0 SH DA (SUTURE) ×2 IMPLANT
SUT SILK 2 0 (SUTURE)
SUT SILK 2-0 30XBRD TIE 12 (SUTURE) IMPLANT
SUT VIC AB 3-0 SH 27 (SUTURE) ×2
SUT VIC AB 3-0 SH 27XBRD (SUTURE) ×1 IMPLANT
SYR 10ML LL (SYRINGE) ×2 IMPLANT
SYR CONTROL 10ML LL (SYRINGE) ×2 IMPLANT
TOWEL OR 17X26 10 PK STRL BLUE (TOWEL DISPOSABLE) ×2 IMPLANT
TOWEL OR NON WOVEN STRL DISP B (DISPOSABLE) ×2 IMPLANT

## 2020-09-06 NOTE — Transfer of Care (Signed)
Immediate Anesthesia Transfer of Care Note  Patient: Natasha Torres  Procedure(s) Performed: INSERTION PORT-A-CATH (Right Breast)  Patient Location: PACU  Anesthesia Type:General  Level of Consciousness: awake and patient cooperative  Airway & Oxygen Therapy: Patient Spontanous Breathing and Patient connected to face mask  Post-op Assessment: Report given to RN and Post -op Vital signs reviewed and stable  Post vital signs: Reviewed and stable  Last Vitals:  Vitals Value Taken Time  BP 110/62 09/06/20 1000  Temp    Pulse 89 09/06/20 1001  Resp 10 09/06/20 1001  SpO2 100 % 09/06/20 1001  Vitals shown include unvalidated device data.  Last Pain:  Vitals:   09/06/20 0803  TempSrc: Oral         Complications: No complications documented.

## 2020-09-06 NOTE — Discharge Instructions (Signed)
  PORT-A-CATH: POST OP INSTRUCTIONS  Always review your discharge instruction sheet given to you by the facility where your surgery was performed.   1. A prescription for pain medication may be given to you upon discharge. Take your pain medication as prescribed, if needed. If narcotic pain medicine is not needed, then you make take acetaminophen (Tylenol) or ibuprofen (Advil) as needed.  2. Take your usually prescribed medications unless otherwise directed. 3. If you need a refill on your pain medication, please contact our office. All narcotic pain medicine now requires a paper prescription.  Phoned in and fax refills are no longer allowed by law.  Prescriptions will not be filled after 5 pm or on weekends.  4. You should follow a light diet for the remainder of the day after your procedure. 5. Most patients will experience some mild swelling and/or bruising in the area of the incision. It may take several days to resolve. 6. It is common to experience some constipation if taking pain medication after surgery. Increasing fluid intake and taking a stool softener (such as Colace) will usually help or prevent this problem from occurring. A mild laxative (Milk of Magnesia or Miralax) should be taken according to package directions if there are no bowel movements after 48 hours.  7. Unless discharge instructions indicate otherwise, you may remove your bandages 48 hours after surgery, and you may shower at that time. You may have steri-strips (small white skin tapes) in place directly over the incision.  These strips should be left on the skin for 7-10 days.  If your surgeon used Dermabond (skin glue) on the incision, you may shower in 24 hours.  The glue will flake off over the next 2-3 weeks.  8. If your port is left accessed at the end of surgery (needle left in port), the dressing cannot get wet and should only by changed by a healthcare professional. When the port is no longer accessed (when the  needle has been removed), follow step 7.   9. ACTIVITIES:  Limit activity involving your arms for the next 72 hours. Do no strenuous exercise or activity for 1 week. You may drive when you are no longer taking prescription pain medication, you can comfortably wear a seatbelt, and you can maneuver your car. 10.You may need to see your doctor in the office for a follow-up appointment.  Please       check with your doctor.  11.When you receive a new Port-a-Cath, you will get a product guide and        ID card.  Please keep them in case you need them.  WHEN TO CALL YOUR DOCTOR ([phone]): 1. Fever over 101.0 2. Chills 3. Continued bleeding from incision 4. Increased redness and tenderness at the site 5. Shortness of breath, difficulty breathing   The clinic staff is available to answer your questions during regular business hours. Please don't hesitate to call and ask to speak to one of the nurses or medical assistants for clinical concerns. If you have a medical emergency, go to the nearest emergency room or call 911.  A surgeon from [facility] is always on call at the hospital.     For further information, please visit [email or web address]   General Anesthesia, Adult, Care After This sheet gives you information about how to care for yourself after your procedure. Your health care provider may also give you more specific instructions. If you have problems or questions, contact your health care provider.   What can I expect after the procedure? After the procedure, the following side effects are common:  Pain or discomfort at the IV site.  Nausea.  Vomiting.  Sore throat.  Trouble concentrating.  Feeling cold or chills.  Feeling weak or tired.  Sleepiness and fatigue.  Soreness and body aches. These side effects can affect parts of the body that were not involved in surgery. Follow these instructions at home: For the time period you were told by your  health care provider:  Rest.  Do not participate in activities where you could fall or become injured.  Do not drive or use machinery.  Do not drink alcohol.  Do not take sleeping pills or medicines that cause drowsiness.  Do not make important decisions or sign legal documents.  Do not take care of children on your own.   Eating and drinking  Follow any instructions from your health care provider about eating or drinking restrictions.  When you feel hungry, start by eating small amounts of foods that are soft and easy to digest (bland), such as toast. Gradually return to your regular diet.  Drink enough fluid to keep your urine pale yellow.  If you vomit, rehydrate by drinking water, juice, or clear broth. General instructions  If you have sleep apnea, surgery and certain medicines can increase your risk for breathing problems. Follow instructions from your health care provider about wearing your sleep device: ? Anytime you are sleeping, including during daytime naps. ? While taking prescription pain medicines, sleeping medicines, or medicines that make you drowsy.  Have a responsible adult stay with you for the time you are told. It is important to have someone help care for you until you are awake and alert.  Return to your normal activities as told by your health care provider. Ask your health care provider what activities are safe for you.  Take over-the-counter and prescription medicines only as told by your health care provider.  If you smoke, do not smoke without supervision.  Keep all follow-up visits as told by your health care provider. This is important. Contact a health care provider if:  You have nausea or vomiting that does not get better with medicine.  You cannot eat or drink without vomiting.  You have pain that does not get better with medicine.  You are unable to pass urine.  You develop a skin rash.  You have a fever.  You have redness around  your IV site that gets worse. Get help right away if:  You have difficulty breathing.  You have chest pain.  You have blood in your urine or stool, or you vomit blood. Summary  After the procedure, it is common to have a sore throat or nausea. It is also common to feel tired.  Have a responsible adult stay with you for the time you are told. It is important to have someone help care for you until you are awake and alert.  When you feel hungry, start by eating small amounts of foods that are soft and easy to digest (bland), such as toast. Gradually return to your regular diet.  Drink enough fluid to keep your urine pale yellow.  Return to your normal activities as told by your health care provider. Ask your health care provider what activities are safe for you. This information is not intended to replace advice given to you by your health care provider. Make sure you discuss any questions you have with your health care provider.   Document Revised: 02/18/2020 Document Reviewed: 09/17/2019 Elsevier Patient Education  2021 [employer]vier Inc.     

## 2020-09-06 NOTE — Progress Notes (Signed)
Patient Care Team: [name], MD as PCP - General (Internal Medicine) Nicholas Lose, MD as Consulting Physician (Hematology and Oncology) Rolm Bookbinder, MD as Consulting Physician (General Surgery) Irene Limbo, MD as Consulting Physician (Plastic Surgery) Rockwell Germany, RN as Registered Nurse Mauro Kaufmann, RN as Registered Nurse Sylvan Cheese, NP as Nurse Practitioner (Hematology and Oncology) Paula Compton, MD as Consulting Physician (Obstetrics and Gynecology)  DIAGNOSIS:    ICD-10-CM   1. Malignant neoplasm of upper-outer quadrant of left breast in female, estrogen receptor positive ([place])  C50.412 Ambulatory referral to Physical Therapy   Z17.0     SUMMARY OF ONCOLOGIC HISTORY: Oncology History  Breast cancer of upper-outer quadrant of left female breast ([place])  08/13/2014 Mammogram   Left breast: 2 cm circumscribed mass   08/13/2014 Breast US   Left breast: two masses: #1: 1:00 10 x 9 x 13 mmm irregular; #2: 2:00: 16 x 7 x 14 mm; no LAD   08/27/2014 Initial Diagnosis   O/S excisional biopsy: 2 masses showing 1.5 cm and 0.9 cm IDC, Grade 3, ER + (90%), PR- (0%), HER-2 positive (ratio 2.5), Ki67 ~30%, Multifocal   08/27/2014 Clinical Stage   Stage IIA/IIB: T2/T3 N0   09/17/2014 Procedure   MyRisk panel ([employer]) 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 Otherwise negative at APC, ATM, BARD1, BMPR1A, BRCA1, BRCA2, BRIP1, CHD1, CDK4, CDKN2A, CHEK2, MLH1, MSH2, MSH6, MUTYH, NBN, PALB2, PMS2, PTEN, RAD51C, RAD51D, SMAD4, STK11, and TP53   09/20/2014 Breast MRI   RIGHT: 1 x 0.8 x 0.9 cm lobulated border mass in the retroareolar lower slight lateral area. LEFT: hematoma with surrounding adjacent enhancement encompassing a 4.6 x 4.9 x 4.1 cm area.    09/27/2014 - 01/10/2015 Neo-Adjuvant Chemotherapy   Neoadjuvant TCH Perjeta every 3 week 6 followed by Herceptin maintenance completed 09/21/2015   01/13/2015 Breast MRI   Postsurgical changes in left  breast without residual enhancing masses compatible with treatment response   02/10/2015 Definitive Surgery   Bilateral mastectomies: RIGHT: benign.  LEFT: complete path response;  0/3 sentinel nodes   02/10/2015 Pathologic Stage   ypT0 ypN0 ypM0   02/22/2015 -  Anti-estrogen oral therapy   Zoladex with tamoxifen, stopped Zoladex for intolerance, decreased tamoxifen to 10 mg daily.   11/17/2015 Survivorship   Survivorship care plan completed and given to patient    Relapse/Recurrence   Within the upper-outer left breast anterior depth there are 2 adjacent irregular enhancing masses (2.1 cm and 1.5 cm) compatible with biopsy-proven malignancy. Indeterminate enhancing mass within the outer left breast posterior depth (1.8 cm). Indeterminate enhancing mass within the outer lower posterior right breast. 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 ER 80%, PR 0%, Her 2 Positive   08/12/2020 Cancer Staging   Staging form: Breast, AJCC 8th Edition - Pathologic stage from 08/12/2020: Stage IA (pT1b, pN0, cM0, G2, ER+, PR-, HER2+) - Signed by Gardenia Phlegm, NP on 08/24/2020 Stage prefix: Initial diagnosis Histologic grading system: 3 grade system   09/07/2020 -  Chemotherapy    Patient is on Treatment Plan: BREAST ADO-TRASTUZUMAB EMTANSINE (KADCYLA) Q21D        CHIEF COMPLIANT: Cycle 1 Kadcyla   INTERVAL HISTORY: Natasha Torres is a 40 y.o. with above-mentioned history of recurrent left breast cancer currently on chemotherapy with Kadcyla. Her port was placed by Dr. Donne Hazel on 09/06/20. She presentsto the clinic today for treatment.   ALLERGIES:  is  allergic to buprenorphine hcl, morphine and related, other, penicillins, and tegaderm ag mesh [silver].  MEDICATIONS:  Current Outpatient Medications  Medication Sig Dispense Refill  . Biotin 5000 MCG CAPS Take 5,000 mcg by mouth at bedtime.    . Cholecalciferol (VITAMIN D3) 50 MCG (2000 UT)  TABS Take 2,000 Units by mouth at bedtime.    [name] ibuprofen (ADVIL) 200 MG tablet Take 600 mg by mouth every 8 (eight) hours as needed (pain). (Patient not taking: Reported on 09/05/2020)    . lidocaine-prilocaine (EMLA) cream Apply to affected area once 30 g 3  . loratadine (CLARITIN) 10 MG tablet Take 10 mg by mouth daily.    [name] LORazepam (ATIVAN) 0.5 MG tablet Take 1 tablet (0.5 mg total) by mouth every 6 (six) hours as needed for anxiety. (Patient taking differently: Take 0.5 mg by mouth See admin instructions. Take 1 tablet (0.5 mg) by mouth scheduled at bedtime; may take an additional dose during the day if needed for anxiety) 30 tablet 3  . Magnesium Hydroxide 400 MG CHEW Chew 400 mg by mouth at bedtime.    . melatonin 5 MG TABS Take 5 mg by mouth at bedtime.    . Multiple Vitamins-Minerals (ADULT ONE DAILY GUMMIES PO) Take 2 tablets by mouth at bedtime.    . Omega-3 Fatty Acids (FISH OIL) 1200 MG CAPS Take 1,200 mg by mouth at bedtime.    [name] oxyCODONE (OXY IR/ROXICODONE) 5 MG immediate release tablet Take 1 tablet (5 mg total) by mouth every 6 (six) hours as needed for moderate pain, severe pain or breakthrough pain. 8 tablet 0  . Turmeric 500 MG CAPS Take 1 capsule by mouth daily. (Patient taking differently: Take 500 mg by mouth at bedtime.)     No current facility-administered medications for this visit.    PHYSICAL EXAMINATION: ECOG PERFORMANCE STATUS: 1 - Symptomatic but completely ambulatory  Vitals:   09/07/20 1320  BP: 113/77  Pulse: 63  Resp: 20  Temp: (!) 97.5 F (36.4 C)  SpO2: 100%   Filed Weights   09/07/20 1320  Weight: 170 lb 8 oz (77.3 kg)     LABORATORY DATA:  I have reviewed the data as listed CMP Latest Ref Rng & Units 09/01/2020 11/19/2018 11/20/2016  Glucose 70 - 99 mg/dL 96 90 84  BUN 6 - 20 mg/dL 12 17 11.8  Creatinine 0.44 - 1.00 mg/dL 0.66 0.79 0.8  Sodium 135 - 145 mmol/L 140 138 140  Potassium 3.5 - 5.1 mmol/L 4.4 4.1 4.3  Chloride 98 - 111 mmol/L 108  106 -  CO2 22 - 32 mmol/L _0 Calcium 8.9 - 10.3 mg/dL 9.3 8.9 9.6  Total Protein 6.0 - 8.3 g/dL - 7.0 6.9  Total Bilirubin 0.2 - 1.2 mg/dL - 0.4 0.44  Alkaline Phos 39 - 117 U/L - 36(L) 51  AST 0 - 37 U/L - 23 29  ALT 0 - 35 U/L - 14 15    Lab Results  Component Value Date   WBC 7.5 09/07/2020   HGB 10.8 (L) 09/07/2020   HCT 32.7 (L) 09/07/2020   MCV 91.3 09/07/2020   PLT 147 (L) 09/07/2020   NEUTROABS 3.6 09/07/2020    ASSESSMENT & PLAN:  Breast cancer of upper-outer quadrant of left female breast 08/13/2014: Left breast IDC. Neo adj chemo with TCHP foll by Herceptin maintenance (path CR: Bil mastectomies and reconstruction), Tamoxifen (stoped and started due to intolerance) ATM gene mutation  08/12/20 Relapsed Breast  Cancer: Left Breast UOQ2.1 cm and 1.5 cm. Indeterminate 1.8 cm and Right breast indeterminate 1.5 cm.Biopsy revealed IDC with DCIS, ER 80%, PR 0%, HER-2 3+ IHC positive  Bone scans 08/22/2020: Results awaited CT scans: scheduled for 08/24/20 Left breast Biopsy 3 o clock:Grade 2-3 IMC with lobular features ER 60%, PR 10%, Ki-67 20%, HER-2 3+ Rt Breast Biopsy: Benign  Treatment plan: 1.  chemotherapy with Kadcyla for currently unresectable breast cancer 4-6 cycles. 2. Surgery 3. Followed by XRT (have to discuss the pros and cons of radiation given ATM mutation) 4. Further adjuvant antiestrogen therapy (ovarian suppression or oophorectomy plus anastrozole) Adding Zoladex monthly to her treatment plan -------------------------------------------------------------------------------------------------------- Current treatment: Cycle 1 Kadcyla Labs reviewed, chemo consent obtained, chemo education completed Echocardiogram 08/26/2020: EF 50 to 55% Return to clinic in 3 weeks for cycle 2    Orders Placed This Encounter  Procedures  . Ambulatory referral to Physical Therapy    Referral Priority:   Routine    Referral Type:   Physical Medicine    Referral  Reason:   Specialty Services Required    Requested Specialty:   Physical Therapy    Number of Visits Requested:   1   The patient has a good understanding of the overall plan. she agrees with it. she will call with any problems that may develop before the next visit here.  Total time spent: 30 mins including face to face time and time spent for planning, charting and coordination of care  Rulon Eisenmenger, MD, MPH 09/07/2020  I, Molly Dorshimer, am acting as scribe for Dr. Nicholas Lose.  I have reviewed the above documentation for accuracy and completeness, and I agree with the above.

## 2020-09-06 NOTE — Interval H&P Note (Signed)
History and Physical Interval Note:  09/06/2020 8:34 AM  Natasha Torres  has presented today for surgery, with the diagnosis of RECURRENT BREAST CANCER.  The various methods of treatment have been discussed with the patient and family. After consideration of risks, benefits and other options for treatment, the patient has consented to  Procedure(s): INSERTION PORT-A-CATH (N/A) as a surgical intervention.  The patient's history has been reviewed, patient examined, no change in status, stable for surgery.  I have reviewed the patient's chart and labs.  Questions were answered to the patient's satisfaction.     Rolm Bookbinder

## 2020-09-06 NOTE — Anesthesia Procedure Notes (Signed)
Procedure Name: LMA Insertion Date/Time: 09/06/2020 9:12 AM Performed by: Claudia Desanctis, CRNA Pre-anesthesia Checklist: Patient identified, Emergency Drugs available, Suction available, Patient being monitored and Timeout performed Patient Re-evaluated:Patient Re-evaluated prior to induction Oxygen Delivery Method: Circle system utilized Preoxygenation: Pre-oxygenation with 100% oxygen Induction Type: IV induction Ventilation: Mask ventilation without difficulty LMA: LMA inserted LMA Size: 4.0 Number of attempts: 1 Tube secured with: Tape Dental Injury: Teeth and Oropharynx as per pre-operative assessment

## 2020-09-06 NOTE — H&P (Signed)
26 yof who had history that starts six years ago. she has left breast mass. 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 This was er pos at 90%, pr negative, her2 amplified and Ki is 30%. She has undergone mri here that shows breast density c. In the right breast there is lobulated mass that has now been biopsied and is a FA. In the left breast there is another biopsy of subareolar ducts that is dilated ducts and this is recommended for consideration of excision. the right side is negative as is the node biopsy. She had a port placed in [place]. She was then begun on primary chemotherapy for which she has had tch/perjeta. 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 she then underwent bilateral nsm with left ax sn biopsy. she had path CR on left with neg sentinel nodes. She was on zoladex which was stopped. she then has remained on 10 mg tamoxifen which is what she is on now. she noted a left breast mass recently lateral to nac. this shows multiple hypoechoic masses in left breast with two measuring 6 mm and 4 mm in largest dimension. she has undergone mri that shows a 1.5x1.1 cm area of enhancement in the lower outer posterior right breast and in the left breasts notes a 2.1 x 1.2 cm mass that is biopsied cancer (clips 7 mm apart) and there is additional areaa measuring 1.8 cm far posterior at 3 oclock. nodes are all normal. biopsy of the breast masses shows grade III idc with dcis that is er pos at 67, pr neg, her 2 pos, Ki 50%. she has negative bone scan. she has seen oncology and we have discussed at conference already as well   Past Surgical History Illene Regulus, CMA; 08/24/2020 10:32 AM) Sentinel Lymph Node Biopsy   Diagnostic Studies History Lars Mage Spillers, CMA; 08/24/2020 10:32 AM) Colonoscopy  never Mammogram  within last year 1-3 years ago  Allergies Illene Regulus, CMA; 08/24/2020 10:34 AM) Morphine Sulfate (Bulk)  *ANALGESICS - OPIOID*   Medication History (Alisha Spillers, CMA; 08/24/2020 10:34 AM) Tamoxifen Citrate (10MG Tablet, Oral) Active. Medications Reconciled  Family History Illene Regulus, CMA; 08/24/2020 10:32 AM) Alcohol Abuse  Family Members In General. Anesthetic complications  Mother. Breast Cancer  Family Members In General. Depression  Brother. Hypertension  Mother. Prostate Cancer  Family Members In General, Father.  Pregnancy / Birth History Illene Regulus, CMA; 08/24/2020 10:32 AM) Age at menarche  76 years, 13 years. Contraceptive History  Contraceptive implant, Depo-provera, Oral contraceptives. Gravida  1 Length (months) of breastfeeding  7-12 Regular periods   Other Problems Lars Mage Spillers, CMA; 08/24/2020 10:32 AM) Anxiety Disorder  Breast Cancer  Congestive Heart Failure     Review of Systems (Alisha Spillers CMA; 08/24/2020 10:32 AM) General Not Present- Appetite Loss, Chills, Fatigue, Fever, Night Sweats, Weight Gain and Weight Loss. Skin Present- Rash. Not Present- Change in Wart/Mole, Dryness, Hives, Jaundice, New Lesions, Non-Healing Wounds and Ulcer. HEENT Present- Wears glasses/contact lenses. Not Present- Earache, Hearing Loss, Hoarseness, Nose Bleed, Oral Ulcers, Ringing in the Ears, Seasonal Allergies, Sinus Pain, Sore Throat, Visual Disturbances and Yellow Eyes. Breast Present- Breast Mass. Not Present- Breast Pain, Nipple Discharge and Skin Changes. Gastrointestinal Not Present- Abdominal Pain, Bloating, Bloody Stool, Change in Bowel Habits, Chronic diarrhea, Constipation, Difficulty Swallowing, Excessive gas, Gets full quickly at meals, Hemorrhoids, Indigestion, Nausea, Rectal Pain and Vomiting. Female Genitourinary Not Present- Frequency, Nocturia, Painful Urination, Pelvic Pain  and Urgency. Musculoskeletal Not Present- Back Pain, Joint Pain, Joint Stiffness, Muscle Pain, Muscle Weakness and Swelling of Extremities. Neurological Not  Present- Decreased Memory, Fainting, Headaches, Numbness, Seizures, Tingling, Tremor, Trouble walking and Weakness. Psychiatric Present- Anxiety and Change in Sleep Pattern. Not Present- Bipolar, Depression, Fearful and Frequent crying.  Vitals (Alisha Spillers CMA; 08/24/2020 10:33 AM) 08/24/2020 10:33 AM Weight: 169 lb Height: 71in Body Surface Area: 1.96 m Body Mass Index: 23.57 kg/m  Pulse: 134 (Regular)  BP: 122/78(Sitting, Left Arm, Standard)       Physical Exam Rolm Bookbinder MD; 08/24/2020 2:11 PM) General Mental Status-Alert. Orientation-Oriented X3.  Breast Nipples-No Discharge. Note: left lateral breast mass measuring about 2.5 cm, mobile and does not appear fixed no right breast mass   Lymphatic Head & Neck  General Head & Neck Lymphatics: Bilateral - Description - Normal. Axillary  General Axillary Region: Bilateral - Description - Normal. Note: no [place] adenopathy   A/P Primary chemotherapy due to disease burden post mastectomy Port placement today.

## 2020-09-06 NOTE — Progress Notes (Addendum)
Pharmacist Chemotherapy Monitoring - Initial Assessment    Anticipated start date: 09/07/20  Regimen:   Are orders appropriate based on the patients diagnosis, regimen, and cycle? Yes  Does the plan date match the patients scheduled date? Yes  Is the sequencing of drugs appropriate? Yes  Are the premedications appropriate for the patients regimen? Yes  Prior Authorization for treatment is: Pending o If applicable, is the correct biosimilar selected based on the patient's insurance? not applicable  Organ Function and Labs:  Are dose adjustments needed based on the patient's renal function, hepatic function, or hematologic function? No  Are appropriate labs ordered prior to the start of patient's treatment? Yes  Other organ system assessment, if indicated: women of childbearing potential: pregnancy status  ECHO wnl  The following baseline labs, if indicated, have been ordered: N/A  Dose Assessment:  Are the drug doses appropriate? Yes  Are the following correct: o Drug concentrations Yes o IV fluid compatible with drug Yes o Administration routes Yes o Timing of therapy Yes  If applicable, does the patient have documented access for treatment and/or plans for port-a-cath placement? yes  If applicable, have lifetime cumulative doses been properly documented and assessed? yes Lifetime Dose Tracking   Carboplatin: 4,500 mg = 0.01 % of the maximum lifetime dose of 999,999,999 mg  o   Toxicity Monitoring/Prevention:  The patient has the following take home antiemetics prescribed: Lorazepam  The patient has the following take home medications prescribed: N/A  Medication allergies and previous infusion related reactions, if applicable, have been reviewed and addressed. Yes  The patient's current medication list has been assessed for drug-drug interactions with their chemotherapy regimen. no significant drug-drug interactions were identified on review.  Order  Review:  Are the treatment plan orders signed? Yes  Is the patient scheduled to see a provider prior to their treatment? Yes  I verify that I have reviewed each item in the above checklist and answered each question accordingly.   Kennith Center, Pharm.D., CPP 09/06/2020@11 :12 AM

## 2020-09-06 NOTE — Op Note (Signed)
Preoperative diagnosis: recurrent breast cancer, her 2 positive Postoperative diagnosis: Same as above Procedure: Rightinternal jugular port placement with ultrasound guidance Surgeon: Dr. Serita Grammes Anesthesia: General  Estimated blood loss:minimal Specimens:none Sponge and needlecount was correct atcompletion Drains: None Disposition recovery stable condition  Indications:39 yof with ATM mutation that has recurrent right breast cancer. Discussed doing primary systemic therapy first.   Procedure: After informed consent was obtainedshe was taken to the OR.She was given antibiotics. SCDs were placed. She was placed under general anesthesia without complication. She was prepped and draped in the standard sterile surgical fashion. A surgical timeout was then performed.  I used the ultrasound to identify therightinternal jugular vein. Under ultrasound guidance I then accessed the vein with the needle. I passed the wire. The wire was in the vein both by ultrasound and by fluoroscopy. I reentered the old incisionon herright chest.I tunneled the line between the 2 sites. I then placed the dilator over the wire. I observed this with fluoroscopy to go in the correct position. I then removedthe wire. I then passed the line. The peel-away sheath was removed. I pulled the line back to be in the superior vena cava.The tip of the line is in the superior vena cava near the cavoatrial junction.I then attached the port. I sutured this into place with 2-0 Prolene. I then closed this with 3-0 Vicryl and 4-0 Monocryl. Glue was placed. Final fluoroscopic image showed the port to be in good position. I then accessed the port and was able to aspirate blood and packed this with heparin. I placed a dressing and left accessed to begin chemotherapy tomorrow. She tolerated well, was transferred to recovery stable.

## 2020-09-06 NOTE — Telephone Encounter (Signed)
Scheduled all appts, called and spoke with patient. Confirmed all

## 2020-09-06 NOTE — Anesthesia Preprocedure Evaluation (Addendum)
Anesthesia Evaluation  Patient identified by MRN, date of birth, ID band Patient awake    Reviewed: Allergy & Precautions, NPO status , Patient's Chart, lab work & pertinent test results  History of Anesthesia Complications (+) Family history of anesthesia reaction  Airway Mallampati: II  TM Distance: >3 FB Neck ROM: Full    Dental no notable dental hx. (+) Dental Advisory Given, Teeth Intact   Pulmonary neg pulmonary ROS,    Pulmonary exam normal breath sounds clear to auscultation       Cardiovascular negative cardio ROS Normal cardiovascular exam Rhythm:Regular Rate:Normal  Echo 08/26/2020 1. Left ventricular ejection fraction, by estimation, is 50 to 55%. The left ventricle has low normal function. The left ventricle has no regional wall motion abnormalities. Left ventricular diastolic parameters were normal. The average left ventricular global longitudinal strain is -26.3 %. The global longitudinal strain is normal.  2. Right ventricular systolic function is normal. The right ventricular size is normal. There is normal pulmonary artery systolic pressure.  3. The mitral valve is normal in structure. No evidence of mitral valve regurgitation. No evidence of mitral stenosis.  4. The aortic valve is tricuspid. Aortic valve regurgitation is not visualized. No aortic stenosis is present.    Neuro/Psych PSYCHIATRIC DISORDERS Anxiety Depression negative neurological ROS     GI/Hepatic negative GI ROS, Neg liver ROS,   Endo/Other  negative endocrine ROS  Renal/GU negative Renal ROS     Musculoskeletal negative musculoskeletal ROS (+)   Abdominal   Peds  Hematology  (+) Blood dyscrasia, anemia ,   Anesthesia Other Findings   Reproductive/Obstetrics                            Anesthesia Physical Anesthesia Plan  ASA: II  Anesthesia Plan: General   Post-op Pain Management:    Induction:  Intravenous  PONV Risk Score and Plan: 3 and Ondansetron, Dexamethasone, Midazolam, Diphenhydramine and Treatment may vary due to age or medical condition  Airway Management Planned: LMA  Additional Equipment: None  Intra-op Plan:   Post-operative Plan: Extubation in OR  Informed Consent: I have reviewed the patients History and Physical, chart, labs and discussed the procedure including the risks, benefits and alternatives for the proposed anesthesia with the patient or authorized representative who has indicated his/her understanding and acceptance.     Dental advisory given  Plan Discussed with: CRNA  Anesthesia Plan Comments:        Anesthesia Quick Evaluation

## 2020-09-07 ENCOUNTER — Other Ambulatory Visit: Payer: Self-pay

## 2020-09-07 ENCOUNTER — Encounter (HOSPITAL_COMMUNITY): Payer: Self-pay | Admitting: General Surgery

## 2020-09-07 ENCOUNTER — Encounter: Payer: Self-pay | Admitting: Licensed Clinical Social Worker

## 2020-09-07 ENCOUNTER — Ambulatory Visit: Payer: BC Managed Care – PPO | Attending: Hematology and Oncology | Admitting: Physical Therapy

## 2020-09-07 ENCOUNTER — Inpatient Hospital Stay: Payer: BC Managed Care – PPO

## 2020-09-07 ENCOUNTER — Ambulatory Visit (HOSPITAL_BASED_OUTPATIENT_CLINIC_OR_DEPARTMENT_OTHER): Payer: BC Managed Care – PPO | Admitting: Medical

## 2020-09-07 ENCOUNTER — Inpatient Hospital Stay (HOSPITAL_BASED_OUTPATIENT_CLINIC_OR_DEPARTMENT_OTHER): Payer: BC Managed Care – PPO | Admitting: Hematology and Oncology

## 2020-09-07 VITALS — BP 113/77 | HR 52 | Temp 98.8°F | Resp 18

## 2020-09-07 DIAGNOSIS — C50412 Malignant neoplasm of upper-outer quadrant of left female breast: Secondary | ICD-10-CM | POA: Insufficient documentation

## 2020-09-07 DIAGNOSIS — Z170 Estrogen receptor positive status [ER+]: Secondary | ICD-10-CM

## 2020-09-07 DIAGNOSIS — T8090XA Unspecified complication following infusion and therapeutic injection, initial encounter: Secondary | ICD-10-CM

## 2020-09-07 DIAGNOSIS — Z95828 Presence of other vascular implants and grafts: Secondary | ICD-10-CM

## 2020-09-07 LAB — CBC WITH DIFFERENTIAL (CANCER CENTER ONLY)
Abs Immature Granulocytes: 0.02 10*3/uL (ref 0.00–0.07)
Basophils Absolute: 0 10*3/uL (ref 0.0–0.1)
Basophils Relative: 0 %
Eosinophils Absolute: 0 10*3/uL (ref 0.0–0.5)
Eosinophils Relative: 1 %
HCT: 32.7 % — ABNORMAL LOW (ref 36.0–46.0)
Hemoglobin: 10.8 g/dL — ABNORMAL LOW (ref 12.0–15.0)
Immature Granulocytes: 0 %
Lymphocytes Relative: 44 %
Lymphs Abs: 3.3 10*3/uL (ref 0.7–4.0)
MCH: 30.2 pg (ref 26.0–34.0)
MCHC: 33 g/dL (ref 30.0–36.0)
MCV: 91.3 fL (ref 80.0–100.0)
Monocytes Absolute: 0.6 10*3/uL (ref 0.1–1.0)
Monocytes Relative: 8 %
Neutro Abs: 3.6 10*3/uL (ref 1.7–7.7)
Neutrophils Relative %: 47 %
Platelet Count: 147 10*3/uL — ABNORMAL LOW (ref 150–400)
RBC: 3.58 MIL/uL — ABNORMAL LOW (ref 3.87–5.11)
RDW: 12 % (ref 11.5–15.5)
WBC Count: 7.5 10*3/uL (ref 4.0–10.5)
nRBC: 0 % (ref 0.0–0.2)

## 2020-09-07 LAB — CMP (CANCER CENTER ONLY)
ALT: 16 U/L (ref 0–44)
AST: 27 U/L (ref 15–41)
Albumin: 3.7 g/dL (ref 3.5–5.0)
Alkaline Phosphatase: 36 U/L — ABNORMAL LOW (ref 38–126)
Anion gap: 8 (ref 5–15)
BUN: 6 mg/dL (ref 6–20)
CO2: 25 mmol/L (ref 22–32)
Calcium: 8.6 mg/dL — ABNORMAL LOW (ref 8.9–10.3)
Chloride: 105 mmol/L (ref 98–111)
Creatinine: 0.79 mg/dL (ref 0.44–1.00)
GFR, Estimated: >60 mL/min (ref >60)
Glucose, Bld: 110 mg/dL — ABNORMAL HIGH (ref 70–99)
Potassium: 3.7 mmol/L (ref 3.5–5.1)
Sodium: 138 mmol/L (ref 135–145)
Total Bilirubin: 0.3 mg/dL (ref 0.3–1.2)
Total Protein: 6.2 g/dL — ABNORMAL LOW (ref 6.5–8.1)

## 2020-09-07 MED ORDER — GOSERELIN ACETATE 3.6 MG ~~LOC~~ IMPL
DRUG_IMPLANT | SUBCUTANEOUS | Status: AC
  Filled 2020-09-07: qty 3.6

## 2020-09-07 MED ORDER — ACETAMINOPHEN 325 MG PO TABS
ORAL_TABLET | ORAL | Status: AC
  Filled 2020-09-07: qty 2

## 2020-09-07 MED ORDER — DIPHENHYDRAMINE HCL 25 MG PO CAPS
50.0000 mg | ORAL_CAPSULE | Freq: Once | ORAL | Status: AC
  Administered 2020-09-07: 50 mg via ORAL

## 2020-09-07 MED ORDER — GOSERELIN ACETATE 3.6 MG ~~LOC~~ IMPL
3.6000 mg | DRUG_IMPLANT | Freq: Once | SUBCUTANEOUS | Status: AC
  Administered 2020-09-07: 3.6 mg via SUBCUTANEOUS

## 2020-09-07 MED ORDER — SODIUM CHLORIDE 0.9% FLUSH
10.0000 mL | INTRAVENOUS | Status: DC | PRN
  Administered 2020-09-07: 10 mL via INTRAVENOUS
  Filled 2020-09-07: qty 10

## 2020-09-07 MED ORDER — SODIUM CHLORIDE 0.9% FLUSH
10.0000 mL | INTRAVENOUS | Status: DC | PRN
  Administered 2020-09-07: 10 mL
  Filled 2020-09-07: qty 10

## 2020-09-07 MED ORDER — DIPHENHYDRAMINE HCL 25 MG PO CAPS
ORAL_CAPSULE | ORAL | Status: AC
  Filled 2020-09-07: qty 2

## 2020-09-07 MED ORDER — HEPARIN SOD (PORK) LOCK FLUSH 100 UNIT/ML IV SOLN
500.0000 [IU] | Freq: Once | INTRAVENOUS | Status: AC | PRN
  Administered 2020-09-07: 500 [IU]
  Filled 2020-09-07: qty 5

## 2020-09-07 MED ORDER — SODIUM CHLORIDE 0.9 % IV SOLN
Freq: Once | INTRAVENOUS | Status: AC
  Filled 2020-09-07: qty 250

## 2020-09-07 MED ORDER — ACETAMINOPHEN 325 MG PO TABS
650.0000 mg | ORAL_TABLET | Freq: Once | ORAL | Status: AC
  Administered 2020-09-07: 650 mg via ORAL

## 2020-09-07 MED ORDER — SODIUM CHLORIDE 0.9 % IV SOLN
260.0000 mg | Freq: Once | INTRAVENOUS | Status: AC
  Administered 2020-09-07: 260 mg via INTRAVENOUS
  Filled 2020-09-07: qty 8

## 2020-09-07 NOTE — Patient Instructions (Signed)

## 2020-09-07 NOTE — Assessment & Plan Note (Signed)
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 Indeterminate 1.8 cm and Right breast indeterminate 1.5 cm.Biopsy revealed IDC with DCIS, ER 80%, PR 0%, HER-2 3+ IHC positive  Bone scans 08/22/2020: Results awaited CT scans: scheduled for 08/24/20 Left breast Biopsy 3 o clock:Grade 2-3 IMC with lobular features ER 60%, PR 10%, Ki-67 20%, HER-2 3+ Rt Breast Biopsy: Benign  Treatment plan: 1.  chemotherapy with Kadcyla for currently unresectable breast cancer. 2. Surgery 3. Followed by XRT 4. Further adjuvant antiestrogen therapy (ovarian suppression or oophorectomy plus anastrozole) -------------------------------------------------------------------------------------------------------- Current treatment: Cycle 1 Kadcyla Labs reviewed, chemo consent obtained, chemo education completed Return to clinic in 3 weeks for cycle 2

## 2020-09-07 NOTE — Progress Notes (Signed)
[facility] CSW Progress Note  Holiday representative met with patient in infusion for ongoing supportive counsel. Patient reports feeling better than at first visit, although still becoming teary a few times each day for 5-10 minutes when worries about the future or her daughter come to mind.  She is receiving good support from work and her family and the discussion with her daughter seemed to go well. Her mom is currently staying with her and will likely go back with her dad (who is recovering from his own cancer surgery) next week.  Patient is trying to stay hopeful, particularly since she is starting with Kadcyla instead of chemotherapy. She is also considering making lifestyle changes with diet and alcohol.  She continues to process and grieve the loss of health and disruption of plans (such as dating again).  CSW gave space for patient to process and normalized feelings. Discussed that there is strength not only in the good moments, but in moments of difficulty and vulnerability.   CSW will continue to follow patient through treatment. Patient may call as needed between visits.  ONCBCN DISTRESS SCREENING 09/05/2020  Screening Type Initial Screening  Distress experienced in past week (1-10) 7  Practical problem type Housing;Work/school;Childcare  Emotional problem type Nervousness/Anxiety;Adjusting to illness;Adjusting to appearance changes  Information Concerns Type   Physical Problem type     Christeen Douglas , LCSW

## 2020-09-07 NOTE — Patient Instructions (Signed)
[facility] Discharge Instructions for Patients Receiving Chemotherapy  Today you received the following chemotherapy agents kadcyla  To help prevent nausea and vomiting after your treatment, we encourage you to take your nausea medication as directed   If you develop nausea and vomiting that is not controlled by your nausea medication, call the clinic.   BELOW ARE SYMPTOMS THAT SHOULD BE REPORTED IMMEDIATELY:  *FEVER GREATER THAN 100.5 F  *CHILLS WITH OR WITHOUT FEVER  NAUSEA AND VOMITING THAT IS NOT CONTROLLED WITH YOUR NAUSEA MEDICATION  *UNUSUAL SHORTNESS OF BREATH  *UNUSUAL BRUISING OR BLEEDING  TENDERNESS IN MOUTH AND THROAT WITH OR WITHOUT PRESENCE OF ULCERS  *URINARY PROBLEMS  *BOWEL PROBLEMS  UNUSUAL RASH Items with * indicate a potential emergency and should be followed up as soon as possible.  Feel free to call the clinic should you have any questions or concerns. The clinic phone number is [phone].  Please show the CHEMO ALERT CARD at check-in to the Emergency Department and triage nurse.   

## 2020-09-07 NOTE — Progress Notes (Signed)
35 minutes after starting first time Kadcyla  pt c/o chills & shivering. Kadcyla paused. BP73/55, HR86, O2100%, T98.9. Sandi Mealy, PA called and NS started. Pepcid 20mg  given. Pt BP 106/46, HR68, O2100%. Pt states she feels better. Kadcyla restarted 15 minutes post pepcid infusion. Pt tolerated well.

## 2020-09-07 NOTE — Therapy (Signed)
[place], [place], [address] Phone: [phone]   Fax:  [phone]  Physical Therapy Evaluation  Patient Details  Name: Natasha Torres MRN: [id number] Date of Birth: [birth year] Referring Provider (PT): Dr. Nicholas Lose   Encounter Date: 09/07/2020   PT End of Session - 09/07/20 1405    Visit Number 1    Number of Visits 2    Date for PT Re-Evaluation 03/10/21    PT Start Time 2025    PT Stop Time 1405    PT Time Calculation (min) 30 min    Activity Tolerance Patient tolerated treatment well    Behavior During Therapy [facility] for tasks assessed/performed           Past Medical History:  Diagnosis Date  . Anemia    takes iron supplement  . Anxiety   . Family history of adverse reaction to anesthesia    pt's mother has hx. of post-op N/V  . History of breast cancer 2016  . left breast ca 08/2014  . Recurrent breast cancer, left ([employer]) dx'd 07/2020  . Tattoo of skin 06/07/2016   new tattoo right ankle    Past Surgical History:  Procedure Laterality Date  . BREAST LUMPECTOMY Left 08/2014  . BREAST RECONSTRUCTION WITH PLACEMENT OF TISSUE EXPANDER AND FLEX HD (ACELLULAR HYDRATED DERMIS) Bilateral 02/10/2015   Procedure: BREAST RECONSTRUCTION WITH PLACEMENT OF TISSUE EXPANDER AND FLEX HD (ACELLULAR HYDRATED DERMIS);  Surgeon: Irene Limbo, MD;  Location: [facility];  Service: Plastics;  Laterality: Bilateral;  . INCISION AND DRAINAGE OF WOUND Left 03/02/2015   Procedure: IRRIGATION BREAST POCKET AND EXCHANGE OF LEFT BREAST TISSUE EXPANDER;  Surgeon: Irene Limbo, MD;  Location: [facility];  Service: Plastics;  Laterality: Left;  . LIPOSUCTION WITH LIPOFILLING Bilateral 06/07/2015   Procedure: LIPOSUCTION WITH LIPOFILLING TO BILATERAL CHEST;  Surgeon: Irene Limbo, MD;  Location: [facility];  Service: Plastics;  Laterality: Bilateral;  . LIPOSUCTION WITH LIPOFILLING Bilateral 09/23/2015    Procedure: LIPOFILLING FROM BILATERAL THIGHS TO BILATERAL CHEST ;  Surgeon: Irene Limbo, MD;  Location: [facility];  Service: Plastics;  Laterality: Bilateral;  . LIPOSUCTION WITH LIPOFILLING Bilateral 06/15/2016   Procedure: LIPOFILLING FROM BILATERAL THIGH TO BILATERAL CHEST;  Surgeon: Irene Limbo, MD;  Location: [facility];  Service: Plastics;  Laterality: Bilateral;  LIPOFILLING FROM BILATERAL THIGH TO BILATERAL CHEST  . MASTECTOMY Bilateral 02/10/2015  . NIPPLE SPARING MASTECTOMY/SENTINAL LYMPH NODE BIOPSY/RECONSTRUCTION/PLACEMENT OF TISSUE EXPANDER Bilateral 02/10/2015   Procedure: BILATERAL  NIPPLE SPARING MASTECTOMY WITH LEFT  SENTINAL LYMPH NODE BIOPSY(RIGHT BREAST PROPHYLACTIC);  Surgeon: Rolm Bookbinder, MD;  Location: [facility];  Service: General;  Laterality: Bilateral;  . ORIF FINGER / THUMB FRACTURE Right 1994   thumb  . PORT-A-CATH REMOVAL  01/07/2015   removed and replaced  . PORT-A-CATH REMOVAL Right 09/23/2015   Procedure: REMOVAL PORT-A-CATH;  Surgeon: Irene Limbo, MD;  Location: [facility];  Service: Plastics;  Laterality: Right;  . PORTACATH PLACEMENT  08/2014; 01/07/2015  . PORTACATH PLACEMENT Right 09/06/2020   Procedure: INSERTION PORT-A-CATH;  Surgeon: Rolm Bookbinder, MD;  Location: [facility];  Service: General;  Laterality: Right;  . REMOVAL OF BILATERAL TISSUE EXPANDERS WITH PLACEMENT OF BILATERAL BREAST IMPLANTS Bilateral 06/07/2015   Procedure: REMOVAL OF BILATERAL TISSUE EXPANDERS WITH PLACEMENT OF BILATERAL BREAST  SILICONE IMPLANTS;  Surgeon: Irene Limbo, MD;  Location: [facility];  Service: Plastics;  Laterality: Bilateral;  . REMOVAL  OF TISSUE EXPANDER AND PLACEMENT OF IMPLANT Right 02/27/2015   Procedure: REMOVAL OF TISSUE EXPANDER AND PLACEMENT OF NEW TISSUE EXPANDER;  Surgeon: Irene Limbo, MD;  Location: [facility];  Service: Plastics;  Laterality: Right;  . SALPINGOOPHORECTOMY Right  11/22/2010  . TISSUE EXPANDER PLACEMENT Left 03/02/2015   Procedure: TISSUE EXPANDER;  Surgeon: Irene Limbo, MD;  Location: [facility];  Service: Plastics;  Laterality: Left;  . WISDOM TOOTH EXTRACTION  2001    There were no vitals filed for this visit.    Subjective Assessment - 09/07/20 1342    Subjective Patient reports she is here today to begin targeted HER2 therapy for a cancer recurrence in her left breast. Her port was placed yesterday.    Patient is accompained by: Family member    Pertinent History Patient was diagnosed in 2016 with left ER and HER2 positive, PR negative breast cancer. She underwent neoadjuvant chemotherapy followed by a bilateral mastectomy with reconstruction with silicone implants in 4734. She had 5 axillary nodes removed. She was diagnosed with a left breast cancer recurrence (same prognostics) 08/15/2020. She will begin Kadcyla today and plans to undergo surgery after Kadcyla.    Currently in Pain? Yes    Pain Score 4     Pain Location Chest    Pain Orientation Right    Pain Descriptors / Indicators Constant    Pain Type Surgical pain    Pain Onset Yesterday    Pain Frequency Constant    Aggravating Factors  moving    Pain Relieving Factors nothing              [facility] PT Assessment - 09/07/20 0001      Assessment   Medical Diagnosis Left breast cancer    Referring Provider (PT) Dr. Nicholas Lose    Onset Date/Surgical Date 08/15/20    Hand Dominance Right    Prior Therapy yes      Precautions   Precautions Other (comment)    Precaution Comments active cancer      Restrictions   Weight Bearing Restrictions No      Balance Screen   Has the patient fallen in the past 6 months No    Has the patient had a decrease in activity level because of a fear of falling?  No    Is the patient reluctant to leave their home because of a fear of falling?  No      Home Environment   Living Environment Private residence    Living Arrangements Children   30  y.o. daughter   Available Help at Discharge Family      Prior Function   Level of Independence Independent    Vocation Full time employment    Air cabin crew; Public affairs consultant at [employer] 30 min videos 3-4x/week; walks 30 min/day      Cognition   Overall Cognitive Status Within Functional Limits for tasks assessed      Posture/Postural Control   Posture/Postural Control No significant limitations      ROM / Strength   AROM / PROM / Strength AROM;Strength      AROM   Overall AROM Comments Cervical AROM all limited due to port placement yesterday; right ROM not tested due to port placed yesterday    AROM Assessment Site Shoulder    Right/Left Shoulder Right;Left    Left Shoulder Extension 68 Degrees    Left Shoulder Flexion 153 Degrees    Left Shoulder  ABduction 172 Degrees    Left Shoulder Internal Rotation 58 Degrees    Left Shoulder External Rotation 90 Degrees      Strength   Overall Strength Within functional limits for tasks performed             LYMPHEDEMA/ONCOLOGY QUESTIONNAIRE - 09/07/20 0001      Type   Cancer Type Left breast cancer      Lymphedema Assessments   Lymphedema Assessments Upper extremities      Right Upper Extremity Lymphedema   10 cm Proximal to Olecranon Process 29.7 cm    Olecranon Process 25.1 cm    10 cm Proximal to Ulnar Styloid Process 21.5 cm    Just Proximal to Ulnar Styloid Process 15.8 cm    Across Hand at [employer] 19.7 cm    At [place] of 2nd Digit 6.2 cm      Left Upper Extremity Lymphedema   10 cm Proximal to Olecranon Process 29 cm    Olecranon Process 24.5 cm    10 cm Proximal to Ulnar Styloid Process 20.3 cm    Just Proximal to Ulnar Styloid Process 15 cm    Across Hand at [employer] 19 cm    At [place] of 2nd Digit 6 cm           L-DEX FLOWSHEETS - 09/07/20 1400      L-DEX LYMPHEDEMA SCREENING   Measurement Type Unilateral    L-DEX MEASUREMENT EXTREMITY Upper  Extremity    POSITION  Standing    DOMINANT SIDE Right    At Risk Side Left    BASELINE SCORE (UNILATERAL) -1.6           The patient was assessed using the L-Dex machine today to produce a lymphedema index baseline score. The patient will be reassessed on a regular basis (typically every 3 months) to obtain new L-Dex scores. If the score is > 6.5 points away from his/her baseline score indicating onset of subclinical lymphedema, it will be recommended to wear a compression garment for 4 weeks, 12 hours per day and then be reassessed. If the score continues to be > 6.5 points from baseline at reassessment, we will initiate lymphedema treatment. Assessing in this manner has a 95% rate of preventing clinically significant lymphedema.      Katina Dung - 09/07/20 0001    Open a tight or new jar No difficulty    Do heavy household chores (wash walls, wash floors) No difficulty    Carry a shopping bag or briefcase No difficulty    Wash your back No difficulty    Use a knife to cut food No difficulty    Recreational activities in which you take some force or impact through your arm, shoulder, or hand (golf, hammering, tennis) No difficulty    During the past week, to what extent has your arm, shoulder or hand problem interfered with your normal social activities with family, friends, neighbors, or groups? Not at all    During the past week, to what extent has your arm, shoulder or hand problem limited your work or other regular daily activities Not at all    Arm, shoulder, or hand pain. None    Tingling (pins and needles) in your arm, shoulder, or hand None    Difficulty Sleeping No difficulty    DASH Score 0 %            Objective measurements completed on examination: See above findings.  Patient was instructed today in a home exercise program today for post op shoulder range of motion. These included active assist shoulder flexion in sitting, scapular retraction, wall walking  with shoulder abduction, and hands behind head external rotation.  She was encouraged to do these twice a day, holding 3 seconds and repeating 5 times when permitted by her physician.            PT Education - 09/07/20 1411    Education Details Lymphedema education; plan for post op care    Person(s) Educated Patient    Methods Explanation;Demonstration    Comprehension Verbalized understanding               PT Long Term Goals - 09/07/20 1417      PT LONG TERM GOAL #1   Title Patient will demonstrate she has regained full shoulder ROM and function post operatively compared to baselines.    Time 6    Period Months    Status New    Target Date 03/10/21           Breast Clinic Goals - 09/07/20 1417      Patient will be able to verbalize understanding of pertinent lymphedema risk reduction practices relevant to her diagnosis specifically related to skin care.   Time 1    Period Days    Status Achieved      Patient will be able to return demonstrate and/or verbalize understanding of the post-op home exercise program related to regaining shoulder range of motion.   Time 1    Period Days    Status Achieved      Patient will be able to verbalize understanding of the importance of attending the postoperative After Breast Cancer Class for further lymphedema risk reduction education and therapeutic exercise.   Time 1    Period Days    Status Achieved                 Plan - 09/07/20 1414    Clinical Impression Statement Patient was diagnosed in 2016 with left ER and HER2 positive, PR negative breast cancer. She underwent neoadjuvant chemotherapy followed by a bilateral mastectomy with reconstruction with silicone implants in 6811. She had 5 axillary nodes removed. She was diagnosed with a left breast cancer recurrence (same prognostics) 08/15/2020. She will begin Kadcyla today and plans to undergo surgery after Kadcyla. She had a baseline assessment performed today  including a SOZO L-Dex score for a baseline. She will benefit from a post op PT reassessment to determine needs and from L-Dex screens every 3 months for 2 years to determine needs.    Stability/Clinical Decision Making Stable/Uncomplicated    Clinical Decision Making Low    PT Frequency --   Eval and 1 f/u visit   PT Treatment/Interventions ADLs/Self Care Home Management;Patient/family education    PT Next Visit Plan Will reassess 3-4 weeks post op    Consulted and Agree with Plan of Care Patient           Patient will benefit from skilled therapeutic intervention in order to improve the following deficits and impairments:  Postural dysfunction,Decreased range of motion,Decreased knowledge of precautions,Impaired UE functional use,Pain  Visit Diagnosis: Malignant neoplasm of upper-outer quadrant of left breast in female, estrogen receptor positive ([place]) - Plan: PT plan of care cert/re-cert   Patient will follow up at outpatient cancer rehab 3-4 weeks following surgery.  If the patient requires physical therapy at that time, a specific plan will  be dictated and sent to the referring physician for approval. The patient was educated today on appropriate basic range of motion exercises to begin post operatively and the importance of attending the After Breast Cancer class following surgery.  Patient was educated today on lymphedema risk reduction practices as it pertains to recommendations that will benefit the patient immediately following surgery.  She verbalized good understanding.      Problem List Patient Active Problem List   Diagnosis Date Noted  . Anxiety and depression 08/28/2018  . Attention deficit disorder 07/25/2016  . Iron deficiency anemia 06/05/2016  . Vitamin D deficiency 06/05/2016  . Chronic fatigue syndrome 05/21/2016  . Lymphedema 04/04/2015  . S/P bilateral mastectomy 02/10/2015  . Breast cancer associated with mutation in ATM gene ([place])   . Breast cancer of  upper-outer quadrant of left female breast ([place]) 09/14/2014   Annia Friendly, PT 09/07/20 2:19 PM  [facility] [place], [place], [address] Phone: [phone]   Fax:  [phone]  Name: Natasha Torres MRN: [id number] Date of Birth: [birth year]

## 2020-09-07 NOTE — Patient Instructions (Addendum)
Implanted Port Insertion, Care After This sheet gives you information about how to care for yourself after your procedure. Your health care provider may also give you more specific instructions. If you have problems or questions, contact your health care provider. What can I expect after the procedure? After the procedure, it is common to have:  Discomfort at the port insertion site.  Bruising on the skin over the port. This should improve over 3-4 days. Follow these instructions at home: Port care  After your port is placed, you will get a manufacturer's information card. The card has information about your port. Keep this card with you at all times.  Take care of the port as told by your health care provider. Ask your health care provider if you or a family member can get training for taking care of the port at home. A home health care nurse may also take care of the port.  Make sure to remember what type of port you have. Incision care  Follow instructions from your health care provider about how to take care of your port insertion site. Make sure you: ? Wash your hands with soap and water before and after you change your bandage (dressing). If soap and water are not available, use hand sanitizer. ? Change your dressing as told by your health care provider. ? Leave stitches (sutures), skin glue, or adhesive strips in place. These skin closures may need to stay in place for 2 weeks or longer. If adhesive strip edges start to loosen and curl up, you may trim the loose edges. Do not remove adhesive strips completely unless your health care provider tells you to do that.  Check your port insertion site every day for signs of infection. Check for: ? Redness, swelling, or pain. ? Fluid or blood. ? Warmth. ? Pus or a bad smell.      Activity  Return to your normal activities as told by your health care provider. Ask your health care provider what activities are safe for you.  Do not  lift anything that is heavier than 10 lb (4.5 kg), or the limit that you are told, until your health care provider says that it is safe. General instructions  Take over-the-counter and prescription medicines only as told by your health care provider.  Do not take baths, swim, or use a hot tub until your health care provider approves. Ask your health care provider if you may take showers. You may only be allowed to take sponge baths.  Do not drive for 24 hours if you were given a sedative during your procedure.  Wear a medical alert bracelet in case of an emergency. This will tell any health care providers that you have a port.  Keep all follow-up visits as told by your health care provider. This is important. Contact a health care provider if:  You cannot flush your port with saline as directed, or you cannot draw blood from the port.  You have a fever or chills.  You have redness, swelling, or pain around your port insertion site.  You have fluid or blood coming from your port insertion site.  Your port insertion site feels warm to the touch.  You have pus or a bad smell coming from the port insertion site. Get help right away if:  You have chest pain or shortness of breath.  You have bleeding from your port that you cannot control. Summary  Take care of the port as told by your   health care provider. Keep the manufacturer's information card with you at all times.  Change your dressing as told by your health care provider.  Contact a health care provider if you have a fever or chills or if you have redness, swelling, or pain around your port insertion site.  Keep all follow-up visits as told by your health care provider. This information is not intended to replace advice given to you by your health care provider. Make sure you discuss any questions you have with your health care provider. Document Revised: 12/31/2017 Document Reviewed: 12/31/2017 Elsevier Patient Education   [place].   Goserelin injection What is this medicine? GOSERELIN (GOE se rel in) is similar to a hormone found in the body. It lowers the amount of sex hormones that the body makes. Men will have lower testosterone levels and women will have lower estrogen levels while taking this medicine. In men, this medicine is used to treat prostate cancer; the injection is either given once per month or once every 12 weeks. A once per month injection (only) is used to treat women with endometriosis, dysfunctional uterine bleeding, or advanced breast cancer. This medicine may be used for other purposes; ask your health care provider or pharmacist if you have questions. COMMON BRAND NAME(S): Zoladex What should I tell my health care provider before I take this medicine? They need to know if you have any of these conditions:  bone problems  diabetes  heart disease  history of irregular heartbeat  an unusual or allergic reaction to goserelin, other medicines, foods, dyes, or preservatives  pregnant or trying to get pregnant  breast-feeding How should I use this medicine? This medicine is for injection under the skin. It is given by a health care professional in a hospital or clinic setting. Talk to your pediatrician regarding the use of this medicine in children. Special care may be needed. Overdosage: If you think you have taken too much of this medicine contact a poison control center or emergency room at once. NOTE: This medicine is only for you. Do not share this medicine with others. What if I miss a dose? It is important not to miss your dose. Call your doctor or health care professional if you are unable to keep an appointment. What may interact with this medicine? Do not take this medicine with any of the following medications:  cisapride  dronedarone  pimozide  thioridazine This medicine may also interact with the following medications:  other medicines that prolong the  QT interval (an abnormal heart rhythm) This list may not describe all possible interactions. Give your health care provider a list of all the medicines, herbs, non-prescription drugs, or dietary supplements you use. Also tell them if you smoke, drink alcohol, or use illegal drugs. Some items may interact with your medicine. What should I watch for while using this medicine? Visit your doctor or health care provider for regular checks on your progress. Your symptoms may appear to get worse during the first weeks of this therapy. Tell your doctor or healthcare provider if your symptoms do not start to get better or if they get worse after this time. Your bones may get weaker if you take this medicine for a long time. If you smoke or frequently drink alcohol you may increase your risk of bone loss. A family history of osteoporosis, chronic use of drugs for seizures (convulsions), or corticosteroids can also increase your risk of bone loss. Talk to your doctor about how  to keep your bones strong. This medicine should stop regular monthly menstruation in women. Tell your doctor if you continue to menstruate. Women should not become pregnant while taking this medicine or for 12 weeks after stopping this medicine. Women should inform their doctor if they wish to become pregnant or think they might be pregnant. There is a potential for serious side effects to an unborn child. Talk to your health care professional or pharmacist for more information. Do not breast-feed an infant while taking this medicine. Men should inform their doctors if they wish to father a child. This medicine may lower sperm counts. Talk to your health care professional or pharmacist for more information. This medicine may increase blood sugar. Ask your healthcare provider if changes in diet or medicines are needed if you have diabetes. What side effects may I notice from receiving this medicine? Side effects that you should report to your  doctor or health care professional as soon as possible:  allergic reactions like skin rash, itching or hives, swelling of the face, lips, or tongue  bone pain  breathing problems  changes in vision  chest pain  feeling faint or lightheaded, falls  fever, chills  pain, swelling, warmth in the leg  pain, tingling, numbness in the hands or feet  signs and symptoms of high blood sugar such as being more thirsty or hungry or having to urinate more than normal. You may also feel very tired or have blurry vision  signs and symptoms of low blood pressure like dizziness; feeling faint or lightheaded, falls; unusually weak or tired  stomach pain  swelling of the ankles, feet, hands  trouble passing urine or change in the amount of urine  unusually high or low blood pressure  unusually weak or tired Side effects that usually do not require medical attention (report to your doctor or health care professional if they continue or are bothersome):  change in sex drive or performance  changes in breast size in both males and females  changes in emotions or moods  headache  hot flashes  irritation at site where injected  loss of appetite  skin problems like acne, dry skin  vaginal dryness This list may not describe all possible side effects. Call your doctor for medical advice about side effects. You may report side effects to FDA at 1-800-FDA-1088. Where should I keep my medicine? This drug is given in a hospital or clinic and will not be stored at home. NOTE: This sheet is a summary. It may not cover all possible information. If you have questions about this medicine, talk to your doctor, pharmacist, or health care provider.  2021 Elsevier/Gold Standard (2018-09-22 14:05:56)

## 2020-09-08 ENCOUNTER — Telehealth: Payer: Self-pay | Admitting: *Deleted

## 2020-09-08 ENCOUNTER — Encounter: Payer: Self-pay | Admitting: *Deleted

## 2020-09-08 ENCOUNTER — Other Ambulatory Visit: Payer: Self-pay | Admitting: *Deleted

## 2020-09-08 MED ORDER — ONDANSETRON HCL 8 MG PO TABS: 8.0000 mg | ORAL_TABLET | Freq: Three times a day (TID) | ORAL | 1 refills | Status: DC | PRN

## 2020-09-08 MED ORDER — PROCHLORPERAZINE MALEATE 10 MG PO TABS: 10.0000 mg | ORAL_TABLET | Freq: Four times a day (QID) | ORAL | 1 refills | Status: DC | PRN

## 2020-09-08 NOTE — Telephone Encounter (Signed)
Pt informed of n/v since pm yesterday. Dr. Lindi Adie nurse sent in prescriptions for anti-nausea medications. Pt request to move kadcyla infusions to Friday d/t the side effects she's experiencing. Scheduling msg placed to move next tx from 4/13 to 4/15 as well as remaining 2 kadcyla treatments. Informed pt scheduling msg sent.

## 2020-09-08 NOTE — Anesthesia Postprocedure Evaluation (Signed)
Anesthesia Post Note  Patient: Natasha Torres  Procedure(s) Performed: INSERTION PORT-A-CATH (Right Breast)     Patient location during evaluation: PACU Anesthesia Type: General Level of consciousness: sedated and patient cooperative Pain management: pain level controlled Vital Signs Assessment: post-procedure vital signs reviewed and stable Respiratory status: spontaneous breathing Cardiovascular status: stable Anesthetic complications: no   No complications documented.  Last Vitals:  Vitals:   09/06/20 1112 09/06/20 1115  BP: 106/64 106/64  Pulse:  (!) 54  Resp: 12   Temp: 36.5 C   SpO2:  100%    Last Pain:  Vitals:   09/06/20 1115  TempSrc:   PainSc: [place]

## 2020-09-08 NOTE — Progress Notes (Signed)
    DATE:  09/07/2020                                          X  CHEMO/IMMUNOTHERAPY REACTION            MD:  Dr. Nicholas Lose   AGENT/BLOOD PRODUCT RECEIVING TODAY:               Trustuzumab Emtansine   AGENT/BLOOD PRODUCT RECEIVING IMMEDIATELY PRIOR TO REACTION:       Trustuzumab Emtansine      VS: BP:      73/55    P:        86       SPO2:        100% on room air  T: 98.9                BP:      123/63   P:        68       SPO2:        100% on room air     REACTION(S):            Chills and hypotension   PREMEDS:      Tylenol and Benadryl   INTERVENTION: Trustuzumab Emtansine was paused and the patient was given Pepcid 20 mg IV x1.   Review of Systems  Review of Systems  Constitutional: Positive for chills. Negative for diaphoresis and fever.  HENT: Negative for trouble swallowing and voice change.   Respiratory: Negative for cough, chest tightness, shortness of breath and wheezing.   Cardiovascular: Negative for chest pain and palpitations.  Gastrointestinal: Negative for abdominal pain, constipation, diarrhea, nausea and vomiting.  Musculoskeletal: Negative for back pain and myalgias.  Neurological: Negative for dizziness, light-headedness and headaches.     Physical Exam  Physical Exam Constitutional:      General: She is not in acute distress.    Appearance: She is not diaphoretic.  HENT:     Head: Normocephalic and atraumatic.  Cardiovascular:     Rate and Rhythm: Normal rate and regular rhythm.     Heart sounds: Normal heart sounds. No murmur heard. No friction rub. No gallop.   Pulmonary:     Effort: Pulmonary effort is normal. No respiratory distress.     Breath sounds: Normal breath sounds. No wheezing or rales.  Skin:    General: Skin is warm and dry.     Findings: No erythema or rash.  Neurological:     Mental Status: She is alert.  Psychiatric:        Mood and Affect: Mood normal.        Behavior: Behavior normal.        Thought Content: Thought  content normal.        Judgment: Judgment normal.     OUTCOME:                 The patient's symptoms abated and she was able to restart and complete trustuzumab emtansine without any additional issues of concern.   Sandi Mealy, MHS, PA-C

## 2020-09-08 NOTE — Progress Notes (Signed)
Received call from pt with complaint of nausea and vomiting x1 episode.  MD notified and verbal orders received for pt to be prescribed Zofran 8 mg p.o Q8 hrs PRN as well as Compazine 10 mg p.o Q6 hrs PRN.  Prescriptions sent to pharmacy on file, pt educated on medication administration and verbalized understanding.

## 2020-09-12 ENCOUNTER — Encounter: Payer: Self-pay | Admitting: Hematology and Oncology

## 2020-09-14 ENCOUNTER — Ambulatory Visit: Payer: BC Managed Care – PPO | Admitting: Hematology and Oncology

## 2020-09-14 ENCOUNTER — Other Ambulatory Visit: Payer: BC Managed Care – PPO

## 2020-09-21 ENCOUNTER — Encounter: Payer: Self-pay | Admitting: Hematology and Oncology

## 2020-09-22 ENCOUNTER — Ambulatory Visit: Payer: BC Managed Care – PPO | Admitting: Hematology and Oncology

## 2020-09-28 ENCOUNTER — Ambulatory Visit: Payer: BC Managed Care – PPO

## 2020-09-28 ENCOUNTER — Other Ambulatory Visit: Payer: BC Managed Care – PPO

## 2020-09-28 ENCOUNTER — Ambulatory Visit: Payer: BC Managed Care – PPO | Admitting: Hematology and Oncology

## 2020-09-28 NOTE — Progress Notes (Signed)
Patient Care Team: [name], MD as PCP - General (Internal Medicine) Nicholas Lose, MD as Consulting Physician (Hematology and Oncology) Rolm Bookbinder, MD as Consulting Physician (General Surgery) Irene Limbo, MD as Consulting Physician (Plastic Surgery) Rockwell Germany, RN as Registered Nurse Mauro Kaufmann, RN as Registered Nurse Sylvan Cheese, NP as Nurse Practitioner (Hematology and Oncology) Paula Compton, MD as Consulting Physician (Obstetrics and Gynecology)  DIAGNOSIS:    ICD-10-CM   1. Malignant neoplasm of upper-outer quadrant of left breast in female, estrogen receptor positive ([place])  C50.412 MR BREAST BILATERAL W WO CONTRAST INC CAD   Z17.0     SUMMARY OF ONCOLOGIC HISTORY: Oncology History  Breast cancer of upper-outer quadrant of left female breast (Natasha Torres)  08/13/2014 Mammogram   Left breast: 2 cm circumscribed mass   08/13/2014 Breast US   Left breast: two masses: #1: 1:00 10 x 9 x 13 mmm irregular; #2: 2:00: 16 x 7 x 14 mm; no LAD   08/27/2014 Initial Diagnosis   O/S excisional biopsy: 2 masses showing 1.5 cm and 0.9 cm IDC, Grade 3, ER + (90%), PR- (0%), HER-2 positive (ratio 2.5), Ki67 ~30%, Multifocal   08/27/2014 Clinical Stage   Stage IIA/IIB: T2/T3 N0   09/17/2014 Procedure   MyRisk panel ([facility]) 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 Otherwise negative at APC, ATM, BARD1, BMPR1A, BRCA1, BRCA2, BRIP1, CHD1, CDK4, CDKN2A, CHEK2, MLH1, MSH2, MSH6, MUTYH, NBN, PALB2, PMS2, PTEN, RAD51C, RAD51D, SMAD4, STK11, and TP53   09/20/2014 Breast MRI   RIGHT: 1 x 0.8 x 0.9 cm lobulated border mass in the retroareolar lower slight lateral area. LEFT: hematoma with surrounding adjacent enhancement encompassing a 4.6 x 4.9 x 4.1 cm area.    09/27/2014 - 01/10/2015 Neo-Adjuvant Chemotherapy   Neoadjuvant TCH [place] every 3 week 6 followed by Herceptin maintenance completed 09/21/2015   01/13/2015 Breast MRI   Postsurgical changes in left  breast without residual enhancing masses compatible with treatment response   02/10/2015 Definitive Surgery   Bilateral mastectomies: RIGHT: benign.  LEFT: complete path response;  0/3 sentinel nodes   02/10/2015 Pathologic Stage   ypT0 ypN0 ypM0   02/22/2015 -  Anti-estrogen oral therapy   Zoladex with tamoxifen, stopped Zoladex for intolerance, decreased tamoxifen to 10 mg daily.   11/17/2015 Survivorship   Survivorship care plan completed and given to patient    Relapse/Recurrence   Within the upper-outer left breast anterior depth there are 2 adjacent irregular enhancing masses (2.1 cm and 1.5 cm) compatible with biopsy-proven malignancy. Indeterminate enhancing mass within the outer left breast posterior depth (1.8 cm). Indeterminate enhancing mass within the outer lower posterior right breast. 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 ER 80%, PR 0%, Her 2 Positive   08/12/2020 Cancer Staging   Staging form: Breast, AJCC 8th Edition - Pathologic stage from 08/12/2020: Stage IA (pT1b, pN0, cM0, G2, ER+, PR-, HER2+) - Signed by Gardenia Phlegm, NP on 08/24/2020 Stage prefix: Initial diagnosis Histologic grading system: 3 grade system   09/07/2020 -  Chemotherapy    Patient is on Treatment Plan: BREAST ADO-TRASTUZUMAB EMTANSINE ([place]) Q21D        CHIEF COMPLIANT: Cycle 2 Kadcyla   INTERVAL HISTORY: Natasha Torres is a 40 y.o. with above-mentioned history of recurrentleft breast cancer currently on chemotherapy with Kadcyla. She presentsto the clinic today for treatment.   After cycle 1 she had profound flushing sensation severe sweating  shaking chills which got better with Pepcid during infusion but after she went home she had the same reaction at night and after several hours she slept and when she woke up she was much better she did have nausea and emesis.  She also had a heavy menstrual cycle which made her more uncomfortable.   She thinks that the tumor is shrinking significantly already.  ALLERGIES:  is allergic to buprenorphine hcl, morphine and related, other, penicillins, and tegaderm ag mesh [silver].  MEDICATIONS:  Current Outpatient Medications  Medication Sig Dispense Refill  . Biotin 5000 MCG CAPS Take 5,000 mcg by mouth at bedtime.    . Cholecalciferol (VITAMIN D3) 50 MCG (2000 UT) TABS Take 2,000 Units by mouth at bedtime.    [name] ibuprofen (ADVIL) 200 MG tablet Take 600 mg by mouth every 8 (eight) hours as needed (pain). (Patient not taking: Reported on 09/05/2020)    . lidocaine-prilocaine (EMLA) cream Apply to affected area once 30 g 3  . loratadine (CLARITIN) 10 MG tablet Take 10 mg by mouth daily.    [name] LORazepam (ATIVAN) 0.5 MG tablet Take 1 tablet (0.5 mg total) by mouth every 6 (six) hours as needed for anxiety. (Patient taking differently: Take 0.5 mg by mouth See admin instructions. Take 1 tablet (0.5 mg) by mouth scheduled at bedtime; may take an additional dose during the day if needed for anxiety) 30 tablet 3  . Magnesium Hydroxide 400 MG CHEW Chew 400 mg by mouth at bedtime.    . Multiple Vitamins-Minerals (ADULT ONE DAILY GUMMIES PO) Take 2 tablets by mouth at bedtime.    . Omega-3 Fatty Acids (FISH OIL) 1200 MG CAPS Take 1,200 mg by mouth at bedtime.    . ondansetron (ZOFRAN) 8 MG tablet Take 1 tablet (8 mg total) by mouth every 8 (eight) hours as needed for nausea or vomiting. 30 tablet 1  . prochlorperazine (COMPAZINE) 10 MG tablet Take 1 tablet (10 mg total) by mouth every 6 (six) hours as needed for nausea or vomiting. 30 tablet 1  . Turmeric 500 MG CAPS Take 1 capsule by mouth daily. (Patient taking differently: Take 500 mg by mouth at bedtime.)     No current facility-administered medications for this visit.    PHYSICAL EXAMINATION: ECOG PERFORMANCE STATUS: 1 - Symptomatic but completely ambulatory  Vitals:   09/29/20 1158  BP: 131/62  Pulse: (!) 55  Resp: 18  Temp: 97.7 F (36.5  C)  SpO2: 100%   Filed Weights   09/29/20 1158  Weight: 163 lb 3.2 oz (74 kg)    LABORATORY DATA:  I have reviewed the data as listed CMP Latest Ref Rng & Units 09/07/2020 09/01/2020 11/19/2018  Glucose 70 - 99 mg/dL 110(H) 96 90  BUN 6 - 20 mg/dL _0 Creatinine 0.44 - 1.00 mg/dL 0.79 0.66 0.79  Sodium 135 - 145 mmol/L 138 140 138  Potassium 3.5 - 5.1 mmol/L 3.7 4.4 4.1  Chloride 98 - 111 mmol/L 105 108 106  CO2 22 - 32 mmol/L _1 Calcium 8.9 - 10.3 mg/dL 8.6(L) 9.3 8.9  Total Protein 6.5 - 8.1 g/dL 6.2(L) - 7.0  Total Bilirubin 0.3 - 1.2 mg/dL 0.3 - 0.4  Alkaline Phos 38 - 126 U/L 36(L) - 36(L)  AST 15 - 41 U/L 27 - 23  ALT 0 - 44 U/L 16 - 14    Lab Results  Component Value Date   WBC 4.1 09/29/2020   HGB  10.8 (L) 09/29/2020   HCT 32.2 (L) 09/29/2020   MCV 88.7 09/29/2020   PLT 163 09/29/2020   NEUTROABS 1.7 09/29/2020    ASSESSMENT & PLAN:  Breast cancer of upper-outer quadrant of left female breast 08/13/2014: Left breast IDC. 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 Indeterminate 1.8 cm and Right breast indeterminate 1.5 cm.Biopsy revealed IDC with DCIS, ER 80%, PR 0%, HER-2 3+ IHC positive  Bone scans 08/22/2020: Results awaited CT scans: scheduled for 08/24/20 Left breast Biopsy 3 o clock:Grade 2-3 IMC with lobular features ER 60%, PR 10%, Ki-67 20%, HER-2 3+ Rt Breast Biopsy: Benign  Treatment plan: 1.chemotherapy with Kadcylafor currently unresectable breast cancer 4-6 cycles. 2. Surgery 3. Followed by XRT (have to discuss the pros and cons of radiation given ATM mutation) 4.Further adjuvant antiestrogen therapy (ovarian suppression or oophorectomy plus anastrozole) Adding Zoladex monthly to her treatment  plan -------------------------------------------------------------------------------------------------------- Current treatment: Cycle 2 Kadcyla Echocardiogram 08/26/2020: EF 50 to 55% Kadcyla toxicities: 1.  Infusion reaction: Patient did better after receiving Pepcid therefore she is going to get premedication with Pepcid. 2. nausea: I have added Zofran to her treatment because of nausea at home.  Patient thinks that the tumor is getting smaller. Our plan is to administer 4 cycles of Kadcyla and then perform a breast MRI.  I sent a request for a breast MRI. Return to clinic in 3 weeks for cycle 3    Orders Placed This Encounter  Procedures  . MR BREAST BILATERAL W WO CONTRAST INC CAD    Standing Status:   Future    Standing Expiration Date:   09/29/2021    Order Specific Question:   If indicated for the ordered procedure, I authorize the administration of contrast media per Radiology protocol    Answer:   Yes    Order Specific Question:   What is the patient's sedation requirement?    Answer:   No Sedation    Order Specific Question:   Does the patient have a pacemaker or implanted devices?    Answer:   No    Order Specific Question:   Preferred imaging location?    Answer:   [facility] (table limit-550lbs)    Order Specific Question:   Release to patient    Answer:   Immediate   The patient has a good understanding of the overall plan. she agrees with it. she will call with any problems that may develop before the next visit here.  Total time spent: 30 mins including face to face time and time spent for planning, charting and coordination of care  Rulon Eisenmenger, MD, MPH 09/29/2020  I, Molly Dorshimer, am acting as scribe for Dr. Nicholas Lose.  I have reviewed the above documentation for accuracy and completeness, and I agree with the above.

## 2020-09-28 NOTE — Assessment & Plan Note (Addendum)
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 Indeterminate 1.8 cm and Right breast indeterminate 1.5 cm.Biopsy revealed IDC with DCIS, ER 80%, PR 0%, HER-2 3+ IHC positive  Bone scans 08/22/2020: Results awaited CT scans: scheduled for 08/24/20 Left breast Biopsy 3 o clock:Grade 2-3 IMC with lobular features ER 60%, PR 10%, Ki-67 20%, HER-2 3+ Rt Breast Biopsy: Benign  Treatment plan: 1.chemotherapy with Kadcylafor currently unresectable breast cancer 4-6 cycles. 2. Surgery 3. Followed by XRT (have to discuss the pros and cons of radiation given ATM mutation) 4.Further adjuvant antiestrogen therapy (ovarian suppression or oophorectomy plus anastrozole) Adding Zoladex monthly to her treatment plan -------------------------------------------------------------------------------------------------------- Current treatment: Cycle 2 Kadcyla Echocardiogram 08/26/2020: EF 50 to 55% Kadcyla toxicities:  Return to clinic in 3 weeks for cycle 3

## 2020-09-29 ENCOUNTER — Inpatient Hospital Stay (HOSPITAL_BASED_OUTPATIENT_CLINIC_OR_DEPARTMENT_OTHER): Payer: BC Managed Care – PPO | Admitting: Hematology and Oncology

## 2020-09-29 ENCOUNTER — Inpatient Hospital Stay: Payer: BC Managed Care – PPO

## 2020-09-29 ENCOUNTER — Encounter: Payer: Self-pay | Admitting: Licensed Clinical Social Worker

## 2020-09-29 ENCOUNTER — Other Ambulatory Visit: Payer: Self-pay

## 2020-09-29 ENCOUNTER — Inpatient Hospital Stay: Payer: BC Managed Care – PPO | Attending: Hematology and Oncology

## 2020-09-29 VITALS — BP 110/58 | HR 55 | Temp 98.2°F

## 2020-09-29 DIAGNOSIS — Z5112 Encounter for antineoplastic immunotherapy: Secondary | ICD-10-CM | POA: Insufficient documentation

## 2020-09-29 DIAGNOSIS — Z170 Estrogen receptor positive status [ER+]: Secondary | ICD-10-CM

## 2020-09-29 DIAGNOSIS — Z5111 Encounter for antineoplastic chemotherapy: Secondary | ICD-10-CM | POA: Insufficient documentation

## 2020-09-29 DIAGNOSIS — Z95828 Presence of other vascular implants and grafts: Secondary | ICD-10-CM

## 2020-09-29 DIAGNOSIS — C50412 Malignant neoplasm of upper-outer quadrant of left female breast: Secondary | ICD-10-CM

## 2020-09-29 LAB — CMP (CANCER CENTER ONLY)
ALT: 25 U/L (ref 0–44)
AST: 35 U/L (ref 15–41)
Albumin: 3.9 g/dL (ref 3.5–5.0)
Alkaline Phosphatase: 52 U/L (ref 38–126)
Anion gap: 8 (ref 5–15)
BUN: 6 mg/dL (ref 6–20)
CO2: 26 mmol/L (ref 22–32)
Calcium: 8.9 mg/dL (ref 8.9–10.3)
Chloride: 109 mmol/L (ref 98–111)
Creatinine: 0.69 mg/dL (ref 0.44–1.00)
GFR, Estimated: >60 mL/min (ref >60)
Glucose, Bld: 89 mg/dL (ref 70–99)
Potassium: 3.6 mmol/L (ref 3.5–5.1)
Sodium: 143 mmol/L (ref 135–145)
Total Bilirubin: 0.3 mg/dL (ref 0.3–1.2)
Total Protein: 6.5 g/dL (ref 6.5–8.1)

## 2020-09-29 LAB — CBC WITH DIFFERENTIAL (CANCER CENTER ONLY)
Abs Immature Granulocytes: 0 10*3/uL (ref 0.00–0.07)
Basophils Absolute: 0 10*3/uL (ref 0.0–0.1)
Basophils Relative: 1 %
Eosinophils Absolute: 0.1 10*3/uL (ref 0.0–0.5)
Eosinophils Relative: 2 %
HCT: 32.2 % — ABNORMAL LOW (ref 36.0–46.0)
Hemoglobin: 10.8 g/dL — ABNORMAL LOW (ref 12.0–15.0)
Immature Granulocytes: 0 %
Lymphocytes Relative: 47 %
Lymphs Abs: 2 10*3/uL (ref 0.7–4.0)
MCH: 29.8 pg (ref 26.0–34.0)
MCHC: 33.5 g/dL (ref 30.0–36.0)
MCV: 88.7 fL (ref 80.0–100.0)
Monocytes Absolute: 0.4 10*3/uL (ref 0.1–1.0)
Monocytes Relative: 10 %
Neutro Abs: 1.7 10*3/uL (ref 1.7–7.7)
Neutrophils Relative %: 40 %
Platelet Count: 163 10*3/uL (ref 150–400)
RBC: 3.63 MIL/uL — ABNORMAL LOW (ref 3.87–5.11)
RDW: 12 % (ref 11.5–15.5)
WBC Count: 4.1 10*3/uL (ref 4.0–10.5)
nRBC: 0 % (ref 0.0–0.2)

## 2020-09-29 MED ORDER — SODIUM CHLORIDE 0.9% FLUSH
10.0000 mL | INTRAVENOUS | Status: DC | PRN
  Administered 2020-09-29: 10 mL
  Filled 2020-09-29: qty 10

## 2020-09-29 MED ORDER — SODIUM CHLORIDE 0.9 % IV SOLN
Freq: Once | INTRAVENOUS | Status: AC
Start: 2020-09-29 — End: 2020-09-29
  Filled 2020-09-29: qty 250

## 2020-09-29 MED ORDER — DIPHENHYDRAMINE HCL 25 MG PO CAPS
50.0000 mg | ORAL_CAPSULE | Freq: Once | ORAL | Status: AC
  Administered 2020-09-29: 50 mg via ORAL

## 2020-09-29 MED ORDER — SODIUM CHLORIDE 0.9% FLUSH
10.0000 mL | INTRAVENOUS | Status: AC | PRN
  Administered 2020-09-29: 10 mL
  Filled 2020-09-29: qty 10

## 2020-09-29 MED ORDER — FAMOTIDINE IN NACL 20-0.9 MG/50ML-% IV SOLN
20.0000 mg | Freq: Once | INTRAVENOUS | Status: AC
  Administered 2020-09-29: 20 mg via INTRAVENOUS

## 2020-09-29 MED ORDER — ACETAMINOPHEN 325 MG PO TABS
ORAL_TABLET | ORAL | Status: AC
  Filled 2020-09-29: qty 2

## 2020-09-29 MED ORDER — FAMOTIDINE IN NACL 20-0.9 MG/50ML-% IV SOLN
INTRAVENOUS | Status: AC
  Filled 2020-09-29: qty 50

## 2020-09-29 MED ORDER — ONDANSETRON HCL 4 MG/2ML IJ SOLN
INTRAMUSCULAR | Status: AC
  Filled 2020-09-29: qty 4

## 2020-09-29 MED ORDER — SODIUM CHLORIDE 0.9 % IV SOLN
3.4000 mg/kg | Freq: Once | INTRAVENOUS | Status: AC
  Administered 2020-09-29: 260 mg via INTRAVENOUS
  Filled 2020-09-29: qty 8

## 2020-09-29 MED ORDER — HEPARIN SOD (PORK) LOCK FLUSH 100 UNIT/ML IV SOLN
500.0000 [IU] | Freq: Once | INTRAVENOUS | Status: AC | PRN
  Administered 2020-09-29: 500 [IU]
  Filled 2020-09-29: qty 5

## 2020-09-29 MED ORDER — ONDANSETRON HCL 4 MG/2ML IJ SOLN
8.0000 mg | Freq: Once | INTRAMUSCULAR | Status: AC
  Administered 2020-09-29: 8 mg via INTRAVENOUS

## 2020-09-29 MED ORDER — DIPHENHYDRAMINE HCL 25 MG PO CAPS
ORAL_CAPSULE | ORAL | Status: AC
  Filled 2020-09-29: qty 2

## 2020-09-29 MED ORDER — ACETAMINOPHEN 325 MG PO TABS
650.0000 mg | ORAL_TABLET | Freq: Once | ORAL | Status: AC
Start: 2020-09-29 — End: 2020-09-29
  Administered 2020-09-29: 650 mg via ORAL

## 2020-09-29 NOTE — Patient Instructions (Signed)
Implanted Port Insertion, Care After This sheet gives you information about how to care for yourself after your procedure. Your health care provider may also give you more specific instructions. If you have problems or questions, contact your health care provider. What can I expect after the procedure? After the procedure, it is common to have:  Discomfort at the port insertion site.  Bruising on the skin over the port. This should improve over 3-4 days. Follow these instructions at home: Port care  After your port is placed, you will get a manufacturer's information card. The card has information about your port. Keep this card with you at all times.  Take care of the port as told by your health care provider. Ask your health care provider if you or a family member can get training for taking care of the port at home. A home health care nurse may also take care of the port.  Make sure to remember what type of port you have. Incision care  Follow instructions from your health care provider about how to take care of your port insertion site. Make sure you: ? Wash your hands with soap and water before and after you change your bandage (dressing). If soap and water are not available, use hand sanitizer. ? Change your dressing as told by your health care provider. ? Leave stitches (sutures), skin glue, or adhesive strips in place. These skin closures may need to stay in place for 2 weeks or longer. If adhesive strip edges start to loosen and curl up, you may trim the loose edges. Do not remove adhesive strips completely unless your health care provider tells you to do that.  Check your port insertion site every day for signs of infection. Check for: ? Redness, swelling, or pain. ? Fluid or blood. ? Warmth. ? Pus or a bad smell.      Activity  Return to your normal activities as told by your health care provider. Ask your health care provider what activities are safe for you.  Do not  lift anything that is heavier than 10 lb (4.5 kg), or the limit that you are told, until your health care provider says that it is safe. General instructions  Take over-the-counter and prescription medicines only as told by your health care provider.  Do not take baths, swim, or use a hot tub until your health care provider approves. Ask your health care provider if you may take showers. You may only be allowed to take sponge baths.  Do not drive for 24 hours if you were given a sedative during your procedure.  Wear a medical alert bracelet in case of an emergency. This will tell any health care providers that you have a port.  Keep all follow-up visits as told by your health care provider. This is important. Contact a health care provider if:  You cannot flush your port with saline as directed, or you cannot draw blood from the port.  You have a fever or chills.  You have redness, swelling, or pain around your port insertion site.  You have fluid or blood coming from your port insertion site.  Your port insertion site feels warm to the touch.  You have pus or a bad smell coming from the port insertion site. Get help right away if:  You have chest pain or shortness of breath.  You have bleeding from your port that you cannot control. Summary  Take care of the port as told by your   health care provider. Keep the manufacturer's information card with you at all times.  Change your dressing as told by your health care provider.  Contact a health care provider if you have a fever or chills or if you have redness, swelling, or pain around your port insertion site.  Keep all follow-up visits as told by your health care provider. This information is not intended to replace advice given to you by your health care provider. Make sure you discuss any questions you have with your health care provider. Document Revised: 12/31/2017 Document Reviewed: 12/31/2017 Elsevier Patient Education   [place].   Goserelin injection What is this medicine? GOSERELIN (GOE se rel in) is similar to a hormone found in the body. It lowers the amount of sex hormones that the body makes. Men will have lower testosterone levels and women will have lower estrogen levels while taking this medicine. In men, this medicine is used to treat prostate cancer; the injection is either given once per month or once every 12 weeks. A once per month injection (only) is used to treat women with endometriosis, dysfunctional uterine bleeding, or advanced breast cancer. This medicine may be used for other purposes; ask your health care provider or pharmacist if you have questions. COMMON BRAND NAME(S): Zoladex What should I tell my health care provider before I take this medicine? They need to know if you have any of these conditions:  bone problems  diabetes  heart disease  history of irregular heartbeat  an unusual or allergic reaction to goserelin, other medicines, foods, dyes, or preservatives  pregnant or trying to get pregnant  breast-feeding How should I use this medicine? This medicine is for injection under the skin. It is given by a health care professional in a hospital or clinic setting. Talk to your pediatrician regarding the use of this medicine in children. Special care may be needed. Overdosage: If you think you have taken too much of this medicine contact a poison control center or emergency room at once. NOTE: This medicine is only for you. Do not share this medicine with others. What if I miss a dose? It is important not to miss your dose. Call your doctor or health care professional if you are unable to keep an appointment. What may interact with this medicine? Do not take this medicine with any of the following medications:  cisapride  dronedarone  pimozide  thioridazine This medicine may also interact with the following medications:  other medicines that prolong the  QT interval (an abnormal heart rhythm) This list may not describe all possible interactions. Give your health care provider a list of all the medicines, herbs, non-prescription drugs, or dietary supplements you use. Also tell them if you smoke, drink alcohol, or use illegal drugs. Some items may interact with your medicine. What should I watch for while using this medicine? Visit your doctor or health care provider for regular checks on your progress. Your symptoms may appear to get worse during the first weeks of this therapy. Tell your doctor or healthcare provider if your symptoms do not start to get better or if they get worse after this time. Your bones may get weaker if you take this medicine for a long time. If you smoke or frequently drink alcohol you may increase your risk of bone loss. A family history of osteoporosis, chronic use of drugs for seizures (convulsions), or corticosteroids can also increase your risk of bone loss. Talk to your doctor about how  to keep your bones strong. This medicine should stop regular monthly menstruation in women. Tell your doctor if you continue to menstruate. Women should not become pregnant while taking this medicine or for 12 weeks after stopping this medicine. Women should inform their doctor if they wish to become pregnant or think they might be pregnant. There is a potential for serious side effects to an unborn child. Talk to your health care professional or pharmacist for more information. Do not breast-feed an infant while taking this medicine. Men should inform their doctors if they wish to father a child. This medicine may lower sperm counts. Talk to your health care professional or pharmacist for more information. This medicine may increase blood sugar. Ask your healthcare provider if changes in diet or medicines are needed if you have diabetes. What side effects may I notice from receiving this medicine? Side effects that you should report to your  doctor or health care professional as soon as possible:  allergic reactions like skin rash, itching or hives, swelling of the face, lips, or tongue  bone pain  breathing problems  changes in vision  chest pain  feeling faint or lightheaded, falls  fever, chills  pain, swelling, warmth in the leg  pain, tingling, numbness in the hands or feet  signs and symptoms of high blood sugar such as being more thirsty or hungry or having to urinate more than normal. You may also feel very tired or have blurry vision  signs and symptoms of low blood pressure like dizziness; feeling faint or lightheaded, falls; unusually weak or tired  stomach pain  swelling of the ankles, feet, hands  trouble passing urine or change in the amount of urine  unusually high or low blood pressure  unusually weak or tired Side effects that usually do not require medical attention (report to your doctor or health care professional if they continue or are bothersome):  change in sex drive or performance  changes in breast size in both males and females  changes in emotions or moods  headache  hot flashes  irritation at site where injected  loss of appetite  skin problems like acne, dry skin  vaginal dryness This list may not describe all possible side effects. Call your doctor for medical advice about side effects. You may report side effects to FDA at 1-800-FDA-1088. Where should I keep my medicine? This drug is given in a hospital or clinic and will not be stored at home. NOTE: This sheet is a summary. It may not cover all possible information. If you have questions about this medicine, talk to your doctor, pharmacist, or health care provider.  2021 Elsevier/Gold Standard (2018-09-22 14:05:56)

## 2020-09-29 NOTE — Patient Instructions (Signed)
[facility] Discharge Instructions for Patients Receiving Chemotherapy  Today you received the following chemotherapy agents kadcyla  To help prevent nausea and vomiting after your treatment, we encourage you to take your nausea medication as directed   If you develop nausea and vomiting that is not controlled by your nausea medication, call the clinic.   BELOW ARE SYMPTOMS THAT SHOULD BE REPORTED IMMEDIATELY:  *FEVER GREATER THAN 100.5 F  *CHILLS WITH OR WITHOUT FEVER  NAUSEA AND VOMITING THAT IS NOT CONTROLLED WITH YOUR NAUSEA MEDICATION  *UNUSUAL SHORTNESS OF BREATH  *UNUSUAL BRUISING OR BLEEDING  TENDERNESS IN MOUTH AND THROAT WITH OR WITHOUT PRESENCE OF ULCERS  *URINARY PROBLEMS  *BOWEL PROBLEMS  UNUSUAL RASH Items with * indicate a potential emergency and should be followed up as soon as possible.  Feel free to call the clinic should you have any questions or concerns. The clinic phone number is [phone].  Please show the CHEMO ALERT CARD at check-in to the Emergency Department and triage nurse.   

## 2020-09-29 NOTE — Progress Notes (Signed)
[facility] CSW Progress Note  Holiday representative met with patient to provide ongoing supportive counseling. Patient reports improvement in mood and coping since last visit. She had a difficult reaction the night of first treatment, but is feeling hopeful now as she has noticed a change in tumor size.  Natasha Torres has made significant changes in diet (mainly plant-based), discontinued drinking alcohol, and has set boundaries with not doing work on weekends.  These have helped regain sense of control and hope. Natasha Torres has been continuing her prayer and has started doing other healthy activities for stress management.    Mood improving overall with only moments of sadness or tears every few days rather than multiple daily. She still worries about her daughter coping, especially after daughter drew a picture of the day pt told her about having cancer. CSW and patient discussed ways that children process and cope and how to continue to support her daughter by allowing space for the conversation and acknowledging feelings.  Natasha Torres is also experiencing some guilt around needing her mom to come out on days she has treatment to support her and feels she should be taking care of them. CSW normalized feelings of sadness and grief around this and reframed how family members often feel benefit from being able to help.  CSW will continue to see patient during treatment.   Natasha Torres , LCSW

## 2020-09-30 ENCOUNTER — Encounter: Payer: Self-pay | Admitting: *Deleted

## 2020-10-05 ENCOUNTER — Other Ambulatory Visit: Payer: Self-pay

## 2020-10-05 ENCOUNTER — Inpatient Hospital Stay: Payer: BC Managed Care – PPO

## 2020-10-05 VITALS — BP 115/75 | HR 58 | Temp 98.4°F | Resp 18

## 2020-10-05 DIAGNOSIS — Z170 Estrogen receptor positive status [ER+]: Secondary | ICD-10-CM

## 2020-10-05 DIAGNOSIS — C50412 Malignant neoplasm of upper-outer quadrant of left female breast: Secondary | ICD-10-CM | POA: Diagnosis not present

## 2020-10-05 MED ORDER — GOSERELIN ACETATE 3.6 MG ~~LOC~~ IMPL
3.6000 mg | DRUG_IMPLANT | Freq: Once | SUBCUTANEOUS | Status: AC
Start: 2020-10-05 — End: 2020-10-05
  Administered 2020-10-05: 3.6 mg via SUBCUTANEOUS

## 2020-10-05 MED ORDER — GOSERELIN ACETATE 3.6 MG ~~LOC~~ IMPL
DRUG_IMPLANT | SUBCUTANEOUS | Status: AC
  Filled 2020-10-05: qty 3.6

## 2020-10-20 NOTE — Assessment & Plan Note (Signed)
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 Indeterminate 1.8 cm and Right breast indeterminate 1.5 cm.Biopsy revealed IDC with DCIS, ER 80%, PR 0%, HER-2 3+ IHC positive  Bone scans 08/22/2020: Results awaited CT scans: scheduled for 08/24/20 Left breast Biopsy 3 o clock:Grade 2-3IMC with lobular featuresER 60%, PR 10%, Ki-67 20%, HER-2 3+ Rt Breast Biopsy: Benign  Treatment plan: 1.chemotherapy with Kadcylafor currently unresectable breast cancer4-6 cycles. 2. Surgery 3. Followed by Jeanie Cooks to discuss the pros and cons of radiation given ATM mutation) 4.Further adjuvant antiestrogen therapy (ovarian suppression or oophorectomy plus anastrozole) Adding Zoladex monthly to her treatment plan -------------------------------------------------------------------------------------------------------- Current treatment: Cycle 3 Kadcyla Echocardiogram 08/26/2020: EF 50 to 55%  Kadcyla toxicities: 1.  Infusion reaction: Patient did better after receiving Pepcid therefore she is going to get premedication with Pepcid. 2. nausea: I have added Zofran to her treatment because of nausea at home.  Patient thinks that the tumor is getting smaller. Our plan is to administer 4 cycles of Kadcyla and then perform a breast MRI.  I sent a request for a breast MRI. Return to clinic in 3 weeks for cycle 3

## 2020-10-20 NOTE — Progress Notes (Signed)
Patient Care Team: [name], MD as PCP - General (Internal Medicine) Nicholas Lose, MD as Consulting Physician (Hematology and Oncology) Rolm Bookbinder, MD as Consulting Physician (General Surgery) Irene Limbo, MD as Consulting Physician (Plastic Surgery) Rockwell Germany, RN as Registered Nurse Mauro Kaufmann, RN as Registered Nurse Sylvan Cheese, NP as Nurse Practitioner (Hematology and Oncology) Paula Compton, MD as Consulting Physician (Obstetrics and Gynecology)  DIAGNOSIS:    ICD-10-CM   1. Malignant neoplasm of upper-outer quadrant of left breast in female, estrogen receptor positive ([place])  C50.412    Z17.0     SUMMARY OF ONCOLOGIC HISTORY: Oncology History  Breast cancer of upper-outer quadrant of left female breast ([place])  08/13/2014 Mammogram   Left breast: 2 cm circumscribed mass   08/13/2014 Breast US   Left breast: two masses: #1: 1:00 10 x 9 x 13 mmm irregular; #2: 2:00: 16 x 7 x 14 mm; no LAD   08/27/2014 Initial Diagnosis   O/S excisional biopsy: 2 masses showing 1.5 cm and 0.9 cm IDC, Grade 3, ER + (90%), PR- (0%), HER-2 positive (ratio 2.5), Ki67 ~30%, Multifocal   08/27/2014 Clinical Stage   Stage IIA/IIB: T2/T3 N0   09/17/2014 Procedure   MyRisk panel ([facility]) 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 Otherwise negative at APC, ATM, BARD1, BMPR1A, BRCA1, BRCA2, BRIP1, CHD1, CDK4, CDKN2A, CHEK2, MLH1, MSH2, MSH6, MUTYH, NBN, PALB2, PMS2, PTEN, RAD51C, RAD51D, SMAD4, STK11, and TP53   09/20/2014 Breast MRI   RIGHT: 1 x 0.8 x 0.9 cm lobulated border mass in the retroareolar lower slight lateral area. LEFT: hematoma with surrounding adjacent enhancement encompassing a 4.6 x 4.9 x 4.1 cm area.    09/27/2014 - 01/10/2015 Neo-Adjuvant Chemotherapy   Neoadjuvant TCH [place] every 3 week 6 followed by Herceptin maintenance completed 09/21/2015   01/13/2015 Breast MRI   Postsurgical changes in left breast without residual enhancing masses  compatible with treatment response   02/10/2015 Definitive Surgery   Bilateral mastectomies: RIGHT: benign.  LEFT: complete path response;  0/3 sentinel nodes   02/10/2015 Pathologic Stage   ypT0 ypN0 ypM0   02/22/2015 -  Anti-estrogen oral therapy   Zoladex with tamoxifen, stopped Zoladex for intolerance, decreased tamoxifen to 10 mg daily.   11/17/2015 Survivorship   Survivorship care plan completed and given to patient    Relapse/Recurrence   Within the upper-outer left breast anterior depth there are 2 adjacent irregular enhancing masses (2.1 cm and 1.5 cm) compatible with biopsy-proven malignancy. Indeterminate enhancing mass within the outer left breast posterior depth (1.8 cm). Indeterminate enhancing mass within the outer lower posterior right breast. 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 ER 80%, PR 0%, Her 2 Positive   08/12/2020 Cancer Staging   Staging form: Breast, AJCC 8th Edition - Pathologic stage from 08/12/2020: Stage IA (pT1b, pN0, cM0, G2, ER+, PR-, HER2+) - Signed by Gardenia Phlegm, NP on 08/24/2020 Stage prefix: Initial diagnosis Histologic grading system: 3 grade system   09/07/2020 -  Chemotherapy    Patient is on Treatment Plan: BREAST ADO-TRASTUZUMAB EMTANSINE (KADCYLA) Q21D        CHIEF COMPLIANT:  Cycle 3 Kadcyla  INTERVAL HISTORY: Natasha Torres is a 40 y.o. with above-mentioned history of recurrentleft breast cancercurrently on chemotherapy with Kadcyla. She presentsto the clinic today for treatment. She reports to have done extremely well with the last cycle of treatment she did not have any reactions to  treatment.  She would like to do her treatment faster at this time. She takes multiple supplements and herbals to maintain her energy levels.   ALLERGIES:  is allergic to buprenorphine hcl, morphine and related, other, penicillins, and tegaderm ag mesh [silver].  MEDICATIONS:  Current  Outpatient Medications  Medication Sig Dispense Refill  . Biotin 5000 MCG CAPS Take 5,000 mcg by mouth at bedtime.    . Cholecalciferol (VITAMIN D3) 50 MCG (2000 UT) TABS Take 2,000 Units by mouth at bedtime.    [name] ibuprofen (ADVIL) 200 MG tablet Take 600 mg by mouth every 8 (eight) hours as needed (pain). (Patient not taking: Reported on 09/05/2020)    . lidocaine-prilocaine (EMLA) cream Apply to affected area once 30 g 3  . loratadine (CLARITIN) 10 MG tablet Take 10 mg by mouth daily.    [name] LORazepam (ATIVAN) 0.5 MG tablet Take 1 tablet (0.5 mg total) by mouth every 6 (six) hours as needed for anxiety. (Patient taking differently: Take 0.5 mg by mouth See admin instructions. Take 1 tablet (0.5 mg) by mouth scheduled at bedtime; may take an additional dose during the day if needed for anxiety) 30 tablet 3  . Magnesium Hydroxide 400 MG CHEW Chew 400 mg by mouth at bedtime.    . Multiple Vitamins-Minerals (ADULT ONE DAILY GUMMIES PO) Take 2 tablets by mouth at bedtime.    . Omega-3 Fatty Acids (FISH OIL) 1200 MG CAPS Take 1,200 mg by mouth at bedtime.    . ondansetron (ZOFRAN) 8 MG tablet Take 1 tablet (8 mg total) by mouth every 8 (eight) hours as needed for nausea or vomiting. 30 tablet 1  . prochlorperazine (COMPAZINE) 10 MG tablet Take 1 tablet (10 mg total) by mouth every 6 (six) hours as needed for nausea or vomiting. 30 tablet 1  . Turmeric 500 MG CAPS Take 1 capsule by mouth daily. (Patient taking differently: Take 500 mg by mouth at bedtime.)     No current facility-administered medications for this visit.    PHYSICAL EXAMINATION: ECOG PERFORMANCE STATUS: 1 - Symptomatic but completely ambulatory  Vitals:   10/21/20 1020  BP: 120/65  Pulse: 69  Resp: 16  Temp: (!) 97.5 F (36.4 C)  SpO2: 100%   Filed Weights   10/21/20 1020  Weight: 163 lb 8 oz (74.2 kg)    LABORATORY DATA:  I have reviewed the data as listed CMP Latest Ref Rng & Units 09/29/2020 09/07/2020 09/01/2020  Glucose  70 - 99 mg/dL 89 110(H) 96  BUN 6 - 20 mg/dL 6 6 12   Creatinine 0.44 - 1.00 mg/dL 0.69 0.79 0.66  Sodium 135 - 145 mmol/L 143 138 140  Potassium 3.5 - 5.1 mmol/L 3.6 3.7 4.4  Chloride 98 - 111 mmol/L 109 105 108  CO2 22 - 32 mmol/L 26 25 25   Calcium 8.9 - 10.3 mg/dL 8.9 8.6(L) 9.3  Total Protein 6.5 - 8.1 g/dL 6.5 6.2(L) -  Total Bilirubin 0.3 - 1.2 mg/dL 0.3 0.3 -  Alkaline Phos 38 - 126 U/L 52 36(L) -  AST 15 - 41 U/L 35 27 -  ALT 0 - 44 U/L 25 16 -    Lab Results  Component Value Date   WBC 4.1 10/21/2020   HGB 11.1 (L) 10/21/2020   HCT 33.4 (L) 10/21/2020   MCV 88.8 10/21/2020   PLT 176 10/21/2020   NEUTROABS 1.7 10/21/2020    ASSESSMENT & PLAN:  Breast cancer of upper-outer quadrant of left female breast  08/13/2014: Left breast IDC. 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 Indeterminate 1.8 cm and Right breast indeterminate 1.5 cm.Biopsy revealed IDC with DCIS, ER 80%, PR 0%, HER-2 3+ IHC positive  Bone scans 08/22/2020: Results awaited CT scans: scheduled for 08/24/20 Left breast Biopsy 3 o clock:Grade 2-3IMC with lobular featuresER 60%, PR 10%, Ki-67 20%, HER-2 3+ Rt Breast Biopsy: Benign  Treatment plan: 1.chemotherapy with Kadcylafor currently unresectable breast cancer4-6 cycles. 2. Surgery 3. Followed by Jeanie Cooks to discuss the pros and cons of radiation given ATM mutation) 4.Further adjuvant antiestrogen therapy (ovarian suppression or oophorectomy plus anastrozole) Adding Zoladex monthly to her treatment plan -------------------------------------------------------------------------------------------------------- Current treatment: Cycle 3 Kadcyla Echocardiogram 08/26/2020: EF 50 to 55%  Kadcyla toxicities: 1.  Infusion reaction: We will go back to standard  infusion rate for today's treatment. 2. nausea: Done extremely well with Zofran    Our plan is to administer 4 cycles of Kadcyla and then perform a breast MRI.  Breast MRI has been scheduled for 11/15/2020. Patient is going on vacation 11/16/2020. She would like to schedule her surgery 11/25/2020 with Dr. Donne Hazel.  I sent a message. Return to clinic in 3 weeks for cycle 4    No orders of the defined types were placed in this encounter.  The patient has a good understanding of the overall plan. she agrees with it. she will call with any problems that may develop before the next visit here.  Total time spent: 30 mins including face to face time and time spent for planning, charting and coordination of care  Rulon Eisenmenger, MD, MPH 10/21/2020  I, Cloyde Reams Dorshimer, am acting as scribe for Dr. Nicholas Lose.  I have reviewed the above documentation for accuracy and completeness, and I agree with the above.

## 2020-10-21 ENCOUNTER — Other Ambulatory Visit: Payer: BC Managed Care – PPO

## 2020-10-21 ENCOUNTER — Encounter: Payer: Self-pay | Admitting: *Deleted

## 2020-10-21 ENCOUNTER — Other Ambulatory Visit: Payer: Self-pay

## 2020-10-21 ENCOUNTER — Inpatient Hospital Stay: Payer: BC Managed Care – PPO

## 2020-10-21 ENCOUNTER — Encounter: Payer: Self-pay | Admitting: Licensed Clinical Social Worker

## 2020-10-21 ENCOUNTER — Inpatient Hospital Stay (HOSPITAL_BASED_OUTPATIENT_CLINIC_OR_DEPARTMENT_OTHER): Payer: BC Managed Care – PPO | Admitting: Hematology and Oncology

## 2020-10-21 ENCOUNTER — Inpatient Hospital Stay: Payer: BC Managed Care – PPO | Attending: Hematology and Oncology

## 2020-10-21 DIAGNOSIS — C50412 Malignant neoplasm of upper-outer quadrant of left female breast: Secondary | ICD-10-CM

## 2020-10-21 DIAGNOSIS — R110 Nausea: Secondary | ICD-10-CM | POA: Insufficient documentation

## 2020-10-21 DIAGNOSIS — Z170 Estrogen receptor positive status [ER+]: Secondary | ICD-10-CM | POA: Diagnosis not present

## 2020-10-21 DIAGNOSIS — Z791 Long term (current) use of non-steroidal anti-inflammatories (NSAID): Secondary | ICD-10-CM | POA: Insufficient documentation

## 2020-10-21 DIAGNOSIS — Z79899 Other long term (current) drug therapy: Secondary | ICD-10-CM | POA: Insufficient documentation

## 2020-10-21 DIAGNOSIS — Z5112 Encounter for antineoplastic immunotherapy: Secondary | ICD-10-CM | POA: Insufficient documentation

## 2020-10-21 DIAGNOSIS — Z5111 Encounter for antineoplastic chemotherapy: Secondary | ICD-10-CM | POA: Insufficient documentation

## 2020-10-21 DIAGNOSIS — Z79810 Long term (current) use of selective estrogen receptor modulators (SERMs): Secondary | ICD-10-CM | POA: Insufficient documentation

## 2020-10-21 LAB — CMP (CANCER CENTER ONLY)
ALT: 40 U/L (ref 0–44)
AST: 48 U/L — ABNORMAL HIGH (ref 15–41)
Albumin: 4 g/dL (ref 3.5–5.0)
Alkaline Phosphatase: 59 U/L (ref 38–126)
Anion gap: 8 (ref 5–15)
BUN: 5 mg/dL — ABNORMAL LOW (ref 6–20)
CO2: 27 mmol/L (ref 22–32)
Calcium: 9.3 mg/dL (ref 8.9–10.3)
Chloride: 107 mmol/L (ref 98–111)
Creatinine: 0.69 mg/dL (ref 0.44–1.00)
GFR, Estimated: >60 mL/min (ref >60)
Glucose, Bld: 85 mg/dL (ref 70–99)
Potassium: 3.7 mmol/L (ref 3.5–5.1)
Sodium: 142 mmol/L (ref 135–145)
Total Bilirubin: 0.3 mg/dL (ref 0.3–1.2)
Total Protein: 6.9 g/dL (ref 6.5–8.1)

## 2020-10-21 LAB — CBC WITH DIFFERENTIAL (CANCER CENTER ONLY)
Abs Immature Granulocytes: 0.01 10*3/uL (ref 0.00–0.07)
Basophils Absolute: 0.1 10*3/uL (ref 0.0–0.1)
Basophils Relative: 1 %
Eosinophils Absolute: 0 10*3/uL (ref 0.0–0.5)
Eosinophils Relative: 1 %
HCT: 33.4 % — ABNORMAL LOW (ref 36.0–46.0)
Hemoglobin: 11.1 g/dL — ABNORMAL LOW (ref 12.0–15.0)
Immature Granulocytes: 0 %
Lymphocytes Relative: 47 %
Lymphs Abs: 1.9 10*3/uL (ref 0.7–4.0)
MCH: 29.5 pg (ref 26.0–34.0)
MCHC: 33.2 g/dL (ref 30.0–36.0)
MCV: 88.8 fL (ref 80.0–100.0)
Monocytes Absolute: 0.4 10*3/uL (ref 0.1–1.0)
Monocytes Relative: 9 %
Neutro Abs: 1.7 10*3/uL (ref 1.7–7.7)
Neutrophils Relative %: 42 %
Platelet Count: 176 10*3/uL (ref 150–400)
RBC: 3.76 MIL/uL — ABNORMAL LOW (ref 3.87–5.11)
RDW: 12.4 % (ref 11.5–15.5)
WBC Count: 4.1 10*3/uL (ref 4.0–10.5)
nRBC: 0 % (ref 0.0–0.2)

## 2020-10-21 MED ORDER — ONDANSETRON HCL 4 MG/2ML IJ SOLN
INTRAMUSCULAR | Status: AC
  Filled 2020-10-21: qty 2

## 2020-10-21 MED ORDER — DIPHENHYDRAMINE HCL 25 MG PO CAPS
ORAL_CAPSULE | ORAL | Status: AC
  Filled 2020-10-21: qty 2

## 2020-10-21 MED ORDER — ONDANSETRON HCL 4 MG/2ML IJ SOLN
INTRAMUSCULAR | Status: AC
  Filled 2020-10-21: qty 4

## 2020-10-21 MED ORDER — SODIUM CHLORIDE 0.9 % IV SOLN: 8.0000 mg | Freq: Once | INTRAVENOUS | Status: DC

## 2020-10-21 MED ORDER — SODIUM CHLORIDE 0.9 % IV SOLN
Freq: Once | INTRAVENOUS | Status: AC
  Filled 2020-10-21: qty 250

## 2020-10-21 MED ORDER — HEPARIN SOD (PORK) LOCK FLUSH 100 UNIT/ML IV SOLN
500.0000 [IU] | Freq: Once | INTRAVENOUS | Status: AC | PRN
  Administered 2020-10-21: 500 [IU]
  Filled 2020-10-21: qty 5

## 2020-10-21 MED ORDER — DIPHENHYDRAMINE HCL 25 MG PO CAPS
50.0000 mg | ORAL_CAPSULE | Freq: Once | ORAL | Status: AC
  Administered 2020-10-21: 50 mg via ORAL

## 2020-10-21 MED ORDER — FAMOTIDINE 20 MG IN NS 100 ML IVPB
INTRAVENOUS | Status: AC
  Filled 2020-10-21: qty 100

## 2020-10-21 MED ORDER — FAMOTIDINE 20 MG IN NS 100 ML IVPB
20.0000 mg | Freq: Once | INTRAVENOUS | Status: AC
  Administered 2020-10-21: 20 mg via INTRAVENOUS

## 2020-10-21 MED ORDER — SODIUM CHLORIDE 0.9% FLUSH
10.0000 mL | INTRAVENOUS | Status: DC | PRN
Start: 2020-10-21 — End: 2020-10-21
  Administered 2020-10-21: 10 mL
  Filled 2020-10-21: qty 10

## 2020-10-21 MED ORDER — ONDANSETRON HCL 4 MG/2ML IJ SOLN
8.0000 mg | Freq: Once | INTRAMUSCULAR | Status: AC
Start: 2020-10-21 — End: 2020-10-21
  Administered 2020-10-21: 8 mg via INTRAVENOUS

## 2020-10-21 MED ORDER — ADO-TRASTUZUMAB EMTANSINE CHEMO INJECTION 160 MG
3.4000 mg/kg | Freq: Once | INTRAVENOUS | Status: AC
  Administered 2020-10-21: 260 mg via INTRAVENOUS
  Filled 2020-10-21: qty 8

## 2020-10-21 MED ORDER — ACETAMINOPHEN 325 MG PO TABS
650.0000 mg | ORAL_TABLET | Freq: Once | ORAL | Status: AC
  Administered 2020-10-21: 650 mg via ORAL

## 2020-10-21 MED ORDER — ACETAMINOPHEN 325 MG PO TABS
ORAL_TABLET | ORAL | Status: AC
  Filled 2020-10-21: qty 2

## 2020-10-21 NOTE — Progress Notes (Signed)
[facility] CSW Progress Note  Holiday representative met with patient to provide ongoing supportive counseling.  Pt did well after last treatment and is hoping for the same today. She is anxious but looking forward to having scans done at the end of the month to determine effectiveness and surgery. She has vacation planned to [facility] starting June 1.    Overall, Jennine is doing well. She has been working on reframing and challenging automatic negative thoughts. She is having more moodiness with entering menopause and has been trying to pause before reacting and has set a "code word" with her mom to signal when she needs space.  CSW provided positive reinforcement for the steps pt has taken. Continued to process experience overall.   CSW will continue to see patient during treatment. Pt may call as needed between visits.   Christeen Douglas , LCSW

## 2020-10-21 NOTE — Patient Instructions (Signed)
[facility] MEDICAL ONCOLOGY  Discharge Instructions: °Thank you for choosing [facility] to provide your oncology and hematology care.  ° °If you have a lab appointment with the Cancer Center, please go directly to the Cancer Center and check in at the registration area. °  °Wear comfortable clothing and clothing appropriate for easy access to any Portacath or PICC line.  ° °We strive to give you quality time with your provider. You may need to reschedule your appointment if you arrive late (15 or more minutes).  Arriving late affects you and other patients whose appointments are after yours.  Also, if you miss three or more appointments without notifying the office, you may be dismissed from the clinic at the provider’s discretion.    °  °For prescription refill requests, have your pharmacy contact our office and allow 72 hours for refills to be completed.   ° °Today you received the following chemotherapy and/or immunotherapy agents Kadcyla    °  °To help prevent nausea and vomiting after your treatment, we encourage you to take your nausea medication as directed. ° °BELOW ARE SYMPTOMS THAT SHOULD BE REPORTED IMMEDIATELY: °*FEVER GREATER THAN 100.4 F (38 °C) OR HIGHER °*CHILLS OR SWEATING °*NAUSEA AND VOMITING THAT IS NOT CONTROLLED WITH YOUR NAUSEA MEDICATION °*UNUSUAL SHORTNESS OF BREATH °*UNUSUAL BRUISING OR BLEEDING °*URINARY PROBLEMS (pain or burning when urinating, or frequent urination) °*BOWEL PROBLEMS (unusual diarrhea, constipation, pain near the anus) °TENDERNESS IN MOUTH AND THROAT WITH OR WITHOUT PRESENCE OF ULCERS (sore throat, sores in mouth, or a toothache) °UNUSUAL RASH, SWELLING OR PAIN  °UNUSUAL VAGINAL DISCHARGE OR ITCHING  ° °Items with * indicate a potential emergency and should be followed up as soon as possible or go to the Emergency Department if any problems should occur. ° °Please show the CHEMOTHERAPY ALERT CARD or IMMUNOTHERAPY ALERT CARD at check-in to the  Emergency Department and triage nurse. ° °Should you have questions after your visit or need to cancel or reschedule your appointment, please contact [facility]  Dept: [phone]  and follow the prompts.  Office hours are 8:00 a.m. to 4:30 p.m. Monday - Friday. Please note that voicemails left after 4:00 p.m. may not be returned until the following business day.  We are closed weekends and major holidays. You have access to a nurse at all times for urgent questions. Please call the main number to the clinic Dept: [phone] and follow the prompts. ° ° °For any non-urgent questions, you may also contact your provider using MyChart. We now offer e-Visits for anyone 18 and older to request care online for non-urgent symptoms. For details visit [email or web address]. °  °Also download the MyChart app! Go to the app store, search "MyChart", open the app, select [facility], and log in with your MyChart username and password. ° °Due to Covid, a mask is required upon entering the hospital/clinic. If you do not have a mask, one will be given to you upon arrival. For doctor visits, patients may have 1 support person aged 18 or older with them. For treatment visits, patients cannot have anyone with them due to current Covid guidelines and our immunocompromised population.  ° °

## 2020-10-23 ENCOUNTER — Encounter: Payer: Self-pay | Admitting: Hematology and Oncology

## 2020-10-24 ENCOUNTER — Telehealth: Payer: Self-pay | Admitting: *Deleted

## 2020-10-24 NOTE — Telephone Encounter (Signed)
Spoke to pt, confirmed post neo appt with Dr. Donne Hazel to discuss sx on 5/31 at 3pm

## 2020-10-25 ENCOUNTER — Other Ambulatory Visit (HOSPITAL_BASED_OUTPATIENT_CLINIC_OR_DEPARTMENT_OTHER): Payer: Self-pay

## 2020-10-26 ENCOUNTER — Telehealth: Payer: Self-pay | Admitting: Hematology and Oncology

## 2020-10-26 NOTE — Telephone Encounter (Signed)
Schedule per 5/6 los. Pt will receive an updated appt calendar next visit

## 2020-11-02 ENCOUNTER — Other Ambulatory Visit: Payer: Self-pay

## 2020-11-02 ENCOUNTER — Inpatient Hospital Stay: Payer: BC Managed Care – PPO

## 2020-11-02 VITALS — BP 117/79 | HR 75 | Temp 98.0°F | Resp 18

## 2020-11-02 DIAGNOSIS — Z170 Estrogen receptor positive status [ER+]: Secondary | ICD-10-CM

## 2020-11-02 DIAGNOSIS — C50412 Malignant neoplasm of upper-outer quadrant of left female breast: Secondary | ICD-10-CM | POA: Diagnosis not present

## 2020-11-02 MED ORDER — GOSERELIN ACETATE 3.6 MG ~~LOC~~ IMPL
DRUG_IMPLANT | SUBCUTANEOUS | Status: AC
  Filled 2020-11-02: qty 3.6

## 2020-11-02 MED ORDER — GOSERELIN ACETATE 3.6 MG ~~LOC~~ IMPL
3.6000 mg | DRUG_IMPLANT | Freq: Once | SUBCUTANEOUS | Status: AC
  Administered 2020-11-02: 3.6 mg via SUBCUTANEOUS

## 2020-11-02 NOTE — Patient Instructions (Signed)

## 2020-11-07 ENCOUNTER — Telehealth: Payer: Self-pay | Admitting: Hematology and Oncology

## 2020-11-07 NOTE — Telephone Encounter (Signed)
R/s appt per 5/23 sch msg. Pt aware.  

## 2020-11-09 NOTE — Progress Notes (Signed)
Patient Care Team: [name], MD as PCP - General (Internal Medicine) Nicholas Lose, MD as Consulting Physician (Hematology and Oncology) Rolm Bookbinder, MD as Consulting Physician (General Surgery) Irene Limbo, MD as Consulting Physician (Plastic Surgery) Rockwell Germany, RN as Registered Nurse Mauro Kaufmann, RN as Registered Nurse Sylvan Cheese, NP as Nurse Practitioner (Hematology and Oncology) Paula Compton, MD as Consulting Physician (Obstetrics and Gynecology)  DIAGNOSIS:    ICD-10-CM   1. Malignant neoplasm of upper-outer quadrant of left breast in female, estrogen receptor positive ([place])  C50.412    Z17.0     SUMMARY OF ONCOLOGIC HISTORY: Oncology History  Breast cancer of upper-outer quadrant of left female breast ([place])  08/13/2014 Mammogram   Left breast: 2 cm circumscribed mass   08/13/2014 Breast US   Left breast: two masses: #1: 1:00 10 x 9 x 13 mmm irregular; #2: 2:00: 16 x 7 x 14 mm; no LAD   08/27/2014 Initial Diagnosis   O/S excisional biopsy: 2 masses showing 1.5 cm and 0.9 cm IDC, Grade 3, ER + (90%), PR- (0%), HER-2 positive (ratio 2.5), Ki67 ~30%, Multifocal   08/27/2014 Clinical Stage   Stage IIA/IIB: T2/T3 N0   09/17/2014 Procedure   MyRisk panel ([employer]) 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 Otherwise negative at APC, ATM, BARD1, BMPR1A, BRCA1, BRCA2, BRIP1, CHD1, CDK4, CDKN2A, CHEK2, MLH1, MSH2, MSH6, MUTYH, NBN, PALB2, PMS2, PTEN, RAD51C, RAD51D, SMAD4, STK11, and TP53   09/20/2014 Breast MRI   RIGHT: 1 x 0.8 x 0.9 cm lobulated border mass in the retroareolar lower slight lateral area. LEFT: hematoma with surrounding adjacent enhancement encompassing a 4.6 x 4.9 x 4.1 cm area.    09/27/2014 - 01/10/2015 Neo-Adjuvant Chemotherapy   Neoadjuvant TCH Perjeta every 3 week 6 followed by Herceptin maintenance completed 09/21/2015   01/13/2015 Breast MRI   Postsurgical changes in left breast without residual enhancing masses  compatible with treatment response   02/10/2015 Definitive Surgery   Bilateral mastectomies: RIGHT: benign.  LEFT: complete path response;  0/3 sentinel nodes   02/10/2015 Pathologic Stage   ypT0 ypN0 ypM0   02/22/2015 -  Anti-estrogen oral therapy   Zoladex with tamoxifen, stopped Zoladex for intolerance, decreased tamoxifen to 10 mg daily.   11/17/2015 Survivorship   Survivorship care plan completed and given to patient    Relapse/Recurrence   Within the upper-outer left breast anterior depth there are 2 adjacent irregular enhancing masses (2.1 cm and 1.5 cm) compatible with biopsy-proven malignancy. Indeterminate enhancing mass within the outer left breast posterior depth (1.8 cm). Indeterminate enhancing mass within the outer lower posterior right breast. 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 ER 80%, PR 0%, Her 2 Positive   08/12/2020 Cancer Staging   Staging form: Breast, AJCC 8th Edition - Pathologic stage from 08/12/2020: Stage IA (pT1b, pN0, cM0, G2, ER+, PR-, HER2+) - Signed by Gardenia Phlegm, NP on 08/24/2020 Stage prefix: Initial diagnosis Histologic grading system: 3 grade system   09/07/2020 -  Chemotherapy    Patient is on Treatment Plan: BREAST ADO-TRASTUZUMAB EMTANSINE ([facility]) Q21D        CHIEF COMPLIANT: Cycle4Kadcyla  INTERVAL HISTORY: Natasha Torres is a 40 y.o. with above-mentioned history of recurrentleft breast cancercurrently on chemotherapy with Kadcyla. She presentsto the clinic today for treatment.  After Kadcyla treatment she feels somewhat fatigued but recovers very well.  Mild nausea but it has not been bothering her.  She is able to work full-time and raise her child.  Denies any neuropathy symptoms.  She feels that the tumor in the breast has shrunk significantly.  She has an MRI appointment coming up.  ALLERGIES:  is allergic to buprenorphine hcl, morphine and related, other, penicillins, and  tegaderm ag mesh [silver].  MEDICATIONS:  Current Outpatient Medications  Medication Sig Dispense Refill  . Biotin 5000 MCG CAPS Take 5,000 mcg by mouth at bedtime.    . Cholecalciferol (VITAMIN D3) 50 MCG (2000 UT) TABS Take 2,000 Units by mouth at bedtime.    [name] ibuprofen (ADVIL) 200 MG tablet Take 600 mg by mouth every 8 (eight) hours as needed (pain). (Patient not taking: Reported on 09/05/2020)    . lidocaine-prilocaine (EMLA) cream Apply to affected area once 30 g 3  . loratadine (CLARITIN) 10 MG tablet Take 10 mg by mouth daily.    [name] LORazepam (ATIVAN) 0.5 MG tablet Take 1 tablet (0.5 mg total) by mouth every 6 (six) hours as needed for anxiety. (Patient taking differently: Take 0.5 mg by mouth See admin instructions. Take 1 tablet (0.5 mg) by mouth scheduled at bedtime; may take an additional dose during the day if needed for anxiety) 30 tablet 3  . Magnesium Hydroxide 400 MG CHEW Chew 400 mg by mouth at bedtime.    . Multiple Vitamins-Minerals (ADULT ONE DAILY GUMMIES PO) Take 2 tablets by mouth at bedtime.    . Omega-3 Fatty Acids (FISH OIL) 1200 MG CAPS Take 1,200 mg by mouth at bedtime.    . ondansetron (ZOFRAN) 8 MG tablet Take 1 tablet (8 mg total) by mouth every 8 (eight) hours as needed for nausea or vomiting. 30 tablet 1  . prochlorperazine (COMPAZINE) 10 MG tablet Take 1 tablet (10 mg total) by mouth every 6 (six) hours as needed for nausea or vomiting. 30 tablet 1  . Turmeric 500 MG CAPS Take 1 capsule by mouth daily. (Patient taking differently: Take 500 mg by mouth at bedtime.)     No current facility-administered medications for this visit.    PHYSICAL EXAMINATION: ECOG PERFORMANCE STATUS: 1 - Symptomatic but completely ambulatory  Vitals:   11/10/20 1400  BP: (!) 125/50  Pulse: 68  Resp: 18  Temp: 97.6 F (36.4 C)  SpO2: 100%   Filed Weights   11/10/20 1400  Weight: 164 lb (74.4 kg)    LABORATORY DATA:  I have reviewed the data as listed CMP Latest Ref Rng  & Units 10/21/2020 09/29/2020 09/07/2020  Glucose 70 - 99 mg/dL 85 89 110(H)  BUN 6 - 20 mg/dL 5(L) 6 6  Creatinine 0.44 - 1.00 mg/dL 0.69 0.69 0.79  Sodium 135 - 145 mmol/L 142 143 138  Potassium 3.5 - 5.1 mmol/L 3.7 3.6 3.7  Chloride 98 - 111 mmol/L 107 109 105  CO2 22 - 32 mmol/L _0 Calcium 8.9 - 10.3 mg/dL 9.3 8.9 8.6(L)  Total Protein 6.5 - 8.1 g/dL 6.9 6.5 6.2(L)  Total Bilirubin 0.3 - 1.2 mg/dL 0.3 0.3 0.3  Alkaline Phos 38 - 126 U/L 59 52 36(L)  AST 15 - 41 U/L 48(H) 35 27  ALT 0 - 44 U/L 40 25 16    Lab Results  Component Value Date   WBC 5.7 11/10/2020   HGB 11.2 (L) 11/10/2020   HCT 33.5 (L) 11/10/2020   MCV 88.2 11/10/2020   PLT 161 11/10/2020   NEUTROABS 2.6 11/10/2020    ASSESSMENT & PLAN:  Breast  cancer of upper-outer quadrant of left female breast 08/13/2014: Left breast IDC. 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 Indeterminate 1.8 cm and Right breast indeterminate 1.5 cm.Biopsy revealed IDC with DCIS, ER 80%, PR 0%, HER-2 3+ IHC positive  Bone scans 08/22/2020: Results awaited CT scans: scheduled for 08/24/20 Left breast Biopsy 3 o clock:Grade 2-3IMC with lobular featuresER 60%, PR 10%, Ki-67 20%, HER-2 3+ Rt Breast Biopsy: Benign  Treatment plan: 1.chemotherapy with Kadcylafor currently unresectable breast cancer4-6 cycles. 2. Surgery 3. Followed by Jeanie Cooks to discuss the pros and cons of radiation given ATM mutation) 4.Further adjuvant antiestrogen therapy (ovarian suppression or oophorectomy plus anastrozole) Adding Zoladex monthly to her treatment plan -------------------------------------------------------------------------------------------------------- Current treatment: Cycle4Kadcyla Echocardiogram 08/26/2020: EF 50 to 55%  Kadcyla  toxicities: 1.Infusion reaction:  Much better in spite of increasing the rate of infusion. 2.nausea: Done extremely well with Zofran  Breast MRI has been scheduled for 11/15/2020. Patient is going on vacation 11/16/2020. Her surgery had been scheduled for 12/01/2020 with Dr. Donne Hazel.   Return to clinic after surgery to discuss pathology report.  I will see her back on 12/08/2020   No orders of the defined types were placed in this encounter.  The patient has a good understanding of the overall plan. she agrees with it. she will call with any problems that may develop before the next visit here.  Total time spent: 30 mins including face to face time and time spent for planning, charting and coordination of care  Rulon Eisenmenger, MD, MPH 11/10/2020  I, Cloyde Reams Dorshimer, am acting as scribe for Dr. Nicholas Lose.  I have reviewed the above documentation for accuracy and completeness, and I agree with the above.

## 2020-11-10 ENCOUNTER — Inpatient Hospital Stay (HOSPITAL_BASED_OUTPATIENT_CLINIC_OR_DEPARTMENT_OTHER): Payer: BC Managed Care – PPO | Admitting: Hematology and Oncology

## 2020-11-10 ENCOUNTER — Inpatient Hospital Stay: Payer: BC Managed Care – PPO

## 2020-11-10 ENCOUNTER — Other Ambulatory Visit: Payer: Self-pay

## 2020-11-10 DIAGNOSIS — Z170 Estrogen receptor positive status [ER+]: Secondary | ICD-10-CM

## 2020-11-10 DIAGNOSIS — Z95828 Presence of other vascular implants and grafts: Secondary | ICD-10-CM

## 2020-11-10 DIAGNOSIS — C50412 Malignant neoplasm of upper-outer quadrant of left female breast: Secondary | ICD-10-CM | POA: Diagnosis not present

## 2020-11-10 LAB — CBC WITH DIFFERENTIAL (CANCER CENTER ONLY)
Abs Immature Granulocytes: 0.01 10*3/uL (ref 0.00–0.07)
Basophils Absolute: 0.1 10*3/uL (ref 0.0–0.1)
Basophils Relative: 1 %
Eosinophils Absolute: 0.1 10*3/uL (ref 0.0–0.5)
Eosinophils Relative: 2 %
HCT: 33.5 % — ABNORMAL LOW (ref 36.0–46.0)
Hemoglobin: 11.2 g/dL — ABNORMAL LOW (ref 12.0–15.0)
Immature Granulocytes: 0 %
Lymphocytes Relative: 40 %
Lymphs Abs: 2.3 10*3/uL (ref 0.7–4.0)
MCH: 29.5 pg (ref 26.0–34.0)
MCHC: 33.4 g/dL (ref 30.0–36.0)
MCV: 88.2 fL (ref 80.0–100.0)
Monocytes Absolute: 0.6 10*3/uL (ref 0.1–1.0)
Monocytes Relative: 11 %
Neutro Abs: 2.6 10*3/uL (ref 1.7–7.7)
Neutrophils Relative %: 46 %
Platelet Count: 161 10*3/uL (ref 150–400)
RBC: 3.8 MIL/uL — ABNORMAL LOW (ref 3.87–5.11)
RDW: 12.3 % (ref 11.5–15.5)
WBC Count: 5.7 10*3/uL (ref 4.0–10.5)
nRBC: 0 % (ref 0.0–0.2)

## 2020-11-10 LAB — CMP (CANCER CENTER ONLY)
ALT: 63 U/L — ABNORMAL HIGH (ref 0–44)
AST: 64 U/L — ABNORMAL HIGH (ref 15–41)
Albumin: 4 g/dL (ref 3.5–5.0)
Alkaline Phosphatase: 65 U/L (ref 38–126)
Anion gap: 8 (ref 5–15)
BUN: 7 mg/dL (ref 6–20)
CO2: 28 mmol/L (ref 22–32)
Calcium: 9.5 mg/dL (ref 8.9–10.3)
Chloride: 105 mmol/L (ref 98–111)
Creatinine: 0.74 mg/dL (ref 0.44–1.00)
GFR, Estimated: >60 mL/min (ref >60)
Glucose, Bld: 107 mg/dL — ABNORMAL HIGH (ref 70–99)
Potassium: 3.5 mmol/L (ref 3.5–5.1)
Sodium: 141 mmol/L (ref 135–145)
Total Bilirubin: 0.4 mg/dL (ref 0.3–1.2)
Total Protein: 6.9 g/dL (ref 6.5–8.1)

## 2020-11-10 MED ORDER — SODIUM CHLORIDE 0.9 % IV SOLN: 8.0000 mg | Freq: Once | INTRAVENOUS | Status: DC

## 2020-11-10 MED ORDER — ONDANSETRON HCL 4 MG/2ML IJ SOLN
8.0000 mg | Freq: Once | INTRAMUSCULAR | Status: AC
  Administered 2020-11-10: 8 mg via INTRAVENOUS

## 2020-11-10 MED ORDER — FAMOTIDINE 20 MG IN NS 100 ML IVPB
20.0000 mg | Freq: Once | INTRAVENOUS | Status: AC
  Administered 2020-11-10: 20 mg via INTRAVENOUS

## 2020-11-10 MED ORDER — SODIUM CHLORIDE 0.9% FLUSH
10.0000 mL | INTRAVENOUS | Status: DC | PRN
  Administered 2020-11-10: 10 mL via INTRAVENOUS
  Filled 2020-11-10: qty 10

## 2020-11-10 MED ORDER — ACETAMINOPHEN 325 MG PO TABS
650.0000 mg | ORAL_TABLET | Freq: Once | ORAL | Status: AC
  Administered 2020-11-10: 650 mg via ORAL

## 2020-11-10 MED ORDER — SODIUM CHLORIDE 0.9 % IV SOLN
3.5000 mg/kg | Freq: Once | INTRAVENOUS | Status: AC
  Administered 2020-11-10: 260 mg via INTRAVENOUS
  Filled 2020-11-10: qty 5

## 2020-11-10 MED ORDER — DIPHENHYDRAMINE HCL 25 MG PO CAPS
50.0000 mg | ORAL_CAPSULE | Freq: Once | ORAL | Status: AC
  Administered 2020-11-10: 50 mg via ORAL

## 2020-11-10 MED ORDER — SODIUM CHLORIDE 0.9 % IV SOLN
Freq: Once | INTRAVENOUS | Status: AC
Start: 2020-11-10 — End: 2020-11-10
  Filled 2020-11-10: qty 250

## 2020-11-10 NOTE — Progress Notes (Signed)
Pt did not waited post kadcyla infusion wait time. VSS stable.

## 2020-11-10 NOTE — Patient Instructions (Signed)
[facility] Discharge Instructions for Patients Receiving Chemotherapy  Today you received the following chemotherapy agents kadcyla  To help prevent nausea and vomiting after your treatment, we encourage you to take your nausea medication as directed   If you develop nausea and vomiting that is not controlled by your nausea medication, call the clinic.   BELOW ARE SYMPTOMS THAT SHOULD BE REPORTED IMMEDIATELY:  *FEVER GREATER THAN 100.5 F  *CHILLS WITH OR WITHOUT FEVER  NAUSEA AND VOMITING THAT IS NOT CONTROLLED WITH YOUR NAUSEA MEDICATION  *UNUSUAL SHORTNESS OF BREATH  *UNUSUAL BRUISING OR BLEEDING  TENDERNESS IN MOUTH AND THROAT WITH OR WITHOUT PRESENCE OF ULCERS  *URINARY PROBLEMS  *BOWEL PROBLEMS  UNUSUAL RASH Items with * indicate a potential emergency and should be followed up as soon as possible.  Feel free to call the clinic should you have any questions or concerns. The clinic phone number is [phone].  Please show the CHEMO ALERT CARD at check-in to the Emergency Department and triage nurse.   

## 2020-11-10 NOTE — Assessment & Plan Note (Signed)
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 Indeterminate 1.8 cm and Right breast indeterminate 1.5 cm.Biopsy revealed IDC with DCIS, ER 80%, PR 0%, HER-2 3+ IHC positive  Bone scans 08/22/2020: Results awaited CT scans: scheduled for 08/24/20 Left breast Biopsy 3 o clock:Grade 2-3IMC with lobular featuresER 60%, PR 10%, Ki-67 20%, HER-2 3+ Rt Breast Biopsy: Benign  Treatment plan: 1.chemotherapy with Kadcylafor currently unresectable breast cancer4-6 cycles. 2. Surgery 3. Followed by Jeanie Cooks to discuss the pros and cons of radiation given ATM mutation) 4.Further adjuvant antiestrogen therapy (ovarian suppression or oophorectomy plus anastrozole) Adding Zoladex monthly to her treatment plan -------------------------------------------------------------------------------------------------------- Current treatment: Cycle4Kadcyla Echocardiogram 08/26/2020: EF 50 to 55%  Kadcyla toxicities: 1.Infusion reaction:  Much better in spite of increasing the rate of infusion. 2.nausea: Done extremely well with Zofran    Breast MRI has been scheduled for 11/15/2020. Patient is going on vacation 11/16/2020. She would like to schedule her surgery 11/25/2020 with Dr. Donne Hazel.  I sent a message. Return to clinic after surgery to discuss pathology report.

## 2020-11-11 ENCOUNTER — Encounter: Payer: Self-pay | Admitting: Hematology and Oncology

## 2020-11-11 ENCOUNTER — Ambulatory Visit: Payer: BC Managed Care – PPO | Admitting: Hematology and Oncology

## 2020-11-11 ENCOUNTER — Other Ambulatory Visit: Payer: BC Managed Care – PPO

## 2020-11-11 ENCOUNTER — Ambulatory Visit: Payer: BC Managed Care – PPO

## 2020-11-11 ENCOUNTER — Encounter: Payer: Self-pay | Admitting: *Deleted

## 2020-11-15 ENCOUNTER — Inpatient Hospital Stay: Payer: BC Managed Care – PPO | Admitting: Hematology and Oncology

## 2020-11-15 ENCOUNTER — Other Ambulatory Visit: Payer: Self-pay

## 2020-11-15 ENCOUNTER — Encounter: Payer: Self-pay | Admitting: *Deleted

## 2020-11-15 ENCOUNTER — Ambulatory Visit
Admission: RE | Admit: 2020-11-15 | Discharge: 2020-11-15 | Disposition: A | Discharge status: Home / Self Care | Payer: BC Managed Care – PPO | Source: Ambulatory Visit | Attending: Hematology and Oncology | Admitting: Hematology and Oncology

## 2020-11-15 ENCOUNTER — Other Ambulatory Visit: Payer: Self-pay | Admitting: General Surgery

## 2020-11-15 DIAGNOSIS — Z170 Estrogen receptor positive status [ER+]: Secondary | ICD-10-CM

## 2020-11-15 DIAGNOSIS — C50412 Malignant neoplasm of upper-outer quadrant of left female breast: Secondary | ICD-10-CM

## 2020-11-15 MED ORDER — GADOBUTROL 1 MMOL/ML IV SOLN
7.0000 mL | Freq: Once | INTRAVENOUS | Status: AC | PRN
  Administered 2020-11-15: 7 mL via INTRAVENOUS

## 2020-11-16 ENCOUNTER — Other Ambulatory Visit: Payer: Self-pay | Admitting: General Surgery

## 2020-11-16 DIAGNOSIS — C50412 Malignant neoplasm of upper-outer quadrant of left female breast: Secondary | ICD-10-CM

## 2020-11-16 DIAGNOSIS — Z170 Estrogen receptor positive status [ER+]: Secondary | ICD-10-CM

## 2020-11-21 ENCOUNTER — Other Ambulatory Visit: Payer: Self-pay

## 2020-11-21 ENCOUNTER — Encounter (HOSPITAL_BASED_OUTPATIENT_CLINIC_OR_DEPARTMENT_OTHER): Payer: Self-pay | Admitting: General Surgery

## 2020-11-25 ENCOUNTER — Ambulatory Visit (HOSPITAL_COMMUNITY)
Admission: RE | Admit: 2020-11-25 | Discharge: 2020-11-25 | Disposition: A | Discharge status: Home / Self Care | Payer: BC Managed Care – PPO | Source: Ambulatory Visit | Attending: Hematology and Oncology | Admitting: Hematology and Oncology

## 2020-11-25 ENCOUNTER — Other Ambulatory Visit: Payer: Self-pay

## 2020-11-25 ENCOUNTER — Other Ambulatory Visit (HOSPITAL_COMMUNITY): Payer: BC Managed Care – PPO

## 2020-11-25 DIAGNOSIS — Z170 Estrogen receptor positive status [ER+]: Secondary | ICD-10-CM | POA: Diagnosis not present

## 2020-11-25 DIAGNOSIS — C50412 Malignant neoplasm of upper-outer quadrant of left female breast: Secondary | ICD-10-CM | POA: Diagnosis not present

## 2020-11-25 DIAGNOSIS — Z79899 Other long term (current) drug therapy: Secondary | ICD-10-CM | POA: Diagnosis not present

## 2020-11-25 DIAGNOSIS — Z01818 Encounter for other preprocedural examination: Secondary | ICD-10-CM | POA: Diagnosis not present

## 2020-11-25 DIAGNOSIS — Z0189 Encounter for other specified special examinations: Secondary | ICD-10-CM | POA: Diagnosis not present

## 2020-11-25 LAB — ECHOCARDIOGRAM COMPLETE
Area-P 1/2: 3.91 cm2
S' Lateral: 3.5 cm

## 2020-11-25 NOTE — Progress Notes (Signed)
  Echocardiogram 2D Echocardiogram has been performed.  Wasif Simonich G Christyanna Mckeon 11/25/2020, 11:11 AM

## 2020-11-28 ENCOUNTER — Other Ambulatory Visit: Payer: Self-pay | Admitting: General Surgery

## 2020-11-28 ENCOUNTER — Ambulatory Visit: Payer: BC Managed Care – PPO

## 2020-11-28 ENCOUNTER — Ambulatory Visit
Admission: RE | Admit: 2020-11-28 | Discharge: 2020-11-28 | Disposition: A | Discharge status: Home / Self Care | Payer: BC Managed Care – PPO | Source: Ambulatory Visit | Attending: General Surgery | Admitting: General Surgery

## 2020-11-28 ENCOUNTER — Other Ambulatory Visit: Payer: Self-pay

## 2020-11-28 DIAGNOSIS — C50412 Malignant neoplasm of upper-outer quadrant of left female breast: Secondary | ICD-10-CM

## 2020-11-28 DIAGNOSIS — Z170 Estrogen receptor positive status [ER+]: Secondary | ICD-10-CM

## 2020-11-28 NOTE — Progress Notes (Signed)
CHG soap and Ensure Presurgery given to patient with instructions. Confirmed arrival time DOS. Answered all questions.

## 2020-11-29 ENCOUNTER — Ambulatory Visit (HOSPITAL_BASED_OUTPATIENT_CLINIC_OR_DEPARTMENT_OTHER): Payer: BC Managed Care – PPO | Admitting: Certified Registered"

## 2020-11-29 ENCOUNTER — Ambulatory Visit
Admission: RE | Admit: 2020-11-29 | Discharge: 2020-11-29 | Disposition: A | Discharge status: Home / Self Care | Payer: BC Managed Care – PPO | Source: Ambulatory Visit | Attending: General Surgery | Admitting: General Surgery

## 2020-11-29 ENCOUNTER — Encounter (HOSPITAL_BASED_OUTPATIENT_CLINIC_OR_DEPARTMENT_OTHER): Payer: Self-pay | Admitting: General Surgery

## 2020-11-29 ENCOUNTER — Other Ambulatory Visit: Payer: Self-pay

## 2020-11-29 ENCOUNTER — Encounter (HOSPITAL_BASED_OUTPATIENT_CLINIC_OR_DEPARTMENT_OTHER)
Admission: RE | Disposition: A | Discharge status: Home / Self Care | Payer: Self-pay | Source: Home / Self Care | Attending: General Surgery

## 2020-11-29 ENCOUNTER — Ambulatory Visit (HOSPITAL_BASED_OUTPATIENT_CLINIC_OR_DEPARTMENT_OTHER)
Admission: RE | Admit: 2020-11-29 | Discharge: 2020-11-29 | Disposition: A | Discharge status: Home / Self Care | Payer: BC Managed Care – PPO | Attending: General Surgery | Admitting: General Surgery

## 2020-11-29 DIAGNOSIS — Z8042 Family history of malignant neoplasm of prostate: Secondary | ICD-10-CM | POA: Insufficient documentation

## 2020-11-29 DIAGNOSIS — Z853 Personal history of malignant neoplasm of breast: Secondary | ICD-10-CM | POA: Diagnosis not present

## 2020-11-29 DIAGNOSIS — Z1501 Genetic susceptibility to malignant neoplasm of breast: Secondary | ICD-10-CM | POA: Insufficient documentation

## 2020-11-29 DIAGNOSIS — Z801 Family history of malignant neoplasm of trachea, bronchus and lung: Secondary | ICD-10-CM | POA: Insufficient documentation

## 2020-11-29 DIAGNOSIS — Z9013 Acquired absence of bilateral breasts and nipples: Secondary | ICD-10-CM | POA: Diagnosis not present

## 2020-11-29 DIAGNOSIS — C50412 Malignant neoplasm of upper-outer quadrant of left female breast: Secondary | ICD-10-CM

## 2020-11-29 DIAGNOSIS — Z170 Estrogen receptor positive status [ER+]: Secondary | ICD-10-CM

## 2020-11-29 DIAGNOSIS — Z79899 Other long term (current) drug therapy: Secondary | ICD-10-CM | POA: Insufficient documentation

## 2020-11-29 DIAGNOSIS — Z885 Allergy status to narcotic agent status: Secondary | ICD-10-CM | POA: Insufficient documentation

## 2020-11-29 DIAGNOSIS — Z880 Allergy status to penicillin: Secondary | ICD-10-CM | POA: Insufficient documentation

## 2020-11-29 DIAGNOSIS — C44501 Unspecified malignant neoplasm of skin of breast: Secondary | ICD-10-CM | POA: Diagnosis not present

## 2020-11-29 DIAGNOSIS — Z803 Family history of malignant neoplasm of breast: Secondary | ICD-10-CM | POA: Diagnosis not present

## 2020-11-29 DIAGNOSIS — Z8051 Family history of malignant neoplasm of kidney: Secondary | ICD-10-CM | POA: Insufficient documentation

## 2020-11-29 DIAGNOSIS — Z9221 Personal history of antineoplastic chemotherapy: Secondary | ICD-10-CM | POA: Diagnosis not present

## 2020-11-29 DIAGNOSIS — Z91048 Other nonmedicinal substance allergy status: Secondary | ICD-10-CM | POA: Insufficient documentation

## 2020-11-29 HISTORY — PX: RADIOACTIVE SEED GUIDED EXCISIONAL BREAST BIOPSY: SHX6490

## 2020-11-29 SURGERY — RADIOACTIVE SEED GUIDED BREAST BIOPSY
Anesthesia: General | Site: Breast | Laterality: Left

## 2020-11-29 MED ORDER — PROPOFOL 10 MG/ML IV BOLUS
INTRAVENOUS | Status: DC | PRN
  Administered 2020-11-29: 200 mg via INTRAVENOUS

## 2020-11-29 MED ORDER — LIDOCAINE HCL (PF) 2 % IJ SOLN
INTRAMUSCULAR | Status: AC
  Filled 2020-11-29: qty 5

## 2020-11-29 MED ORDER — FENTANYL CITRATE (PF) 100 MCG/2ML IJ SOLN
INTRAMUSCULAR | Status: AC
  Filled 2020-11-29: qty 2

## 2020-11-29 MED ORDER — DEXAMETHASONE SODIUM PHOSPHATE 10 MG/ML IJ SOLN
INTRAMUSCULAR | Status: AC
  Filled 2020-11-29: qty 1

## 2020-11-29 MED ORDER — EPHEDRINE 5 MG/ML INJ
INTRAVENOUS | Status: AC
  Filled 2020-11-29: qty 10

## 2020-11-29 MED ORDER — PROPOFOL 10 MG/ML IV BOLUS
INTRAVENOUS | Status: AC
  Filled 2020-11-29: qty 20

## 2020-11-29 MED ORDER — CEFAZOLIN SODIUM-DEXTROSE 2-4 GM/100ML-% IV SOLN
INTRAVENOUS | Status: AC
  Filled 2020-11-29: qty 100

## 2020-11-29 MED ORDER — FENTANYL CITRATE (PF) 100 MCG/2ML IJ SOLN
INTRAMUSCULAR | Status: DC | PRN
  Administered 2020-11-29: 25 ug via INTRAVENOUS

## 2020-11-29 MED ORDER — TRAMADOL HCL 50 MG PO TABS: 100.0000 mg | ORAL_TABLET | Freq: Four times a day (QID) | ORAL | 0 refills | Status: DC | PRN

## 2020-11-29 MED ORDER — MEPERIDINE HCL 25 MG/ML IJ SOLN: 6.2500 mg | INTRAMUSCULAR | Status: DC | PRN

## 2020-11-29 MED ORDER — EPHEDRINE SULFATE 50 MG/ML IJ SOLN
INTRAMUSCULAR | Status: DC | PRN
  Administered 2020-11-29: 10 mg via INTRAVENOUS

## 2020-11-29 MED ORDER — TURMERIC 500 MG PO CAPS: 500.0000 mg | ORAL_CAPSULE | Freq: Every day | ORAL | Status: DC

## 2020-11-29 MED ORDER — HYDROMORPHONE HCL 1 MG/ML IJ SOLN
0.2500 mg | INTRAMUSCULAR | Status: DC | PRN
  Administered 2020-11-29: 0.5 mg via INTRAVENOUS

## 2020-11-29 MED ORDER — CEFAZOLIN SODIUM-DEXTROSE 2-4 GM/100ML-% IV SOLN
2.0000 g | INTRAVENOUS | Status: AC
  Administered 2020-11-29: 2 g via INTRAVENOUS

## 2020-11-29 MED ORDER — AMISULPRIDE (ANTIEMETIC) 5 MG/2ML IV SOLN: 10.0000 mg | Freq: Once | INTRAVENOUS | Status: DC | PRN

## 2020-11-29 MED ORDER — PROPOFOL 10 MG/ML IV BOLUS
INTRAVENOUS | Status: AC
  Filled 2020-11-29: qty 40

## 2020-11-29 MED ORDER — DEXAMETHASONE SODIUM PHOSPHATE 10 MG/ML IJ SOLN
INTRAMUSCULAR | Status: DC | PRN
  Administered 2020-11-29: 5 mg via INTRAVENOUS

## 2020-11-29 MED ORDER — LACTATED RINGERS IV SOLN: INTRAVENOUS | Status: DC

## 2020-11-29 MED ORDER — ONDANSETRON HCL 4 MG/2ML IJ SOLN
INTRAMUSCULAR | Status: DC | PRN
  Administered 2020-11-29: 4 mg via INTRAVENOUS

## 2020-11-29 MED ORDER — ONDANSETRON HCL 4 MG/2ML IJ SOLN
INTRAMUSCULAR | Status: AC
  Filled 2020-11-29: qty 2

## 2020-11-29 MED ORDER — MIDAZOLAM HCL 2 MG/2ML IJ SOLN
INTRAMUSCULAR | Status: AC
  Filled 2020-11-29: qty 2

## 2020-11-29 MED ORDER — BUPIVACAINE HCL (PF) 0.25 % IJ SOLN
INTRAMUSCULAR | Status: DC | PRN
  Administered 2020-11-29: 4.5 mL

## 2020-11-29 MED ORDER — PROMETHAZINE HCL 25 MG/ML IJ SOLN: 6.2500 mg | INTRAMUSCULAR | Status: DC | PRN

## 2020-11-29 MED ORDER — FENTANYL CITRATE (PF) 100 MCG/2ML IJ SOLN
100.0000 ug | Freq: Once | INTRAMUSCULAR | Status: AC
Start: 2020-11-29 — End: 2020-11-29
  Administered 2020-11-29: 100 ug via INTRAVENOUS

## 2020-11-29 MED ORDER — ROPIVACAINE HCL 5 MG/ML IJ SOLN
INTRAMUSCULAR | Status: DC | PRN
  Administered 2020-11-29: 30 mL via PERINEURAL

## 2020-11-29 MED ORDER — HYDROMORPHONE HCL 1 MG/ML IJ SOLN
INTRAMUSCULAR | Status: AC
  Filled 2020-11-29: qty 0.5

## 2020-11-29 MED ORDER — OXYCODONE HCL 5 MG PO TABS: 5.0000 mg | ORAL_TABLET | Freq: Once | ORAL | Status: DC | PRN

## 2020-11-29 MED ORDER — MIDAZOLAM HCL 2 MG/2ML IJ SOLN
2.0000 mg | Freq: Once | INTRAMUSCULAR | Status: AC
  Administered 2020-11-29: 2 mg via INTRAVENOUS

## 2020-11-29 MED ORDER — OXYCODONE HCL 5 MG/5ML PO SOLN: 5.0000 mg | Freq: Once | ORAL | Status: DC | PRN

## 2020-11-29 MED ORDER — LIDOCAINE HCL (CARDIAC) PF 100 MG/5ML IV SOSY
PREFILLED_SYRINGE | INTRAVENOUS | Status: DC | PRN
  Administered 2020-11-29: 60 mg via INTRAVENOUS

## 2020-11-29 SURGICAL SUPPLY — 44 items
ADH SKN CLS APL DERMABOND .7 (GAUZE/BANDAGES/DRESSINGS) ×1
APL PRP STRL LF DISP 70% ISPRP (MISCELLANEOUS) ×1
BINDER BREAST LRG (GAUZE/BANDAGES/DRESSINGS) ×1 IMPLANT
BINDER BREAST XLRG (GAUZE/BANDAGES/DRESSINGS) IMPLANT
BLADE SURG 15 STRL LF DISP TIS (BLADE) ×1 IMPLANT
BLADE SURG 15 STRL SS (BLADE) ×2
CHLORAPREP W/TINT 26 (MISCELLANEOUS) ×2 IMPLANT
CLIP VESOCCLUDE SM WIDE 6/CT (CLIP) IMPLANT
COVER BACK TABLE 60X90IN (DRAPES) ×2 IMPLANT
COVER MAYO STAND STRL (DRAPES) ×2 IMPLANT
COVER PROBE W GEL 5X96 (DRAPES) ×2 IMPLANT
DERMABOND ADVANCED (GAUZE/BANDAGES/DRESSINGS) ×1
DERMABOND ADVANCED .7 DNX12 (GAUZE/BANDAGES/DRESSINGS) ×1 IMPLANT
DRAPE LAPAROSCOPIC ABDOMINAL (DRAPES) ×2 IMPLANT
DRAPE UTILITY XL STRL (DRAPES) ×2 IMPLANT
ELECT COATED BLADE 2.86 ST (ELECTRODE) ×2 IMPLANT
ELECT REM PT RETURN 9FT ADLT (ELECTROSURGICAL) ×2
ELECTRODE REM PT RTRN 9FT ADLT (ELECTROSURGICAL) ×1 IMPLANT
GLOVE SURG ENC MOIS LTX SZ7 (GLOVE) ×4 IMPLANT
GLOVE SURG UNDER POLY LF SZ7.5 (GLOVE) ×2 IMPLANT
GOWN STRL REUS W/ TWL LRG LVL3 (GOWN DISPOSABLE) ×2 IMPLANT
GOWN STRL REUS W/TWL LRG LVL3 (GOWN DISPOSABLE) ×4
HEMOSTAT ARISTA ABSORB 3G PWDR (HEMOSTASIS) IMPLANT
KIT MARKER MARGIN INK (KITS) ×2 IMPLANT
NDL HYPO 25X1 1.5 SAFETY (NEEDLE) ×1 IMPLANT
NEEDLE HYPO 25X1 1.5 SAFETY (NEEDLE) ×2 IMPLANT
NS IRRIG 1000ML POUR BTL (IV SOLUTION) IMPLANT
PACK BASIN DAY SURGERY FS (CUSTOM PROCEDURE TRAY) ×2 IMPLANT
PENCIL SMOKE EVACUATOR (MISCELLANEOUS) ×2 IMPLANT
SLEEVE SCD COMPRESS KNEE MED (STOCKING) ×2 IMPLANT
SPONGE LAP 4X18 RFD (DISPOSABLE) ×2 IMPLANT
STRIP CLOSURE SKIN 1/2X4 (GAUZE/BANDAGES/DRESSINGS) ×2 IMPLANT
SUT MNCRL AB 4-0 PS2 18 (SUTURE) ×1 IMPLANT
SUT MON AB 5-0 PS2 18 (SUTURE) IMPLANT
SUT SILK 2 0 SH (SUTURE) ×1 IMPLANT
SUT VIC AB 2-0 SH 27 (SUTURE) ×2
SUT VIC AB 2-0 SH 27XBRD (SUTURE) ×1 IMPLANT
SUT VIC AB 3-0 SH 27 (SUTURE) ×2
SUT VIC AB 3-0 SH 27X BRD (SUTURE) ×1 IMPLANT
SYR CONTROL 10ML LL (SYRINGE) ×2 IMPLANT
TOWEL GREEN STERILE FF (TOWEL DISPOSABLE) ×2 IMPLANT
TRAY FAXITRON CT DISP (TRAY / TRAY PROCEDURE) ×2 IMPLANT
TUBE CONNECTING 20X1/4 (TUBING) IMPLANT
YANKAUER SUCT BULB TIP NO VENT (SUCTIONS) IMPLANT

## 2020-11-29 NOTE — Interval H&P Note (Signed)
History and Physical Interval Note:  11/29/2020 8:28 AM  Natasha Torres  has presented today for surgery, with the diagnosis of RECURRENT LEFT BREAST CANCER.  The various methods of treatment have been discussed with the patient and family. After consideration of risks, benefits and other options for treatment, the patient has consented to  Procedure(s): RADIOACTIVE SEED GUIDED WIDE LOCAL EXCISION OF LEFT BREAST CANCER (Left) as a surgical intervention.  The patient's history has been reviewed, patient examined, no change in status, stable for surgery.  I have reviewed the patient's chart and labs.  Questions were answered to the patient's satisfaction.     Rolm Bookbinder

## 2020-11-29 NOTE — Progress Notes (Signed)
Assisted Dr. Miller with left, ultrasound guided, pectoralis block. Side rails up, monitors on throughout procedure. See vital signs in flow sheet. Tolerated Procedure well. 

## 2020-11-29 NOTE — H&P (Signed)
Natasha Torres is an 40 y.o. female.   Chief Complaint: recurrent breast cancer HPI: 68 yof who had history that starts six years ago.   she has left breast mass.  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  This was er pos at 90%, pr negative, her2 amplified and Ki is 30%.  She has undergone mri here that shows breast density c.  In the right breast there is lobulated mass that has now been biopsied and is a FA.  In the left breast there is another biopsy of subareolar ducts that is dilated ducts and this is recommended for consideration of excision.  the right side is negative as is the node biopsy.  She had a port placed in [place].  She was then begun on primary chemotherapy for which she has had tch/perjeta. 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 she then underwent bilateral nsm with left ax sn biopsy. she had path CR on left with neg sentinel nodes.  She was on zoladex which was stopped.  she then has remained on 10 mg tamoxifen which is what she is on now.  she noted a left breast mass recently lateral to nac.  this shows multiple hypoechoic masses in left breast with two measuring 6 mm and 4 mm in largest dimension.  she has undergone mri that shows a 1.5x1.1 cm area of enhancement in the lower outer posterior right breast and in the left breasts notes a 2.1 x 1.2 cm mass that is biopsied cancer (clips 7 mm apart) and there is additional area measuring 1.8 cm far posterior at 3 oclock. nodes are all normal. biopsy of the breast masses shows grade III idc with dcis that is er pos at 101, pr neg, her 2 pos, Ki 50%.  she has negative bone scan. she has undergone primary systemic therapy and has mri at completion today that shows no abnormal nodes and no residual enhancement at anterior site and only minimal at posterior. she does not note anything concerning and has done well  Past Medical History:  Diagnosis Date   Anemia    takes iron  supplement   Anxiety    Family history of adverse reaction to anesthesia    pt's mother has hx. of post-op N/V   History of breast cancer 2016   left breast ca 08/2014   Recurrent breast cancer, left ([place]) dx'd 07/2020   Tattoo of skin 06/07/2016   new tattoo right ankle    Past Surgical History:  Procedure Laterality Date   BREAST LUMPECTOMY Left 08/2014   BREAST RECONSTRUCTION WITH PLACEMENT OF TISSUE EXPANDER AND FLEX HD (ACELLULAR HYDRATED DERMIS) Bilateral 02/10/2015   Procedure: BREAST RECONSTRUCTION WITH PLACEMENT OF TISSUE EXPANDER AND FLEX HD (ACELLULAR HYDRATED DERMIS);  Surgeon: Irene Limbo, MD;  Location: [facility];  Service: Plastics;  Laterality: Bilateral;   INCISION AND DRAINAGE OF WOUND Left 03/02/2015   Procedure: IRRIGATION BREAST POCKET AND EXCHANGE OF LEFT BREAST TISSUE EXPANDER;  Surgeon: Irene Limbo, MD;  Location: [facility];  Service: Plastics;  Laterality: Left;   LIPOSUCTION WITH LIPOFILLING Bilateral 06/07/2015   Procedure: LIPOSUCTION WITH LIPOFILLING TO BILATERAL CHEST;  Surgeon: Irene Limbo, MD;  Location: [facility];  Service: Plastics;  Laterality: Bilateral;   LIPOSUCTION WITH LIPOFILLING Bilateral 09/23/2015   Procedure: LIPOFILLING FROM BILATERAL THIGHS TO BILATERAL CHEST ;  Surgeon: Irene Limbo, MD;  Location: [facility];  Service: Plastics;  Laterality: Bilateral;   LIPOSUCTION WITH LIPOFILLING Bilateral 06/15/2016   Procedure: LIPOFILLING FROM BILATERAL THIGH TO BILATERAL CHEST;  Surgeon: Irene Limbo, MD;  Location: [facility];  Service: Plastics;  Laterality: Bilateral;  LIPOFILLING FROM BILATERAL THIGH TO BILATERAL CHEST   MASTECTOMY Bilateral 02/10/2015   NIPPLE SPARING MASTECTOMY/SENTINAL LYMPH NODE BIOPSY/RECONSTRUCTION/PLACEMENT OF TISSUE EXPANDER Bilateral 02/10/2015   Procedure: BILATERAL  NIPPLE SPARING MASTECTOMY WITH LEFT  SENTINAL LYMPH NODE BIOPSY(RIGHT BREAST PROPHYLACTIC);  Surgeon:  Rolm Bookbinder, MD;  Location: [facility];  Service: General;  Laterality: Bilateral;   ORIF FINGER / THUMB FRACTURE Right 1994   thumb   PORT-A-CATH REMOVAL  01/07/2015   removed and replaced   PORT-A-CATH REMOVAL Right 09/23/2015   Procedure: REMOVAL PORT-A-CATH;  Surgeon: Irene Limbo, MD;  Location: [facility];  Service: Plastics;  Laterality: Right;   PORTACATH PLACEMENT  08/2014; 01/07/2015   PORTACATH PLACEMENT Right 09/06/2020   Procedure: INSERTION PORT-A-CATH;  Surgeon: Rolm Bookbinder, MD;  Location: [facility];  Service: General;  Laterality: Right;   REMOVAL OF BILATERAL TISSUE EXPANDERS WITH PLACEMENT OF BILATERAL BREAST IMPLANTS Bilateral 06/07/2015   Procedure: REMOVAL OF BILATERAL TISSUE EXPANDERS WITH PLACEMENT OF BILATERAL BREAST  SILICONE IMPLANTS;  Surgeon: Irene Limbo, MD;  Location: [facility];  Service: Plastics;  Laterality: Bilateral;   REMOVAL OF TISSUE EXPANDER AND PLACEMENT OF IMPLANT Right 02/27/2015   Procedure: REMOVAL OF TISSUE EXPANDER AND PLACEMENT OF NEW TISSUE EXPANDER;  Surgeon: Irene Limbo, MD;  Location: [facility];  Service: Plastics;  Laterality: Right;   SALPINGOOPHORECTOMY Right 11/22/2010   TISSUE EXPANDER PLACEMENT Left 03/02/2015   Procedure: TISSUE EXPANDER;  Surgeon: Irene Limbo, MD;  Location: [facility];  Service: Plastics;  Laterality: Left;   WISDOM TOOTH EXTRACTION  2001    Family History  Problem Relation Age of Onset   Hypertension Mother    Anesthesia problems Mother        post-op N/V   Prostate cancer Father 40   Depression Maternal Aunt    Dementia Maternal Grandmother    Cancer Maternal Grandfather    Heart attack Maternal Grandfather    Dementia Paternal Grandmother    Kidney cancer Paternal Grandmother        slow growing, no treatment   Cancer Paternal Grandfather    Prostate cancer Paternal Grandfather 110   Bone cancer Paternal Grandfather 50   Breast cancer Paternal Grandfather 73   Lung  cancer Paternal Grandfather        dx late 80s; smoker.  thought to be a 4th primary cancer   Social History:  reports that she has never smoked. She has never used smokeless tobacco. She reports current alcohol use. She reports that she does not use drugs.  Allergies:  Allergies  Allergen Reactions   Buprenorphine Hcl Itching   Morphine And Related Itching   Other Rash    DERMABOND   Penicillins Itching   Tegaderm Ag Mesh [Silver] Rash    Medications Prior to Admission  Medication Sig Dispense Refill   Ashwagandha 500 MG CAPS Take by mouth.     Cholecalciferol (VITAMIN D3) 50 MCG (2000 UT) TABS Take 2,000 Units by mouth at bedtime.     Cyanocobalamin (B-12) 100 MCG TABS Take by mouth.     Ferrous Sulfate (IRON) 325 (65 Fe) MG TABS Take by mouth.     folic acid (FOLVITE) 403 MCG tablet Take 400 mcg by mouth daily.     LORazepam (ATIVAN) 0.5  MG tablet Take 1 tablet (0.5 mg total) by mouth every 6 (six) hours as needed for anxiety. (Patient taking differently: Take 0.5 mg by mouth See admin instructions. Take 1 tablet (0.5 mg) by mouth scheduled at bedtime; may take an additional dose during the day if needed for anxiety) 30 tablet 3   Magnesium Hydroxide 400 MG CHEW Chew 400 mg by mouth at bedtime.     Melatonin 10 MG TABS Take by mouth.     Multiple Vitamin (AMLADEX) TABS Take by mouth.     Multiple Vitamins-Minerals (ADULT ONE DAILY GUMMIES PO) Take 2 tablets by mouth at bedtime.     Turmeric 500 MG CAPS Take 1 capsule by mouth daily. (Patient taking differently: Take 500 mg by mouth at bedtime.)     lidocaine-prilocaine (EMLA) cream Apply to affected area once 30 g 3   ondansetron (ZOFRAN) 8 MG tablet Take 1 tablet (8 mg total) by mouth every 8 (eight) hours as needed for nausea or vomiting. 30 tablet 1   prochlorperazine (COMPAZINE) 10 MG tablet Take 1 tablet (10 mg total) by mouth every 6 (six) hours as needed for nausea or vomiting. 30 tablet 1    No results found for this or  any previous visit (from the past 48 hour(s)). [place] LT RADIOACTIVE SEED LOC  Result Date: 11/28/2020 CLINICAL DATA:  Recently diagnosed recurrent breast cancer in the 3:30 o'clock position of the left breast, 4 cm from the nipple, 3 o'clock position of the left breast, 4 cm from the nipple and 3 o'clock position of the left breast, 7-8 cm from the nipple. These are marked with ribbon shaped, coil shaped and heart shaped biopsy marker clips respectively. She has been treated with neoadjuvant chemotherapy with no residual palpable abnormalities, no residual enhancement in the anterior outer left breast and minimal residual ill-defined enhancement in the posterior outer left breast on recent MRI. Pre lumpectomy localization of the heart shaped biopsy marker clip and either the ribbon shaped or coil shaped biopsy marker clip was requested. The patient has had bilateral nipple sparing mastectomies and has bilateral breast implants. EXAM: ULTRASOUND GUIDED RADIOACTIVE SEED LOCALIZATION OF THE LEFT BREAST X 2 COMPARISON:  Previous exam(s). FINDINGS: Patient presents for radioactive seed localization prior to left breast excision. I met with the patient and we discussed the procedure of seed localization including benefits and alternatives. We discussed the high likelihood of successful procedures. We discussed the risks of the procedures including infection, bleeding, tissue injury and further surgery. We discussed the low dose of radioactivity involved in the procedures. Informed, written consent was given. The usual time-out protocol was performed immediately prior to the procedures. SITE 1: BIOPSY MARKER CLIP IN THE 3 O'CLOCK POSITION OF THE LEFT BREAST, 4 CM FROM THE NIPPLE Using ultrasound guidance, sterile technique, 1% lidocaine and an I-125 radioactive seed, the biopsy marker clip in the 3 o'clock position of the left breast, 4 cm from the nipple, was localized using a caudal approach. Follow-up survey of the  patient confirms presence of the radioactive seed. Order number of I-125 seed:  [id number]. Total activity:  0.249 mCi reference Date: 09/29/2020 SITE 2: BIOPSY MARKER CLIP IN THE 3 O'CLOCK POSITION OF THE LEFT BREAST, 8 CM FROM THE NIPPLE Using ultrasound guidance, sterile technique, 1% lidocaine and an I-125 radioactive seed, the biopsy marker clip in the 3 o'clock position of the left breast, 8 cm from the nipple was localized using a caudal approach. Follow-up survey of the patient  confirms presence of the radioactive seed. Order number of I-125 seed:  [id number]. Total activity:  0.249 mCi reference Date: 09/29/2020 The patient tolerated the procedures well and was released from the [facility]. She was given instructions regarding seed removal. IMPRESSION: Radioactive seed localization left breast x 2. No apparent complications. Electronically Signed   By: Claudie Revering M.D.   On: 11/28/2020 15:57   [place] LT RADIOACTIVE SEED EA ADD LESION  Result Date: 11/28/2020 CLINICAL DATA:  Recently diagnosed recurrent breast cancer in the 3:30 o'clock position of the left breast, 4 cm from the nipple, 3 o'clock position of the left breast, 4 cm from the nipple and 3 o'clock position of the left breast, 7-8 cm from the nipple. These are marked with ribbon shaped, coil shaped and heart shaped biopsy marker clips respectively. She has been treated with neoadjuvant chemotherapy with no residual palpable abnormalities, no residual enhancement in the anterior outer left breast and minimal residual ill-defined enhancement in the posterior outer left breast on recent MRI. Pre lumpectomy localization of the heart shaped biopsy marker clip and either the ribbon shaped or coil shaped biopsy marker clip was requested. The patient has had bilateral nipple sparing mastectomies and has bilateral breast implants. EXAM: ULTRASOUND GUIDED RADIOACTIVE SEED LOCALIZATION OF THE LEFT BREAST X 2 COMPARISON:  Previous exam(s). FINDINGS: Patient  presents for radioactive seed localization prior to left breast excision. I met with the patient and we discussed the procedure of seed localization including benefits and alternatives. We discussed the high likelihood of successful procedures. We discussed the risks of the procedures including infection, bleeding, tissue injury and further surgery. We discussed the low dose of radioactivity involved in the procedures. Informed, written consent was given. The usual time-out protocol was performed immediately prior to the procedures. SITE 1: BIOPSY MARKER CLIP IN THE 3 O'CLOCK POSITION OF THE LEFT BREAST, 4 CM FROM THE NIPPLE Using ultrasound guidance, sterile technique, 1% lidocaine and an I-125 radioactive seed, the biopsy marker clip in the 3 o'clock position of the left breast, 4 cm from the nipple, was localized using a caudal approach. Follow-up survey of the patient confirms presence of the radioactive seed. Order number of I-125 seed:  [id number]. Total activity:  0.249 mCi reference Date: 09/29/2020 SITE 2: BIOPSY MARKER CLIP IN THE 3 O'CLOCK POSITION OF THE LEFT BREAST, 8 CM FROM THE NIPPLE Using ultrasound guidance, sterile technique, 1% lidocaine and an I-125 radioactive seed, the biopsy marker clip in the 3 o'clock position of the left breast, 8 cm from the nipple was localized using a caudal approach. Follow-up survey of the patient confirms presence of the radioactive seed. Order number of I-125 seed:  [id number]. Total activity:  0.249 mCi reference Date: 09/29/2020 The patient tolerated the procedures well and was released from the [facility]. She was given instructions regarding seed removal. IMPRESSION: Radioactive seed localization left breast x 2. No apparent complications. Electronically Signed   By: Claudie Revering M.D.   On: 11/28/2020 15:57    Review of Systems  All other systems reviewed and are negative.  Blood pressure 119/75, pulse 80, temperature 97.6 F (36.4 C), temperature source  Oral, resp. rate 18, height 5' 11" (1.803 m), weight 73.6 kg, last menstrual period 08/28/2020, SpO2 100 %. Physical Exam  Breast Nipples - No Discharge. Breast Lump - No Palpable Breast Mass. Lymphatic Head & Neck General Head & Neck Lymphatics: Bilateral - Description - Normal. Cv rrr Lungs effort normal  Assessment/Plan RECURRENT BREAST  CANCER, LEFT (C50.912) Story: WLE left breast cancer, recurrent discussed mr results with great radiologic response. will plan for two seeds and wle of these areas. discussed still may be disease not detected by mri. will not do node biopsy given negative nodes and no change in therapy with risk of le and prior mastectomy.  Rolm Bookbinder, MD 11/29/2020, 7:26 AM

## 2020-11-29 NOTE — Op Note (Signed)
Preoperative diagnosis: recurrent left breast cancer s/p kadcyla Postoperative diagnosis: Same as above Procedure: WLE anterior left breast recurrence WLE posterior left breast recurrence Surgeon: Dr. Serita Grammes Anesthesia: General Specimens: 1.  Left anterior breast tissue marked with paint containing seed and clip 2. Left posterior breast tissue marked with paint containing seed 3.  Additional posterior lateral margin marked short superior, long lateral, double deep containing clip 4.  Additional posterior superior margin marked short superior, long lateral, double deep Estimated blood loss: Minimal Complications: None Drains: None Sponge and count was correct at completion Decision to recovery in stable condition  Indications: 40 yof with ATM mutation and recurrent left breast cancer.  she noted a left breast mass lateral to nac.  this shows multiple hypoechoic masses in left breast with two measuring 6 mm and 4 mm in largest dimension.  she has undergone mri that shows a 1.5x1.1 cm area of enhancement in the lower outer posterior right breast and in the left breasts notes a 2.1 x 1.2 cm mass that is biopsied cancer (clips 7 mm apart) and there is additional area measuring 1.8 cm far posterior at 3 oclock. nodes are all normal. biopsy of the breast masses shows grade III idc with dcis that is er pos at 76, pr neg, her 2 pos, Ki 50%.  she has undergone primary systemic therapy and has mri at completion today that shows no abnormal nodes and no residual enhancement at anterior site and only minimal at posterior. We discussed wle of both of these areas.    Procedure: After informed consent was obtained the patient was given antibiotics and SCDs were in place.  She had  radioactive seeds placed prior to beginning.  I had these mammograms in the operating room.  She did undergo a pec block. She was placed under general anesthesia without complication.  She was prepped and draped in the standard  sterile surgical fashion.  Surgical timeout was then performed.  I removed the anterior one first. I made a curvlinear incision overlying the seed. I then identified the seed and removed the seed and the surrounding tissue in that attempt to get a clear margin around the lesion.  I then passed this off the table after marking it with paint Mammogram confirmed removal of the seed and the clips.  The posterior margin is the implant and there is not additional tissue in any direction. Hemostasis was then obtained.  I then closed this with 2-0 Vicryl.  The skin was closed with 3-0 Vicryl and 4-0 Monocryl.  Glue and Steri-Strips were applied.    I then removed the far posterior lateral lesion.   I made a curvlinear incision overlying the seed. I then identified the seed and removed the seed and the surrounding tissue in that attempt to get a clear margin around the lesion.  I then passed this off the table after marking it with paint. Mammogram confirmed removal of the seed.  The posterior margin is the implant and there is not additional tissue in any direction. I did remove additional margins and the lateral margin did contain the clip.  Hemostasis was then obtained.  I then closed this with 2-0 Vicryl.  The skin was closed with 3-0 Vicryl and 4-0 Monocryl.  Glue and Steri-Strips were applied.  She tolerated this well was extubated and transferred recovery stable condition.

## 2020-11-29 NOTE — Interval H&P Note (Signed)
History and Physical Interval Note:  11/29/2020 8:33 AM  Natasha Torres  has presented today for surgery, with the diagnosis of RECURRENT LEFT BREAST CANCER.  The various methods of treatment have been discussed with the patient and family. After consideration of risks, benefits and other options for treatment, the patient has consented to  Procedure(s): RADIOACTIVE SEED GUIDED WIDE LOCAL EXCISION OF LEFT BREAST CANCER (Left) as a surgical intervention.  The patient's history has been reviewed, patient examined, no change in status, stable for surgery.  I have reviewed the patient's chart and labs.  Questions were answered to the patient's satisfaction.     Rolm Bookbinder

## 2020-11-29 NOTE — Anesthesia Procedure Notes (Signed)
Anesthesia Regional Block: Pectoralis block   Pre-Anesthetic Checklist: , timeout performed,  Correct Patient, Correct Site, Correct Laterality,  Correct Procedure, Correct Position, site marked,  Risks and benefits discussed,  Surgical consent,  Pre-op evaluation,  At surgeon's request and post-op pain management  Laterality: Left  Prep: chloraprep       Needles:  Injection technique: Single-shot  Needle Type: Stimiplex     Needle Length: 9cm  Needle Gauge: 21     Additional Needles:   Procedures:,,,, ultrasound used (permanent image in chart),,    Narrative:  Start time: 11/29/2020 8:02 AM End time: 11/29/2020 8:07 AM Injection made incrementally with aspirations every 5 mL.  Performed by: Personally  Anesthesiologist: Lynda Rainwater, MD

## 2020-11-29 NOTE — Discharge Instructions (Addendum)
[facility] Office Phone Number [phone] POST OP INSTRUCTIONS Take 400 mg of ibuprofen every 8 hours or 650 mg tylenol every 6 hours for next 72 hours then as needed. Use ice several times daily also. Always review your discharge instruction sheet given to you by the facility where your surgery was performed.  IF YOU HAVE DISABILITY OR FAMILY LEAVE FORMS, YOU MUST BRING THEM TO THE OFFICE FOR PROCESSING.  DO NOT GIVE THEM TO YOUR DOCTOR.  A prescription for pain medication may be given to you upon discharge.  Take your pain medication as prescribed, if needed.  If narcotic pain medicine is not needed, then you may take acetaminophen (Tylenol), naprosyn (Alleve) or ibuprofen (Advil) as needed. Take your usually prescribed medications unless otherwise directed If you need a refill on your pain medication, please contact your pharmacy.  They will contact our office to request authorization.  Prescriptions will not be filled after 5pm or on week-ends. You should eat very light the first 24 hours after surgery, such as soup, crackers, pudding, etc.  Resume your normal diet the day after surgery. Most patients will experience some swelling and bruising in the breast.  Ice packs and a good support bra will help.  Wear the breast binder provided or a sports bra for 72 hours day and night.  After that wear a sports bra during the day until you return to the office. Swelling and bruising can take several days to resolve.  It is common to experience some constipation if taking pain medication after surgery.  Increasing fluid intake and taking a stool softener will usually help or prevent this problem from occurring.  A mild laxative (Milk of Magnesia or Miralax) should be taken according to package directions if there are no bowel movements after 48 hours. Unless discharge instructions indicate otherwise, you may remove your bandages 48 hours after surgery and you may shower at that time.  You  may have steri-strips (small skin tapes) in place directly over the incision.  These strips should be left on the skin for 7-10 days and will come off on their own.  If your surgeon used skin glue on the incision, you may shower in 24 hours.  The glue will flake off over the next 2-3 weeks.  Any sutures or staples will be removed at the office during your follow-up visit. ACTIVITIES:  You may resume regular daily activities (gradually increasing) beginning the next day.  Wearing a good support bra or sports bra minimizes pain and swelling.  You may have sexual intercourse when it is comfortable. You may drive when you no longer are taking prescription pain medication, you can comfortably wear a seatbelt, and you can safely maneuver your car and apply brakes. RETURN TO WORK:  ______________________________________________________________________________________ Dennis Bast should see your doctor in the office for a follow-up appointment approximately two weeks after your surgery.  Your doctor's nurse will typically make your follow-up appointment when she calls you with your pathology report.  Expect your pathology report 3-4 business days after your surgery.  You may call to check if you do not hear from [place] after three days. OTHER INSTRUCTIONS: _______________________________________________________________________________________________ _____________________________________________________________________________________________________________________________________ _____________________________________________________________________________________________________________________________________ _____________________________________________________________________________________________________________________________________  WHEN TO CALL DR WAKEFIELD: Fever over 101.0 Nausea and/or vomiting. Extreme swelling or bruising. Continued bleeding from incision. Increased pain, redness, or drainage from the  incision.  The clinic staff is available to answer your questions during regular business hours.  Please don't hesitate to call and ask to speak to  one of the nurses for clinical concerns.  If you have a medical emergency, go to the nearest emergency room or call 911.  A surgeon from [facility] is always on call at the hospital.  For further questions, please visit [email or web address]     Post Anesthesia Home Care Instructions  Activity: Get plenty of rest for the remainder of the day. A responsible individual must stay with you for 24 hours following the procedure.  For the next 24 hours, DO NOT: -Drive a car -Paediatric nurse -Drink alcoholic beverages -Take any medication unless instructed by your physician -Make any legal decisions or sign important papers.  Meals: Start with liquid foods such as gelatin or soup. Progress to regular foods as tolerated. Avoid greasy, spicy, heavy foods. If nausea and/or vomiting occur, drink only clear liquids until the nausea and/or vomiting subsides. Call your physician if vomiting continues.  Special Instructions/Symptoms: Your throat may feel dry or sore from the anesthesia or the breathing tube placed in your throat during surgery. If this causes discomfort, gargle with warm salt water. The discomfort should disappear within 24 hours.  If you had a scopolamine patch placed behind your ear for the management of post- operative nausea and/or vomiting:  1. The medication in the patch is effective for 72 hours, after which it should be removed.  Wrap patch in a tissue and discard in the trash. Wash hands thoroughly with soap and water. 2. You may remove the patch earlier than 72 hours if you experience unpleasant side effects which may include dry mouth, dizziness or visual disturbances. 3. Avoid touching the patch. Wash your hands with soap and water after contact with the patch.

## 2020-11-29 NOTE — Transfer of Care (Signed)
Immediate Anesthesia Transfer of Care Note  Patient: Natasha Torres  Procedure(s) Performed: RADIOACTIVE SEED GUIDED EXCISION OF LEFT BREAST CANCER x2 (Left: Breast)  Patient Location: PACU  Anesthesia Type:GA combined with regional for post-op pain  Level of Consciousness: awake, alert  and oriented  Airway & Oxygen Therapy: Patient Spontanous Breathing and Patient connected to nasal cannula oxygen  Post-op Assessment: Report given to RN and Post -op Vital signs reviewed and stable  Post vital signs: Reviewed and stable  Last Vitals:  Vitals Value Taken Time  BP    Temp 36.6 C 11/29/20 0948  Pulse 86 11/29/20 0950  Resp 12 11/29/20 0950  SpO2 100 % 11/29/20 0950  Vitals shown include unvalidated device data.  Last Pain:  Vitals:   11/29/20 0657  TempSrc: Oral  PainSc: 0-No pain      Patients Stated Pain Goal: 3 (97/74/14 2395)  Complications: No notable events documented.

## 2020-11-29 NOTE — Anesthesia Preprocedure Evaluation (Signed)
Anesthesia Evaluation  Patient identified by MRN, date of birth, ID band Patient awake    Reviewed: Allergy & Precautions, NPO status , Patient's Chart, lab work & pertinent test results  History of Anesthesia Complications (+) Family history of anesthesia reaction  Airway Mallampati: II  TM Distance: >3 FB Neck ROM: Full    Dental no notable dental hx. (+) Dental Advisory Given, Teeth Intact   Pulmonary neg pulmonary ROS,    Pulmonary exam normal breath sounds clear to auscultation       Cardiovascular negative cardio ROS Normal cardiovascular exam Rhythm:Regular Rate:Normal  Echo 08/26/2020 1. Left ventricular ejection fraction, by estimation, is 50 to 55%. The left ventricle has low normal function. The left ventricle has no regional wall motion abnormalities. Left ventricular diastolic parameters were normal. The average left ventricular global longitudinal strain is -26.3 %. The global longitudinal strain is normal.  2. Right ventricular systolic function is normal. The right ventricular size is normal. There is normal pulmonary artery systolic pressure.  3. The mitral valve is normal in structure. No evidence of mitral valve regurgitation. No evidence of mitral stenosis.  4. The aortic valve is tricuspid. Aortic valve regurgitation is not visualized. No aortic stenosis is present.    Neuro/Psych PSYCHIATRIC DISORDERS Anxiety Depression negative neurological ROS     GI/Hepatic negative GI ROS, Neg liver ROS,   Endo/Other  negative endocrine ROS  Renal/GU negative Renal ROS     Musculoskeletal negative musculoskeletal ROS (+)   Abdominal   Peds  Hematology  (+) Blood dyscrasia, anemia ,   Anesthesia Other Findings   Reproductive/Obstetrics                             Anesthesia Physical  Anesthesia Plan  ASA: 3  Anesthesia Plan: General   Post-op Pain Management:  Regional for  Post-op pain   Induction: Intravenous  PONV Risk Score and Plan: 3 and Ondansetron, Dexamethasone, Midazolam and Treatment may vary due to age or medical condition  Airway Management Planned: LMA  Additional Equipment: None  Intra-op Plan:   Post-operative Plan: Extubation in OR  Informed Consent: I have reviewed the patients History and Physical, chart, labs and discussed the procedure including the risks, benefits and alternatives for the proposed anesthesia with the patient or authorized representative who has indicated his/her understanding and acceptance.     Dental advisory given  Plan Discussed with: CRNA  Anesthesia Plan Comments:         Anesthesia Quick Evaluation

## 2020-11-29 NOTE — Anesthesia Procedure Notes (Signed)
Procedure Name: LMA Insertion Date/Time: 11/29/2020 8:52 AM Performed by: Lavonia Dana, CRNA Pre-anesthesia Checklist: Patient identified, Emergency Drugs available, Suction available and Patient being monitored Patient Re-evaluated:Patient Re-evaluated prior to induction Oxygen Delivery Method: Circle system utilized Preoxygenation: Pre-oxygenation with 100% oxygen Induction Type: IV induction Ventilation: Mask ventilation without difficulty LMA: LMA inserted LMA Size: 4.0 Number of attempts: 1 Airway Equipment and Method: Bite block Placement Confirmation: positive ETCO2 Tube secured with: Tape Dental Injury: Teeth and Oropharynx as per pre-operative assessment

## 2020-11-29 NOTE — Anesthesia Postprocedure Evaluation (Signed)
Anesthesia Post Note  Patient: Natasha Torres  Procedure(s) Performed: RADIOACTIVE SEED GUIDED EXCISION OF LEFT BREAST CANCER x2 (Left: Breast)     Patient location during evaluation: PACU Anesthesia Type: General Level of consciousness: awake and alert Pain management: pain level controlled Vital Signs Assessment: post-procedure vital signs reviewed and stable Respiratory status: spontaneous breathing, nonlabored ventilation and respiratory function stable Cardiovascular status: blood pressure returned to baseline and stable Postop Assessment: no apparent nausea or vomiting Anesthetic complications: no   No notable events documented.  Last Vitals:  Vitals:   11/29/20 1015 11/29/20 1023  BP: 116/61 108/69  Pulse: 75 61  Resp: 17 20  Temp:  (!) 36.4 C  SpO2: 99% 100%    Last Pain:  Vitals:   11/29/20 1023  TempSrc:   PainSc: [place]

## 2020-11-30 ENCOUNTER — Encounter (HOSPITAL_BASED_OUTPATIENT_CLINIC_OR_DEPARTMENT_OTHER): Payer: Self-pay | Admitting: General Surgery

## 2020-11-30 ENCOUNTER — Ambulatory Visit: Payer: BC Managed Care – PPO

## 2020-12-02 ENCOUNTER — Telehealth: Payer: Self-pay | Admitting: Licensed Clinical Social Worker

## 2020-12-02 NOTE — Telephone Encounter (Signed)
[place] [employer]  [employer] representative  attempted to contact patient by phone   to offer ongoing support as CSW will not be available during pt's appt next week. No answer. Left VM with direct contact number should patient like support today.        [place], [place] Worker [employer]

## 2020-12-05 ENCOUNTER — Encounter: Payer: Self-pay | Admitting: *Deleted

## 2020-12-06 ENCOUNTER — Encounter: Payer: Self-pay | Admitting: *Deleted

## 2020-12-06 LAB — SURGICAL PATHOLOGY

## 2020-12-07 NOTE — Progress Notes (Signed)
Patient Care Team: [name], MD as PCP - General (Internal Medicine) Nicholas Lose, MD as Consulting Physician (Hematology and Oncology) Rolm Bookbinder, MD as Consulting Physician (General Surgery) Irene Limbo, MD as Consulting Physician (Plastic Surgery) Rockwell Germany, RN as Registered Nurse Mauro Kaufmann, RN as Registered Nurse Sylvan Cheese, NP as Nurse Practitioner (Hematology and Oncology) Paula Compton, MD as Consulting Physician (Obstetrics and Gynecology)  DIAGNOSIS:    ICD-10-CM   1. Malignant neoplasm of upper-outer quadrant of left breast in female, estrogen receptor positive ([place])  C50.412    Z17.0       SUMMARY OF ONCOLOGIC HISTORY: Oncology History  Breast cancer of upper-outer quadrant of left female breast ([place])  08/13/2014 Mammogram   Left breast: 2 cm circumscribed mass    08/13/2014 Breast US   Left breast: two masses: #1: 1:00 10 x 9 x 13 mmm irregular; #2: 2:00: 16 x 7 x 14 mm; no LAD    08/27/2014 Initial Diagnosis   O/S excisional biopsy: 2 masses showing 1.5 cm and 0.9 cm IDC, Grade 3, ER + (90%), PR- (0%), HER-2 positive (ratio 2.5), Ki67 ~30%, Multifocal    08/27/2014 Clinical Stage   Stage IIA/IIB: T2/T3 N0    09/17/2014 Procedure   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 Otherwise negative at APC, ATM, BARD1, BMPR1A, BRCA1, BRCA2, BRIP1, CHD1, CDK4, CDKN2A, CHEK2, MLH1, MSH2, MSH6, MUTYH, NBN, PALB2, PMS2, PTEN, RAD51C, RAD51D, SMAD4, STK11, and TP53    09/20/2014 Breast MRI   RIGHT: 1 x 0.8 x 0.9 cm lobulated border mass in the retroareolar lower slight lateral area. LEFT: hematoma with surrounding adjacent enhancement encompassing a 4.6 x 4.9 x 4.1 cm area.     09/27/2014 - 01/10/2015 Neo-Adjuvant Chemotherapy   Neoadjuvant TCH Perjeta every 3 week 6 followed by Herceptin maintenance completed 09/21/2015    01/13/2015 Breast MRI   Postsurgical changes in left breast without residual  enhancing masses compatible with treatment response    02/10/2015 Definitive Surgery   Bilateral mastectomies: RIGHT: benign.  LEFT: complete path response;  0/3 sentinel nodes    02/10/2015 Pathologic Stage   ypT0 ypN0 ypM0    02/22/2015 -  Anti-estrogen oral therapy   Zoladex with tamoxifen, stopped Zoladex for intolerance, decreased tamoxifen to 10 mg daily.   11/17/2015 Survivorship   Survivorship care plan completed and given to patient     Relapse/Recurrence   Within the upper-outer left breast anterior depth there are 2 adjacent irregular enhancing masses (2.1 cm and 1.5 cm) compatible with biopsy-proven malignancy. Indeterminate enhancing mass within the outer left breast posterior depth (1.8 cm). Indeterminate enhancing mass within the outer lower posterior right breast. 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 ER 80%, PR 0%, Her 2 Positive   08/12/2020 Cancer Staging   Staging form: Breast, AJCC 8th Edition - Pathologic stage from 08/12/2020: Stage IA (pT1b, pN0, cM0, G2, ER+, PR-, HER2+) - Signed by Gardenia Phlegm, NP on 08/24/2020  Stage prefix: Initial diagnosis  Histologic grading system: 3 grade system    09/07/2020 -  Chemotherapy    Patient is on Treatment Plan: BREAST ADO-TRASTUZUMAB EMTANSINE [facility]) Q21D       11/29/2020 Surgery   Left anterior lumpectomy: Benign, left posterior lumpectomy: Benign, left posterior tissue lateral margin excision: Benign with 3 intramammary lymph nodes negative for malignancy.  Left posterior tissue superior margin: Benign     CHIEF  COMPLIANT: Cycle 5 Kadcyla  INTERVAL HISTORY: Natasha Torres is a 40 y.o. with above-mentioned history of  recurrent left breast cancer currently on chemotherapy with Kadcyla. She presents to the clinic today for treatment.  She underwent surgery and did extremely well.  It is amazing to see that she had a complete pathologic response to 4 doses  of Kadcyla.  Therefore we feel that she does not need any additional systemic chemotherapies but we can continue on with the Kadcyla.  She will also continue with the Zoladex injections until she gets oophorectomy.  ALLERGIES:  is allergic to buprenorphine hcl, morphine and related, other, penicillins, and tegaderm ag mesh [silver].  MEDICATIONS:  Current Outpatient Medications  Medication Sig Dispense Refill   letrozole (FEMARA) 2.5 MG tablet Take 1 tablet (2.5 mg total) by mouth daily. 90 tablet 3   Ashwagandha 500 MG CAPS Take by mouth.     Cholecalciferol (VITAMIN D3) 50 MCG (2000 UT) TABS Take 2,000 Units by mouth at bedtime.     Cyanocobalamin (B-12) 100 MCG TABS Take by mouth.     Ferrous Sulfate (IRON) 325 (65 Fe) MG TABS Take by mouth.     folic acid (FOLVITE) 876 MCG tablet Take 400 mcg by mouth daily.     lidocaine-prilocaine (EMLA) cream Apply to affected area once 30 g 3   LORazepam (ATIVAN) 0.5 MG tablet Take 1 tablet (0.5 mg total) by mouth every 6 (six) hours as needed for anxiety. 30 tablet 3   Magnesium Hydroxide 400 MG CHEW Chew 400 mg by mouth at bedtime.     Melatonin 10 MG TABS Take by mouth.     Multiple Vitamin (AMLADEX) TABS Take by mouth.     Multiple Vitamins-Minerals (ADULT ONE DAILY GUMMIES PO) Take 2 tablets by mouth at bedtime.     ondansetron (ZOFRAN) 8 MG tablet Take 1 tablet (8 mg total) by mouth every 8 (eight) hours as needed for nausea or vomiting. 30 tablet 1   prochlorperazine (COMPAZINE) 10 MG tablet Take 1 tablet (10 mg total) by mouth every 6 (six) hours as needed for nausea or vomiting. 30 tablet 1   Turmeric 500 MG CAPS Take 500 mg by mouth at bedtime.     No current facility-administered medications for this visit.    PHYSICAL EXAMINATION: ECOG PERFORMANCE STATUS: 1 - Symptomatic but completely ambulatory  Vitals:   12/08/20 0838  BP: 124/74  Pulse: 66  Resp: 18  Temp: 97.6 F (36.4 C)  SpO2: 100%   Filed Weights   12/08/20 0838   Weight: 166 lb 9.6 oz (75.6 kg)    LABORATORY DATA:  I have reviewed the data as listed CMP Latest Ref Rng & Units 12/08/2020 11/10/2020 10/21/2020  Glucose 70 - 99 mg/dL 82 107(H) 85  BUN 6 - 20 mg/dL 9 7 5(L)  Creatinine 0.44 - 1.00 mg/dL 0.67 0.74 0.69  Sodium 135 - 145 mmol/L 136 141 142  Potassium 3.5 - 5.1 mmol/L 3.5 3.5 3.7  Chloride 98 - 111 mmol/L 103 105 107  CO2 22 - 32 mmol/L 24 28 27   Calcium 8.9 - 10.3 mg/dL 9.0 9.5 9.3  Total Protein 6.5 - 8.1 g/dL 6.6 6.9 6.9  Total Bilirubin 0.3 - 1.2 mg/dL 0.4 0.4 0.3  Alkaline Phos 38 - 126 U/L 66 65 59  AST 15 - 41 U/L 39 64(H) 48(H)  ALT 0 - 44 U/L 38 63(H) 40    Lab Results  Component Value Date  WBC 4.6 12/08/2020   HGB 10.5 (L) 12/08/2020   HCT 31.0 (L) 12/08/2020   MCV 87.8 12/08/2020   PLT 172 12/08/2020   NEUTROABS 2.0 12/08/2020    ASSESSMENT & PLAN:  Breast cancer of upper-outer quadrant of left female breast 08/13/2014: Left breast IDC. Neo adj chemo with TCHP foll by Herceptin maintenance (path CR: Bil mastectomies and reconstruction), Tamoxifen (stoped and started due to intolerance) ATM gene mutation   08/12/20 Relapsed Breast Cancer: Left Breast UOQ 2.1 cm and 1.5 cm. Indeterminate 1.8 cm and Right breast indeterminate 1.5 cm.  Biopsy revealed IDC with DCIS, ER 80%, PR 0%, HER-2 3+ IHC positive   Bone scans 08/22/2020: Results awaited CT scans: scheduled for 08/24/20 Left breast Biopsy 3 o clock:Grade 2-3 IMC with lobular features ER 60%, PR 10%, Ki-67 20%, HER-2 3+ Rt Breast Biopsy: Benign   Treatment plan: 1.  chemotherapy with Kadcyla for currently unresectable breast cancer 4 cycles completed 11/10/2020 2. Surgery 11/29/2020: Pathologic complete response 3. Followed by XRT (have to discuss the pros and cons of radiation given ATM mutation) 4. Further adjuvant antiestrogen therapy (ovarian suppression or oophorectomy plus anastrozole) Adding Zoladex monthly to her treatment  plan ------------------------------------------------------------------ Treatment plan: Continue maintenance Kadcyla for 1 year. Antiestrogen therapy with Zoladex with letrozole  Letrozole counseling: We discussed the risks and benefits of anti-estrogen therapy with aromatase inhibitors. These include but not limited to insomnia, hot flashes, mood changes, vaginal dryness, bone density loss, and weight gain. We strongly believe that the benefits far outweigh the risks. Patient understands these risks and consented to starting treatment. Planned treatment duration is 7 years. She will start letrozole after July 4 holiday.  We discussed about completing Kadcyla by 06/15/2021 (she is trying to avoid paying her high deductible insurance into 2023) We will also perform scans at the end of the year.  I removed Benadryl and Zofran from her treatment plan Return to clinic every 3 weeks for Kadcyla.  And every other week for follow-up with me.    No orders of the defined types were placed in this encounter.  The patient has a good understanding of the overall plan. she agrees with it. she will call with any problems that may develop before the next visit here.  Total time spent: 30 mins including face to face time and time spent for planning, charting and coordination of care  Rulon Eisenmenger, MD, MPH 12/08/2020  I, Thana Ates, am acting as scribe for Dr. Nicholas Lose.  I have reviewed the above documentation for accuracy and completeness, and I agree with the above.

## 2020-12-07 NOTE — Assessment & Plan Note (Signed)
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 Surgery 11/29/2020: Pathologic complete response 3. Followed by Jeanie Cooks to discuss the pros and cons of radiation given ATM mutation) 4.Further adjuvant antiestrogen therapy (ovarian suppression or oophorectomy plus anastrozole) Adding Zoladex monthly to her treatment plan ------------------------------------------------------------------ Treatment plan: Continue maintenance Kadcyla for 1 year. Antiestrogen therapy with Zoladex with anastrozole  Return to clinic every 3 weeks for Kadcyla.

## 2020-12-08 ENCOUNTER — Inpatient Hospital Stay: Payer: BC Managed Care – PPO | Attending: Hematology and Oncology

## 2020-12-08 ENCOUNTER — Ambulatory Visit: Payer: BC Managed Care – PPO

## 2020-12-08 ENCOUNTER — Inpatient Hospital Stay (HOSPITAL_BASED_OUTPATIENT_CLINIC_OR_DEPARTMENT_OTHER): Payer: BC Managed Care – PPO | Admitting: Hematology and Oncology

## 2020-12-08 ENCOUNTER — Encounter: Payer: Self-pay | Admitting: *Deleted

## 2020-12-08 ENCOUNTER — Inpatient Hospital Stay: Payer: BC Managed Care – PPO

## 2020-12-08 ENCOUNTER — Other Ambulatory Visit: Payer: Self-pay

## 2020-12-08 ENCOUNTER — Other Ambulatory Visit: Payer: Self-pay | Admitting: *Deleted

## 2020-12-08 ENCOUNTER — Telehealth: Payer: Self-pay | Admitting: Oncology

## 2020-12-08 VITALS — BP 109/74 | HR 60 | Temp 97.9°F | Resp 18

## 2020-12-08 DIAGNOSIS — Z170 Estrogen receptor positive status [ER+]: Secondary | ICD-10-CM | POA: Insufficient documentation

## 2020-12-08 DIAGNOSIS — Z95828 Presence of other vascular implants and grafts: Secondary | ICD-10-CM

## 2020-12-08 DIAGNOSIS — Z5112 Encounter for antineoplastic immunotherapy: Secondary | ICD-10-CM | POA: Insufficient documentation

## 2020-12-08 DIAGNOSIS — C50412 Malignant neoplasm of upper-outer quadrant of left female breast: Secondary | ICD-10-CM

## 2020-12-08 DIAGNOSIS — Z79810 Long term (current) use of selective estrogen receptor modulators (SERMs): Secondary | ICD-10-CM | POA: Insufficient documentation

## 2020-12-08 DIAGNOSIS — Z79899 Other long term (current) drug therapy: Secondary | ICD-10-CM | POA: Insufficient documentation

## 2020-12-08 DIAGNOSIS — Z5111 Encounter for antineoplastic chemotherapy: Secondary | ICD-10-CM | POA: Diagnosis not present

## 2020-12-08 LAB — CMP (CANCER CENTER ONLY)
ALT: 38 U/L (ref 0–44)
AST: 39 U/L (ref 15–41)
Albumin: 3.5 g/dL (ref 3.5–5.0)
Alkaline Phosphatase: 66 U/L (ref 38–126)
Anion gap: 9 (ref 5–15)
BUN: 9 mg/dL (ref 6–20)
CO2: 24 mmol/L (ref 22–32)
Calcium: 9 mg/dL (ref 8.9–10.3)
Chloride: 103 mmol/L (ref 98–111)
Creatinine: 0.67 mg/dL (ref 0.44–1.00)
GFR, Estimated: >60 mL/min (ref >60)
Glucose, Bld: 82 mg/dL (ref 70–99)
Potassium: 3.5 mmol/L (ref 3.5–5.1)
Sodium: 136 mmol/L (ref 135–145)
Total Bilirubin: 0.4 mg/dL (ref 0.3–1.2)
Total Protein: 6.6 g/dL (ref 6.5–8.1)

## 2020-12-08 LAB — CBC WITH DIFFERENTIAL (CANCER CENTER ONLY)
Abs Immature Granulocytes: 0.01 10*3/uL (ref 0.00–0.07)
Basophils Absolute: 0.1 10*3/uL (ref 0.0–0.1)
Basophils Relative: 1 %
Eosinophils Absolute: 0.2 10*3/uL (ref 0.0–0.5)
Eosinophils Relative: 3 %
HCT: 31 % — ABNORMAL LOW (ref 36.0–46.0)
Hemoglobin: 10.5 g/dL — ABNORMAL LOW (ref 12.0–15.0)
Immature Granulocytes: 0 %
Lymphocytes Relative: 41 %
Lymphs Abs: 1.9 10*3/uL (ref 0.7–4.0)
MCH: 29.7 pg (ref 26.0–34.0)
MCHC: 33.9 g/dL (ref 30.0–36.0)
MCV: 87.8 fL (ref 80.0–100.0)
Monocytes Absolute: 0.5 10*3/uL (ref 0.1–1.0)
Monocytes Relative: 11 %
Neutro Abs: 2 10*3/uL (ref 1.7–7.7)
Neutrophils Relative %: 44 %
Platelet Count: 172 10*3/uL (ref 150–400)
RBC: 3.53 MIL/uL — ABNORMAL LOW (ref 3.87–5.11)
RDW: 12.4 % (ref 11.5–15.5)
WBC Count: 4.6 10*3/uL (ref 4.0–10.5)
nRBC: 0 % (ref 0.0–0.2)

## 2020-12-08 MED ORDER — SODIUM CHLORIDE 0.9 % IV SOLN
3.4000 mg/kg | Freq: Once | INTRAVENOUS | Status: AC
  Administered 2020-12-08: 260 mg via INTRAVENOUS
  Filled 2020-12-08: qty 8

## 2020-12-08 MED ORDER — SODIUM CHLORIDE 0.9% FLUSH
10.0000 mL | INTRAVENOUS | Status: DC | PRN
  Administered 2020-12-08: 10 mL
  Filled 2020-12-08: qty 10

## 2020-12-08 MED ORDER — SODIUM CHLORIDE 0.9 % IV SOLN
Freq: Once | INTRAVENOUS | Status: AC
  Filled 2020-12-08: qty 250

## 2020-12-08 MED ORDER — ACETAMINOPHEN 325 MG PO TABS
650.0000 mg | ORAL_TABLET | Freq: Once | ORAL | Status: AC
  Administered 2020-12-08: 650 mg via ORAL

## 2020-12-08 MED ORDER — GOSERELIN ACETATE 3.6 MG ~~LOC~~ IMPL
3.6000 mg | DRUG_IMPLANT | Freq: Once | SUBCUTANEOUS | Status: AC
  Administered 2020-12-08: 3.6 mg via SUBCUTANEOUS

## 2020-12-08 MED ORDER — GOSERELIN ACETATE 3.6 MG ~~LOC~~ IMPL
DRUG_IMPLANT | SUBCUTANEOUS | Status: AC
  Filled 2020-12-08: qty 3.6

## 2020-12-08 MED ORDER — HEPARIN SOD (PORK) LOCK FLUSH 100 UNIT/ML IV SOLN
500.0000 [IU] | Freq: Once | INTRAVENOUS | Status: AC | PRN
  Administered 2020-12-08: 500 [IU]
  Filled 2020-12-08: qty 5

## 2020-12-08 MED ORDER — SODIUM CHLORIDE 0.9% FLUSH
10.0000 mL | INTRAVENOUS | Status: DC | PRN
  Administered 2020-12-08: 10 mL via INTRAVENOUS
  Filled 2020-12-08: qty 10

## 2020-12-08 MED ORDER — FAMOTIDINE 20 MG IN NS 100 ML IVPB
INTRAVENOUS | Status: AC
  Filled 2020-12-08: qty 100

## 2020-12-08 MED ORDER — ACETAMINOPHEN 325 MG PO TABS
ORAL_TABLET | ORAL | Status: AC
  Filled 2020-12-08: qty 2

## 2020-12-08 MED ORDER — FAMOTIDINE 20 MG IN NS 100 ML IVPB
20.0000 mg | Freq: Once | INTRAVENOUS | Status: AC
  Administered 2020-12-08: 20 mg via INTRAVENOUS

## 2020-12-08 MED ORDER — LETROZOLE 2.5 MG PO TABS: 2.5000 mg | ORAL_TABLET | Freq: Every day | ORAL | 3 refills | Status: DC

## 2020-12-08 NOTE — Telephone Encounter (Signed)
Scheduled appointment per 06/23 los. Patient is aware.

## 2020-12-08 NOTE — Patient Instructions (Signed)
[facility] Discharge Instructions for Patients Receiving Chemotherapy  Today you received the following chemotherapy agents kadcyla  To help prevent nausea and vomiting after your treatment, we encourage you to take your nausea medication as directed   If you develop nausea and vomiting that is not controlled by your nausea medication, call the clinic.   BELOW ARE SYMPTOMS THAT SHOULD BE REPORTED IMMEDIATELY:  *FEVER GREATER THAN 100.5 F  *CHILLS WITH OR WITHOUT FEVER  NAUSEA AND VOMITING THAT IS NOT CONTROLLED WITH YOUR NAUSEA MEDICATION  *UNUSUAL SHORTNESS OF BREATH  *UNUSUAL BRUISING OR BLEEDING  TENDERNESS IN MOUTH AND THROAT WITH OR WITHOUT PRESENCE OF ULCERS  *URINARY PROBLEMS  *BOWEL PROBLEMS  UNUSUAL RASH Items with * indicate a potential emergency and should be followed up as soon as possible.  Feel free to call the clinic should you have any questions or concerns. The clinic phone number is [phone].  Please show the CHEMO ALERT CARD at check-in to the Emergency Department and triage nurse.   

## 2020-12-08 NOTE — Progress Notes (Signed)
a 

## 2020-12-08 NOTE — Progress Notes (Signed)
Tolerated Kadcyla over 30 minutes with tylenol and pepcid as premeds.

## 2020-12-27 ENCOUNTER — Other Ambulatory Visit: Payer: Self-pay | Admitting: *Deleted

## 2020-12-27 ENCOUNTER — Telehealth: Payer: Self-pay | Admitting: Adult Health

## 2020-12-27 DIAGNOSIS — C50412 Malignant neoplasm of upper-outer quadrant of left female breast: Secondary | ICD-10-CM

## 2020-12-27 NOTE — Telephone Encounter (Signed)
Scheduled appointment per 07/12 sch msg. Patient is aware. 

## 2020-12-28 ENCOUNTER — Inpatient Hospital Stay: Payer: BC Managed Care – PPO | Attending: Adult Health

## 2020-12-28 ENCOUNTER — Other Ambulatory Visit: Payer: Self-pay

## 2020-12-28 DIAGNOSIS — C50412 Malignant neoplasm of upper-outer quadrant of left female breast: Secondary | ICD-10-CM | POA: Diagnosis not present

## 2020-12-28 DIAGNOSIS — Z95828 Presence of other vascular implants and grafts: Secondary | ICD-10-CM

## 2020-12-28 DIAGNOSIS — Z170 Estrogen receptor positive status [ER+]: Secondary | ICD-10-CM | POA: Insufficient documentation

## 2020-12-28 DIAGNOSIS — Z5112 Encounter for antineoplastic immunotherapy: Secondary | ICD-10-CM | POA: Diagnosis not present

## 2020-12-28 LAB — CBC WITH DIFFERENTIAL (CANCER CENTER ONLY)
Abs Immature Granulocytes: 0.01 10*3/uL (ref 0.00–0.07)
Basophils Absolute: 0.1 10*3/uL (ref 0.0–0.1)
Basophils Relative: 1 %
Eosinophils Absolute: 0.2 10*3/uL (ref 0.0–0.5)
Eosinophils Relative: 3 %
HCT: 35.9 % — ABNORMAL LOW (ref 36.0–46.0)
Hemoglobin: 11.8 g/dL — ABNORMAL LOW (ref 12.0–15.0)
Immature Granulocytes: 0 %
Lymphocytes Relative: 37 %
Lymphs Abs: 1.8 10*3/uL (ref 0.7–4.0)
MCH: 29.4 pg (ref 26.0–34.0)
MCHC: 32.9 g/dL (ref 30.0–36.0)
MCV: 89.5 fL (ref 80.0–100.0)
Monocytes Absolute: 0.5 10*3/uL (ref 0.1–1.0)
Monocytes Relative: 10 %
Neutro Abs: 2.4 10*3/uL (ref 1.7–7.7)
Neutrophils Relative %: 49 %
Platelet Count: 190 10*3/uL (ref 150–400)
RBC: 4.01 MIL/uL (ref 3.87–5.11)
RDW: 12.3 % (ref 11.5–15.5)
WBC Count: 5 10*3/uL (ref 4.0–10.5)
nRBC: 0 % (ref 0.0–0.2)

## 2020-12-28 LAB — CMP (CANCER CENTER ONLY)
ALT: 74 U/L — ABNORMAL HIGH (ref 0–44)
AST: 64 U/L — ABNORMAL HIGH (ref 15–41)
Albumin: 3.7 g/dL (ref 3.5–5.0)
Alkaline Phosphatase: 86 U/L (ref 38–126)
Anion gap: 8 (ref 5–15)
BUN: 8 mg/dL (ref 6–20)
CO2: 27 mmol/L (ref 22–32)
Calcium: 10.2 mg/dL (ref 8.9–10.3)
Chloride: 107 mmol/L (ref 98–111)
Creatinine: 0.71 mg/dL (ref 0.44–1.00)
GFR, Estimated: >60 mL/min (ref >60)
Glucose, Bld: 83 mg/dL (ref 70–99)
Potassium: 3.9 mmol/L (ref 3.5–5.1)
Sodium: 142 mmol/L (ref 135–145)
Total Bilirubin: 0.3 mg/dL (ref 0.3–1.2)
Total Protein: 7.5 g/dL (ref 6.5–8.1)

## 2020-12-28 MED ORDER — SODIUM CHLORIDE 0.9% FLUSH
10.0000 mL | INTRAVENOUS | Status: AC | PRN
Start: 2020-12-28 — End: 2020-12-28
  Administered 2020-12-28: 10 mL
  Filled 2020-12-28: qty 10

## 2020-12-28 MED ORDER — HEPARIN SOD (PORK) LOCK FLUSH 100 UNIT/ML IV SOLN
500.0000 [IU] | INTRAVENOUS | Status: AC | PRN
  Administered 2020-12-28: 500 [IU]
  Filled 2020-12-28: qty 5

## 2020-12-29 ENCOUNTER — Inpatient Hospital Stay: Payer: BC Managed Care – PPO

## 2020-12-29 ENCOUNTER — Ambulatory Visit: Payer: Self-pay | Admitting: Physical Therapy

## 2020-12-29 VITALS — BP 115/63 | HR 67 | Temp 98.3°F | Resp 18 | Wt 164.5 lb

## 2020-12-29 DIAGNOSIS — C50412 Malignant neoplasm of upper-outer quadrant of left female breast: Secondary | ICD-10-CM | POA: Diagnosis not present

## 2020-12-29 DIAGNOSIS — Z95828 Presence of other vascular implants and grafts: Secondary | ICD-10-CM

## 2020-12-29 DIAGNOSIS — Z170 Estrogen receptor positive status [ER+]: Secondary | ICD-10-CM

## 2020-12-29 LAB — CBC WITH DIFFERENTIAL (CANCER CENTER ONLY)
Abs Immature Granulocytes: 0.01 10*3/uL (ref 0.00–0.07)
Basophils Absolute: 0.1 10*3/uL (ref 0.0–0.1)
Basophils Relative: 1 %
Eosinophils Absolute: 0.1 10*3/uL (ref 0.0–0.5)
Eosinophils Relative: 2 %
HCT: 33.3 % — ABNORMAL LOW (ref 36.0–46.0)
Hemoglobin: 11.3 g/dL — ABNORMAL LOW (ref 12.0–15.0)
Immature Granulocytes: 0 %
Lymphocytes Relative: 44 %
Lymphs Abs: 2.4 10*3/uL (ref 0.7–4.0)
MCH: 29.5 pg (ref 26.0–34.0)
MCHC: 33.9 g/dL (ref 30.0–36.0)
MCV: 86.9 fL (ref 80.0–100.0)
Monocytes Absolute: 0.6 10*3/uL (ref 0.1–1.0)
Monocytes Relative: 10 %
Neutro Abs: 2.3 10*3/uL (ref 1.7–7.7)
Neutrophils Relative %: 43 %
Platelet Count: 189 10*3/uL (ref 150–400)
RBC: 3.83 MIL/uL — ABNORMAL LOW (ref 3.87–5.11)
RDW: 12.3 % (ref 11.5–15.5)
WBC Count: 5.4 10*3/uL (ref 4.0–10.5)
nRBC: 0 % (ref 0.0–0.2)

## 2020-12-29 LAB — CMP (CANCER CENTER ONLY)
ALT: 54 U/L — ABNORMAL HIGH (ref 0–44)
AST: 46 U/L — ABNORMAL HIGH (ref 15–41)
Albumin: 3.7 g/dL (ref 3.5–5.0)
Alkaline Phosphatase: 86 U/L (ref 38–126)
Anion gap: 8 (ref 5–15)
BUN: 6 mg/dL (ref 6–20)
CO2: 28 mmol/L (ref 22–32)
Calcium: 9.7 mg/dL (ref 8.9–10.3)
Chloride: 105 mmol/L (ref 98–111)
Creatinine: 0.69 mg/dL (ref 0.44–1.00)
GFR, Estimated: >60 mL/min (ref >60)
Glucose, Bld: 87 mg/dL (ref 70–99)
Potassium: 3.6 mmol/L (ref 3.5–5.1)
Sodium: 141 mmol/L (ref 135–145)
Total Bilirubin: 0.3 mg/dL (ref 0.3–1.2)
Total Protein: 7.3 g/dL (ref 6.5–8.1)

## 2020-12-29 MED ORDER — FAMOTIDINE 20 MG IN NS 100 ML IVPB
INTRAVENOUS | Status: AC
  Filled 2020-12-29: qty 100

## 2020-12-29 MED ORDER — SODIUM CHLORIDE 0.9% FLUSH
10.0000 mL | INTRAVENOUS | Status: DC | PRN
  Administered 2020-12-29: 10 mL via INTRAVENOUS
  Filled 2020-12-29: qty 10

## 2020-12-29 MED ORDER — FAMOTIDINE 20 MG IN NS 100 ML IVPB
20.0000 mg | Freq: Once | INTRAVENOUS | Status: AC
  Administered 2020-12-29: 20 mg via INTRAVENOUS

## 2020-12-29 MED ORDER — ACETAMINOPHEN 325 MG PO TABS
650.0000 mg | ORAL_TABLET | Freq: Once | ORAL | Status: AC
  Administered 2020-12-29: 650 mg via ORAL

## 2020-12-29 MED ORDER — SODIUM CHLORIDE 0.9 % IV SOLN
Freq: Once | INTRAVENOUS | Status: AC
Start: 2020-12-29 — End: 2020-12-29
  Filled 2020-12-29: qty 250

## 2020-12-29 MED ORDER — HEPARIN SOD (PORK) LOCK FLUSH 100 UNIT/ML IV SOLN
500.0000 [IU] | Freq: Once | INTRAVENOUS | Status: AC | PRN
  Administered 2020-12-29: 500 [IU]
  Filled 2020-12-29: qty 5

## 2020-12-29 MED ORDER — SODIUM CHLORIDE 0.9 % IV SOLN
3.4000 mg/kg | Freq: Once | INTRAVENOUS | Status: AC
  Administered 2020-12-29: 260 mg via INTRAVENOUS
  Filled 2020-12-29: qty 8

## 2020-12-29 MED ORDER — SODIUM CHLORIDE 0.9% FLUSH
10.0000 mL | INTRAVENOUS | Status: DC | PRN
  Administered 2020-12-29: 10 mL
  Filled 2020-12-29: qty 10

## 2020-12-29 MED ORDER — ACETAMINOPHEN 325 MG PO TABS
ORAL_TABLET | ORAL | Status: AC
  Filled 2020-12-29: qty 2

## 2020-12-29 NOTE — Progress Notes (Signed)
Pt declined to stay for 30 min observation, pt left in stable condition, ambulatory to lobby without complaints.

## 2020-12-29 NOTE — Patient Instructions (Signed)

## 2020-12-29 NOTE — Patient Instructions (Signed)
[facility] Discharge Instructions for Patients Receiving Chemotherapy  Today you received the following chemotherapy agents kadcyla  To help prevent nausea and vomiting after your treatment, we encourage you to take your nausea medication as directed   If you develop nausea and vomiting that is not controlled by your nausea medication, call the clinic.   BELOW ARE SYMPTOMS THAT SHOULD BE REPORTED IMMEDIATELY:  *FEVER GREATER THAN 100.5 F  *CHILLS WITH OR WITHOUT FEVER  NAUSEA AND VOMITING THAT IS NOT CONTROLLED WITH YOUR NAUSEA MEDICATION  *UNUSUAL SHORTNESS OF BREATH  *UNUSUAL BRUISING OR BLEEDING  TENDERNESS IN MOUTH AND THROAT WITH OR WITHOUT PRESENCE OF ULCERS  *URINARY PROBLEMS  *BOWEL PROBLEMS  UNUSUAL RASH Items with * indicate a potential emergency and should be followed up as soon as possible.  Feel free to call the clinic should you have any questions or concerns. The clinic phone number is [phone].  Please show the CHEMO ALERT CARD at check-in to the Emergency Department and triage nurse.   

## 2021-01-03 ENCOUNTER — Telehealth: Payer: Self-pay | Admitting: Hematology and Oncology

## 2021-01-03 NOTE — Telephone Encounter (Signed)
Scheduled appts per 7/19 sch msg. Pt aware.  

## 2021-01-05 ENCOUNTER — Inpatient Hospital Stay: Payer: BC Managed Care – PPO

## 2021-01-05 ENCOUNTER — Other Ambulatory Visit: Payer: Self-pay

## 2021-01-05 VITALS — BP 121/75 | HR 61 | Temp 98.3°F | Resp 16

## 2021-01-05 DIAGNOSIS — C50412 Malignant neoplasm of upper-outer quadrant of left female breast: Secondary | ICD-10-CM

## 2021-01-05 DIAGNOSIS — Z170 Estrogen receptor positive status [ER+]: Secondary | ICD-10-CM

## 2021-01-05 MED ORDER — GOSERELIN ACETATE 3.6 MG ~~LOC~~ IMPL
DRUG_IMPLANT | SUBCUTANEOUS | Status: AC
  Filled 2021-01-05: qty 3.6

## 2021-01-05 MED ORDER — GOSERELIN ACETATE 3.6 MG ~~LOC~~ IMPL
3.6000 mg | DRUG_IMPLANT | Freq: Once | SUBCUTANEOUS | Status: AC
  Administered 2021-01-05: 3.6 mg via SUBCUTANEOUS

## 2021-01-05 NOTE — Patient Instructions (Signed)
Goserelin injection What is this medication? GOSERELIN (GOE se rel in) is similar to a hormone found in the body. It lowers the amount of sex hormones that the body makes. Men will have lower testosterone levels and women will have lower estrogen levels while taking this medicine. In men, this medicine is used to treat prostate cancer; the injection is either given once per month or once every 12 weeks. A once per month injection (only) is used to treat women with endometriosis, dysfunctional uterine bleeding, or advanced breast cancer. This medicine may be used for other purposes; ask your health care provider or pharmacist if you have questions. COMMON BRAND NAME(S): Zoladex What should I tell my care team before I take this medication? They need to know if you have any of these conditions: bone problems diabetes heart disease history of irregular heartbeat an unusual or allergic reaction to goserelin, other medicines, foods, dyes, or preservatives pregnant or trying to get pregnant breast-feeding How should I use this medication? This medicine is for injection under the skin. It is given by a health care professional in a hospital or clinic setting. Talk to your pediatrician regarding the use of this medicine in children. Special care may be needed. Overdosage: If you think you have taken too much of this medicine contact a poison control center or emergency room at once. NOTE: This medicine is only for you. Do not share this medicine with others. What if I miss a dose? It is important not to miss your dose. Call your doctor or health care professional if you are unable to keep an appointment. What may interact with this medication? Do not take this medicine with any of the following medications: cisapride dronedarone pimozide thioridazine This medicine may also interact with the following medications: other medicines that prolong the QT interval (an abnormal heart rhythm) This list  may not describe all possible interactions. Give your health care provider a list of all the medicines, herbs, non-prescription drugs, or dietary supplements you use. Also tell them if you smoke, drink alcohol, or use illegal drugs. Some items may interact with your medicine. What should I watch for while using this medication? Visit your doctor or health care provider for regular checks on your progress. Your symptoms may appear to get worse during the first weeks of this therapy. Tell your doctor or healthcare provider if your symptoms do not start to get better or if they get worse after this time. Your bones may get weaker if you take this medicine for a long time. If you smoke or frequently drink alcohol you may increase your risk of bone loss. A family history of osteoporosis, chronic use of drugs for seizures (convulsions), or corticosteroids can also increase your risk of bone loss. Talk to your doctor about how to keep your bones strong. This medicine should stop regular monthly menstruation in women. Tell your doctor if you continue to menstruate. Women should not become pregnant while taking this medicine or for 12 weeks after stopping this medicine. Women should inform their doctor if they wish to become pregnant or think they might be pregnant. There is a potential for serious side effects to an unborn child. Talk to your health care professional or pharmacist for more information. Do not breast-feed an infant while taking this medicine. Men should inform their doctors if they wish to father a child. This medicine may lower sperm counts. Talk to your health care professional or pharmacist for more information. This medicine may   increase blood sugar. Ask your healthcare provider if changes in diet or medicines are needed if you have diabetes. What side effects may I notice from receiving this medication? Side effects that you should report to your doctor or health care professional as soon as  possible: allergic reactions like skin rash, itching or hives, swelling of the face, lips, or tongue bone pain breathing problems changes in vision chest pain feeling faint or lightheaded, falls fever, chills pain, swelling, warmth in the leg pain, tingling, numbness in the hands or feet signs and symptoms of high blood sugar such as being more thirsty or hungry or having to urinate more than normal. You may also feel very tired or have blurry vision signs and symptoms of low blood pressure like dizziness; feeling faint or lightheaded, falls; unusually weak or tired stomach pain swelling of the ankles, feet, hands trouble passing urine or change in the amount of urine unusually high or low blood pressure unusually weak or tired Side effects that usually do not require medical attention (report to your doctor or health care professional if they continue or are bothersome): change in sex drive or performance changes in breast size in both males and females changes in emotions or moods headache hot flashes irritation at site where injected loss of appetite skin problems like acne, dry skin vaginal dryness This list may not describe all possible side effects. Call your doctor for medical advice about side effects. You may report side effects to FDA at 1-800-FDA-1088. Where should I keep my medication? This drug is given in a hospital or clinic and will not be stored at home. NOTE: This sheet is a summary. It may not cover all possible information. If you have questions about this medicine, talk to your doctor, pharmacist, or health care provider.  2022 Elsevier/Gold Standard (2018-09-22 14:05:56)  

## 2021-01-19 ENCOUNTER — Other Ambulatory Visit: Payer: Self-pay | Admitting: *Deleted

## 2021-01-20 ENCOUNTER — Inpatient Hospital Stay (HOSPITAL_BASED_OUTPATIENT_CLINIC_OR_DEPARTMENT_OTHER): Payer: BC Managed Care – PPO | Admitting: Adult Health

## 2021-01-20 ENCOUNTER — Inpatient Hospital Stay: Payer: BC Managed Care – PPO | Attending: Adult Health

## 2021-01-20 ENCOUNTER — Inpatient Hospital Stay: Payer: BC Managed Care – PPO

## 2021-01-20 ENCOUNTER — Other Ambulatory Visit: Payer: Self-pay | Admitting: *Deleted

## 2021-01-20 ENCOUNTER — Other Ambulatory Visit: Payer: Self-pay

## 2021-01-20 ENCOUNTER — Encounter: Payer: Self-pay | Admitting: Adult Health

## 2021-01-20 VITALS — BP 114/65 | HR 62 | Temp 97.2°F | Resp 18 | Wt 164.2 lb

## 2021-01-20 DIAGNOSIS — R5383 Other fatigue: Secondary | ICD-10-CM | POA: Insufficient documentation

## 2021-01-20 DIAGNOSIS — C50412 Malignant neoplasm of upper-outer quadrant of left female breast: Secondary | ICD-10-CM

## 2021-01-20 DIAGNOSIS — E559 Vitamin D deficiency, unspecified: Secondary | ICD-10-CM | POA: Insufficient documentation

## 2021-01-20 DIAGNOSIS — Z8051 Family history of malignant neoplasm of kidney: Secondary | ICD-10-CM | POA: Diagnosis not present

## 2021-01-20 DIAGNOSIS — Z452 Encounter for adjustment and management of vascular access device: Secondary | ICD-10-CM | POA: Diagnosis not present

## 2021-01-20 DIAGNOSIS — Z801 Family history of malignant neoplasm of trachea, bronchus and lung: Secondary | ICD-10-CM | POA: Diagnosis not present

## 2021-01-20 DIAGNOSIS — Z803 Family history of malignant neoplasm of breast: Secondary | ICD-10-CM | POA: Diagnosis not present

## 2021-01-20 DIAGNOSIS — Z9013 Acquired absence of bilateral breasts and nipples: Secondary | ICD-10-CM | POA: Insufficient documentation

## 2021-01-20 DIAGNOSIS — Z170 Estrogen receptor positive status [ER+]: Secondary | ICD-10-CM | POA: Diagnosis not present

## 2021-01-20 DIAGNOSIS — R7989 Other specified abnormal findings of blood chemistry: Secondary | ICD-10-CM | POA: Diagnosis not present

## 2021-01-20 DIAGNOSIS — Z95828 Presence of other vascular implants and grafts: Secondary | ICD-10-CM

## 2021-01-20 DIAGNOSIS — Z8042 Family history of malignant neoplasm of prostate: Secondary | ICD-10-CM | POA: Diagnosis not present

## 2021-01-20 DIAGNOSIS — Z5111 Encounter for antineoplastic chemotherapy: Secondary | ICD-10-CM | POA: Diagnosis not present

## 2021-01-20 LAB — CBC WITH DIFFERENTIAL (CANCER CENTER ONLY)
Abs Immature Granulocytes: 0.01 10*3/uL (ref 0.00–0.07)
Basophils Absolute: 0.1 10*3/uL (ref 0.0–0.1)
Basophils Relative: 1 %
Eosinophils Absolute: 0.1 10*3/uL (ref 0.0–0.5)
Eosinophils Relative: 1 %
HCT: 31.3 % — ABNORMAL LOW (ref 36.0–46.0)
Hemoglobin: 10.6 g/dL — ABNORMAL LOW (ref 12.0–15.0)
Immature Granulocytes: 0 %
Lymphocytes Relative: 35 %
Lymphs Abs: 2.1 10*3/uL (ref 0.7–4.0)
MCH: 29.5 pg (ref 26.0–34.0)
MCHC: 33.9 g/dL (ref 30.0–36.0)
MCV: 87.2 fL (ref 80.0–100.0)
Monocytes Absolute: 0.4 10*3/uL (ref 0.1–1.0)
Monocytes Relative: 7 %
Neutro Abs: 3.3 10*3/uL (ref 1.7–7.7)
Neutrophils Relative %: 56 %
Platelet Count: 155 10*3/uL (ref 150–400)
RBC: 3.59 MIL/uL — ABNORMAL LOW (ref 3.87–5.11)
RDW: 12.6 % (ref 11.5–15.5)
WBC Count: 5.9 10*3/uL (ref 4.0–10.5)
nRBC: 0 % (ref 0.0–0.2)

## 2021-01-20 LAB — CMP (CANCER CENTER ONLY)
ALT: 69 U/L — ABNORMAL HIGH (ref 0–44)
AST: 119 U/L — ABNORMAL HIGH (ref 15–41)
Albumin: 3.7 g/dL (ref 3.5–5.0)
Alkaline Phosphatase: 77 U/L (ref 38–126)
Anion gap: 8 (ref 5–15)
BUN: 6 mg/dL (ref 6–20)
CO2: 26 mmol/L (ref 22–32)
Calcium: 9.4 mg/dL (ref 8.9–10.3)
Chloride: 106 mmol/L (ref 98–111)
Creatinine: 0.69 mg/dL (ref 0.44–1.00)
GFR, Estimated: >60 mL/min (ref >60)
Glucose, Bld: 114 mg/dL — ABNORMAL HIGH (ref 70–99)
Potassium: 3.7 mmol/L (ref 3.5–5.1)
Sodium: 140 mmol/L (ref 135–145)
Total Bilirubin: 0.3 mg/dL (ref 0.3–1.2)
Total Protein: 6.7 g/dL (ref 6.5–8.1)

## 2021-01-20 MED ORDER — SODIUM CHLORIDE 0.9% FLUSH
10.0000 mL | INTRAVENOUS | Status: DC | PRN
  Administered 2021-01-20: 10 mL
  Filled 2021-01-20: qty 10

## 2021-01-20 MED ORDER — HEPARIN SOD (PORK) LOCK FLUSH 100 UNIT/ML IV SOLN
500.0000 [IU] | Freq: Once | INTRAVENOUS | Status: AC | PRN
  Administered 2021-01-20: 500 [IU]
  Filled 2021-01-20: qty 5

## 2021-01-20 MED ORDER — SODIUM CHLORIDE 0.9% FLUSH
10.0000 mL | INTRAVENOUS | Status: AC | PRN
Start: 2021-01-20 — End: 2021-01-20
  Administered 2021-01-20: 10 mL
  Filled 2021-01-20: qty 10

## 2021-01-20 NOTE — Progress Notes (Signed)
Patient's liver enzymes came back elevated AST 119. Checked with Wilber Bihari, NP, tx will be held today and they will follow up with her Monday regarding the next steps. Patient made aware, states understanding.

## 2021-01-20 NOTE — Progress Notes (Signed)
[facility] Follow up:    [name], MD [place] [address]   DIAGNOSIS: Cancer Staging Breast cancer of upper-outer quadrant of left female breast [facility]) Staging form: Breast, AJCC 7th Edition - Clinical stage from 08/27/2014: Stage IIA (T2, N0, M0) - Unsigned Laterality: Left - Pathologic: T0, N0, cM0 - Unsigned Laterality: Left Staging form: Breast, AJCC 8th Edition - Pathologic stage from 08/12/2020: Stage IA (pT1b, pN0, cM0, G2, ER+, PR-, HER2+) - Signed by Gardenia Phlegm, NP on 08/24/2020 Stage prefix: Initial diagnosis Histologic grading system: 3 grade system   SUMMARY OF ONCOLOGIC HISTORY: Oncology History  Breast cancer of upper-outer quadrant of left female breast ([place])  08/13/2014 Mammogram   Left breast: 2 cm circumscribed mass    08/13/2014 Breast US   Left breast: two masses: #1: 1:00 10 x 9 x 13 mmm irregular; #2: 2:00: 16 x 7 x 14 mm; no LAD    08/27/2014 Initial Diagnosis   O/S excisional biopsy: 2 masses showing 1.5 cm and 0.9 cm IDC, Grade 3, ER + (90%), PR- (0%), HER-2 positive (ratio 2.5), Ki67 ~30%, Multifocal    08/27/2014 Clinical Stage   Stage IIA/IIB: T2/T3 N0    09/17/2014 Procedure   MyRisk panel ([employer]) 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 Otherwise negative at APC, ATM, BARD1, BMPR1A, BRCA1, BRCA2, BRIP1, CHD1, CDK4, CDKN2A, CHEK2, MLH1, MSH2, MSH6, MUTYH, NBN, PALB2, PMS2, PTEN, RAD51C, RAD51D, SMAD4, STK11, and TP53    09/20/2014 Breast MRI   RIGHT: 1 x 0.8 x 0.9 cm lobulated border mass in the retroareolar lower slight lateral area. LEFT: hematoma with surrounding adjacent enhancement encompassing a 4.6 x 4.9 x 4.1 cm area.     09/27/2014 - 01/10/2015 Neo-Adjuvant Chemotherapy   Neoadjuvant TCH [place]eta every 3 week 6 followed by Herceptin maintenance completed 09/21/2015    01/13/2015 Breast MRI   Postsurgical changes in left breast without residual enhancing masses compatible with  treatment response    02/10/2015 Definitive Surgery   Bilateral mastectomies: RIGHT: benign.  LEFT: complete path response;  0/3 sentinel nodes    02/10/2015 Pathologic Stage   ypT0 ypN0 ypM0    02/22/2015 -  Anti-estrogen oral therapy   Zoladex with tamoxifen, stopped Zoladex for intolerance, decreased tamoxifen to 10 mg daily.   11/17/2015 Survivorship   Survivorship care plan completed and given to patient     Relapse/Recurrence   Within the upper-outer left breast anterior depth there are 2 adjacent irregular enhancing masses (2.1 cm and 1.5 cm) compatible with biopsy-proven malignancy. Indeterminate enhancing mass within the outer left breast posterior depth (1.8 cm). Indeterminate enhancing mass within the outer lower posterior right breast. 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 ER 80%, PR 0%, Her 2 Positive   08/12/2020 Cancer Staging   Staging form: Breast, AJCC 8th Edition - Pathologic stage from 08/12/2020: Stage IA (pT1b, pN0, cM0, G2, ER+, PR-, HER2+) - Signed by Gardenia Phlegm, NP on 08/24/2020  Stage prefix: Initial diagnosis  Histologic grading system: 3 grade system    09/07/2020 -  Chemotherapy    Patient is on Treatment Plan: BREAST ADO-TRASTUZUMAB EMTANSINE [facility]) Q21D       11/29/2020 Surgery   Left anterior lumpectomy: Benign, left posterior lumpectomy: Benign, left posterior tissue lateral margin excision: Benign with 3 intramammary lymph nodes negative for malignancy.  Left posterior tissue superior margin: Benign     CURRENT THERAPY:  Kadcyla/Zoladex/ Letrozole  INTERVAL HISTORY: Natasha Torres 40 y.o. female returns for evlauation prior to receiving every 3 week Kadcyla.  She is doing well today.    Her most recent echo was completed on 11/25/2020 and showed an EF of 61%.  She is tolerating the Kadcyla moderately well.  She notes some nausea after receiving it and is requesting to have the IV  Zofran added back to her premedication list.  She notes some tiredness the first 5 days after receiving her treatment.  Overall she is feeling well.  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    She is receiving Zoladex every 4 weeks with good tolerance and is taking Letrozole daily without any issues thus far.     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) 09/14/2014    is allergic to tramadol, buprenorphine hcl, morphine and related, other, penicillins, and tegaderm ag mesh [silver].  MEDICAL HISTORY: Past Medical History:  Diagnosis Date   Anemia    takes iron supplement   Anxiety    Family history of adverse reaction to anesthesia    pt's mother has hx. of post-op N/V   History of breast cancer 2016   left breast ca 08/2014   Recurrent breast cancer, left ([place]) dx'd 07/2020   Tattoo of skin 06/07/2016   new tattoo right ankle    SURGICAL HISTORY: Past Surgical History:  Procedure Laterality Date   BREAST LUMPECTOMY Left 08/2014   BREAST RECONSTRUCTION WITH PLACEMENT OF TISSUE EXPANDER AND FLEX HD (ACELLULAR HYDRATED DERMIS) Bilateral 02/10/2015   Procedure: BREAST RECONSTRUCTION WITH PLACEMENT OF TISSUE EXPANDER AND FLEX HD (ACELLULAR HYDRATED DERMIS);  Surgeon: Irene Limbo, MD;  Location: [place];  Service: Plastics;  Laterality: Bilateral;   INCISION AND DRAINAGE OF WOUND Left 03/02/2015   Procedure: IRRIGATION BREAST POCKET AND EXCHANGE OF LEFT BREAST TISSUE EXPANDER;  Surgeon: Irene Limbo, MD;  Location: [employer] [facility];  Service: Plastics;   Laterality: Left;   LIPOSUCTION WITH LIPOFILLING Bilateral 06/07/2015   Procedure: LIPOSUCTION WITH LIPOFILLING TO BILATERAL CHEST;  Surgeon: Irene Limbo, MD;  Location: [place];  Service: Plastics;  Laterality: Bilateral;   LIPOSUCTION WITH LIPOFILLING Bilateral 09/23/2015   Procedure: LIPOFILLING FROM BILATERAL THIGHS TO BILATERAL CHEST ;  Surgeon: Irene Limbo, MD;  Location: [place];  Service: Plastics;  Laterality: Bilateral;   LIPOSUCTION WITH LIPOFILLING Bilateral 06/15/2016   Procedure: LIPOFILLING FROM BILATERAL THIGH TO BILATERAL CHEST;  Surgeon: Irene Limbo, MD;  Location: [place];  Service: Plastics;  Laterality: Bilateral;  LIPOFILLING FROM BILATERAL THIGH TO BILATERAL CHEST   MASTECTOMY Bilateral 02/10/2015   NIPPLE SPARING MASTECTOMY/SENTINAL LYMPH NODE BIOPSY/RECONSTRUCTION/PLACEMENT OF TISSUE EXPANDER Bilateral 02/10/2015   Procedure: BILATERAL  NIPPLE SPARING MASTECTOMY WITH LEFT  SENTINAL LYMPH NODE BIOPSY(RIGHT BREAST PROPHYLACTIC);  Surgeon: Rolm Bookbinder, MD;  Location[facility] OR;  Service: General;  Laterality: Bilateral;   ORIF FINGER / THUMB FRACTURE Right 1994   thumb   PORT-A-CATH REMOVAL  01/07/2015   removed and replaced   PORT-A-CATH  REMOVAL Right 09/23/2015   Procedure: REMOVAL PORT-A-CATH;  Surgeon: Irene Limbo, MD;  Location: [place];  Service: Plastics;  Laterality: Right;   PORTACATH PLACEMENT  08/2014; 01/07/2015   PORTACATH PLACEMENT Right 09/06/2020   Procedure: INSERTION PORT-A-CATH;  Surgeon: Rolm Bookbinder, MD;  Location: [facility];  Service: General;  Laterality: Right;   RADIOACTIVE SEED GUIDED EXCISIONAL BREAST BIOPSY Left 11/29/2020   Procedure: RADIOACTIVE SEED GUIDED EXCISION OF LEFT BREAST CANCER x2;  Surgeon: Rolm Bookbinder, MD;  Location: [place];  Service: General;  Laterality: Left;   REMOVAL OF BILATERAL TISSUE EXPANDERS WITH PLACEMENT OF  BILATERAL BREAST IMPLANTS Bilateral 06/07/2015   Procedure: REMOVAL OF BILATERAL TISSUE EXPANDERS WITH PLACEMENT OF BILATERAL BREAST  SILICONE IMPLANTS;  Surgeon: Irene Limbo, MD;  Location: [place];  Service: Plastics;  Laterality: Bilateral;   REMOVAL OF TISSUE EXPANDER AND PLACEMENT OF IMPLANT Right 02/27/2015   Procedure: REMOVAL OF TISSUE EXPANDER AND PLACEMENT OF NEW TISSUE EXPANDER;  Surgeon: Irene Limbo, MD;  Location: [place];  Service: Plastics;  Laterality: Right;   SALPINGOOPHORECTOMY Right 11/22/2010   TISSUE EXPANDER PLACEMENT Left 03/02/2015   Procedure: TISSUE EXPANDER;  Surgeon: Irene Limbo, MD;  Location: [place];  Service: Plastics;  Laterality: Left;   WISDOM TOOTH EXTRACTION  2001    SOCIAL HISTORY: Social History   Socioeconomic History   Marital status: Divorced    Spouse name: Harrell Gave   Number of children: 1   Years of education: Not on file   Highest education level: Not on file  Occupational History   Not on file  Tobacco Use   Smoking status: Never   Smokeless tobacco: Never  Vaping Use   Vaping Use: Never used  Substance and Sexual Activity   Alcohol use: Yes    Alcohol/week: 0.0 standard drinks    Comment: Occasional   Drug use: No   Sexual activity: Not Currently  Other Topics Concern   Not on file  Social History Narrative   ** Merged History Encounter **       Social Determinants of Health   Financial Resource Strain: Not on file  Food Insecurity: Not on file  Transportation Needs: Not on file  Physical Activity: Not on file  Stress: Not on file  Social Connections: Not on file  Intimate Partner Violence: Not on file    FAMILY HISTORY: Family History  Problem Relation Age of Onset   Hypertension Mother    Anesthesia problems Mother        post-op N/V   Prostate cancer Father 39   Depression Maternal Aunt    Dementia Maternal Grandmother    Cancer Maternal Grandfather    Heart attack Maternal  Grandfather    Dementia Paternal Grandmother    Kidney cancer Paternal Grandmother        slow growing, no treatment   Cancer Paternal Grandfather    Prostate cancer Paternal Grandfather 66   Bone cancer Paternal Grandfather 33   Breast cancer Paternal Grandfather 67   Lung cancer Paternal Grandfather        dx late 8s; smoker.  thought to be a 4th primary cancer    Review of Systems  Constitutional:  Positive for fatigue. Negative for appetite change, chills, fever and unexpected weight change.  HENT:   Negative for hearing loss, lump/mass, mouth sores and trouble swallowing.        + dry mouth  Eyes:  Negative for eye problems and icterus.  Respiratory:  Negative for chest tightness, cough and shortness of breath.   Cardiovascular:  Negative for chest pain, leg swelling and palpitations.  Gastrointestinal:  Negative for abdominal distention, abdominal pain, constipation, diarrhea, nausea and vomiting.  Endocrine: Negative for hot flashes.  Genitourinary:  Negative for difficulty urinating.   Musculoskeletal:  Negative for arthralgias.  Skin:  Negative for itching and rash.  Neurological:  Negative for dizziness, extremity weakness, headaches and numbness.  Hematological:  Negative for adenopathy. Does not bruise/bleed easily.  Psychiatric/Behavioral:  Negative for depression. The patient is not nervous/anxious.      PHYSICAL EXAMINATION  ECOG PERFORMANCE STATUS: 1 - Symptomatic but completely ambulatory  Vitals:   01/20/21 1429  BP: 114/65  Pulse: 62  Resp: 18  Temp: (!) 97.2 F (36.2 C)  SpO2: 100%    Physical Exam Constitutional:      General: She is not in acute distress.    Appearance: Normal appearance. She is not toxic-appearing.  HENT:     Head: Normocephalic and atraumatic.  Eyes:     General: No scleral icterus. Cardiovascular:     Rate and Rhythm: Normal rate and regular rhythm.     Pulses: Normal pulses.     Heart sounds: Normal heart sounds.   Pulmonary:     Effort: Pulmonary effort is normal.     Breath sounds: Normal breath sounds.  Abdominal:     General: Abdomen is flat. Bowel sounds are normal. There is no distension.     Palpations: Abdomen is soft.     Tenderness: There is no abdominal tenderness.  Musculoskeletal:        General: No swelling.     Cervical back: Neck supple.  Lymphadenopathy:     Cervical: No cervical adenopathy.  Skin:    General: Skin is warm and dry.     Findings: No rash.  Neurological:     General: No focal deficit present.     Mental Status: She is alert.  Psychiatric:        Mood and Affect: Mood normal.        Behavior: Behavior normal.    LABORATORY DATA:  CBC    Component Value Date/Time   WBC 5.9 01/20/2021 1422   WBC 3.5 (L) 09/01/2020 0817   RBC 3.59 (L) 01/20/2021 1422   HGB 10.6 (L) 01/20/2021 1422   HGB 12.4 11/20/2016 0936   HCT 31.3 (L) 01/20/2021 1422   HCT 37.4 11/20/2016 0936   PLT 155 01/20/2021 1422   PLT 172 11/20/2016 0936   MCV 87.2 01/20/2021 1422   MCV 91.5 11/20/2016 0936   MCH 29.5 01/20/2021 1422   MCHC 33.9 01/20/2021 1422   RDW 12.6 01/20/2021 1422   RDW 13.1 11/20/2016 0936   LYMPHSABS 2.1 01/20/2021 1422   LYMPHSABS 1.6 11/20/2016 0936   MONOABS 0.4 01/20/2021 1422   MONOABS 0.5 11/20/2016 0936   EOSABS 0.1 01/20/2021 1422   EOSABS 0.1 11/20/2016 0936   BASOSABS 0.1 01/20/2021 1422   BASOSABS 0.0 11/20/2016 0936    CMP     Component Value Date/Time   NA 140 01/20/2021 1422   NA 140 11/20/2016 0936   K 3.7 01/20/2021 1422   K 4.3 11/20/2016 0936   CL 106 01/20/2021 1422   CO2 26 01/20/2021 1422   CO2 24 11/20/2016 0936   GLUCOSE 114 (H) 01/20/2021 1422   GLUCOSE 84 11/20/2016 0936   BUN 6 01/20/2021 1422   BUN 11.8 11/20/2016 0936  CREATININE 0.69 01/20/2021 1422   CREATININE 0.8 11/20/2016 0936   CALCIUM 9.4 01/20/2021 1422   CALCIUM 9.6 11/20/2016 0936   PROT 6.7 01/20/2021 1422   PROT 6.9 11/20/2016 0936   ALBUMIN 3.7  01/20/2021 1422   ALBUMIN 3.9 11/20/2016 0936   AST 119 (H) 01/20/2021 1422   AST 29 11/20/2016 0936   ALT 69 (H) 01/20/2021 1422   ALT 15 11/20/2016 0936   ALKPHOS 77 01/20/2021 1422   ALKPHOS 51 11/20/2016 0936   BILITOT 0.3 01/20/2021 1422   BILITOT 0.44 11/20/2016 0936   GFRNONAA >60 01/20/2021 1422   GFRAA >60 02/27/2015 1135        ASSESSMENT and THERAPY PLAN:   Breast cancer of upper-outer quadrant of left female breast 08/13/2014: Left breast IDC. Neo adj chemo with TCHP foll by Herceptin maintenance (path CR: Bil mastectomies and reconstruction), Tamoxifen (stoped and started due to intolerance) ATM gene mutation   08/12/20 Relapsed Breast Cancer: Left Breast UOQ 2.1 cm and 1.5 cm. Indeterminate 1.8 cm and Right breast indeterminate 1.5 cm.  Biopsy revealed IDC with DCIS, ER 80%, PR 0%, HER-2 3+ IHC positive   Bone scans 08/22/2020: Results awaited CT scans: scheduled for 08/24/20 Left breast Biopsy 3 o clock:Grade 2-3 IMC with lobular features ER 60%, PR 10%, Ki-67 20%, HER-2 3+ Rt Breast Biopsy: Benign   Treatment plan: 1.  chemotherapy with Kadcyla for currently unresectable breast cancer 4 cycles completed 11/10/2020 2. Surgery 11/29/2020: Pathologic complete response 3. Followed by XRT (have to discuss the pros and cons of radiation given ATM mutation) 4. Further adjuvant antiestrogen therapy (ovarian suppression or oophorectomy plus Letrozole) Adding Zoladex monthly to her treatment plan ------------------------------------------------------------------ Treatment plan: Continue maintenance Kadcyla for 1 year. Antiestrogen therapy with Zoladex with Letrozole Echo on 6/10: EF 61%  She is tolerating Kadyla relatively well 1. Fatigue: worse first 5 days after treatment 2. Nausea: IV Zofran prior to Kadcyla 3. Echo repeat due in September, ordered today  She will continue Zoladex and Letrozole, thus far she is tolerating them well.    Return to clinic every 3 weeks  for Kadcyla.  All questions were answered. The patient knows to call the clinic with any problems, questions or concerns. We can certainly see the patient much sooner if necessary.  Total encounter time: 20 minutes in face to face visit time, chart review, lab review, order entry, care coordination and documentation of the encounter.   Wilber Bihari, NP 01/20/21 3:11 PM Medical Oncology and Hematology [facility] [place], [place] [address] Tel. [phone]    Fax. [phone]  *Total Encounter Time as defined by the Centers for Medicare and Medicaid Services includes, in addition to the face-to-face time of a patient visit (documented in the note above) non-face-to-face time: obtaining and reviewing outside history, ordering and reviewing medications, tests or procedures, care coordination (communications with other health care professionals or caregivers) and documentation in the medical record.

## 2021-01-20 NOTE — Assessment & Plan Note (Addendum)
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 Surgery 11/29/2020: Pathologic complete response 3. Followed by Jeanie Cooks to discuss the pros and cons of radiation given ATM mutation) 4.Further adjuvant antiestrogen therapy (ovarian suppression or oophorectomy plus Letrozole) Adding Zoladex monthly to her treatment plan ------------------------------------------------------------------ Treatment plan: Continue maintenance Kadcyla for 1 year. Antiestrogen therapy with Zoladex with Letrozole Echo on 6/10: EF 61%  She is tolerating Kadyla relatively well 1. Fatigue: worse first 5 days after treatment 2. Nausea: IV Zofran prior to Kadcyla 3. Echo repeat due in September, ordered today  She will continue Zoladex and Letrozole, thus far she is tolerating them well.    Return to clinic every 3 weeks for Kadcyla.

## 2021-01-31 ENCOUNTER — Encounter: Payer: Self-pay | Admitting: Hematology and Oncology

## 2021-02-03 ENCOUNTER — Inpatient Hospital Stay: Payer: BC Managed Care – PPO

## 2021-02-03 ENCOUNTER — Other Ambulatory Visit: Payer: Self-pay

## 2021-02-03 VITALS — BP 114/70 | HR 55 | Temp 98.0°F | Resp 15

## 2021-02-03 DIAGNOSIS — Z170 Estrogen receptor positive status [ER+]: Secondary | ICD-10-CM

## 2021-02-03 DIAGNOSIS — C50412 Malignant neoplasm of upper-outer quadrant of left female breast: Secondary | ICD-10-CM

## 2021-02-03 MED ORDER — GOSERELIN ACETATE 3.6 MG ~~LOC~~ IMPL
3.6000 mg | DRUG_IMPLANT | Freq: Once | SUBCUTANEOUS | Status: AC
  Administered 2021-02-03: 3.6 mg via SUBCUTANEOUS
  Filled 2021-02-03: qty 3.6

## 2021-02-05 ENCOUNTER — Other Ambulatory Visit: Payer: Self-pay | Admitting: Hematology and Oncology

## 2021-02-06 ENCOUNTER — Other Ambulatory Visit: Payer: Self-pay | Admitting: *Deleted

## 2021-02-06 DIAGNOSIS — C50412 Malignant neoplasm of upper-outer quadrant of left female breast: Secondary | ICD-10-CM

## 2021-02-06 DIAGNOSIS — Z170 Estrogen receptor positive status [ER+]: Secondary | ICD-10-CM

## 2021-02-06 MED ORDER — LIDOCAINE-PRILOCAINE 2.5-2.5 % EX CREA: TOPICAL_CREAM | CUTANEOUS | 3 refills | Status: DC

## 2021-02-07 ENCOUNTER — Other Ambulatory Visit: Payer: Self-pay | Admitting: Hematology and Oncology

## 2021-02-07 MED ORDER — LORAZEPAM 0.5 MG PO TABS: 0.5000 mg | ORAL_TABLET | Freq: Four times a day (QID) | ORAL | 3 refills | Status: DC | PRN

## 2021-02-08 NOTE — Progress Notes (Signed)
Patient Care Team: [name], MD as PCP - General (Internal Medicine) Nicholas Lose, MD as Consulting Physician (Hematology and Oncology) Rolm Bookbinder, MD as Consulting Physician (General Surgery) Irene Limbo, MD as Consulting Physician (Plastic Surgery) Rockwell Germany, RN as Registered Nurse Mauro Kaufmann, RN as Registered Nurse Sylvan Cheese, NP as Nurse Practitioner (Hematology and Oncology) Paula Compton, MD as Consulting Physician (Obstetrics and Gynecology)  DIAGNOSIS:    ICD-10-CM   1. Malignant neoplasm of upper-outer quadrant of left breast in female, estrogen receptor positive ([place])  C50.412 DISCONTINUED: ado-trastuzumab emtansine (KADCYLA) 220 mg in sodium chloride 0.9 % 250 mL chemo infusion   Z17.0       SUMMARY OF ONCOLOGIC HISTORY: Oncology History  Breast cancer of upper-outer quadrant of left female breast ([place])  08/13/2014 Mammogram   Left breast: 2 cm circumscribed mass   08/13/2014 Breast US   Left breast: two masses: #1: 1:00 10 x 9 x 13 mmm irregular; #2: 2:00: 16 x 7 x 14 mm; no LAD   08/27/2014 Initial Diagnosis   O/S excisional biopsy: 2 masses showing 1.5 cm and 0.9 cm IDC, Grade 3, ER + (90%), PR- (0%), HER-2 positive (ratio 2.5), Ki67 ~30%, Multifocal   08/27/2014 Clinical Stage   Stage IIA/IIB: T2/T3 N0   09/17/2014 Procedure   MyRisk panel ([employer]) 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 Otherwise negative at APC, ATM, BARD1, BMPR1A, BRCA1, BRCA2, BRIP1, CHD1, CDK4, CDKN2A, CHEK2, MLH1, MSH2, MSH6, MUTYH, NBN, PALB2, PMS2, PTEN, RAD51C, RAD51D, SMAD4, STK11, and TP53   09/20/2014 Breast MRI   RIGHT: 1 x 0.8 x 0.9 cm lobulated border mass in the retroareolar lower slight lateral area. LEFT: hematoma with surrounding adjacent enhancement encompassing a 4.6 x 4.9 x 4.1 cm area.    09/27/2014 - 01/10/2015 Neo-Adjuvant Chemotherapy   Neoadjuvant TCH [place] every 3 week 6 followed by Herceptin maintenance completed  09/21/2015   01/13/2015 Breast MRI   Postsurgical changes in left breast without residual enhancing masses compatible with treatment response   02/10/2015 Definitive Surgery   Bilateral mastectomies: RIGHT: benign.  LEFT: complete path response;  0/3 sentinel nodes   02/10/2015 Pathologic Stage   ypT0 ypN0 ypM0   02/22/2015 -  Anti-estrogen oral therapy   Zoladex with tamoxifen, stopped Zoladex for intolerance, decreased tamoxifen to 10 mg daily.   11/17/2015 Survivorship   Survivorship care plan completed and given to patient    Relapse/Recurrence   Within the upper-outer left breast anterior depth there are 2 adjacent irregular enhancing masses (2.1 cm and 1.5 cm) compatible with biopsy-proven malignancy. Indeterminate enhancing mass within the outer left breast posterior depth (1.8 cm). Indeterminate enhancing mass within the outer lower posterior right breast. 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 ER 80%, PR 0%, Her 2 Positive   08/12/2020 Cancer Staging   Staging form: Breast, AJCC 8th Edition - Pathologic stage from 08/12/2020: Stage IA (pT1b, pN0, cM0, G2, ER+, PR-, HER2+) - Signed by Gardenia Phlegm, NP on 08/24/2020 Stage prefix: Initial diagnosis Histologic grading system: 3 grade system   09/07/2020 -  Chemotherapy    Patient is on Treatment Plan: BREAST ADO-TRASTUZUMAB EMTANSINE [facility]) Q21D       11/29/2020 Surgery   Left anterior lumpectomy: Benign, left posterior lumpectomy: Benign, left posterior tissue lateral margin excision: Benign with 3 intramammary lymph nodes negative for malignancy.  Left posterior tissue superior margin: Benign     CHIEF  COMPLIANT: Cycle 8 Kadcyla  INTERVAL HISTORY: Natasha Torres is a 40 y.o. with above-mentioned history of recurrent left breast cancer currently on chemotherapy with Kadcyla. She presents to the clinic today for treatment.  She is complaining of profound fatigue as  result of Kadcyla.  She was noted to have markedly elevated LFTs and therefore her last treatment was held and she is here for her next treatment as well.  Since her treatment was held she is feeling a lot better today.  She is very anxious about side effects of Kadcyla.  ALLERGIES:  is allergic to tramadol, buprenorphine hcl, morphine and related, other, penicillins, and tegaderm ag mesh [silver].  MEDICATIONS:  Current Outpatient Medications  Medication Sig Dispense Refill   Ashwagandha 500 MG CAPS Take by mouth.     Cholecalciferol (VITAMIN D3) 50 MCG (2000 UT) TABS Take 2,000 Units by mouth at bedtime.     Cyanocobalamin (B-12) 100 MCG TABS Take by mouth.     Ferrous Sulfate (IRON) 325 (65 Fe) MG TABS Take by mouth.     folic acid (FOLVITE) 809 MCG tablet Take 400 mcg by mouth daily.     letrozole (FEMARA) 2.5 MG tablet Take 1 tablet (2.5 mg total) by mouth daily. 90 tablet 3   lidocaine-prilocaine (EMLA) cream Apply to affected area once 30 g 3   LORazepam (ATIVAN) 0.5 MG tablet Take 1 tablet (0.5 mg total) by mouth every 6 (six) hours as needed for anxiety. 30 tablet 3   Magnesium Hydroxide 400 MG CHEW Chew 400 mg by mouth at bedtime.     Melatonin 10 MG TABS Take by mouth.     Multiple Vitamin (AMLADEX) TABS Take by mouth.     Multiple Vitamins-Minerals (ADULT ONE DAILY GUMMIES PO) Take 2 tablets by mouth at bedtime.     ondansetron (ZOFRAN) 8 MG tablet Take 1 tablet (8 mg total) by mouth every 8 (eight) hours as needed for nausea or vomiting. 30 tablet 1   prochlorperazine (COMPAZINE) 10 MG tablet Take 1 tablet (10 mg total) by mouth every 6 (six) hours as needed for nausea or vomiting. 30 tablet 1   Turmeric 500 MG CAPS Take 500 mg by mouth at bedtime.     No current facility-administered medications for this visit.   Facility-Administered Medications Ordered in Other Visits  Medication Dose Route Frequency Provider Last Rate Last Admin   ado-trastuzumab emtansine (KADCYLA) 220 mg  in sodium chloride 0.9 % 250 mL chemo infusion  3 mg/kg (Treatment Plan Recorded) Intravenous Once Nicholas Lose, MD 522 mL/hr at 02/09/21 1653 220 mg at 02/09/21 1653   heparin lock flush 100 unit/mL  500 Units Intracatheter Once PRN Nicholas Lose, MD       sodium chloride flush (NS) 0.9 % injection 10 mL  10 mL Intracatheter PRN Nicholas Lose, MD        PHYSICAL EXAMINATION: ECOG PERFORMANCE STATUS: 1 - Symptomatic but completely ambulatory  Vitals:   02/09/21 1445  BP: 119/66  Pulse: 74  Resp: 18  Temp: 97.9 F (36.6 C)  SpO2: 100%   Filed Weights   02/09/21 1445  Weight: 161 lb 4.8 oz (73.2 kg)    LABORATORY DATA:  I have reviewed the data as listed CMP Latest Ref Rng & Units 02/09/2021 01/20/2021 12/29/2020  Glucose 70 - 99 mg/dL 101(H) 114(H) 87  BUN 6 - 20 mg/dL 7 6 6   Creatinine 0.44 - 1.00 mg/dL 0.73 0.69 0.69  Sodium 135 - 145  mmol/L 137 140 141  Potassium 3.5 - 5.1 mmol/L 3.7 3.7 3.6  Chloride 98 - 111 mmol/L 104 106 105  CO2 22 - 32 mmol/L 26 26 28   Calcium 8.9 - 10.3 mg/dL 9.2 9.4 9.7  Total Protein 6.5 - 8.1 g/dL 6.9 6.7 7.3  Total Bilirubin 0.3 - 1.2 mg/dL 0.4 0.3 0.3  Alkaline Phos 38 - 126 U/L 76 77 86  AST 15 - 41 U/L 47(H) 119(H) 46(H)  ALT 0 - 44 U/L 40 69(H) 54(H)    Lab Results  Component Value Date   WBC 4.8 02/09/2021   HGB 10.8 (L) 02/09/2021   HCT 32.7 (L) 02/09/2021   MCV 89.6 02/09/2021   PLT 160 02/09/2021   NEUTROABS 2.3 02/09/2021    ASSESSMENT & PLAN:  Breast cancer of upper-outer quadrant of left female breast 08/13/2014: Left breast IDC. Neo adj chemo with TCHP foll by Herceptin maintenance (path CR: Bil mastectomies and reconstruction), Tamoxifen (stoped and started due to intolerance) ATM gene mutation   08/12/20 Relapsed Breast Cancer: Left Breast UOQ 2.1 cm and 1.5 cm. Indeterminate 1.8 cm and Right breast indeterminate 1.5 cm.  Biopsy revealed IDC with DCIS, ER 80%, PR 0%, HER-2 3+ IHC positive   Bone scans 08/22/2020: Results  awaited CT scans: scheduled for 08/24/20 Left breast Biopsy 3 o clock:Grade 2-3 IMC with lobular features ER 60%, PR 10%, Ki-67 20%, HER-2 3+ Rt Breast Biopsy: Benign   Treatment plan: 1.  chemotherapy with Kadcyla for currently unresectable breast cancer 4 cycles completed 11/10/2020 2. Surgery 11/29/2020: Pathologic complete response 3. Followed by XRT (have to discuss the pros and cons of radiation given ATM mutation) 4. Further adjuvant antiestrogen therapy (ovarian suppression or oophorectomy plus Letrozole) Adding Zoladex monthly to her treatment plan ------------------------------------------------------------------ Treatment plan: Continue maintenance Kadcyla for 1 year. Antiestrogen therapy with Zoladex with Letrozole Echo on 6/10: EF 61%   Kadcyla toxicities: 1. Fatigue: worse first 5 days after treatment 2. Nausea: IV Zofran prior to Kadcyla 3. Echo repeat due in September  4.  Elevated LFTs: They have come down when we held her Kadcyla last treatment.  I reduce the dose of Kadcyla today.   She will continue Zoladex and Letrozole, thus far she is tolerating them well.     Return to clinic every 3 weeks for Kadcyla.    No orders of the defined types were placed in this encounter.  The patient has a good understanding of the overall plan. she agrees with it. she will call with any problems that may develop before the next visit here.  Total time spent: 30 mins including face to face time and time spent for planning, charting and coordination of care  Rulon Eisenmenger, MD, MPH 02/09/2021  I, Thana Ates, am acting as scribe for Dr. Nicholas Lose.  I have reviewed the above documentation for accuracy and completeness, and I agree with the above.

## 2021-02-08 NOTE — Telephone Encounter (Signed)
Refilled yesterday by Dr. Lindi Adie.Gardiner Rhyme, RN

## 2021-02-09 ENCOUNTER — Inpatient Hospital Stay (HOSPITAL_BASED_OUTPATIENT_CLINIC_OR_DEPARTMENT_OTHER): Payer: BC Managed Care – PPO | Admitting: Hematology and Oncology

## 2021-02-09 ENCOUNTER — Other Ambulatory Visit: Payer: Self-pay

## 2021-02-09 ENCOUNTER — Inpatient Hospital Stay: Payer: BC Managed Care – PPO

## 2021-02-09 ENCOUNTER — Other Ambulatory Visit: Payer: BC Managed Care – PPO

## 2021-02-09 DIAGNOSIS — C50412 Malignant neoplasm of upper-outer quadrant of left female breast: Secondary | ICD-10-CM

## 2021-02-09 DIAGNOSIS — Z170 Estrogen receptor positive status [ER+]: Secondary | ICD-10-CM

## 2021-02-09 DIAGNOSIS — Z95828 Presence of other vascular implants and grafts: Secondary | ICD-10-CM

## 2021-02-09 LAB — CMP (CANCER CENTER ONLY)
ALT: 40 U/L (ref 0–44)
AST: 47 U/L — ABNORMAL HIGH (ref 15–41)
Albumin: 3.9 g/dL (ref 3.5–5.0)
Alkaline Phosphatase: 76 U/L (ref 38–126)
Anion gap: 7 (ref 5–15)
BUN: 7 mg/dL (ref 6–20)
CO2: 26 mmol/L (ref 22–32)
Calcium: 9.2 mg/dL (ref 8.9–10.3)
Chloride: 104 mmol/L (ref 98–111)
Creatinine: 0.73 mg/dL (ref 0.44–1.00)
GFR, Estimated: >60 mL/min (ref >60)
Glucose, Bld: 101 mg/dL — ABNORMAL HIGH (ref 70–99)
Potassium: 3.7 mmol/L (ref 3.5–5.1)
Sodium: 137 mmol/L (ref 135–145)
Total Bilirubin: 0.4 mg/dL (ref 0.3–1.2)
Total Protein: 6.9 g/dL (ref 6.5–8.1)

## 2021-02-09 LAB — CBC WITH DIFFERENTIAL (CANCER CENTER ONLY)
Abs Immature Granulocytes: 0 10*3/uL (ref 0.00–0.07)
Basophils Absolute: 0.1 10*3/uL (ref 0.0–0.1)
Basophils Relative: 1 %
Eosinophils Absolute: 0.1 10*3/uL (ref 0.0–0.5)
Eosinophils Relative: 1 %
HCT: 32.7 % — ABNORMAL LOW (ref 36.0–46.0)
Hemoglobin: 10.8 g/dL — ABNORMAL LOW (ref 12.0–15.0)
Immature Granulocytes: 0 %
Lymphocytes Relative: 42 %
Lymphs Abs: 2 10*3/uL (ref 0.7–4.0)
MCH: 29.6 pg (ref 26.0–34.0)
MCHC: 33 g/dL (ref 30.0–36.0)
MCV: 89.6 fL (ref 80.0–100.0)
Monocytes Absolute: 0.4 10*3/uL (ref 0.1–1.0)
Monocytes Relative: 8 %
Neutro Abs: 2.3 10*3/uL (ref 1.7–7.7)
Neutrophils Relative %: 48 %
Platelet Count: 160 10*3/uL (ref 150–400)
RBC: 3.65 MIL/uL — ABNORMAL LOW (ref 3.87–5.11)
RDW: 12.6 % (ref 11.5–15.5)
WBC Count: 4.8 10*3/uL (ref 4.0–10.5)
nRBC: 0 % (ref 0.0–0.2)

## 2021-02-09 MED ORDER — ONDANSETRON HCL 4 MG/2ML IJ SOLN
8.0000 mg | Freq: Once | INTRAMUSCULAR | Status: AC
  Administered 2021-02-09: 8 mg via INTRAVENOUS
  Filled 2021-02-09: qty 4

## 2021-02-09 MED ORDER — SODIUM CHLORIDE 0.9% FLUSH
10.0000 mL | INTRAVENOUS | Status: AC | PRN
  Administered 2021-02-09: 10 mL

## 2021-02-09 MED ORDER — SODIUM CHLORIDE 0.9 % IV SOLN: 8.0000 mg | Freq: Once | INTRAVENOUS | Status: DC

## 2021-02-09 MED ORDER — SODIUM CHLORIDE 0.9 % IV SOLN: Freq: Once | INTRAVENOUS | Status: AC

## 2021-02-09 MED ORDER — SODIUM CHLORIDE 0.9% FLUSH
10.0000 mL | INTRAVENOUS | Status: DC | PRN
  Administered 2021-02-09: 10 mL

## 2021-02-09 MED ORDER — HEPARIN SOD (PORK) LOCK FLUSH 100 UNIT/ML IV SOLN
500.0000 [IU] | Freq: Once | INTRAVENOUS | Status: AC | PRN
  Administered 2021-02-09: 500 [IU]

## 2021-02-09 MED ORDER — ACETAMINOPHEN 325 MG PO TABS
650.0000 mg | ORAL_TABLET | Freq: Once | ORAL | Status: AC
  Administered 2021-02-09: 650 mg via ORAL
  Filled 2021-02-09: qty 2

## 2021-02-09 MED ORDER — FAMOTIDINE 20 MG IN NS 100 ML IVPB
20.0000 mg | Freq: Once | INTRAVENOUS | Status: AC
  Administered 2021-02-09: 20 mg via INTRAVENOUS
  Filled 2021-02-09: qty 100

## 2021-02-09 MED ORDER — SODIUM CHLORIDE 0.9 % IV SOLN
3.0000 mg/kg | Freq: Once | INTRAVENOUS | Status: AC
  Administered 2021-02-09: 220 mg via INTRAVENOUS
  Filled 2021-02-09: qty 8

## 2021-02-09 NOTE — Patient Instructions (Signed)
[facility] MED[facility]COL[facility]  Discharge Instructions: °Thank you for choosing [facility] to provide your oncology and hematology care.  ° °If you have a lab appointment with the Cancer Center, please go directly to the Cancer Center and check in at the registration area. °  °Wear comfortable clothing and clothing appropriate for easy access to any Portacath or PICC line.  ° °We strive to give you quality time with your provider. You may need to reschedule your appointment if you arrive late (15 or more minutes).  Arriving late affects you and other patients whose appointments are after yours.  Also, if you miss three or more appointments without notifying the office, you may be dismissed from the clinic at the provider’s discretion.    °  °For prescription refill requests, have your pharmacy contact our office and allow 72 hours for refills to be completed.   ° °Today you received the following chemotherapy and/or immunotherapy agents Kadcyla    °  °To help prevent nausea and vomiting after your treatment, we encourage you to take your nausea medication as directed. ° °BELOW ARE SYMPTOMS THAT SHOULD BE REPORTED IMMEDIATELY: °*FEVER GREATER THAN 100.4 F (38 °C) OR HIGHER °*CHILLS OR SWEATING °*NAUSEA AND VOMITING THAT IS NOT CONTROLLED WITH YOUR NAUSEA MEDICATION °*UNUSUAL SHORTNESS OF BREATH °*UNUSUAL BRUISING OR BLEEDING °*URINARY PROBLEMS (pain or burning when urinating, or frequent urination) °*BOWEL PROBLEMS (unusual diarrhea, constipation, pain near the anus) °TENDERNESS IN MOUTH AND THROAT WITH OR WITHOUT PRESENCE OF ULCERS (sore throat, sores in mouth, or a toothache) °UNUSUAL RASH, SWELLING OR PAIN  °UNUSUAL VAGINAL DISCHARGE OR ITCHING  ° °Items with * indicate a potential emergency and should be followed up as soon as possible or go to the Emergency Department if any problems should occur. ° °Please show the CHEMOTHERAPY ALERT CARD or IMMUNOTHERAPY ALERT CARD at check-in to the  Emergency Department and triage nurse. ° °Should you have questions after your visit or need to cancel or reschedule your appointment, please contact [facility]  Dept: [phone]  and follow the prompts.  Office hours are 8:00 a.m. to 4:30 p.m. Monday - Friday. Please note that voicemails left after 4:00 p.m. may not be returned until the following business day.  We are closed weekends and major holidays. You have access to a nurse at all times for urgent questions. Please call the main number to the clinic Dept: [phone] and follow the prompts. ° ° °For any non-urgent questions, you may also contact your provider using MyChart. We now offer e-Visits for anyone 18 and older to request care online for non-urgent symptoms. For details visit [email or web address]. °  °Also download the MyChart app! Go to the app store, search "MyChart", open the app, select , and log in with your MyChart username and password. ° °Due to Covid, a mask is required upon entering the hospital/clinic. If you do not have a mask, one will be given to you upon arrival. For doctor visits, patients may have 1 support person aged 18 or older with them. For treatment visits, patients cannot have anyone with them due to current Covid guidelines and our immunocompromised population.  ° °

## 2021-02-09 NOTE — Assessment & Plan Note (Signed)
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 Surgery 11/29/2020: Pathologic complete response 3. Followed by Jeanie Cooks to discuss the pros and cons of radiation given ATM mutation) 4.Further adjuvant antiestrogen therapy (ovarian suppression or oophorectomy plus Letrozole) Adding Zoladex monthly to her treatment plan ------------------------------------------------------------------ Treatment plan: Continue maintenance Kadcyla for 1 year. Antiestrogen therapy with Zoladex with Letrozole Echo on 6/10: EF 61%  Kadcyla toxicities: 1. Fatigue: worse first 5 days after treatment 2. Nausea: IV Zofran prior to Kadcyla 3. Echo repeat due in September, ordered today  She will continue Zoladex and Letrozole, thus far she is tolerating them well.    Return to clinic every 3 weeks for Kadcyla.

## 2021-02-17 ENCOUNTER — Other Ambulatory Visit: Payer: Self-pay

## 2021-02-17 ENCOUNTER — Ambulatory Visit (HOSPITAL_COMMUNITY)
Admission: RE | Admit: 2021-02-17 | Discharge: 2021-02-17 | Disposition: A | Discharge status: Home / Self Care | Payer: BC Managed Care – PPO | Source: Ambulatory Visit | Attending: Adult Health | Admitting: Adult Health

## 2021-02-17 DIAGNOSIS — Z01818 Encounter for other preprocedural examination: Secondary | ICD-10-CM | POA: Diagnosis not present

## 2021-02-17 DIAGNOSIS — Z0189 Encounter for other specified special examinations: Secondary | ICD-10-CM

## 2021-02-17 DIAGNOSIS — C50412 Malignant neoplasm of upper-outer quadrant of left female breast: Secondary | ICD-10-CM | POA: Insufficient documentation

## 2021-02-17 DIAGNOSIS — Z170 Estrogen receptor positive status [ER+]: Secondary | ICD-10-CM | POA: Insufficient documentation

## 2021-02-17 LAB — ECHOCARDIOGRAM COMPLETE
AR max vel: 2.72 cm2
AV Area VTI: 2.68 cm2
AV Area mean vel: 2.62 cm2
AV Mean grad: 3 mmHg
AV Peak grad: 6.2 mmHg
Ao pk vel: 1.24 m/s
Area-P 1/2: 4.49 cm2
S' Lateral: 3.3 cm
Single Plane A4C EF: 68.1 %

## 2021-02-21 ENCOUNTER — Telehealth (HOSPITAL_COMMUNITY): Payer: Self-pay | Admitting: Internal Medicine

## 2021-02-21 ENCOUNTER — Other Ambulatory Visit: Payer: Self-pay | Admitting: *Deleted

## 2021-02-21 DIAGNOSIS — Z170 Estrogen receptor positive status [ER+]: Secondary | ICD-10-CM

## 2021-02-21 DIAGNOSIS — C50412 Malignant neoplasm of upper-outer quadrant of left female breast: Secondary | ICD-10-CM

## 2021-02-21 NOTE — Telephone Encounter (Signed)
Natasha Torres, from [facility] called to f/u w/referral, please advise

## 2021-02-21 NOTE — Progress Notes (Signed)
Per request of Wilber Bihari, NP referral placed to Dr. Haroldine Laws for evaluation and treatment of heart strain.  RN sent in basket to HIM to coordinate referral. Pt notified of referral and verbalized understanding.

## 2021-02-26 ENCOUNTER — Encounter: Payer: Self-pay | Admitting: Hematology and Oncology

## 2021-03-02 ENCOUNTER — Inpatient Hospital Stay (HOSPITAL_BASED_OUTPATIENT_CLINIC_OR_DEPARTMENT_OTHER): Payer: BC Managed Care – PPO | Admitting: Adult Health

## 2021-03-02 ENCOUNTER — Inpatient Hospital Stay: Payer: BC Managed Care – PPO | Attending: Hematology and Oncology

## 2021-03-02 ENCOUNTER — Encounter: Payer: Self-pay | Admitting: *Deleted

## 2021-03-02 ENCOUNTER — Inpatient Hospital Stay: Payer: BC Managed Care – PPO

## 2021-03-02 ENCOUNTER — Telehealth (HOSPITAL_COMMUNITY): Payer: Self-pay | Admitting: Vascular Surgery

## 2021-03-02 ENCOUNTER — Other Ambulatory Visit: Payer: Self-pay

## 2021-03-02 VITALS — BP 123/66 | HR 80 | Temp 97.7°F | Resp 20 | Wt 161.0 lb

## 2021-03-02 DIAGNOSIS — Z5112 Encounter for antineoplastic immunotherapy: Secondary | ICD-10-CM | POA: Insufficient documentation

## 2021-03-02 DIAGNOSIS — C50919 Malignant neoplasm of unspecified site of unspecified female breast: Secondary | ICD-10-CM

## 2021-03-02 DIAGNOSIS — C50412 Malignant neoplasm of upper-outer quadrant of left female breast: Secondary | ICD-10-CM

## 2021-03-02 DIAGNOSIS — Z8051 Family history of malignant neoplasm of kidney: Secondary | ICD-10-CM | POA: Diagnosis not present

## 2021-03-02 DIAGNOSIS — E559 Vitamin D deficiency, unspecified: Secondary | ICD-10-CM | POA: Diagnosis not present

## 2021-03-02 DIAGNOSIS — Z79818 Long term (current) use of other agents affecting estrogen receptors and estrogen levels: Secondary | ICD-10-CM | POA: Diagnosis not present

## 2021-03-02 DIAGNOSIS — Z170 Estrogen receptor positive status [ER+]: Secondary | ICD-10-CM

## 2021-03-02 DIAGNOSIS — D509 Iron deficiency anemia, unspecified: Secondary | ICD-10-CM | POA: Insufficient documentation

## 2021-03-02 DIAGNOSIS — Z95828 Presence of other vascular implants and grafts: Secondary | ICD-10-CM

## 2021-03-02 LAB — CBC WITH DIFFERENTIAL (CANCER CENTER ONLY)
Abs Immature Granulocytes: 0.02 10*3/uL (ref 0.00–0.07)
Basophils Absolute: 0.1 10*3/uL (ref 0.0–0.1)
Basophils Relative: 1 %
Eosinophils Absolute: 0.1 10*3/uL (ref 0.0–0.5)
Eosinophils Relative: 2 %
HCT: 35.1 % — ABNORMAL LOW (ref 36.0–46.0)
Hemoglobin: 11.6 g/dL — ABNORMAL LOW (ref 12.0–15.0)
Immature Granulocytes: 0 %
Lymphocytes Relative: 41 %
Lymphs Abs: 2.2 10*3/uL (ref 0.7–4.0)
MCH: 29.4 pg (ref 26.0–34.0)
MCHC: 33 g/dL (ref 30.0–36.0)
MCV: 88.9 fL (ref 80.0–100.0)
Monocytes Absolute: 0.5 10*3/uL (ref 0.1–1.0)
Monocytes Relative: 9 %
Neutro Abs: 2.5 10*3/uL (ref 1.7–7.7)
Neutrophils Relative %: 47 %
Platelet Count: 180 10*3/uL (ref 150–400)
RBC: 3.95 MIL/uL (ref 3.87–5.11)
RDW: 12.5 % (ref 11.5–15.5)
WBC Count: 5.3 10*3/uL (ref 4.0–10.5)
nRBC: 0 % (ref 0.0–0.2)

## 2021-03-02 LAB — CMP (CANCER CENTER ONLY)
ALT: 29 U/L (ref 0–44)
AST: 41 U/L (ref 15–41)
Albumin: 4.2 g/dL (ref 3.5–5.0)
Alkaline Phosphatase: 91 U/L (ref 38–126)
Anion gap: 10 (ref 5–15)
BUN: 7 mg/dL (ref 6–20)
CO2: 26 mmol/L (ref 22–32)
Calcium: 9.7 mg/dL (ref 8.9–10.3)
Chloride: 105 mmol/L (ref 98–111)
Creatinine: 0.77 mg/dL (ref 0.44–1.00)
GFR, Estimated: >60 mL/min (ref >60)
Glucose, Bld: 108 mg/dL — ABNORMAL HIGH (ref 70–99)
Potassium: 3.5 mmol/L (ref 3.5–5.1)
Sodium: 141 mmol/L (ref 135–145)
Total Bilirubin: 0.4 mg/dL (ref 0.3–1.2)
Total Protein: 7.4 g/dL (ref 6.5–8.1)

## 2021-03-02 MED ORDER — SODIUM CHLORIDE 0.9 % IV SOLN
3.0000 mg/kg | Freq: Once | INTRAVENOUS | Status: AC
  Administered 2021-03-02: 220 mg via INTRAVENOUS
  Filled 2021-03-02: qty 8

## 2021-03-02 MED ORDER — ACETAMINOPHEN 325 MG PO TABS
650.0000 mg | ORAL_TABLET | Freq: Once | ORAL | Status: AC
  Administered 2021-03-02: 650 mg via ORAL

## 2021-03-02 MED ORDER — ACETAMINOPHEN 325 MG PO TABS
ORAL_TABLET | ORAL | Status: AC
  Filled 2021-03-02: qty 2

## 2021-03-02 MED ORDER — SODIUM CHLORIDE 0.9 % IV SOLN: Freq: Once | INTRAVENOUS | Status: AC

## 2021-03-02 MED ORDER — FAMOTIDINE 20 MG IN NS 100 ML IVPB
INTRAVENOUS | Status: AC
  Filled 2021-03-02: qty 100

## 2021-03-02 MED ORDER — SODIUM CHLORIDE 0.9% FLUSH
10.0000 mL | INTRAVENOUS | Status: DC | PRN
  Administered 2021-03-02: 10 mL via INTRAVENOUS

## 2021-03-02 MED ORDER — SODIUM CHLORIDE 0.9 % IV SOLN: 8.0000 mg | Freq: Once | INTRAVENOUS | Status: DC

## 2021-03-02 MED ORDER — FAMOTIDINE 20 MG IN NS 100 ML IVPB
20.0000 mg | Freq: Once | INTRAVENOUS | Status: AC
  Administered 2021-03-02: 20 mg via INTRAVENOUS

## 2021-03-02 MED ORDER — SODIUM CHLORIDE 0.9% FLUSH
10.0000 mL | INTRAVENOUS | Status: DC | PRN
  Administered 2021-03-02: 10 mL

## 2021-03-02 MED ORDER — GOSERELIN ACETATE 3.6 MG ~~LOC~~ IMPL
3.6000 mg | DRUG_IMPLANT | Freq: Once | SUBCUTANEOUS | Status: AC
  Administered 2021-03-02: 3.6 mg via SUBCUTANEOUS

## 2021-03-02 MED ORDER — HEPARIN SOD (PORK) LOCK FLUSH 100 UNIT/ML IV SOLN
500.0000 [IU] | Freq: Once | INTRAVENOUS | Status: AC | PRN
  Administered 2021-03-02: 500 [IU]

## 2021-03-02 MED ORDER — GOSERELIN ACETATE 3.6 MG ~~LOC~~ IMPL
DRUG_IMPLANT | SUBCUTANEOUS | Status: AC
  Filled 2021-03-02: qty 3.6

## 2021-03-02 MED ORDER — ONDANSETRON HCL 4 MG/2ML IJ SOLN
INTRAMUSCULAR | Status: AC
  Filled 2021-03-02: qty 4

## 2021-03-02 MED ORDER — ONDANSETRON HCL 4 MG/2ML IJ SOLN
8.0000 mg | Freq: Once | INTRAMUSCULAR | Status: AC
  Administered 2021-03-02: 8 mg via INTRAVENOUS

## 2021-03-02 NOTE — Telephone Encounter (Signed)
Pt called to f/u up on referral.. please advise

## 2021-03-02 NOTE — Telephone Encounter (Signed)
Appt sch 9/16

## 2021-03-02 NOTE — Progress Notes (Signed)
[facility] Follow up:    Natasha Rail, MD [place] [address]   DIAGNOSIS: Cancer Staging Breast cancer of upper-outer quadrant of left female breast [facility]) Staging form: Breast, AJCC 7th Edition - Clinical stage from 08/27/2014: Stage IIA (T2, N0, M0) - Unsigned Laterality: Left - Pathologic: T0, N0, cM0 - Unsigned Laterality: Left Staging form: Breast, AJCC 8th Edition - Pathologic stage from 08/12/2020: Stage IA (pT1b, pN0, cM0, G2, ER+, PR-, HER2+) - Signed by Natasha Phlegm, NP on 08/24/2020 Stage prefix: Initial diagnosis Histologic grading system: 3 grade system   SUMMARY OF ONCOLOGIC HISTORY: Oncology History  Breast cancer of upper-outer quadrant of left female breast ([place])  08/13/2014 Mammogram   Left breast: 2 cm circumscribed mass   08/13/2014 Breast US   Left breast: two masses: #1: 1:00 10 x 9 x 13 mmm irregular; #2: 2:00: 16 x 7 x 14 mm; no LAD   08/27/2014 Initial Diagnosis   O/S excisional biopsy: 2 masses showing 1.5 cm and 0.9 cm IDC, Grade 3, ER + (90%), PR- (0%), HER-2 positive (ratio 2.5), Ki67 ~30%, Multifocal   08/27/2014 Clinical Stage   Stage IIA/IIB: T2/T3 N0   09/17/2014 Procedure   MyRisk panel ([facility]) 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 Otherwise negative at APC, ATM, BARD1, BMPR1A, BRCA1, BRCA2, BRIP1, CHD1, CDK4, CDKN2A, CHEK2, MLH1, MSH2, MSH6, MUTYH, NBN, PALB2, PMS2, PTEN, RAD51C, RAD51D, SMAD4, STK11, and TP53   09/20/2014 Breast MRI   RIGHT: 1 x 0.8 x 0.9 cm lobulated border mass in the retroareolar lower slight lateral area. LEFT: hematoma with surrounding adjacent enhancement encompassing a 4.6 x 4.9 x 4.1 cm area.    09/27/2014 - 01/10/2015 Neo-Adjuvant Chemotherapy   Neoadjuvant TCH [place] every 3 week 6 followed by Herceptin maintenance completed 09/21/2015   01/13/2015 Breast MRI   Postsurgical changes in left breast without residual enhancing masses compatible with treatment  response   02/10/2015 Definitive Surgery   Bilateral mastectomies: RIGHT: benign.  LEFT: complete path response;  0/3 sentinel nodes   02/10/2015 Pathologic Stage   ypT0 ypN0 ypM0   02/22/2015 -  Anti-estrogen oral therapy   Zoladex with tamoxifen, stopped Zoladex for intolerance, decreased tamoxifen to 10 mg daily.   11/17/2015 Survivorship   Survivorship care plan completed and given to patient    Relapse/Recurrence   Within the upper-outer left breast anterior depth there are 2 adjacent irregular enhancing masses (2.1 cm and 1.5 cm) compatible with biopsy-proven malignancy. Indeterminate enhancing mass within the outer left breast posterior depth (1.8 cm). Indeterminate enhancing mass within the outer lower posterior right breast. 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 ER 80%, PR 0%, Her 2 Positive   08/12/2020 Cancer Staging   Staging form: Breast, AJCC 8th Edition - Pathologic stage from 08/12/2020: Stage IA (pT1b, pN0, cM0, G2, ER+, PR-, HER2+) - Signed by Natasha Phlegm, NP on 08/24/2020 Stage prefix: Initial diagnosis Histologic grading system: 3 grade system   09/07/2020 -  Chemotherapy    Patient is on Treatment Plan: BREAST ADO-TRASTUZUMAB EMTANSINE [facility]) Q21D       11/29/2020 Surgery   Left anterior lumpectomy: Benign, left posterior lumpectomy: Benign, left posterior tissue lateral margin excision: Benign with 3 intramammary lymph nodes negative for malignancy.  Left posterior tissue superior margin: Benign     CURRENT THERAPY: Kadcyla/Zoladex/Letrozole  INTERVAL HISTORY: Natasha Torres 40 y.o. female returns for evaluation prior  to receiving Kadcyla.  She is doing well today, however she is concerned about her most recent echo with the abnormal strain, and that she hasn't heard from our heart failure team about an appointment.  She notes that she has been feeling well, has no weight gain, palpitation, continues  to exercise, and feels great.     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  MEDICAL HISTORY: Past Medical History:  Diagnosis Date   Anemia    takes iron supplement   Anxiety    Family history of adverse reaction to anesthesia    pt's mother has hx. of post-op N/V   History of breast cancer 2016   left breast ca 08/2014   Recurrent breast cancer, left ([place]) dx'd 07/2020   Tattoo of skin 06/07/2016   new tattoo right ankle    SURGICAL HISTORY: Past Surgical History:  Procedure Laterality Date   BREAST LUMPECTOMY Left 08/2014   BREAST RECONSTRUCTION WITH PLACEMENT OF TISSUE EXPANDER AND FLEX HD (ACELLULAR HYDRATED DERMIS) Bilateral 02/10/2015   Procedure: BREAST RECONSTRUCTION WITH PLACEMENT OF TISSUE EXPANDER AND FLEX HD (ACELLULAR HYDRATED DERMIS);  Surgeon: Natasha Limbo, MD;  Location: [place];  Service: Plastics;  Laterality: Bilateral;   INCISION AND DRAINAGE OF WOUND Left 03/02/2015   Procedure: IRRIGATION BREAST POCKET AND EXCHANGE OF LEFT BREAST TISSUE EXPANDER;  Surgeon: Natasha Limbo, MD;  Location: [place];  Service: Plastics;  Laterality: Left;   LIPOSUCTION WITH LIPOFILLING Bilateral 06/07/2015   Procedure: LIPOSUCTION WITH LIPOFILLING TO BILATERAL CHEST;  Surgeon: Natasha Limbo, MD;  Location: [place];  Service: Plastics;  Laterality: Bilateral;   LIPOSUCTION WITH LIPOFILLING Bilateral 09/23/2015   Procedure: LIPOFILLING FROM BILATERAL  THIGHS TO BILATERAL CHEST ;  Surgeon: Natasha Limbo, MD;  Location: [place];  Service: Plastics;  Laterality: Bilateral;   LIPOSUCTION WITH LIPOFILLING Bilateral 06/15/2016   Procedure: LIPOFILLING FROM BILATERAL THIGH TO BILATERAL CHEST;  Surgeon: Natasha Limbo, MD;  Location: [place];  Service: Plastics;  Laterality: Bilateral;  LIPOFILLING FROM BILATERAL THIGH TO BILATERAL CHEST   MASTECTOMY Bilateral 02/10/2015   NIPPLE SPARING MASTECTOMY/SENTINAL LYMPH NODE BIOPSY/RECONSTRUCTION/PLACEMENT OF TISSUE EXPANDER Bilateral 02/10/2015   Procedure: BILATERAL  NIPPLE SPARING MASTECTOMY WITH LEFT  SENTINAL LYMPH NODE BIOPSY(RIGHT BREAST PROPHYLACTIC);  Surgeon: Rolm Bookbinder, MD;  Location: [place];  Service: General;  Laterality: Bilateral;   ORIF FINGER / THUMB FRACTURE Right 1994   thumb   PORT-A-CATH REMOVAL  01/07/2015   removed and replaced   PORT-A-CATH REMOVAL Right 09/23/2015   Procedure: REMOVAL PORT-A-CATH;  Surgeon: Natasha Limbo, MD;  Location: [place];  Service: Plastics;  Laterality: Right;   PORTACATH PLACEMENT  08/2014; 01/07/2015   PORTACATH PLACEMENT Right 09/06/2020   Procedure: INSERTION PORT-A-CATH;  Surgeon: Rolm Bookbinder, MD;  Location: [facility];  Service: General;  Laterality: Right;   RADIOACTIVE SEED GUIDED EXCISIONAL BREAST BIOPSY Left 11/29/2020   Procedure: RADIOACTIVE SEED GUIDED EXCISION OF LEFT BREAST CANCER x2;  Surgeon:  Rolm Bookbinder, MD;  Location: [place];  Service: General;  Laterality: Left;   REMOVAL OF BILATERAL TISSUE EXPANDERS WITH PLACEMENT OF BILATERAL BREAST IMPLANTS Bilateral 06/07/2015   Procedure: REMOVAL OF BILATERAL TISSUE EXPANDERS WITH PLACEMENT OF BILATERAL BREAST  SILICONE IMPLANTS;  Surgeon: Natasha Limbo, MD;  Location: [place];  Service: Plastics;  Laterality: Bilateral;   REMOVAL OF TISSUE EXPANDER AND PLACEMENT OF IMPLANT Right 02/27/2015    Procedure: REMOVAL OF TISSUE EXPANDER AND PLACEMENT OF NEW TISSUE EXPANDER;  Surgeon: Natasha Limbo, MD;  Location: [place];  Service: Plastics;  Laterality: Right;   SALPINGOOPHORECTOMY Right 11/22/2010   TISSUE EXPANDER PLACEMENT Left 03/02/2015   Procedure: TISSUE EXPANDER;  Surgeon: Natasha Limbo, MD;  Location: [place];  Service: Plastics;  Laterality: Left;   WISDOM TOOTH EXTRACTION  2001    SOCIAL HISTORY: Social History   Socioeconomic History   Marital status: Divorced    Spouse name: Harrell Gave   Number of children: 1   Years of education: Not on file   Highest education level: Not on file  Occupational History   Not on file  Tobacco Use   Smoking status: Never   Smokeless tobacco: Never  Vaping Use   Vaping Use: Never used  Substance and Sexual Activity   Alcohol use: Yes    Alcohol/week: 0.0 standard drinks    Comment: Occasional   Drug use: No   Sexual activity: Not Currently  Other Topics Concern   Not on file  Social History Narrative   ** Merged History Encounter **       Social Determinants of Health   Financial Resource Strain: Not on file  Food Insecurity: Not on file  Transportation Needs: Not on file  Physical Activity: Not on file  Stress: Not on file  Social Connections: Not on file  Intimate Partner Violence: Not on file    FAMILY HISTORY: Family History  Problem Relation Age of Onset   Hypertension Mother    Anesthesia problems Mother        post-op N/V   Prostate cancer Father 43   Depression Maternal Aunt    Dementia Maternal Grandmother    Cancer Maternal Grandfather    Heart attack Maternal Grandfather    Dementia Paternal Grandmother    Kidney cancer Paternal Grandmother        slow growing, no treatment   Cancer Paternal Grandfather    Prostate cancer Paternal Grandfather 44   Bone cancer Paternal Grandfather 76   Breast cancer Paternal Grandfather 61   Lung cancer Paternal Grandfather        dx late 39s; smoker.  thought  to be a 4th primary cancer    Review of Systems  Constitutional:  Negative for appetite change, chills, fatigue, fever and unexpected weight change.  HENT:   Negative for hearing loss, lump/mass and trouble swallowing.   Eyes:  Negative for eye problems and icterus.  Respiratory:  Negative for chest tightness, cough and shortness of breath.   Cardiovascular:  Negative for chest pain, leg swelling and palpitations.  Gastrointestinal:  Negative for abdominal distention, abdominal pain, constipation, diarrhea, nausea and vomiting.  Endocrine: Negative for hot flashes.  Genitourinary:  Negative for difficulty urinating.   Musculoskeletal:  Negative for arthralgias.  Skin:  Negative for itching and rash.  Neurological:  Negative for dizziness, extremity weakness, headaches and numbness.  Hematological:  Negative for adenopathy. Does not bruise/bleed easily.  Psychiatric/Behavioral:  Negative for depression. The patient  is not nervous/anxious.      PHYSICAL EXAMINATION  ECOG PERFORMANCE STATUS: 0 - Asymptomatic  Vitals:   03/02/21 1438  BP: 123/66  Pulse: 80  Resp: 20  Temp: 97.7 F (36.5 C)  SpO2: 100%    Physical Exam Constitutional:      General: She is not in acute distress.    Appearance: Normal appearance. She is not toxic-appearing.  HENT:     Head: Normocephalic and atraumatic.  Eyes:     General: No scleral icterus. Cardiovascular:     Rate and Rhythm: Normal rate and regular rhythm.     Pulses: Normal pulses.     Heart sounds: Normal heart sounds.  Pulmonary:     Effort: Pulmonary effort is normal.     Breath sounds: Normal breath sounds.  Abdominal:     General: Abdomen is flat. Bowel sounds are normal. There is no distension.     Palpations: Abdomen is soft.     Tenderness: There is no abdominal tenderness.  Musculoskeletal:        General: No swelling.     Cervical back: Neck supple.  Lymphadenopathy:     Cervical: No cervical adenopathy.  Skin:     General: Skin is warm and dry.     Findings: No rash.  Neurological:     General: No focal deficit present.     Mental Status: She is alert.  Psychiatric:        Mood and Affect: Mood normal.        Behavior: Behavior normal.    LABORATORY DATA:  CBC    Component Value Date/Time   WBC 5.3 03/02/2021 1412   WBC 3.5 (L) 09/01/2020 0817   RBC 3.95 03/02/2021 1412   HGB 11.6 (L) 03/02/2021 1412   HGB 12.4 11/20/2016 0936   HCT 35.1 (L) 03/02/2021 1412   HCT 37.4 11/20/2016 0936   PLT 180 03/02/2021 1412   PLT 172 11/20/2016 0936   MCV 88.9 03/02/2021 1412   MCV 91.5 11/20/2016 0936   MCH 29.4 03/02/2021 1412   MCHC 33.0 03/02/2021 1412   RDW 12.5 03/02/2021 1412   RDW 13.1 11/20/2016 0936   LYMPHSABS 2.2 03/02/2021 1412   LYMPHSABS 1.6 11/20/2016 0936   MONOABS 0.5 03/02/2021 1412   MONOABS 0.5 11/20/2016 0936   EOSABS 0.1 03/02/2021 1412   EOSABS 0.1 11/20/2016 0936   BASOSABS 0.1 03/02/2021 1412   BASOSABS 0.0 11/20/2016 0936    CMP     Component Value Date/Time   NA 141 03/02/2021 1412   NA 140 11/20/2016 0936   K 3.5 03/02/2021 1412   K 4.3 11/20/2016 0936   CL 105 03/02/2021 1412   CO2 26 03/02/2021 1412   CO2 24 11/20/2016 0936   GLUCOSE 108 (H) 03/02/2021 1412   GLUCOSE 84 11/20/2016 0936   BUN 7 03/02/2021 1412   BUN 11.8 11/20/2016 0936   CREATININE 0.77 03/02/2021 1412   CREATININE 0.8 11/20/2016 0936   CALCIUM 9.7 03/02/2021 1412   CALCIUM 9.6 11/20/2016 0936   PROT 7.4 03/02/2021 1412   PROT 6.9 11/20/2016 0936   ALBUMIN 4.2 03/02/2021 1412   ALBUMIN 3.9 11/20/2016 0936   AST 41 03/02/2021 1412   AST 29 11/20/2016 0936   ALT 29 03/02/2021 1412   ALT 15 11/20/2016 0936   ALKPHOS 91 03/02/2021 1412   ALKPHOS 51 11/20/2016 0936   BILITOT 0.4 03/02/2021 1412   BILITOT 0.44 11/20/2016 0936   GFRNONAA >60 03/02/2021  [place] 02/27/2015 1135    ASSESSMENT and THERAPY PLAN:   Breast cancer of upper-outer quadrant of left female  breast 08/13/2014: Left breast IDC. Neo adj chemo with TCHP foll by Herceptin maintenance (path CR: Bil mastectomies and reconstruction), Tamoxifen (stoped and started due to intolerance) ATM gene mutation   08/12/20 Relapsed Breast Cancer: Left Breast UOQ 2.1 cm and 1.5 cm. Indeterminate 1.8 cm and Right breast indeterminate 1.5 cm.  Biopsy revealed IDC with DCIS, ER 80%, PR 0%, HER-2 3+ IHC positive   Bone scans 08/22/2020: Results awaited CT scans: scheduled for 08/24/20 Left breast Biopsy 3 o clock:Grade 2-3 IMC with lobular features ER 60%, PR 10%, Ki-67 20%, HER-2 3+ Rt Breast Biopsy: Benign   Treatment plan: 1.  chemotherapy with Kadcyla for currently unresectable breast cancer 4 cycles completed 11/10/2020 2. Surgery 11/29/2020: Pathologic complete response 3. Followed by XRT (have to discuss the pros and cons of radiation given ATM mutation) 4. Further adjuvant antiestrogen therapy (ovarian suppression or oophorectomy plus Letrozole) Adding Zoladex monthly to her treatment plan ------------------------------------------------------------------ Treatment plan: Continue maintenance Kadcyla for 1 year. Antiestrogen therapy with Zoladex with Letrozole Echo on 6/10: EF 61%   Kadcyla toxicities: Elevated liver enzymes: resolved since dose reduction Fatigue: improved since dose reduction  Abnormal heart strain on echo.  She has no symptoms of heart failure.  She is feeling great and exercising without difficulty.  I reached out to Dr. Haroldine Laws today about her case personally since the referral hasn't been processed yet.  I reviewed the above with Dr. Lindi Adie and he agrees that its ok to proceed with Kadcyla.     Return every 3 weeks for Kadcyla, office visit with every other treatment.     All questions were answered. The patient knows to call the clinic with any problems, questions or concerns. We can certainly see the patient much sooner if necessary.  Total encounter time: 20  minutes  Natasha Bihari, NP 03/04/21 2:53 PM Medical Oncology and Hematology [facility] [address] [place], [place] [address] Tel. [phone]    Fax. [phone]  *Total Encounter Time as defined by the Centers for Medicare and Medicaid Services includes, in addition to the face-to-face time of a patient visit (documented in the note above) non-face-to-face time: obtaining and reviewing outside history, ordering and reviewing medications, tests or procedures, care coordination (communications with other health care professionals or caregivers) and documentation in the medical record.

## 2021-03-02 NOTE — Patient Instructions (Addendum)
[facility] ONCOLOGY  Discharge Instructions: Thank you for choosing [facility] to provide your oncology and hematology care.   If you have a lab appointment with the [facility], please go directly to the [facility] and check in at the registration area.   Wear comfortable clothing and clothing appropriate for easy access to any Portacath or PICC line.   We strive to give you quality time with your provider. You may need to reschedule your appointment if you arrive late (15 or more minutes).  Arriving late affects you and other patients whose appointments are after yours.  Also, if you miss three or more appointments without notifying the office, you may be dismissed from the clinic at the provider's discretion.      For prescription refill requests, have your pharmacy contact our office and allow 72 hours for refills to be completed.    Today you received the following chemotherapy and/or immunotherapy agent: Trastuzumab (Kadcyla).   To help prevent nausea and vomiting after your treatment, we encourage you to take your nausea medication as directed.  BELOW ARE SYMPTOMS THAT SHOULD BE REPORTED IMMEDIATELY: *FEVER GREATER THAN 100.4 F (38 C) OR HIGHER *CHILLS OR SWEATING *NAUSEA AND VOMITING THAT IS NOT CONTROLLED WITH YOUR NAUSEA MEDICATION *UNUSUAL SHORTNESS OF BREATH *UNUSUAL BRUISING OR BLEEDING *URINARY PROBLEMS (pain or burning when urinating, or frequent urination) *BOWEL PROBLEMS (unusual diarrhea, constipation, pain near the anus) TENDERNESS IN MOUTH AND THROAT WITH OR WITHOUT PRESENCE OF ULCERS (sore throat, sores in mouth, or a toothache) UNUSUAL RASH, SWELLING OR PAIN  UNUSUAL VAGINAL DISCHARGE OR ITCHING   Items with * indicate a potential emergency and should be followed up as soon as possible or go to the Emergency Department if any problems should occur.  Please show the CHEMOTHERAPY ALERT CARD or IMMUNOTHERAPY ALERT CARD at  check-in to the Emergency Department and triage nurse.  Should you have questions after your visit or need to cancel or reschedule your appointment, please contact [facility]  Dept: [phone]  and follow the prompts.  Office hours are 8:00 a.m. to 4:30 p.m. Monday - Friday. Please note that voicemails left after 4:00 p.m. may not be returned until the following business day.  We are closed weekends and major holidays. You have access to a nurse at all times for urgent questions. Please call the main number to the clinic Dept: [phone] and follow the prompts.   For any non-urgent questions, you may also contact your provider using MyChart. We now offer e-Visits for anyone 6 and older to request care online for non-urgent symptoms. For details visit [email or web address].   Also download the MyChart app! Go to the app store, search "MyChart", open the app, select [facility], and log in with your MyChart username and password.  Due to Covid, a mask is required upon entering the hospital/clinic. If you do not have a mask, one will be given to you upon arrival. For doctor visits, patients may have 1 support person aged 20 or older with them. For treatment visits, patients cannot have anyone with them due to current Covid guidelines and our immunocompromised population.   Goserelin injection What is this medication? GOSERELIN (GOE se rel in) is similar to a hormone found in the body. It lowers the amount of sex hormones that the body makes. Men will have lower testosterone levels and women will have lower estrogen levels while taking this medicine. In men, this  medicine is used to treat prostate cancer; the injection is either given once per month or once every 12 weeks. A once per month injection (only) is used to treat women with endometriosis, dysfunctional uterine bleeding, or advanced breast cancer. This medicine may be used for other purposes; ask your health  care provider or pharmacist if you have questions. COMMON BRAND NAME(S): Zoladex What should I tell my care team before I take this medication? They need to know if you have any of these conditions: bone problems diabetes heart disease history of irregular heartbeat an unusual or allergic reaction to goserelin, other medicines, foods, dyes, or preservatives pregnant or trying to get pregnant breast-feeding How should I use this medication? This medicine is for injection under the skin. It is given by a health care professional in a hospital or clinic setting. Talk to your pediatrician regarding the use of this medicine in children. Special care may be needed. Overdosage: If you think you have taken too much of this medicine contact a poison control center or emergency room at once. NOTE: This medicine is only for you. Do not share this medicine with others. What if I miss a dose? It is important not to miss your dose. Call your doctor or health care professional if you are unable to keep an appointment. What may interact with this medication? Do not take this medicine with any of the following medications: cisapride dronedarone pimozide thioridazine This medicine may also interact with the following medications: other medicines that prolong the QT interval (an abnormal heart rhythm) This list may not describe all possible interactions. Give your health care provider a list of all the medicines, herbs, non-prescription drugs, or dietary supplements you use. Also tell them if you smoke, drink alcohol, or use illegal drugs. Some items may interact with your medicine. What should I watch for while using this medication? Visit your doctor or health care provider for regular checks on your progress. Your symptoms may appear to get worse during the first weeks of this therapy. Tell your doctor or healthcare provider if your symptoms do not start to get better or if they get worse after this  time. Your bones may get weaker if you take this medicine for a long time. If you smoke or frequently drink alcohol you may increase your risk of bone loss. A family history of osteoporosis, chronic use of drugs for seizures (convulsions), or corticosteroids can also increase your risk of bone loss. Talk to your doctor about how to keep your bones strong. This medicine should stop regular monthly menstruation in women. Tell your doctor if you continue to menstruate. Women should not become pregnant while taking this medicine or for 12 weeks after stopping this medicine. Women should inform their doctor if they wish to become pregnant or think they might be pregnant. There is a potential for serious side effects to an unborn child. Talk to your health care professional or pharmacist for more information. Do not breast-feed an infant while taking this medicine. Men should inform their doctors if they wish to father a child. This medicine may lower sperm counts. Talk to your health care professional or pharmacist for more information. This medicine may increase blood sugar. Ask your healthcare provider if changes in diet or medicines are needed if you have diabetes. What side effects may I notice from receiving this medication? Side effects that you should report to your doctor or health care professional as soon as possible: allergic reactions like skin  rash, itching or hives, swelling of the face, lips, or tongue bone pain breathing problems changes in vision chest pain feeling faint or lightheaded, falls fever, chills pain, swelling, warmth in the leg pain, tingling, numbness in the hands or feet signs and symptoms of high blood sugar such as being more thirsty or hungry or having to urinate more than normal. You may also feel very tired or have blurry vision signs and symptoms of low blood pressure like dizziness; feeling faint or lightheaded, falls; unusually weak or tired stomach pain swelling  of the ankles, feet, hands trouble passing urine or change in the amount of urine unusually high or low blood pressure unusually weak or tired Side effects that usually do not require medical attention (report to your doctor or health care professional if they continue or are bothersome): change in sex drive or performance changes in breast size in both males and females changes in emotions or moods headache hot flashes irritation at site where injected loss of appetite skin problems like acne, dry skin vaginal dryness This list may not describe all possible side effects. Call your doctor for medical advice about side effects. You may report side effects to FDA at 1-800-FDA-1088. Where should I keep my medication? This drug is given in a hospital or clinic and will not be stored at home. NOTE: This sheet is a summary. It may not cover all possible information. If you have questions about this medicine, talk to your doctor, pharmacist, or health care provider.  2022 Elsevier/Gold Standard (2018-09-22 14:05:56)

## 2021-03-03 ENCOUNTER — Ambulatory Visit: Payer: BC Managed Care – PPO

## 2021-03-03 ENCOUNTER — Ambulatory Visit (HOSPITAL_COMMUNITY)
Admission: RE | Admit: 2021-03-03 | Discharge: 2021-03-03 | Disposition: A | Discharge status: Home / Self Care | Payer: BC Managed Care – PPO | Source: Ambulatory Visit | Attending: Cardiology | Admitting: Cardiology

## 2021-03-03 ENCOUNTER — Encounter (HOSPITAL_COMMUNITY): Payer: Self-pay | Admitting: Cardiology

## 2021-03-03 VITALS — BP 110/68 | HR 62 | Wt 164.0 lb

## 2021-03-03 DIAGNOSIS — C50412 Malignant neoplasm of upper-outer quadrant of left female breast: Secondary | ICD-10-CM | POA: Diagnosis not present

## 2021-03-03 DIAGNOSIS — Z170 Estrogen receptor positive status [ER+]: Secondary | ICD-10-CM

## 2021-03-03 DIAGNOSIS — Z803 Family history of malignant neoplasm of breast: Secondary | ICD-10-CM | POA: Diagnosis not present

## 2021-03-03 DIAGNOSIS — I427 Cardiomyopathy due to drug and external agent: Secondary | ICD-10-CM | POA: Diagnosis not present

## 2021-03-03 DIAGNOSIS — Z01818 Encounter for other preprocedural examination: Secondary | ICD-10-CM | POA: Diagnosis not present

## 2021-03-03 DIAGNOSIS — T451X5A Adverse effect of antineoplastic and immunosuppressive drugs, initial encounter: Secondary | ICD-10-CM

## 2021-03-03 MED ORDER — CANDESARTAN CILEXETIL 4 MG PO TABS: 4.0000 mg | ORAL_TABLET | Freq: Every day | ORAL | 3 refills | Status: DC

## 2021-03-03 MED ORDER — BISOPROLOL FUMARATE 5 MG PO TABS: 2.5000 mg | ORAL_TABLET | Freq: Every day | ORAL | 3 refills | Status: DC

## 2021-03-03 NOTE — Patient Instructions (Signed)
Start Bisoprolol 2.5 mg (1/2 tab) Daily at Bedtime  Start Candesartan 4 mg Daily at Bedtime  Your physician recommends that you schedule a follow-up appointment in:  6 weeks with echocardiogram  If you have any questions or concerns before your next appointment please send [place] a message through [facility] or call our office at [phone].    TO LEAVE A MESSAGE FOR THE NURSE SELECT OPTION 2, PLEASE LEAVE A MESSAGE INCLUDING: YOUR NAME DATE OF BIRTH CALL BACK NUMBER REASON FOR CALL**this is important as we prioritize the call backs  YOU WILL RECEIVE A CALL BACK THE SAME DAY AS LONG AS YOU CALL BEFORE 4:00 PM

## 2021-03-04 ENCOUNTER — Encounter: Payer: Self-pay | Admitting: Hematology and Oncology

## 2021-03-04 NOTE — Assessment & Plan Note (Signed)
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 Surgery 11/29/2020: Pathologic complete response 3. Followed by Jeanie Cooks to discuss the pros and cons of radiation given ATM mutation) 4.Further adjuvant antiestrogen therapy (ovarian suppression or oophorectomy plus Letrozole) Adding Zoladex monthly to her treatment plan ------------------------------------------------------------------ Treatment plan: Continue maintenance Kadcyla for 1 year. Antiestrogen therapy with Zoladex with Letrozole Echo on 6/10: EF 61%  Kadcyla toxicities: Elevated liver enzymes: resolved since dose reduction Fatigue: improved since dose reduction  Abnormal heart strain on echo.  She has no symptoms of heart failure.  She is feeling great and exercising without difficulty.  I reached out to Dr. Haroldine Laws today about her case personally since the referral hasn't been processed yet.  I reviewed the above with Dr. Lindi Adie and he agrees that its ok to proceed with Kadcyla.

## 2021-03-05 NOTE — Progress Notes (Signed)
P  Advanced Heart Failure Clinic Note   Patient ID: Natasha Torres, female   DOB: 1981/02/17, 40 y.o.   MRN: [id number] Primary oncologist: Dr Lindi Adie HF: Dr Aundra Dubin.  40 y.o. with left breast cancer presents for evaluation of fall in EF and strain by echo monitoring.  Breast cancer was diagnosed in 2/16.  ER+/PR-/HER2+.  Treated with neoadjuvant chemo (carboplatin, docetaxel, Herceptin, and pertuzumab).  She was continued on Herceptin for a year.  She is s/p mastectomy.  She had an echo in 4/16 showed EF 55-60% with GLS -25%.  However, repeat echo in 6/16 during Herceptin treatment showed fall in EF to 50-55% with GLS fallen to -17%.  Echo in 8/16 showed EF down to 45%.  At that time, Herceptin was held for 6 weeks and later restarted.  Echo 4/17 showed EF 55-60% with GLS -24.7%.  She was able to complete her year of Herceptin with use of candesartan and Coreg.   In 2/22, she had a left-sided relapse, ER+/PR-/HER2+.  She was started on Kadcyla and had left lumpectomy. Plan for [place] until 12/22.  Initial echo in 6/22 showed EF 61% with GLS -19.4%.  Repeat echo in 9/22, however, showed EF 60-65%, GLS -15.1% with normal RV (less negative strain).   She generally feels well, exercises regularly.  Works as Dietitian at [employer].  No dyspnea or chest pain.   Labs (7/16): K 4, creatinine 0.7 Labs (8/16): K 4.3, creatinine 0.64 Labs (9/16): K 3.7, creatinine 0.81 Labs (11/16): K 4.1, creatinine 0.7 Labs (4/17): K 4, creatinine 0.8 Labs (9/22): K 3.5, creatinine 0.77  PMH: 1. Breast cancer: On left, diagnosed 2/16.  ER+/PR-/HER2+.  Treated with neoadjuvant chemo (carboplatin, docetaxel, Herceptin, and pertuzumab).  She completed this treatment and finished 1 year Herceptin.  S/p mastectomy. She had a left-sided recurrence in 2/22, ER+/PR-/HER2+.  She started on Kadcyla and had 6/22 left anterior lumpectomy.  2. 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  - Echo (4/16) with EF  55-60%, GLS -25%. - Echo (6/16) with EF 50-55%, GLS -17% - Echo (8/16) with EF 45%, GLS -18.6%, lateral s' 13.5 cm/sec - Echo (9/16) with EF 50-55%, no strain done, lateral s' 13.1 cm/sec - Echo (11/16) with EF 50%, GLS -15.8% (poor signal), lateral s' 14.3 cm/sec - Echo (1/17) with EF 55%, GLS -57.5%, normal diastolic function, normal RV size and systolic function.  - Echo (4/17) with EF 05-18%, normal diastolic function, GLS -33.5%.  - Echo (10/17) with EF 82%, normal diastolic function, GLS -51.8%, normal RV size and systolic function.  - Echo (6/22) with EF 61%, GLS -19.4% - Echo (9/22) with EF 60-65%, GLS -15.1%, RV normal.   Social History   Socioeconomic History   Marital status: Divorced    Spouse name: Harrell Gave   Number of children: 1   Years of education: Not on file   Highest education level: Not on file  Occupational History   Not on file  Tobacco Use   Smoking status: Never   Smokeless tobacco: Never  Vaping Use   Vaping Use: Never used  Substance and Sexual Activity   Alcohol use: Yes    Alcohol/week: 0.0 standard drinks    Comment: Occasional   Drug use: No   Sexual activity: Not Currently  Other Topics Concern   Not on file  Social History Narrative   ** Merged History Encounter **       Social Determinants of Health   Financial Resource Strain: Not on  file  Food Insecurity: Not on file  Transportation Needs: Not on file  Physical Activity: Not on file  Stress: Not on file  Social Connections: Not on file   Family History  Problem Relation Age of Onset   Hypertension Mother    Anesthesia problems Mother        post-op N/V   Prostate cancer Father 74   Depression Maternal Aunt    Dementia Maternal Grandmother    Cancer Maternal Grandfather    Heart attack Maternal Grandfather    Dementia Paternal Grandmother    Kidney cancer Paternal Grandmother        slow growing, no treatment   Cancer Paternal Grandfather    Prostate cancer Paternal  Grandfather 10   Bone cancer Paternal Grandfather 72   Breast cancer Paternal Grandfather 34   Lung cancer Paternal Grandfather        dx late 43s; smoker.  thought to be a 4th primary cancer   ROS: All systems reviewed and negative except as per HPI.  Current Outpatient Medications  Medication Sig Dispense Refill   Ado-Trastuzumab Emtansine (KADCYLA IV) Kadcyla  q 3 wks     Ashwagandha 500 MG CAPS Take by mouth. Patient takes at bedtime     bisoprolol (ZEBETA) 5 MG tablet Take 0.5 tablets (2.5 mg total) by mouth at bedtime. 15 tablet 3   Black Pepper-Turmeric 3-500 MG CAPS Take by mouth.     candesartan (ATACAND) 4 MG tablet Take 1 tablet (4 mg total) by mouth at bedtime. 30 tablet 3   Cholecalciferol (VITAMIN D3) 50 MCG (2000 UT) TABS Take 2,000 Units by mouth at bedtime.     Cyanocobalamin (B-12) 100 MCG TABS Take by mouth. Patient takes 1 tablet at lunch time.     Ferrous Sulfate (IRON) 325 (65 Fe) MG TABS Take by mouth. Patient takes 1 tablet at lunch time.     folic acid (FOLVITE) 962 MCG tablet Take 400 mcg by mouth daily.     letrozole (FEMARA) 2.5 MG tablet Take 1 tablet (2.5 mg total) by mouth daily. 90 tablet 3   lidocaine-prilocaine (EMLA) cream Apply to affected area once 30 g 3   LORazepam (ATIVAN) 0.5 MG tablet Take 1 tablet (0.5 mg total) by mouth every 6 (six) hours as needed for anxiety. (Patient taking differently: Take 0.5 mg by mouth. Patient takes 1 tablet once a day.) 30 tablet 3   Magnesium Hydroxide 400 MG CHEW Chew 400 mg by mouth at bedtime.     Multiple Vitamins-Minerals (ADULT ONE DAILY GUMMIES PO) Take 2 tablets by mouth at bedtime.     ondansetron (ZOFRAN) 8 MG tablet Take 1 tablet (8 mg total) by mouth every 8 (eight) hours as needed for nausea or vomiting. 30 tablet 1   prochlorperazine (COMPAZINE) 10 MG tablet Take 1 tablet (10 mg total) by mouth every 6 (six) hours as needed for nausea or vomiting. 30 tablet 1   Turmeric 500 MG CAPS Take 500 mg by mouth  at bedtime. (Patient taking differently: Take 500 mg by mouth daily.)     No current facility-administered medications for this encounter.   BP 110/68   Pulse 62   Wt 74.4 kg (164 lb)   LMP 08/28/2020   SpO2 100%   BMI 22.87 kg/m - She can stop candesartan.  General: NAD Neck: No JVD, no thyromegaly or thyroid nodule.  Lungs: Clear to auscultation bilaterally with normal respiratory effort. CV: Nondisplaced PMI.  Heart regular  S1/S2, no S3/S4, no murmur.  No peripheral edema.  No carotid bruit.  Normal pedal pulses.  Abdomen: Soft, nontender, no hepatosplenomegaly, no distention.  Skin: Intact without lesions or rashes.  Neurologic: Alert and oriented x 3.  Psych: Normal affect. Extremities: No clubbing or cyanosis.  HEENT: Normal.   40 y.o. with history of breast cancer.  She had a transient fall in EF and strain with Herceptin back in 2017.  She was started candesartan and Coreg, and Herceptin was held.  EF improved back to normal range and she completed Herceptin therapy.  She now has a relapse and is going to be on Kadcyla until 12/22.  She strain is significantly less negative on 9/22 echo (reviewed today) though EF remains normal.  I am concerned that the less negative strain is due to [place].   - Start bisoprolol 2.5 qhs and candesartan 4 mg qhs (take in evening to have less effect on BP during the day).   - Continue Kadcyla for now but repeat echo in 6 weeks with followup.   Loralie Champagne  03/05/2021

## 2021-03-06 ENCOUNTER — Encounter: Payer: Self-pay | Admitting: *Deleted

## 2021-03-23 ENCOUNTER — Inpatient Hospital Stay: Payer: BC Managed Care – PPO | Attending: Hematology and Oncology

## 2021-03-23 ENCOUNTER — Inpatient Hospital Stay: Payer: BC Managed Care – PPO

## 2021-03-23 ENCOUNTER — Other Ambulatory Visit: Payer: Self-pay

## 2021-03-23 VITALS — BP 109/54 | HR 50 | Temp 98.1°F | Resp 16 | Ht 71.0 in | Wt 159.2 lb

## 2021-03-23 DIAGNOSIS — Z803 Family history of malignant neoplasm of breast: Secondary | ICD-10-CM | POA: Insufficient documentation

## 2021-03-23 DIAGNOSIS — C50412 Malignant neoplasm of upper-outer quadrant of left female breast: Secondary | ICD-10-CM | POA: Diagnosis not present

## 2021-03-23 DIAGNOSIS — Z170 Estrogen receptor positive status [ER+]: Secondary | ICD-10-CM | POA: Diagnosis not present

## 2021-03-23 DIAGNOSIS — Z1509 Genetic susceptibility to other malignant neoplasm: Secondary | ICD-10-CM | POA: Insufficient documentation

## 2021-03-23 DIAGNOSIS — Z853 Personal history of malignant neoplasm of breast: Secondary | ICD-10-CM | POA: Diagnosis not present

## 2021-03-23 DIAGNOSIS — Z5111 Encounter for antineoplastic chemotherapy: Secondary | ICD-10-CM | POA: Diagnosis not present

## 2021-03-23 DIAGNOSIS — Z9013 Acquired absence of bilateral breasts and nipples: Secondary | ICD-10-CM | POA: Diagnosis not present

## 2021-03-23 DIAGNOSIS — E559 Vitamin D deficiency, unspecified: Secondary | ICD-10-CM | POA: Diagnosis not present

## 2021-03-23 DIAGNOSIS — D509 Iron deficiency anemia, unspecified: Secondary | ICD-10-CM | POA: Insufficient documentation

## 2021-03-23 DIAGNOSIS — Z801 Family history of malignant neoplasm of trachea, bronchus and lung: Secondary | ICD-10-CM | POA: Diagnosis not present

## 2021-03-23 DIAGNOSIS — Z8042 Family history of malignant neoplasm of prostate: Secondary | ICD-10-CM | POA: Diagnosis not present

## 2021-03-23 DIAGNOSIS — Z79810 Long term (current) use of selective estrogen receptor modulators (SERMs): Secondary | ICD-10-CM | POA: Diagnosis not present

## 2021-03-23 DIAGNOSIS — Z95828 Presence of other vascular implants and grafts: Secondary | ICD-10-CM

## 2021-03-23 LAB — CBC WITH DIFFERENTIAL (CANCER CENTER ONLY)
Abs Immature Granulocytes: 0.01 10*3/uL (ref 0.00–0.07)
Basophils Absolute: 0.1 10*3/uL (ref 0.0–0.1)
Basophils Relative: 1 %
Eosinophils Absolute: 0.1 10*3/uL (ref 0.0–0.5)
Eosinophils Relative: 2 %
HCT: 33.2 % — ABNORMAL LOW (ref 36.0–46.0)
Hemoglobin: 11 g/dL — ABNORMAL LOW (ref 12.0–15.0)
Immature Granulocytes: 0 %
Lymphocytes Relative: 44 %
Lymphs Abs: 2.5 10*3/uL (ref 0.7–4.0)
MCH: 29.4 pg (ref 26.0–34.0)
MCHC: 33.1 g/dL (ref 30.0–36.0)
MCV: 88.8 fL (ref 80.0–100.0)
Monocytes Absolute: 0.5 10*3/uL (ref 0.1–1.0)
Monocytes Relative: 10 %
Neutro Abs: 2.4 10*3/uL (ref 1.7–7.7)
Neutrophils Relative %: 43 %
Platelet Count: 169 10*3/uL (ref 150–400)
RBC: 3.74 MIL/uL — ABNORMAL LOW (ref 3.87–5.11)
RDW: 12.2 % (ref 11.5–15.5)
WBC Count: 5.6 10*3/uL (ref 4.0–10.5)
nRBC: 0 % (ref 0.0–0.2)

## 2021-03-23 LAB — CMP (CANCER CENTER ONLY)
ALT: 35 U/L (ref 0–44)
AST: 43 U/L — ABNORMAL HIGH (ref 15–41)
Albumin: 3.9 g/dL (ref 3.5–5.0)
Alkaline Phosphatase: 92 U/L (ref 38–126)
Anion gap: 8 (ref 5–15)
BUN: 7 mg/dL (ref 6–20)
CO2: 27 mmol/L (ref 22–32)
Calcium: 9.3 mg/dL (ref 8.9–10.3)
Chloride: 106 mmol/L (ref 98–111)
Creatinine: 0.76 mg/dL (ref 0.44–1.00)
GFR, Estimated: >60 mL/min (ref >60)
Glucose, Bld: 100 mg/dL — ABNORMAL HIGH (ref 70–99)
Potassium: 3.6 mmol/L (ref 3.5–5.1)
Sodium: 141 mmol/L (ref 135–145)
Total Bilirubin: 0.3 mg/dL (ref 0.3–1.2)
Total Protein: 7.1 g/dL (ref 6.5–8.1)

## 2021-03-23 MED ORDER — ACETAMINOPHEN 325 MG PO TABS
650.0000 mg | ORAL_TABLET | Freq: Once | ORAL | Status: AC
  Administered 2021-03-23: 650 mg via ORAL
  Filled 2021-03-23: qty 2

## 2021-03-23 MED ORDER — SODIUM CHLORIDE 0.9 % IV SOLN
Freq: Once | INTRAVENOUS | Status: AC
Start: 2021-03-23 — End: 2021-03-23

## 2021-03-23 MED ORDER — ONDANSETRON HCL 4 MG/2ML IJ SOLN
8.0000 mg | Freq: Once | INTRAMUSCULAR | Status: AC
  Administered 2021-03-23: 8 mg via INTRAVENOUS
  Filled 2021-03-23: qty 4

## 2021-03-23 MED ORDER — SODIUM CHLORIDE 0.9% FLUSH
10.0000 mL | INTRAVENOUS | Status: AC | PRN
  Administered 2021-03-23: 10 mL

## 2021-03-23 MED ORDER — HEPARIN SOD (PORK) LOCK FLUSH 100 UNIT/ML IV SOLN
500.0000 [IU] | Freq: Once | INTRAVENOUS | Status: AC | PRN
  Administered 2021-03-23: 500 [IU]

## 2021-03-23 MED ORDER — SODIUM CHLORIDE 0.9 % IV SOLN
3.0000 mg/kg | Freq: Once | INTRAVENOUS | Status: AC
  Administered 2021-03-23: 220 mg via INTRAVENOUS
  Filled 2021-03-23: qty 8

## 2021-03-23 MED ORDER — FAMOTIDINE 20 MG IN NS 100 ML IVPB
20.0000 mg | Freq: Once | INTRAVENOUS | Status: AC
  Administered 2021-03-23: 20 mg via INTRAVENOUS
  Filled 2021-03-23: qty 100

## 2021-03-23 MED ORDER — SODIUM CHLORIDE 0.9% FLUSH
10.0000 mL | INTRAVENOUS | Status: DC | PRN
  Administered 2021-03-23: 10 mL

## 2021-03-27 ENCOUNTER — Encounter: Payer: Self-pay | Admitting: Hematology and Oncology

## 2021-03-30 ENCOUNTER — Ambulatory Visit: Payer: BC Managed Care – PPO

## 2021-03-31 ENCOUNTER — Inpatient Hospital Stay: Payer: BC Managed Care – PPO

## 2021-03-31 ENCOUNTER — Other Ambulatory Visit: Payer: Self-pay

## 2021-03-31 ENCOUNTER — Ambulatory Visit: Payer: BC Managed Care – PPO

## 2021-03-31 VITALS — BP 107/46 | HR 62 | Temp 98.4°F | Resp 16

## 2021-03-31 DIAGNOSIS — C50412 Malignant neoplasm of upper-outer quadrant of left female breast: Secondary | ICD-10-CM

## 2021-03-31 MED ORDER — GOSERELIN ACETATE 3.6 MG ~~LOC~~ IMPL
3.6000 mg | DRUG_IMPLANT | Freq: Once | SUBCUTANEOUS | Status: AC
  Administered 2021-03-31: 3.6 mg via SUBCUTANEOUS
  Filled 2021-03-31: qty 3.6

## 2021-04-04 ENCOUNTER — Other Ambulatory Visit (HOSPITAL_COMMUNITY): Payer: Self-pay | Admitting: Cardiology

## 2021-04-13 ENCOUNTER — Encounter: Payer: Self-pay | Admitting: Adult Health

## 2021-04-13 ENCOUNTER — Inpatient Hospital Stay: Payer: BC Managed Care – PPO

## 2021-04-13 ENCOUNTER — Other Ambulatory Visit: Payer: Self-pay

## 2021-04-13 ENCOUNTER — Inpatient Hospital Stay (HOSPITAL_BASED_OUTPATIENT_CLINIC_OR_DEPARTMENT_OTHER): Payer: BC Managed Care – PPO | Admitting: Adult Health

## 2021-04-13 VITALS — BP 103/59 | HR 72 | Temp 97.9°F | Resp 18 | Ht 71.0 in | Wt 161.7 lb

## 2021-04-13 VITALS — BP 114/53 | HR 53 | Temp 98.2°F | Resp 16

## 2021-04-13 DIAGNOSIS — C50412 Malignant neoplasm of upper-outer quadrant of left female breast: Secondary | ICD-10-CM

## 2021-04-13 DIAGNOSIS — Z95828 Presence of other vascular implants and grafts: Secondary | ICD-10-CM

## 2021-04-13 LAB — CBC WITH DIFFERENTIAL (CANCER CENTER ONLY)
Abs Immature Granulocytes: 0.01 10*3/uL (ref 0.00–0.07)
Basophils Absolute: 0.1 10*3/uL (ref 0.0–0.1)
Basophils Relative: 1 %
Eosinophils Absolute: 0.1 10*3/uL (ref 0.0–0.5)
Eosinophils Relative: 2 %
HCT: 32.6 % — ABNORMAL LOW (ref 36.0–46.0)
Hemoglobin: 10.7 g/dL — ABNORMAL LOW (ref 12.0–15.0)
Immature Granulocytes: 0 %
Lymphocytes Relative: 35 %
Lymphs Abs: 2 10*3/uL (ref 0.7–4.0)
MCH: 29.1 pg (ref 26.0–34.0)
MCHC: 32.8 g/dL (ref 30.0–36.0)
MCV: 88.6 fL (ref 80.0–100.0)
Monocytes Absolute: 0.5 10*3/uL (ref 0.1–1.0)
Monocytes Relative: 9 %
Neutro Abs: 3.1 10*3/uL (ref 1.7–7.7)
Neutrophils Relative %: 53 %
Platelet Count: 168 10*3/uL (ref 150–400)
RBC: 3.68 MIL/uL — ABNORMAL LOW (ref 3.87–5.11)
RDW: 12.5 % (ref 11.5–15.5)
WBC Count: 5.7 10*3/uL (ref 4.0–10.5)
nRBC: 0 % (ref 0.0–0.2)

## 2021-04-13 LAB — CMP (CANCER CENTER ONLY)
ALT: 34 U/L (ref 0–44)
AST: 46 U/L — ABNORMAL HIGH (ref 15–41)
Albumin: 4 g/dL (ref 3.5–5.0)
Alkaline Phosphatase: 90 U/L (ref 38–126)
Anion gap: 11 (ref 5–15)
BUN: 12 mg/dL (ref 6–20)
CO2: 26 mmol/L (ref 22–32)
Calcium: 9.6 mg/dL (ref 8.9–10.3)
Chloride: 104 mmol/L (ref 98–111)
Creatinine: 0.75 mg/dL (ref 0.44–1.00)
GFR, Estimated: >60 mL/min (ref >60)
Glucose, Bld: 115 mg/dL — ABNORMAL HIGH (ref 70–99)
Potassium: 3.5 mmol/L (ref 3.5–5.1)
Sodium: 141 mmol/L (ref 135–145)
Total Bilirubin: 0.3 mg/dL (ref 0.3–1.2)
Total Protein: 7.2 g/dL (ref 6.5–8.1)

## 2021-04-13 MED ORDER — SODIUM CHLORIDE 0.9 % IV SOLN
3.0000 mg/kg | Freq: Once | INTRAVENOUS | Status: AC
  Administered 2021-04-13: 220 mg via INTRAVENOUS
  Filled 2021-04-13: qty 8

## 2021-04-13 MED ORDER — FAMOTIDINE 20 MG IN NS 100 ML IVPB
20.0000 mg | Freq: Once | INTRAVENOUS | Status: AC
  Administered 2021-04-13: 20 mg via INTRAVENOUS
  Filled 2021-04-13: qty 100

## 2021-04-13 MED ORDER — ONDANSETRON HCL 4 MG/2ML IJ SOLN
8.0000 mg | Freq: Once | INTRAMUSCULAR | Status: AC
Start: 2021-04-13 — End: 2021-04-13
  Administered 2021-04-13: 8 mg via INTRAVENOUS
  Filled 2021-04-13: qty 4

## 2021-04-13 MED ORDER — SODIUM CHLORIDE 0.9% FLUSH
10.0000 mL | INTRAVENOUS | Status: DC | PRN
  Administered 2021-04-13: 10 mL via INTRAVENOUS

## 2021-04-13 MED ORDER — SODIUM CHLORIDE 0.9 % IV SOLN: Freq: Once | INTRAVENOUS | Status: AC

## 2021-04-13 MED ORDER — ACETAMINOPHEN 325 MG PO TABS
650.0000 mg | ORAL_TABLET | Freq: Once | ORAL | Status: AC
  Administered 2021-04-13: 650 mg via ORAL
  Filled 2021-04-13: qty 2

## 2021-04-13 MED ORDER — HEPARIN SOD (PORK) LOCK FLUSH 100 UNIT/ML IV SOLN
500.0000 [IU] | Freq: Once | INTRAVENOUS | Status: AC | PRN
  Administered 2021-04-13: 500 [IU]

## 2021-04-13 MED ORDER — SODIUM CHLORIDE 0.9% FLUSH
10.0000 mL | INTRAVENOUS | Status: DC | PRN
  Administered 2021-04-13: 10 mL

## 2021-04-13 NOTE — Progress Notes (Signed)
Pt. declines to stay for 30 minute post observation, states she tolerates treatment without any issues. Vital signs stable at discharge. Left via ambulation, no shortness of breath noted.

## 2021-04-13 NOTE — Patient Instructions (Signed)
[facility] ONCOLOGY  Discharge Instructions: Thank you for choosing [facility] to provide your oncology and hematology care.   If you have a lab appointment with the [facility], please go directly to the [facility] and check in at the registration area.   Wear comfortable clothing and clothing appropriate for easy access to any Portacath or PICC line.   We strive to give you quality time with your provider. You may need to reschedule your appointment if you arrive late (15 or more minutes).  Arriving late affects you and other patients whose appointments are after yours.  Also, if you miss three or more appointments without notifying the office, you may be dismissed from the clinic at the provider's discretion.      For prescription refill requests, have your pharmacy contact our office and allow 72 hours for refills to be completed.    Today you received the following chemotherapy and/or immunotherapy agent: Trastuzumab (Kadcyla).   To help prevent nausea and vomiting after your treatment, we encourage you to take your nausea medication as directed.  BELOW ARE SYMPTOMS THAT SHOULD BE REPORTED IMMEDIATELY: *FEVER GREATER THAN 100.4 F (38 C) OR HIGHER *CHILLS OR SWEATING *NAUSEA AND VOMITING THAT IS NOT CONTROLLED WITH YOUR NAUSEA MEDICATION *UNUSUAL SHORTNESS OF BREATH *UNUSUAL BRUISING OR BLEEDING *URINARY PROBLEMS (pain or burning when urinating, or frequent urination) *BOWEL PROBLEMS (unusual diarrhea, constipation, pain near the anus) TENDERNESS IN MOUTH AND THROAT WITH OR WITHOUT PRESENCE OF ULCERS (sore throat, sores in mouth, or a toothache) UNUSUAL RASH, SWELLING OR PAIN  UNUSUAL VAGINAL DISCHARGE OR ITCHING   Items with * indicate a potential emergency and should be followed up as soon as possible or go to the Emergency Department if any problems should occur.  Please show the CHEMOTHERAPY ALERT CARD or IMMUNOTHERAPY ALERT CARD at  check-in to the Emergency Department and triage nurse.  Should you have questions after your visit or need to cancel or reschedule your appointment, please contact [facility]  Dept: [phone]  and follow the prompts.  Office hours are 8:00 a.m. to 4:30 p.m. Monday - Friday. Please note that voicemails left after 4:00 p.m. may not be returned until the following business day.  We are closed weekends and major holidays. You have access to a nurse at all times for urgent questions. Please call the main number to the clinic Dept: [phone] and follow the prompts.   For any non-urgent questions, you may also contact your provider using MyChart. We now offer e-Visits for anyone 6 and older to request care online for non-urgent symptoms. For details visit [email or web address].   Also download the MyChart app! Go to the app store, search "MyChart", open the app, select [facility], and log in with your MyChart username and password.  Due to Covid, a mask is required upon entering the hospital/clinic. If you do not have a mask, one will be given to you upon arrival. For doctor visits, patients may have 1 support person aged 20 or older with them. For treatment visits, patients cannot have anyone with them due to current Covid guidelines and our immunocompromised population.   Goserelin injection What is this medication? GOSERELIN (GOE se rel in) is similar to a hormone found in the body. It lowers the amount of sex hormones that the body makes. Men will have lower testosterone levels and women will have lower estrogen levels while taking this medicine. In men, this  medicine is used to treat prostate cancer; the injection is either given once per month or once every 12 weeks. A once per month injection (only) is used to treat women with endometriosis, dysfunctional uterine bleeding, or advanced breast cancer. This medicine may be used for other purposes; ask your health  care provider or pharmacist if you have questions. COMMON BRAND NAME(S): Zoladex What should I tell my care team before I take this medication? They need to know if you have any of these conditions: bone problems diabetes heart disease history of irregular heartbeat an unusual or allergic reaction to goserelin, other medicines, foods, dyes, or preservatives pregnant or trying to get pregnant breast-feeding How should I use this medication? This medicine is for injection under the skin. It is given by a health care professional in a hospital or clinic setting. Talk to your pediatrician regarding the use of this medicine in children. Special care may be needed. Overdosage: If you think you have taken too much of this medicine contact a poison control center or emergency room at once. NOTE: This medicine is only for you. Do not share this medicine with others. What if I miss a dose? It is important not to miss your dose. Call your doctor or health care professional if you are unable to keep an appointment. What may interact with this medication? Do not take this medicine with any of the following medications: cisapride dronedarone pimozide thioridazine This medicine may also interact with the following medications: other medicines that prolong the QT interval (an abnormal heart rhythm) This list may not describe all possible interactions. Give your health care provider a list of all the medicines, herbs, non-prescription drugs, or dietary supplements you use. Also tell them if you smoke, drink alcohol, or use illegal drugs. Some items may interact with your medicine. What should I watch for while using this medication? Visit your doctor or health care provider for regular checks on your progress. Your symptoms may appear to get worse during the first weeks of this therapy. Tell your doctor or healthcare provider if your symptoms do not start to get better or if they get worse after this  time. Your bones may get weaker if you take this medicine for a long time. If you smoke or frequently drink alcohol you may increase your risk of bone loss. A family history of osteoporosis, chronic use of drugs for seizures (convulsions), or corticosteroids can also increase your risk of bone loss. Talk to your doctor about how to keep your bones strong. This medicine should stop regular monthly menstruation in women. Tell your doctor if you continue to menstruate. Women should not become pregnant while taking this medicine or for 12 weeks after stopping this medicine. Women should inform their doctor if they wish to become pregnant or think they might be pregnant. There is a potential for serious side effects to an unborn child. Talk to your health care professional or pharmacist for more information. Do not breast-feed an infant while taking this medicine. Men should inform their doctors if they wish to father a child. This medicine may lower sperm counts. Talk to your health care professional or pharmacist for more information. This medicine may increase blood sugar. Ask your healthcare provider if changes in diet or medicines are needed if you have diabetes. What side effects may I notice from receiving this medication? Side effects that you should report to your doctor or health care professional as soon as possible: allergic reactions like skin  rash, itching or hives, swelling of the face, lips, or tongue bone pain breathing problems changes in vision chest pain feeling faint or lightheaded, falls fever, chills pain, swelling, warmth in the leg pain, tingling, numbness in the hands or feet signs and symptoms of high blood sugar such as being more thirsty or hungry or having to urinate more than normal. You may also feel very tired or have blurry vision signs and symptoms of low blood pressure like dizziness; feeling faint or lightheaded, falls; unusually weak or tired stomach pain swelling  of the ankles, feet, hands trouble passing urine or change in the amount of urine unusually high or low blood pressure unusually weak or tired Side effects that usually do not require medical attention (report to your doctor or health care professional if they continue or are bothersome): change in sex drive or performance changes in breast size in both males and females changes in emotions or moods headache hot flashes irritation at site where injected loss of appetite skin problems like acne, dry skin vaginal dryness This list may not describe all possible side effects. Call your doctor for medical advice about side effects. You may report side effects to FDA at 1-800-FDA-1088. Where should I keep my medication? This drug is given in a hospital or clinic and will not be stored at home. NOTE: This sheet is a summary. It may not cover all possible information. If you have questions about this medicine, talk to your doctor, pharmacist, or health care provider.  2022 Elsevier/Gold Standard (2018-09-22 14:05:56)

## 2021-04-13 NOTE — Progress Notes (Signed)
[facility] Follow up:    Natasha Rail, MD [place] [address]   DIAGNOSIS: Cancer Staging Breast cancer of upper-outer quadrant of left female breast [facility]) Staging form: Breast, AJCC 7th Edition - Clinical stage from 08/27/2014: Stage IIA (T2, N0, M0) - Unsigned Laterality: Left - Pathologic: T0, N0, cM0 - Unsigned Laterality: Left Staging form: Breast, AJCC 8th Edition - Pathologic stage from 08/12/2020: Stage IA (pT1b, pN0, cM0, G2, ER+, PR-, HER2+) - Signed by Gardenia Phlegm, NP on 08/24/2020 Stage prefix: Initial diagnosis Histologic grading system: 3 grade system   SUMMARY OF ONCOLOGIC HISTORY: Oncology History  Breast cancer of upper-outer quadrant of left female breast ([place])  08/13/2014 Mammogram   Left breast: 2 cm circumscribed mass   08/13/2014 Breast US   Left breast: two masses: #1: 1:00 10 x 9 x 13 mmm irregular; #2: 2:00: 16 x 7 x 14 mm; no LAD   08/27/2014 Initial Diagnosis   O/S excisional biopsy: 2 masses showing 1.5 cm and 0.9 cm IDC, Grade 3, ER + (90%), PR- (0%), HER-2 positive (ratio 2.5), Ki67 ~30%, Multifocal   08/27/2014 Clinical Stage   Stage IIA/IIB: T2/T3 N0   09/17/2014 Procedure   MyRisk panel ([employer]) 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 Otherwise negative at APC, ATM, BARD1, BMPR1A, BRCA1, BRCA2, BRIP1, CHD1, CDK4, CDKN2A, CHEK2, MLH1, MSH2, MSH6, MUTYH, NBN, PALB2, PMS2, PTEN, RAD51C, RAD51D, SMAD4, STK11, and TP53   09/20/2014 Breast MRI   RIGHT: 1 x 0.8 x 0.9 cm lobulated border mass in the retroareolar lower slight lateral area. LEFT: hematoma with surrounding adjacent enhancement encompassing a 4.6 x 4.9 x 4.1 cm area.    09/27/2014 - 01/10/2015 Neo-Adjuvant Chemotherapy   Neoadjuvant TCH Perjeta every 3 week 6 followed by Herceptin maintenance completed 09/21/2015   01/13/2015 Breast MRI   Postsurgical changes in left breast without residual enhancing masses compatible with treatment  response   02/10/2015 Definitive Surgery   Bilateral mastectomies: RIGHT: benign.  LEFT: complete path response;  0/3 sentinel nodes   02/10/2015 Pathologic Stage   ypT0 ypN0 ypM0   02/22/2015 -  Anti-estrogen oral therapy   Zoladex with tamoxifen, stopped Zoladex for intolerance, decreased tamoxifen to 10 mg daily.   11/17/2015 Survivorship   Survivorship care plan completed and given to patient    Relapse/Recurrence   Within the upper-outer left breast anterior depth there are 2 adjacent irregular enhancing masses (2.1 cm and 1.5 cm) compatible with biopsy-proven malignancy. Indeterminate enhancing mass within the outer left breast posterior depth (1.8 cm). Indeterminate enhancing mass within the outer lower posterior right breast. 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 ER 80%, PR 0%, Her 2 Positive   08/12/2020 Cancer Staging   Staging form: Breast, AJCC 8th Edition - Pathologic stage from 08/12/2020: Stage IA (pT1b, pN0, cM0, G2, ER+, PR-, HER2+) - Signed by Gardenia Phlegm, NP on 08/24/2020 Stage prefix: Initial diagnosis Histologic grading system: 3 grade system    09/07/2020 -  Chemotherapy   Patient is on Treatment Plan : BREAST ADO-Trastuzumab Emtansine ([facility]) q21d     11/29/2020 Surgery   Left anterior lumpectomy: Benign, left posterior lumpectomy: Benign, left posterior tissue lateral margin excision: Benign with 3 intramammary lymph nodes negative for malignancy.  Left posterior tissue superior margin: Benign     CURRENT THERAPY: Kadcyla  INTERVAL HISTORY: Natasha Torres 40 y.o. female returns for evaluation prior to  receiving Kadcyla.  She is doing well today.  She is scheduled for repeat echo tomorrow with Dr. Aundra Dubin.  Ople is doing well today.  Her activity level is stable.  She is not experiencing fatigue.  She denies fever, chills, cough, shortness of breath, chest pain, palpitations, or any new concerns.  She  did note mild aching since the weather changed.   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  MEDICAL HISTORY: Past Medical History:  Diagnosis Date   Anemia    takes iron supplement   Anxiety    Family history of adverse reaction to anesthesia    pt's mother has hx. of post-op N/V   History of breast cancer 2016   left breast ca 08/2014   Recurrent breast cancer, left ([place]) dx'd 07/2020   Tattoo of skin 06/07/2016   new tattoo right ankle    SURGICAL HISTORY: Past Surgical History:  Procedure Laterality Date   BREAST LUMPECTOMY Left 08/2014   BREAST RECONSTRUCTION WITH PLACEMENT OF TISSUE EXPANDER AND FLEX HD (ACELLULAR HYDRATED DERMIS) Bilateral 02/10/2015   Procedure: BREAST RECONSTRUCTION WITH PLACEMENT OF TISSUE EXPANDER AND FLEX HD (ACELLULAR HYDRATED DERMIS);  Surgeon: Irene Limbo, MD;  Location: [facility];  Service: Plastics;  Laterality: Bilateral;   INCISION AND DRAINAGE OF WOUND Left 03/02/2015   Procedure: IRRIGATION BREAST POCKET AND EXCHANGE OF LEFT BREAST TISSUE EXPANDER;  Surgeon: Irene Limbo, MD;  Location: [facility];  Service: Plastics;  Laterality: Left;   LIPOSUCTION WITH LIPOFILLING Bilateral 06/07/2015   Procedure: LIPOSUCTION WITH LIPOFILLING TO BILATERAL CHEST;  Surgeon: Irene Limbo, MD;  Location: [facility];  Service: Plastics;  Laterality: Bilateral;   LIPOSUCTION WITH LIPOFILLING Bilateral 09/23/2015   Procedure: LIPOFILLING  FROM BILATERAL THIGHS TO BILATERAL CHEST ;  Surgeon: Irene Limbo, MD;  Location: [facility];  Service: Plastics;  Laterality: Bilateral;   LIPOSUCTION WITH LIPOFILLING Bilateral 06/15/2016   Procedure: LIPOFILLING FROM BILATERAL THIGH TO BILATERAL CHEST;  Surgeon: Irene Limbo, MD;  Location: [facility];  Service: Plastics;  Laterality: Bilateral;  LIPOFILLING FROM BILATERAL THIGH TO BILATERAL CHEST   MASTECTOMY Bilateral 02/10/2015   NIPPLE SPARING MASTECTOMY/SENTINAL LYMPH NODE BIOPSY/RECONSTRUCTION/PLACEMENT OF TISSUE EXPANDER Bilateral 02/10/2015   Procedure: BILATERAL  NIPPLE SPARING MASTECTOMY WITH LEFT  SENTINAL LYMPH NODE BIOPSY(RIGHT BREAST PROPHYLACTIC);  Surgeon: Rolm Bookbinder, MD;  Location: [facility];  Service: General;  Laterality: Bilateral;   ORIF FINGER / THUMB FRACTURE Right 1994   thumb   PORT-A-CATH REMOVAL  01/07/2015   removed and replaced   PORT-A-CATH REMOVAL Right 09/23/2015   Procedure: REMOVAL PORT-A-CATH;  Surgeon: Irene Limbo, MD;  Location: [facility];  Service: Plastics;  Laterality: Right;   PORTACATH PLACEMENT  08/2014; 01/07/2015   PORTACATH PLACEMENT Right 09/06/2020   Procedure: INSERTION PORT-A-CATH;  Surgeon: Rolm Bookbinder, MD;  Location: [facility];  Service: General;  Laterality: Right;   RADIOACTIVE SEED GUIDED EXCISIONAL BREAST BIOPSY Left 11/29/2020   Procedure: RADIOACTIVE SEED GUIDED EXCISION OF  LEFT BREAST CANCER x2;  Surgeon: Rolm Bookbinder, MD;  Location: [facility];  Service: General;  Laterality: Left;   REMOVAL OF BILATERAL TISSUE EXPANDERS WITH PLACEMENT OF BILATERAL BREAST IMPLANTS Bilateral 06/07/2015   Procedure: REMOVAL OF BILATERAL TISSUE EXPANDERS WITH PLACEMENT OF BILATERAL BREAST  SILICONE IMPLANTS;  Surgeon: Irene Limbo, MD;  Location: [facility];  Service: Plastics;  Laterality: Bilateral;   REMOVAL OF TISSUE EXPANDER AND PLACEMENT OF IMPLANT Right  02/27/2015   Procedure: REMOVAL OF TISSUE EXPANDER AND PLACEMENT OF NEW TISSUE EXPANDER;  Surgeon: Irene Limbo, MD;  Location: [facility];  Service: Plastics;  Laterality: Right;   SALPINGOOPHORECTOMY Right 11/22/2010   TISSUE EXPANDER PLACEMENT Left 03/02/2015   Procedure: TISSUE EXPANDER;  Surgeon: Irene Limbo, MD;  Location: [facility];  Service: Plastics;  Laterality: Left;   WISDOM TOOTH EXTRACTION  2001    SOCIAL HISTORY: Social History   Socioeconomic History   Marital status: Divorced    Spouse name: Harrell Gave   Number of children: 1   Years of education: Not on file   Highest education level: Not on file  Occupational History   Not on file  Tobacco Use   Smoking status: Never   Smokeless tobacco: Never  Vaping Use   Vaping Use: Never used  Substance and Sexual Activity   Alcohol use: Yes    Alcohol/week: 0.0 standard drinks    Comment: Occasional   Drug use: No   Sexual activity: Not Currently  Other Topics Concern   Not on file  Social History Narrative   ** Merged History Encounter **       Social Determinants of Health   Financial Resource Strain: Not on file  Food Insecurity: Not on file  Transportation Needs: Not on file  Physical Activity: Not on file  Stress: Not on file  Social Connections: Not on file  Intimate Partner Violence: Not on file    FAMILY HISTORY: Family History  Problem Relation Age of Onset   Hypertension Mother    Anesthesia problems Mother        post-op N/V   Prostate cancer Father 41   Depression Maternal Aunt    Dementia Maternal Grandmother    Cancer Maternal Grandfather    Heart attack Maternal Grandfather    Dementia Paternal Grandmother    Kidney cancer Paternal Grandmother        slow growing, no treatment   Cancer Paternal Grandfather    Prostate cancer Paternal Grandfather 30   Bone cancer Paternal Grandfather 70   Breast cancer Paternal Grandfather 22   Lung cancer Paternal Grandfather        dx late 17s;  smoker.  thought to be a 4th primary cancer    Review of Systems  Constitutional:  Negative for appetite change, chills, fatigue, fever and unexpected weight change.  HENT:   Negative for hearing loss, lump/mass and trouble swallowing.   Eyes:  Negative for eye problems and icterus.  Respiratory:  Negative for chest tightness, cough and shortness of breath.   Cardiovascular:  Negative for chest pain, leg swelling and palpitations.  Gastrointestinal:  Negative for abdominal distention, abdominal pain, constipation, diarrhea, nausea and vomiting.  Endocrine: Negative for hot flashes.  Genitourinary:  Negative for difficulty urinating.   Musculoskeletal:  Negative for arthralgias.  Skin:  Negative for itching and rash.  Neurological:  Negative for dizziness, extremity weakness, headaches and numbness.  Hematological:  Negative for adenopathy. Does not bruise/bleed easily.  Psychiatric/Behavioral:  Negative for depression. The patient is not nervous/anxious.      PHYSICAL EXAMINATION  ECOG PERFORMANCE STATUS: 1 - Symptomatic but completely ambulatory  There were no vitals filed for this visit.  Physical Exam Constitutional:      General: She is not in acute distress.    Appearance: Normal appearance. She is not toxic-appearing.  HENT:     Head: Normocephalic and atraumatic.  Eyes:     General: No scleral icterus. Cardiovascular:     Rate and Rhythm: Normal rate and regular rhythm.     Pulses: Normal pulses.     Heart sounds: Normal heart sounds.  Pulmonary:     Effort: Pulmonary effort is normal.     Breath sounds: Normal breath sounds.  Abdominal:     General: Abdomen is flat. Bowel sounds are normal. There is no distension.     Palpations: Abdomen is soft.     Tenderness: There is no abdominal tenderness.  Musculoskeletal:        General: No swelling.     Cervical back: Neck supple.  Lymphadenopathy:     Cervical: No cervical adenopathy.  Skin:    General: Skin is warm  and dry.     Findings: No rash.  Neurological:     General: No focal deficit present.     Mental Status: She is alert.  Psychiatric:        Mood and Affect: Mood normal.        Behavior: Behavior normal.    LABORATORY DATA:  CBC    Component Value Date/Time   WBC 5.6 03/23/2021 1406   WBC 3.5 (L) 09/01/2020 0817   RBC 3.74 (L) 03/23/2021 1406   HGB 11.0 (L) 03/23/2021 1406   HGB 12.4 11/20/2016 0936   HCT 33.2 (L) 03/23/2021 1406   HCT 37.4 11/20/2016 0936   PLT 169 03/23/2021 1406   PLT 172 11/20/2016 0936   MCV 88.8 03/23/2021 1406   MCV 91.5 11/20/2016 0936   MCH 29.4 03/23/2021 1406   MCHC 33.1 03/23/2021 1406   RDW 12.2 03/23/2021 1406   RDW 13.1 11/20/2016 0936   LYMPHSABS 2.5 03/23/2021 1406   LYMPHSABS 1.6 11/20/2016 0936   MONOABS 0.5 03/23/2021 1406   MONOABS 0.5 11/20/2016 0936   EOSABS 0.1 03/23/2021 1406   EOSABS 0.1 11/20/2016 0936   BASOSABS 0.1 03/23/2021 1406   BASOSABS 0.0 11/20/2016 0936    CMP     Component Value Date/Time   NA 141 03/23/2021 1406   NA 140 11/20/2016 0936   K 3.6 03/23/2021 1406   K 4.3 11/20/2016 0936   CL 106 03/23/2021 1406   CO2 27 03/23/2021 1406   CO2 24 11/20/2016 0936   GLUCOSE 100 (H) 03/23/2021 1406   GLUCOSE 84 11/20/2016 0936   BUN 7 03/23/2021 1406   BUN 11.8 11/20/2016 0936   CREATININE 0.76 03/23/2021 1406   CREATININE 0.8 11/20/2016 0936   CALCIUM 9.3 03/23/2021 1406   CALCIUM 9.6 11/20/2016 0936   PROT 7.1 03/23/2021 1406   PROT 6.9 11/20/2016 0936   ALBUMIN 3.9 03/23/2021 1406   ALBUMIN 3.9 11/20/2016 0936   AST 43 (H) 03/23/2021 1406   AST 29 11/20/2016 0936   ALT 35 03/23/2021 1406   ALT 15 11/20/2016 0936   ALKPHOS 92 03/23/2021 1406   ALKPHOS 51 11/20/2016 0936   BILITOT 0.3 03/23/2021 1406   BILITOT 0.44 11/20/2016 0936   GFRNONAA >60 03/23/2021 1406   GFRAA >60 02/27/2015 1135  PENDING LABS:   RADIOGRAPHIC STUDIES:  No results found.   PATHOLOGY:     ASSESSMENT  and THERAPY PLAN:   No problem-specific Assessment & Plan notes found for this encounter.   No orders of the defined types were placed in this encounter.   All questions were answered. The patient knows to call the clinic with any problems, questions or concerns. We can certainly see the patient much sooner if necessary. This note was electronically signed. Scot Dock, NP 04/13/2021

## 2021-04-13 NOTE — Assessment & Plan Note (Signed)
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 Surgery 11/29/2020: Pathologic complete response 3. Followed by Jeanie Cooks to discuss the pros and cons of radiation given ATM mutation) 4.Further adjuvant antiestrogen therapy (ovarian suppression or oophorectomy plus Letrozole) Adding Zoladex monthly to her treatment plan ------------------------------------------------------------------ Treatment plan: Continue maintenance Kadcyla for 1 year. Antiestrogen therapy with Zoladex with Letrozole Echo on 6/10: EF 61%  Kadcyla toxicities: Elevated liver enzymes: resolved since dose reduction Fatigue: improved since dose reduction Abnormal heart strain: repeat echo  And f/u with Dr. Aundra Dubin  She will proceed with kadcyla today so long as cmet within parameters.  She will see Dr. Lindi Adie in 3 weeks for labs, f/u, and her next treatment.

## 2021-04-14 ENCOUNTER — Ambulatory Visit (HOSPITAL_BASED_OUTPATIENT_CLINIC_OR_DEPARTMENT_OTHER)
Admission: RE | Admit: 2021-04-14 | Discharge: 2021-04-14 | Disposition: A | Discharge status: Home / Self Care | Payer: BC Managed Care – PPO | Source: Ambulatory Visit | Attending: Cardiology | Admitting: Cardiology

## 2021-04-14 ENCOUNTER — Ambulatory Visit (HOSPITAL_COMMUNITY)
Admission: RE | Admit: 2021-04-14 | Discharge: 2021-04-14 | Disposition: A | Discharge status: Home / Self Care | Payer: BC Managed Care – PPO | Source: Ambulatory Visit | Attending: Cardiology | Admitting: Cardiology

## 2021-04-14 VITALS — BP 100/54 | HR 50 | Wt 162.5 lb

## 2021-04-14 DIAGNOSIS — Z9012 Acquired absence of left breast and nipple: Secondary | ICD-10-CM | POA: Insufficient documentation

## 2021-04-14 DIAGNOSIS — I429 Cardiomyopathy, unspecified: Secondary | ICD-10-CM | POA: Diagnosis not present

## 2021-04-14 DIAGNOSIS — Z803 Family history of malignant neoplasm of breast: Secondary | ICD-10-CM | POA: Insufficient documentation

## 2021-04-14 DIAGNOSIS — Z8249 Family history of ischemic heart disease and other diseases of the circulatory system: Secondary | ICD-10-CM | POA: Insufficient documentation

## 2021-04-14 DIAGNOSIS — Z853 Personal history of malignant neoplasm of breast: Secondary | ICD-10-CM | POA: Diagnosis not present

## 2021-04-14 DIAGNOSIS — Z79899 Other long term (current) drug therapy: Secondary | ICD-10-CM | POA: Diagnosis not present

## 2021-04-14 DIAGNOSIS — Z01818 Encounter for other preprocedural examination: Secondary | ICD-10-CM | POA: Diagnosis not present

## 2021-04-14 DIAGNOSIS — Z0189 Encounter for other specified special examinations: Secondary | ICD-10-CM

## 2021-04-14 DIAGNOSIS — Z801 Family history of malignant neoplasm of trachea, bronchus and lung: Secondary | ICD-10-CM | POA: Insufficient documentation

## 2021-04-14 DIAGNOSIS — Z170 Estrogen receptor positive status [ER+]: Secondary | ICD-10-CM | POA: Insufficient documentation

## 2021-04-14 DIAGNOSIS — Z79620 Long term (current) use of immunosuppressive biologic: Secondary | ICD-10-CM | POA: Diagnosis not present

## 2021-04-14 DIAGNOSIS — C50412 Malignant neoplasm of upper-outer quadrant of left female breast: Secondary | ICD-10-CM

## 2021-04-14 DIAGNOSIS — I340 Nonrheumatic mitral (valve) insufficiency: Secondary | ICD-10-CM | POA: Insufficient documentation

## 2021-04-14 LAB — ECHOCARDIOGRAM COMPLETE
Area-P 1/2: 2.13 cm2
S' Lateral: 3.2 cm

## 2021-04-14 NOTE — Patient Instructions (Signed)
Your physician recommends that you schedule a follow-up appointment in: 2 months with an echocardiogram  If you have any questions or concerns before your next appointment please send [place] a message through [place] or call our office at [phone].    TO LEAVE A MESSAGE FOR THE NURSE SELECT OPTION 2, PLEASE LEAVE A MESSAGE INCLUDING: YOUR NAME DATE OF BIRTH CALL BACK NUMBER REASON FOR CALL**this is important as we prioritize the call backs  YOU WILL RECEIVE A CALL BACK THE SAME DAY AS LONG AS YOU CALL BEFORE 4:00 PM

## 2021-04-15 NOTE — Progress Notes (Signed)
P  Advanced Heart Failure Clinic Note   Patient ID: Natasha Torres, female   DOB: 1981-05-05, 40 y.o.   MRN: [id number] Primary oncologist: Dr Lindi Adie HF: Dr Aundra Dubin.  40 y.o. with left breast cancer presents for evaluation of fall in EF and strain by echo monitoring.  Breast cancer was diagnosed in 2/16.  ER+/PR-/HER2+.  Treated with neoadjuvant chemo (carboplatin, docetaxel, Herceptin, and pertuzumab).  She was continued on Herceptin for a year.  She is s/p mastectomy.  She had an echo in 4/16 showed EF 55-60% with GLS -25%.  However, repeat echo in 6/16 during Herceptin treatment showed fall in EF to 50-55% with GLS fallen to -17%.  Echo in 8/16 showed EF down to 45%.  At that time, Herceptin was held for 6 weeks and later restarted.  Echo 4/17 showed EF 55-60% with GLS -24.7%.  She was able to complete her year of Herceptin with use of candesartan and Coreg.   In 2/22, she had a left-sided relapse, ER+/PR-/HER2+.  She was started on Kadcyla and had left lumpectomy. Plan for [place] until 12/22.  Initial echo in 6/22 showed EF 61% with GLS -19.4%.  Repeat echo in 9/22, however, showed EF 60-65%, GLS -15.1% with normal RV (less negative strain).   Echo was done today and reviewed, EF 35-46%, normal diastolic function, normal RV, GLS -24.2%.    No complaints, no dyspnea or chest pain.  She does not get lightheaded.  She exercises regularly.  Works as Dietitian at [employer].    Labs (7/16): K 4, creatinine 0.7 Labs (8/16): K 4.3, creatinine 0.64 Labs (9/16): K 3.7, creatinine 0.81 Labs (11/16): K 4.1, creatinine 0.7 Labs (4/17): K 4, creatinine 0.8 Labs (9/22): K 3.5, creatinine 0.77  PMH: 1. Breast cancer: On left, diagnosed 2/16.  ER+/PR-/HER2+.  Treated with neoadjuvant chemo (carboplatin, docetaxel, Herceptin, and pertuzumab).  She completed this treatment and finished 1 year Herceptin.  S/p mastectomy. She had a left-sided recurrence in 2/22, ER+/PR-/HER2+.  She started on  Kadcyla and had 6/22 left anterior lumpectomy.  2. 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  - Echo (4/16) with EF 55-60%, GLS -25%. - Echo (6/16) with EF 50-55%, GLS -17% - Echo (8/16) with EF 45%, GLS -18.6%, lateral s' 13.5 cm/sec - Echo (9/16) with EF 50-55%, no strain done, lateral s' 13.1 cm/sec - Echo (11/16) with EF 50%, GLS -15.8% (poor signal), lateral s' 14.3 cm/sec - Echo (1/17) with EF 55%, GLS -56.8%, normal diastolic function, normal RV size and systolic function.  - Echo (4/17) with EF 12-75%, normal diastolic function, GLS -17.0%.  - Echo (10/17) with EF 01%, normal diastolic function, GLS -74.9%, normal RV size and systolic function.  - Echo (6/22) with EF 61%, GLS -19.4% - Echo (9/22) with EF 60-65%, GLS -15.1%, RV normal.  - Echo (10/22) with EF 44-96%, normal diastolic function, normal RV, GLS -24.2%  Social History   Socioeconomic History   Marital status: Divorced    Spouse name: Harrell Gave   Number of children: 1   Years of education: Not on file   Highest education level: Not on file  Occupational History   Not on file  Tobacco Use   Smoking status: Never   Smokeless tobacco: Never  Vaping Use   Vaping Use: Never used  Substance and Sexual Activity   Alcohol use: Yes    Alcohol/week: 0.0 standard drinks    Comment: Occasional   Drug use: No   Sexual activity: Not Currently  Other Topics Concern   Not on file  Social History Narrative   ** Merged History Encounter **       Social Determinants of Health   Financial Resource Strain: Not on file  Food Insecurity: Not on file  Transportation Needs: Not on file  Physical Activity: Not on file  Stress: Not on file  Social Connections: Not on file   Family History  Problem Relation Age of Onset   Hypertension Mother    Anesthesia problems Mother        post-op N/V   Prostate cancer Father 92   Depression Maternal Aunt    Dementia Maternal Grandmother    Cancer Maternal Grandfather     Heart attack Maternal Grandfather    Dementia Paternal Grandmother    Kidney cancer Paternal Grandmother        slow growing, no treatment   Cancer Paternal Grandfather    Prostate cancer Paternal Grandfather 56   Bone cancer Paternal Grandfather 8   Breast cancer Paternal Grandfather 27   Lung cancer Paternal Grandfather        dx late 85s; smoker.  thought to be a 4th primary cancer   ROS: All systems reviewed and negative except as per HPI.  Current Outpatient Medications  Medication Sig Dispense Refill   Ado-Trastuzumab Emtansine (KADCYLA IV) Kadcyla  q 3 wks     Ashwagandha 500 MG CAPS Take by mouth. Patient takes at bedtime     bisoprolol (ZEBETA) 5 MG tablet Take 0.5 tablets (2.5 mg total) by mouth at bedtime. 15 tablet 3   Black Pepper-Turmeric 3-500 MG CAPS Take by mouth.     candesartan (ATACAND) 4 MG tablet TAKE 1 TABLET BY MOUTH AT BEDTIME. 90 tablet 1   Cholecalciferol (VITAMIN D3) 50 MCG (2000 UT) TABS Take 2,000 Units by mouth at bedtime.     Cyanocobalamin (B-12) 100 MCG TABS Take by mouth. Patient takes 1 tablet at lunch time.     Ferrous Sulfate (IRON) 325 (65 Fe) MG TABS Take by mouth. Patient takes 1 tablet at lunch time.     folic acid (FOLVITE) 976 MCG tablet Take 400 mcg by mouth daily.     letrozole (FEMARA) 2.5 MG tablet Take 1 tablet (2.5 mg total) by mouth daily. 90 tablet 3   lidocaine-prilocaine (EMLA) cream Apply to affected area once 30 g 3   LORazepam (ATIVAN) 0.5 MG tablet Take 1 tablet (0.5 mg total) by mouth every 6 (six) hours as needed for anxiety. (Patient taking differently: Take 0.5 mg by mouth. Patient takes 1 tablet once a day.) 30 tablet 3   Magnesium Hydroxide 400 MG CHEW Chew 400 mg by mouth at bedtime.     Multiple Vitamins-Minerals (ADULT ONE DAILY GUMMIES PO) Take 2 tablets by mouth at bedtime.     NON FORMULARY Echinacea Tincture PRN     ondansetron (ZOFRAN) 8 MG tablet Take 1 tablet (8 mg total) by mouth every 8 (eight) hours as  needed for nausea or vomiting. 30 tablet 1   prochlorperazine (COMPAZINE) 10 MG tablet Take 1 tablet (10 mg total) by mouth every 6 (six) hours as needed for nausea or vomiting. 30 tablet 1   Turmeric 500 MG CAPS Take 500 mg by mouth at bedtime. (Patient taking differently: Take 500 mg by mouth daily.)     No current facility-administered medications for this encounter.   BP (!) 100/54   Pulse (!) 50   Wt 73.7 kg (162 lb 8  oz)   LMP 08/28/2020   SpO2 100%   BMI 22.66 kg/m - She can stop candesartan.  General: NAD Neck: No JVD, no thyromegaly or thyroid nodule.  Lungs: Clear to auscultation bilaterally with normal respiratory effort. CV: Nondisplaced PMI.  Heart regular S1/S2, no S3/S4, no murmur.  No peripheral edema.  No carotid bruit.  Normal pedal pulses.  Abdomen: Soft, nontender, no hepatosplenomegaly, no distention.  Skin: Intact without lesions or rashes.  Neurologic: Alert and oriented x 3.  Psych: Normal affect. Extremities: No clubbing or cyanosis.  HEENT: Normal.   40 y.o. with history of breast cancer.  She had a transient fall in EF and strain with Herceptin back in 2017.  She was started candesartan and Coreg, and Herceptin was held.  EF improved back to normal range and she completed Herceptin therapy.  She now has a relapse and is going to be on Kadcyla until 12/22.  Strain was significantly less negative on 9/22 echo though EF remained normal.  I am concerned that the less negative strain is due to [place].  I started her back on bisoprolol and candesartan.  Repeat echo today showed normal EF and strain now normal.   - Continue bisoprolol 2.5 qhs and candesartan 4 mg qhs (take in evening to have less effect on BP during the day) until she completes Kadcyla.    - Repeat echo in 2-3 months after she finishes Kadcyla, if still normal can stop bisoprolol and candesartan.    Loralie Champagne  04/15/2021

## 2021-04-26 ENCOUNTER — Encounter: Payer: Self-pay | Admitting: Hematology and Oncology

## 2021-04-27 ENCOUNTER — Other Ambulatory Visit: Payer: Self-pay

## 2021-04-27 ENCOUNTER — Inpatient Hospital Stay: Payer: BC Managed Care – PPO | Attending: Hematology and Oncology

## 2021-04-27 VITALS — BP 114/60 | HR 58 | Temp 98.4°F | Resp 16

## 2021-04-27 DIAGNOSIS — C50412 Malignant neoplasm of upper-outer quadrant of left female breast: Secondary | ICD-10-CM | POA: Insufficient documentation

## 2021-04-27 DIAGNOSIS — Z5112 Encounter for antineoplastic immunotherapy: Secondary | ICD-10-CM | POA: Diagnosis not present

## 2021-04-27 DIAGNOSIS — Z170 Estrogen receptor positive status [ER+]: Secondary | ICD-10-CM | POA: Diagnosis not present

## 2021-04-27 MED ORDER — GOSERELIN ACETATE 3.6 MG ~~LOC~~ IMPL
3.6000 mg | DRUG_IMPLANT | Freq: Once | SUBCUTANEOUS | Status: AC
  Administered 2021-04-27: 3.6 mg via SUBCUTANEOUS
  Filled 2021-04-27: qty 3.6

## 2021-04-28 ENCOUNTER — Ambulatory Visit: Payer: BC Managed Care – PPO

## 2021-05-03 NOTE — Progress Notes (Signed)
HEMATOLOGY-ONCOLOGY TELEPHONE VISIT PROGRESS NOTE  I connected with [name] on 05/04/2021 at  2:00 PM EST by telephone and verified that I am speaking with the correct person using two identifiers.  I discussed the limitations, risks, security and privacy concerns of performing an evaluation and management service by telephone and the availability of in person appointments.  I also discussed with the patient that there may be a patient responsible charge related to this service. The patient expressed understanding and agreed to proceed.   History of Present Illness: Natasha Torres is a 40 y.o. female with above-mentioned history of recurrent left breast cancer currently on chemotherapy with Kadcyla. She presents via telephone today for follow-up.   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 Otherwise negative at APC, ATM, BARD1, BMPR1A, BRCA1, BRCA2, BRIP1, CHD1, CDK4, CDKN2A, CHEK2, MLH1, MSH2, MSH6, MUTYH, NBN, PALB2, PMS2, PTEN, RAD51C, RAD51D, SMAD4, STK11, and TP53   09/20/2014 Breast MRI   RIGHT: 1 x 0.8 x 0.9 cm lobulated border mass in the retroareolar lower slight lateral area. LEFT: hematoma with surrounding adjacent enhancement encompassing a 4.6 x 4.9 x 4.1 cm area.    09/27/2014 - 01/10/2015 Neo-Adjuvant Chemotherapy   Neoadjuvant TCH Perjeta every 3 week 6 followed by Herceptin maintenance completed 09/21/2015   01/13/2015 Breast MRI    Postsurgical changes in left breast without residual enhancing masses compatible with treatment response   02/10/2015 Definitive Surgery   Bilateral mastectomies: RIGHT: benign.  LEFT: complete path response;  0/3 sentinel nodes   02/10/2015 Pathologic Stage   ypT0 ypN0 ypM0   02/22/2015 -  Anti-estrogen oral therapy   Zoladex with tamoxifen, stopped Zoladex for intolerance, decreased tamoxifen to 10 mg daily.   11/17/2015 Survivorship   Survivorship care plan completed and given to patient    Relapse/Recurrence   Within the upper-outer left breast anterior depth there are 2 adjacent irregular enhancing masses (2.1 cm and 1.5 cm) compatible with biopsy-proven malignancy. Indeterminate enhancing mass within the outer left breast posterior depth (1.8 cm). Indeterminate enhancing mass within the outer lower posterior right breast. 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 ER 80%, PR 0%, Her 2 Positive   08/12/2020 Cancer Staging   Staging form: Breast, AJCC 8th Edition - Pathologic stage from 08/12/2020: Stage IA (pT1b, pN0, cM0, G2, ER+, PR-, HER2+) - Signed by Gardenia Phlegm, NP on 08/24/2020 Stage prefix: Initial diagnosis Histologic grading system: 3 grade system    09/07/2020 -  Chemotherapy   Patient is on Treatment Plan : BREAST ADO-Trastuzumab Emtansine ([place]) q21d     11/29/2020 Surgery   Left anterior lumpectomy: Benign, left posterior lumpectomy: Benign, left posterior tissue lateral margin excision: Benign with 3 intramammary lymph nodes negative for malignancy.  Left posterior tissue superior margin: Benign     Observations/Objective:  Assessment Plan:  Breast cancer of upper-outer quadrant of left female breast 08/13/2014: Left breast IDC. Neo adj chemo with TCHP foll by Herceptin maintenance (path CR: Bil mastectomies and reconstruction), Tamoxifen (stoped and started due to intolerance) ATM gene mutation   08/12/20  Relapsed Breast Cancer: Left Breast UOQ 2.1 cm and 1.5 cm. Indeterminate 1.8 cm and Right breast indeterminate 1.5 cm.  Biopsy revealed IDC with DCIS, ER 80%, PR 0%, HER-2 3+ IHC positive   Bone scans 08/22/2020: Results awaited CT scans: scheduled for 08/24/20 Left breast Biopsy 3 o clock:Grade 2-3 IMC with lobular features ER 60%, PR 10%, Ki-67 20%, HER-2 3+ Rt Breast Biopsy: Benign   Treatment plan: 1.  chemotherapy with Kadcyla for currently unresectable breast cancer 4 cycles completed 11/10/2020 2. Surgery 11/29/2020: Pathologic complete response 3. Followed by XRT (have to discuss the pros and cons of radiation given ATM mutation) 4. Further adjuvant antiestrogen therapy (ovarian suppression or oophorectomy plus Letrozole) Zoladex monthly to her treatment plan ------------------------------------------------------------------ Treatment plan: Continue maintenance Kadcyla for 1 year (started 09/07/2020). Antiestrogen therapy with Zoladex with Letrozole Echo on 6/10: EF 61%   Kadcyla toxicities: Elevated liver enzymes: resolved since dose reduction Fatigue: Severe fatigue.  Therefore we reduced the dosage of Kadcyla further today. Abnormal heart strain: repeat echo  And f/u with Dr. Aundra Dubin Anxiety: She is very anxious about recurrence of breast cancer  Return to clinic in January 2023 to discuss Neratinib treatment.    I discussed the assessment and treatment plan with the patient. The patient was provided an opportunity to ask questions and all were answered. The patient agreed with the plan and demonstrated an understanding of the instructions. The patient was advised to call back or seek an in-person evaluation if the symptoms worsen or if the condition fails to improve as anticipated.   Total time spent: 15 mins including non-face to face time and time spent for planning, charting and coordination of care  Rulon Eisenmenger, MD 05/04/2021    I, Thana Ates, am acting as scribe for  Nicholas Lose, MD.  I have reviewed the above documentation for accuracy and completeness, and I agree with the above.

## 2021-05-04 ENCOUNTER — Inpatient Hospital Stay (HOSPITAL_BASED_OUTPATIENT_CLINIC_OR_DEPARTMENT_OTHER): Payer: BC Managed Care – PPO | Admitting: Hematology and Oncology

## 2021-05-04 ENCOUNTER — Ambulatory Visit: Payer: BC Managed Care – PPO

## 2021-05-04 ENCOUNTER — Other Ambulatory Visit: Payer: BC Managed Care – PPO

## 2021-05-04 DIAGNOSIS — C50412 Malignant neoplasm of upper-outer quadrant of left female breast: Secondary | ICD-10-CM | POA: Diagnosis not present

## 2021-05-04 DIAGNOSIS — Z170 Estrogen receptor positive status [ER+]: Secondary | ICD-10-CM

## 2021-05-04 NOTE — Assessment & Plan Note (Addendum)
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 Surgery 11/29/2020: Pathologic complete response 3. Followed by Jeanie Cooks to discuss the pros and cons of radiation given ATM mutation) 4.Further adjuvant antiestrogen therapy (ovarian suppression or oophorectomy plus Letrozole) Adding Zoladex monthly to her treatment plan ------------------------------------------------------------------ Treatment plan: Continue maintenance Kadcyla for 1 year (started 09/07/2020). Antiestrogen therapy with Zoladex with Letrozole Echo on 6/10: EF 61%  Kadcyla toxicities: Elevated liver enzymes: resolved since dose reduction Fatigue: improved since dose reduction Abnormal heart strain: repeat echo  And f/u with Dr. Aundra Dubin  Return to clinic every 3 weeks for Kadcyla and every 6 weeks to follow-up with me.

## 2021-05-04 NOTE — Addendum Note (Signed)
Addended by: Nicholas Lose on: 05/04/2021 02:30 PM   Modules accepted: Level of Service

## 2021-05-05 ENCOUNTER — Other Ambulatory Visit: Payer: Self-pay

## 2021-05-05 ENCOUNTER — Inpatient Hospital Stay: Payer: BC Managed Care – PPO

## 2021-05-05 VITALS — BP 112/59 | HR 50 | Temp 98.1°F | Resp 18 | Wt 160.8 lb

## 2021-05-05 DIAGNOSIS — Z95828 Presence of other vascular implants and grafts: Secondary | ICD-10-CM

## 2021-05-05 DIAGNOSIS — Z170 Estrogen receptor positive status [ER+]: Secondary | ICD-10-CM

## 2021-05-05 DIAGNOSIS — C50412 Malignant neoplasm of upper-outer quadrant of left female breast: Secondary | ICD-10-CM

## 2021-05-05 LAB — CMP (CANCER CENTER ONLY)
ALT: 33 U/L (ref 0–44)
AST: 40 U/L (ref 15–41)
Albumin: 4.1 g/dL (ref 3.5–5.0)
Alkaline Phosphatase: 89 U/L (ref 38–126)
Anion gap: 8 (ref 5–15)
BUN: 9 mg/dL (ref 6–20)
CO2: 27 mmol/L (ref 22–32)
Calcium: 9.4 mg/dL (ref 8.9–10.3)
Chloride: 107 mmol/L (ref 98–111)
Creatinine: 0.73 mg/dL (ref 0.44–1.00)
GFR, Estimated: >60 mL/min (ref >60)
Glucose, Bld: 95 mg/dL (ref 70–99)
Potassium: 3.7 mmol/L (ref 3.5–5.1)
Sodium: 142 mmol/L (ref 135–145)
Total Bilirubin: 0.2 mg/dL — ABNORMAL LOW (ref 0.3–1.2)
Total Protein: 7.3 g/dL (ref 6.5–8.1)

## 2021-05-05 LAB — CBC WITH DIFFERENTIAL (CANCER CENTER ONLY)
Abs Immature Granulocytes: 0.01 10*3/uL (ref 0.00–0.07)
Basophils Absolute: 0.1 10*3/uL (ref 0.0–0.1)
Basophils Relative: 1 %
Eosinophils Absolute: 0.1 10*3/uL (ref 0.0–0.5)
Eosinophils Relative: 1 %
HCT: 33.3 % — ABNORMAL LOW (ref 36.0–46.0)
Hemoglobin: 10.8 g/dL — ABNORMAL LOW (ref 12.0–15.0)
Immature Granulocytes: 0 %
Lymphocytes Relative: 41 %
Lymphs Abs: 2.4 10*3/uL (ref 0.7–4.0)
MCH: 28.7 pg (ref 26.0–34.0)
MCHC: 32.4 g/dL (ref 30.0–36.0)
MCV: 88.6 fL (ref 80.0–100.0)
Monocytes Absolute: 0.4 10*3/uL (ref 0.1–1.0)
Monocytes Relative: 8 %
Neutro Abs: 2.8 10*3/uL (ref 1.7–7.7)
Neutrophils Relative %: 49 %
Platelet Count: 169 10*3/uL (ref 150–400)
RBC: 3.76 MIL/uL — ABNORMAL LOW (ref 3.87–5.11)
RDW: 12.4 % (ref 11.5–15.5)
WBC Count: 5.8 10*3/uL (ref 4.0–10.5)
nRBC: 0 % (ref 0.0–0.2)

## 2021-05-05 MED ORDER — HEPARIN SOD (PORK) LOCK FLUSH 100 UNIT/ML IV SOLN
500.0000 [IU] | Freq: Once | INTRAVENOUS | Status: AC | PRN
  Administered 2021-05-05: 500 [IU]

## 2021-05-05 MED ORDER — SODIUM CHLORIDE 0.9 % IV SOLN
Freq: Once | INTRAVENOUS | Status: AC
Start: 2021-05-05 — End: 2021-05-05

## 2021-05-05 MED ORDER — FAMOTIDINE 20 MG IN NS 100 ML IVPB
20.0000 mg | Freq: Once | INTRAVENOUS | Status: AC
  Administered 2021-05-05: 20 mg via INTRAVENOUS
  Filled 2021-05-05: qty 100

## 2021-05-05 MED ORDER — ACETAMINOPHEN 325 MG PO TABS
650.0000 mg | ORAL_TABLET | Freq: Once | ORAL | Status: AC
  Administered 2021-05-05: 650 mg via ORAL
  Filled 2021-05-05: qty 2

## 2021-05-05 MED ORDER — ONDANSETRON HCL 4 MG/2ML IJ SOLN
8.0000 mg | Freq: Once | INTRAMUSCULAR | Status: AC
  Administered 2021-05-05: 8 mg via INTRAVENOUS
  Filled 2021-05-05: qty 4

## 2021-05-05 MED ORDER — SODIUM CHLORIDE 0.9 % IV SOLN
2.4000 mg/kg | Freq: Once | INTRAVENOUS | Status: AC
  Administered 2021-05-05: 180 mg via INTRAVENOUS
  Filled 2021-05-05: qty 9

## 2021-05-05 MED ORDER — SODIUM CHLORIDE 0.9% FLUSH
10.0000 mL | INTRAVENOUS | Status: DC | PRN
  Administered 2021-05-05: 10 mL via INTRAVENOUS

## 2021-05-05 MED ORDER — SODIUM CHLORIDE 0.9% FLUSH
10.0000 mL | INTRAVENOUS | Status: DC | PRN
  Administered 2021-05-05: 10 mL

## 2021-05-18 ENCOUNTER — Encounter: Payer: Self-pay | Admitting: Hematology and Oncology

## 2021-05-18 ENCOUNTER — Encounter (HOSPITAL_COMMUNITY): Payer: Self-pay | Admitting: Cardiology

## 2021-05-22 ENCOUNTER — Encounter: Payer: Self-pay | Admitting: Hematology and Oncology

## 2021-05-24 ENCOUNTER — Encounter (HOSPITAL_BASED_OUTPATIENT_CLINIC_OR_DEPARTMENT_OTHER): Payer: Self-pay | Admitting: Obstetrics and Gynecology

## 2021-05-24 ENCOUNTER — Other Ambulatory Visit: Payer: Self-pay

## 2021-05-24 NOTE — Progress Notes (Addendum)
Spoke w/ via phone for pre-op interview---pt  Lab needs dos---- urine pregnancy POCT, type and screen           Lab results------05/05/21 CBC & CMP in Epic, 03/03/21 EKG in chart & Epic, 04/14/21 Echo in [place] test -----patient states asymptomatic no test needed Arrive at -------0530 on 05/29/21 NPO after MN NO Solid Food.  Clear liquids from MN until---0430 Med rec completed Medications to take morning of surgery -----Ativan Diabetic medication -----n/a Patient instructed no nail polish to be worn day of surgery Patient instructed to bring photo id and insurance card day of surgery Patient aware to have Driver (ride ) / caregiver    for 24 hours after surgery - Gustavo Lah, mom Patient Special Instructions -----none Pre-Op special Istructions -----none Patient verbalized understanding of instructions that were given at this phone interview. Patient denies shortness of breath, chest pain, fever, cough at this phone interview.   Patient has a hx of left sided breast cancer in 2016, and a relapse of left breast cancer in 07/2020. She is s/p chemotherapy and mastectomy. Last dose of Kadcyla was 05/05/21. Oncologist: Dr. Nicholas Lose, [place] 05/04/21  Patient sees cardiologist due to reduced EF related to chemotherapy. 04/14/21 echo showed EF of 60 - 65% in Epic. Cardiologist: Dr. Loralie Champagne, [place] 04/14/21

## 2021-05-24 NOTE — H&P (Signed)
Natasha Torres is an 40 y.o. female. Presenting for scheduled surgery. She has a history of the ATM gene mutation with breast cancer. She desires risk reducing oophorectomy. She has a history of a prior RSO for a dermoid.  Pertinent Gynecological History: Menses:  none on menstrual suppression OB History: G1, P1 - cesarean section  Menstrual History: Patient's last menstrual period was 09/19/2020 (approximate).    Past Medical History:  Diagnosis Date   Adverse effect of chemotherapy    reduced EF   Anemia    takes iron supplement   Anxiety    Breast cancer 08/2014   Left   Cardiomyopathy secondary to chemotherapy ([facility]) 2016   mild, suspected Herceptin related, 01/2015 EF 45%   Family history of adverse reaction to anesthesia    pt's mother has hx. of post-op N/V   History of chemotherapy 2016   for left breast cancer - carboplatin, taxotere, perjeta, herceptin   History of chemotherapy 2022   Began Kadcyla every 21 days x 4 cycles, 09/07/20 - 11/10/20. After surgery on 11/29/20 pt began maintenance Kadcyla for 1 year.   Recurrent breast cancer, left ([place]) dx'd 07/2020    Past Surgical History:  Procedure Laterality Date   BREAST BIOPSY  08/12/2020   BREAST LUMPECTOMY Left 08/17/2014   BREAST RECONSTRUCTION WITH PLACEMENT OF TISSUE EXPANDER AND FLEX HD (ACELLULAR HYDRATED DERMIS) Bilateral 02/10/2015   Procedure: BREAST RECONSTRUCTION WITH PLACEMENT OF TISSUE EXPANDER AND FLEX HD (ACELLULAR HYDRATED DERMIS);  Surgeon: Irene Limbo, MD;  Location: [facility];  Service: Plastics;  Laterality: Bilateral;   INCISION AND DRAINAGE OF WOUND Left 03/02/2015   Procedure: IRRIGATION BREAST POCKET AND EXCHANGE OF LEFT BREAST TISSUE EXPANDER;  Surgeon: Irene Limbo, MD;  Location: [facility];  Service: Plastics;  Laterality: Left;   LIPOSUCTION WITH LIPOFILLING Bilateral 06/07/2015   Procedure: LIPOSUCTION WITH LIPOFILLING TO BILATERAL CHEST;  Surgeon: Irene Limbo, MD;  Location: [facility];  Service: Plastics;  Laterality: Bilateral;   LIPOSUCTION WITH LIPOFILLING Bilateral 09/23/2015   Procedure: LIPOFILLING FROM BILATERAL THIGHS TO BILATERAL CHEST ;  Surgeon: Irene Limbo, MD;  Location: [facility];  Service: Plastics;  Laterality: Bilateral;   LIPOSUCTION WITH LIPOFILLING Bilateral 06/15/2016   Procedure: LIPOFILLING FROM BILATERAL THIGH TO BILATERAL CHEST;  Surgeon: Irene Limbo, MD;  Location: [facility];  Service: Plastics;  Laterality: Bilateral;  LIPOFILLING FROM BILATERAL THIGH TO BILATERAL CHEST   MASTECTOMY Bilateral 02/10/2015   NIPPLE SPARING MASTECTOMY/SENTINAL LYMPH NODE BIOPSY/RECONSTRUCTION/PLACEMENT OF TISSUE EXPANDER Bilateral 02/10/2015   Procedure: BILATERAL  NIPPLE SPARING MASTECTOMY WITH LEFT  SENTINAL LYMPH NODE BIOPSY(RIGHT BREAST PROPHYLACTIC);  Surgeon: Rolm Bookbinder, MD;  Location: [facility];  Service: General;  Laterality: Bilateral;   ORIF FINGER / THUMB FRACTURE Right 06/18/1992   thumb   PORT-A-CATH REMOVAL  01/07/2015   removed and replaced   PORT-A-CATH REMOVAL Right 09/23/2015   Procedure: REMOVAL PORT-A-CATH;  Surgeon: Irene Limbo, MD;  Location: [facility];  Service: Plastics;  Laterality: Right;   PORTACATH PLACEMENT  08/2014; 01/07/2015   PORTACATH PLACEMENT Right 09/06/2020   Procedure: INSERTION PORT-A-CATH;  Surgeon: Rolm Bookbinder, MD;  Location: [facility];  Service: General;  Laterality: Right;   RADIOACTIVE SEED GUIDED EXCISIONAL BREAST BIOPSY Left 11/29/2020   Procedure: RADIOACTIVE SEED GUIDED EXCISION OF LEFT BREAST CANCER x2;  Surgeon: Rolm Bookbinder, MD;  Location: [facility];  Service: General;  Laterality: Left;   REMOVAL OF BILATERAL TISSUE EXPANDERS WITH PLACEMENT  OF BILATERAL BREAST IMPLANTS Bilateral 06/07/2015   Procedure: REMOVAL OF BILATERAL TISSUE EXPANDERS WITH PLACEMENT OF BILATERAL BREAST  SILICONE IMPLANTS;  Surgeon: Irene Limbo, MD;  Location: [facility];  Service: Plastics;  Laterality: Bilateral;   REMOVAL OF TISSUE EXPANDER AND PLACEMENT OF IMPLANT Right 02/27/2015   Procedure: REMOVAL OF TISSUE EXPANDER AND PLACEMENT OF NEW TISSUE EXPANDER;  Surgeon: Irene Limbo, MD;  Location: [facility];  Service: Plastics;  Laterality: Right;   SALPINGOOPHORECTOMY Right 11/22/2010   due to dermoid cyst   TISSUE EXPANDER PLACEMENT Left 03/02/2015   Procedure: TISSUE EXPANDER;  Surgeon: Irene Limbo, MD;  Location: [facility];  Service: Plastics;  Laterality: Left;   WISDOM TOOTH EXTRACTION  06/19/1999    Family History  Problem Relation Age of Onset   Hypertension Mother    Anesthesia problems Mother        post-op N/V   Prostate cancer Father 74   Depression Maternal Aunt    Dementia Maternal Grandmother    Cancer Maternal Grandfather    Heart attack Maternal Grandfather    Dementia Paternal Grandmother    Kidney cancer Paternal Grandmother        slow growing, no treatment   Cancer Paternal Grandfather    Prostate cancer Paternal Grandfather 71   Bone cancer Paternal Grandfather 6   Breast cancer Paternal Grandfather 73   Lung cancer Paternal Grandfather        dx late 18s; smoker.  thought to be a 4th primary cancer    Social History:  reports that she has never smoked. She has never used smokeless tobacco. She reports that she does not currently use alcohol. She reports current drug use. Drug: Marijuana.  Allergies:  Allergies  Allergen Reactions   Tramadol Nausea Only   Buprenorphine Hcl Itching   Morphine And Related Itching   Other Rash    DERMABOND   Penicillins Itching   Tegaderm Ag Mesh [Silver] Rash    No medications prior to admission.    Review of Systems  Height _0  (1.803 m), weight 70.3 kg, last menstrual period 09/19/2020. Physical Exam Gen: well appearing, NAD CV: Reg rate Pulm: NWOB Abd: soft, nondistended, nontender, no masses GYN: uterus 6 week  size, no adnexa ttp/CMT Ext: No edema b/l   No results found for this or any previous visit (from the past 24 hour(s)).  No results found.  Assessment/Plan:  40 yo presenting for risk-reducing surgery. After careful discussing, weighing the risks and benefits, she elected to have a laparoscopic salpingo-oopherectomy of the left side with pelvic washings. Will also remove any remnant of the right side if present.  Risks were reviewed including infection, bleeding, damage to surrounding structures, need for additional procedures, conversion to an open procedure, pain s/s scarring, the possibility of cancer/unexpected abnormality on final pathology, the possibility of not fixing this issue, and thromboembolic disease.  We discussed my plan for surgery which includes [place] LSO with pelvic washings and any additional procedures including removal of an remnant of right sided adnexal structures or possible open procedure. Consent signed.   Tyson Dense 05/24/2021, 1:38 PM

## 2021-05-25 ENCOUNTER — Ambulatory Visit: Payer: BC Managed Care – PPO

## 2021-05-25 ENCOUNTER — Other Ambulatory Visit: Payer: BC Managed Care – PPO

## 2021-05-25 ENCOUNTER — Ambulatory Visit: Payer: BC Managed Care – PPO | Admitting: Adult Health

## 2021-05-25 ENCOUNTER — Encounter: Payer: Self-pay | Admitting: *Deleted

## 2021-05-26 ENCOUNTER — Encounter: Payer: Self-pay | Admitting: *Deleted

## 2021-05-26 ENCOUNTER — Ambulatory Visit: Payer: BC Managed Care – PPO

## 2021-05-28 ENCOUNTER — Other Ambulatory Visit (HOSPITAL_COMMUNITY): Payer: Self-pay | Admitting: Cardiology

## 2021-05-28 NOTE — Anesthesia Preprocedure Evaluation (Addendum)
Anesthesia Evaluation  Patient identified by MRN, date of birth, ID band Patient awake    Reviewed: Allergy & Precautions, NPO status   Airway Mallampati: II  TM Distance: >3 FB     Dental   Pulmonary    breath sounds clear to auscultation       Cardiovascular negative cardio ROS   Rhythm:Regular Rate:Normal  History noted Dr. Nyoka Cowden. Followed by cardiologist. No SOB or CP.    Neuro/Psych PSYCHIATRIC DISORDERS    GI/Hepatic negative GI ROS, Neg liver ROS,   Endo/Other  negative endocrine ROS  Renal/GU negative Renal ROS     Musculoskeletal   Abdominal   Peds  Hematology   Anesthesia Other Findings   Reproductive/Obstetrics                            Anesthesia Physical Anesthesia Plan  ASA: 2  Anesthesia Plan: General   Post-op Pain Management:    Induction: Intravenous  PONV Risk Score and Plan: 3 and Ondansetron, Dexamethasone and Midazolam  Airway Management Planned: Oral ETT  Additional Equipment:   Intra-op Plan:   Post-operative Plan: Extubation in OR  Informed Consent: I have reviewed the patients History and Physical, chart, labs and discussed the procedure including the risks, benefits and alternatives for the proposed anesthesia with the patient or authorized representative who has indicated his/her understanding and acceptance.     Dental advisory given  Plan Discussed with: CRNA and Anesthesiologist  Anesthesia Plan Comments:        Anesthesia Quick Evaluation

## 2021-05-29 ENCOUNTER — Ambulatory Visit (HOSPITAL_BASED_OUTPATIENT_CLINIC_OR_DEPARTMENT_OTHER): Payer: BC Managed Care – PPO | Admitting: Anesthesiology

## 2021-05-29 ENCOUNTER — Encounter (HOSPITAL_BASED_OUTPATIENT_CLINIC_OR_DEPARTMENT_OTHER)
Admission: RE | Disposition: A | Discharge status: Home / Self Care | Payer: Self-pay | Source: Home / Self Care | Attending: Obstetrics and Gynecology

## 2021-05-29 ENCOUNTER — Encounter (HOSPITAL_BASED_OUTPATIENT_CLINIC_OR_DEPARTMENT_OTHER): Payer: Self-pay | Admitting: Obstetrics and Gynecology

## 2021-05-29 ENCOUNTER — Ambulatory Visit (HOSPITAL_BASED_OUTPATIENT_CLINIC_OR_DEPARTMENT_OTHER)
Admission: RE | Admit: 2021-05-29 | Discharge: 2021-05-29 | Disposition: A | Discharge status: Home / Self Care | Payer: BC Managed Care – PPO | Attending: Obstetrics and Gynecology | Admitting: Obstetrics and Gynecology

## 2021-05-29 DIAGNOSIS — C50912 Malignant neoplasm of unspecified site of left female breast: Secondary | ICD-10-CM | POA: Diagnosis not present

## 2021-05-29 DIAGNOSIS — Z1501 Genetic susceptibility to malignant neoplasm of breast: Secondary | ICD-10-CM | POA: Insufficient documentation

## 2021-05-29 DIAGNOSIS — C50919 Malignant neoplasm of unspecified site of unspecified female breast: Secondary | ICD-10-CM

## 2021-05-29 DIAGNOSIS — Z452 Encounter for adjustment and management of vascular access device: Secondary | ICD-10-CM | POA: Insufficient documentation

## 2021-05-29 HISTORY — PX: LAPAROSCOPIC SALPINGO OOPHERECTOMY: SHX5927

## 2021-05-29 HISTORY — PX: PORT-A-CATH REMOVAL: SHX5289

## 2021-05-29 HISTORY — DX: Adverse effect of antineoplastic and immunosuppressive drugs, initial encounter: T45.1X5A

## 2021-05-29 LAB — TYPE AND SCREEN
ABO/RH(D): A POS
Antibody Screen: NEGATIVE

## 2021-05-29 LAB — POCT PREGNANCY, URINE: Preg Test, Ur: NEGATIVE

## 2021-05-29 LAB — ABO/RH: ABO/RH(D): A POS

## 2021-05-29 SURGERY — SALPINGO-OOPHORECTOMY, LAPAROSCOPIC
Anesthesia: General | Site: Chest | Laterality: Right

## 2021-05-29 MED ORDER — EPHEDRINE 5 MG/ML INJ
INTRAVENOUS | Status: AC
  Filled 2021-05-29: qty 5

## 2021-05-29 MED ORDER — PROPOFOL 10 MG/ML IV BOLUS
INTRAVENOUS | Status: AC
  Filled 2021-05-29: qty 20

## 2021-05-29 MED ORDER — ROCURONIUM BROMIDE 100 MG/10ML IV SOLN
INTRAVENOUS | Status: DC | PRN
  Administered 2021-05-29: 10 mg via INTRAVENOUS
  Administered 2021-05-29: 60 mg via INTRAVENOUS

## 2021-05-29 MED ORDER — ONDANSETRON HCL 4 MG/2ML IJ SOLN
INTRAMUSCULAR | Status: AC
  Filled 2021-05-29: qty 2

## 2021-05-29 MED ORDER — KETOROLAC TROMETHAMINE 30 MG/ML IJ SOLN
INTRAMUSCULAR | Status: DC | PRN
  Administered 2021-05-29: 30 mg via INTRAVENOUS

## 2021-05-29 MED ORDER — FENTANYL CITRATE (PF) 100 MCG/2ML IJ SOLN
INTRAMUSCULAR | Status: AC
  Filled 2021-05-29: qty 2

## 2021-05-29 MED ORDER — ROCURONIUM BROMIDE 10 MG/ML (PF) SYRINGE
PREFILLED_SYRINGE | INTRAVENOUS | Status: AC
  Filled 2021-05-29: qty 10

## 2021-05-29 MED ORDER — MIDAZOLAM HCL 5 MG/5ML IJ SOLN
INTRAMUSCULAR | Status: DC | PRN
  Administered 2021-05-29: 2 mg via INTRAVENOUS

## 2021-05-29 MED ORDER — OXYCODONE HCL 5 MG PO TABS
ORAL_TABLET | ORAL | Status: AC
  Filled 2021-05-29: qty 1

## 2021-05-29 MED ORDER — LACTATED RINGERS IV SOLN: INTRAVENOUS | Status: DC

## 2021-05-29 MED ORDER — DEXAMETHASONE SODIUM PHOSPHATE 10 MG/ML IJ SOLN
INTRAMUSCULAR | Status: AC
  Filled 2021-05-29: qty 1

## 2021-05-29 MED ORDER — POVIDONE-IODINE 10 % EX SWAB: 2.0000 "application " | Freq: Once | CUTANEOUS | Status: DC

## 2021-05-29 MED ORDER — MIDAZOLAM HCL 2 MG/2ML IJ SOLN
INTRAMUSCULAR | Status: AC
  Filled 2021-05-29: qty 2

## 2021-05-29 MED ORDER — OXYCODONE HCL 5 MG PO TABS
5.0000 mg | ORAL_TABLET | Freq: Once | ORAL | Status: AC | PRN
  Administered 2021-05-29: 5 mg via ORAL

## 2021-05-29 MED ORDER — EPHEDRINE SULFATE 50 MG/ML IJ SOLN
INTRAMUSCULAR | Status: DC | PRN
  Administered 2021-05-29: 20 mg via INTRAVENOUS

## 2021-05-29 MED ORDER — FENTANYL CITRATE (PF) 100 MCG/2ML IJ SOLN
25.0000 ug | INTRAMUSCULAR | Status: DC | PRN
  Administered 2021-05-29 (×2): 25 ug via INTRAVENOUS
  Administered 2021-05-29: 50 ug via INTRAVENOUS

## 2021-05-29 MED ORDER — PROPOFOL 10 MG/ML IV BOLUS
INTRAVENOUS | Status: DC | PRN
  Administered 2021-05-29: 150 mg via INTRAVENOUS

## 2021-05-29 MED ORDER — DEXAMETHASONE SODIUM PHOSPHATE 4 MG/ML IJ SOLN
INTRAMUSCULAR | Status: DC | PRN
  Administered 2021-05-29: 10 mg via INTRAVENOUS

## 2021-05-29 MED ORDER — LIDOCAINE HCL (CARDIAC) PF 100 MG/5ML IV SOSY
PREFILLED_SYRINGE | INTRAVENOUS | Status: DC | PRN
  Administered 2021-05-29: 100 mg via INTRAVENOUS

## 2021-05-29 MED ORDER — SUGAMMADEX SODIUM 200 MG/2ML IV SOLN
INTRAVENOUS | Status: DC | PRN
  Administered 2021-05-29: 200 mg via INTRAVENOUS

## 2021-05-29 MED ORDER — HYDROMORPHONE HCL 1 MG/ML IJ SOLN: 0.2500 mg | INTRAMUSCULAR | Status: DC | PRN

## 2021-05-29 MED ORDER — ONDANSETRON HCL 4 MG/2ML IJ SOLN
INTRAMUSCULAR | Status: DC | PRN
  Administered 2021-05-29: 4 mg via INTRAVENOUS

## 2021-05-29 MED ORDER — ACETAMINOPHEN 500 MG PO TABS
ORAL_TABLET | ORAL | Status: AC
  Filled 2021-05-29: qty 2

## 2021-05-29 MED ORDER — OXYCODONE HCL 5 MG/5ML PO SOLN: 5.0000 mg | Freq: Once | ORAL | Status: AC | PRN

## 2021-05-29 MED ORDER — FENTANYL CITRATE (PF) 100 MCG/2ML IJ SOLN
INTRAMUSCULAR | Status: DC | PRN
  Administered 2021-05-29: 100 ug via INTRAVENOUS
  Administered 2021-05-29 (×2): 50 ug via INTRAVENOUS

## 2021-05-29 MED ORDER — ACETAMINOPHEN 500 MG PO TABS
1000.0000 mg | ORAL_TABLET | ORAL | Status: AC
  Administered 2021-05-29: 1000 mg via ORAL

## 2021-05-29 MED ORDER — LIDOCAINE 2% (20 MG/ML) 5 ML SYRINGE
INTRAMUSCULAR | Status: AC
  Filled 2021-05-29: qty 5

## 2021-05-29 MED ORDER — BUPIVACAINE HCL (PF) 0.25 % IJ SOLN
INTRAMUSCULAR | Status: DC | PRN
  Administered 2021-05-29: 2 mL

## 2021-05-29 SURGICAL SUPPLY — 67 items
ADH SKN CLS APL DERMABOND .7 (GAUZE/BANDAGES/DRESSINGS)
APL SKNCLS STERI-STRIP NONHPOA (GAUZE/BANDAGES/DRESSINGS) ×4
BAG RETRIEVAL 10 (BASKET) ×1
BENZOIN TINCTURE PRP APPL 2/3 (GAUZE/BANDAGES/DRESSINGS) ×4 IMPLANT
BLADE CLIPPER SENSICLIP SURGIC (BLADE) IMPLANT
BLADE SURG 15 STRL LF DISP TIS (BLADE) ×2 IMPLANT
BLADE SURG 15 STRL SS (BLADE) ×3
BNDG ADH 1X3 SHEER STRL LF (GAUZE/BANDAGES/DRESSINGS) ×3 IMPLANT
BNDG ADH THN 3X1 STRL LF (GAUZE/BANDAGES/DRESSINGS) ×2
CABLE HIGH FREQUENCY MONO STRZ (ELECTRODE) IMPLANT
CANISTER SUCT 1200ML W/VALVE (MISCELLANEOUS) IMPLANT
CATH ROBINSON RED A/P 16FR (CATHETERS) ×3 IMPLANT
CLOTH BEACON ORANGE TIMEOUT ST (SAFETY) ×3 IMPLANT
COVER BACK TABLE 60X90IN (DRAPES) ×4 IMPLANT
COVER MAYO STAND STRL (DRAPES) ×6 IMPLANT
DERMABOND ADVANCED (GAUZE/BANDAGES/DRESSINGS)
DERMABOND ADVANCED .7 DNX12 (GAUZE/BANDAGES/DRESSINGS) ×2 IMPLANT
DISSECTOR ROUND CHERRY 3/8 STR (MISCELLANEOUS) IMPLANT
DRAPE 3/4 80X56 (DRAPES) ×1 IMPLANT
DRAPE LAPAROTOMY 100X72 PEDS (DRAPES) ×3 IMPLANT
DRSG OPSITE POSTOP 3X4 (GAUZE/BANDAGES/DRESSINGS) IMPLANT
DRSG TEGADERM 4X4.75 (GAUZE/BANDAGES/DRESSINGS) ×2 IMPLANT
DURAPREP 26ML APPLICATOR (WOUND CARE) ×4 IMPLANT
ELECT REM PT RETURN 9FT ADLT (ELECTROSURGICAL) ×3
ELECTRODE REM PT RTRN 9FT ADLT (ELECTROSURGICAL) ×2 IMPLANT
GAUZE 4X4 16PLY ~~LOC~~+RFID DBL (SPONGE) ×6 IMPLANT
GAUZE SPONGE 4X4 12PLY STRL (GAUZE/BANDAGES/DRESSINGS) IMPLANT
GLOVE SURG ENC MOIS LTX SZ6.5 (GLOVE) ×3 IMPLANT
GLOVE SURG ENC MOIS LTX SZ7.5 (GLOVE) ×3 IMPLANT
GLOVE SURG UNDER POLY LF SZ6.5 (GLOVE) ×3 IMPLANT
GOWN STRL REUS W/TWL LRG LVL3 (GOWN DISPOSABLE) ×3 IMPLANT
IV NS 1000ML (IV SOLUTION) ×3
IV NS 1000ML BAXH (IV SOLUTION) IMPLANT
KIT TURNOVER CYSTO (KITS) ×6 IMPLANT
LIGASURE LAP L-HOOKWIRE 5 44CM (INSTRUMENTS) ×1 IMPLANT
MANIFOLD NEPTUNE II (INSTRUMENTS) IMPLANT
NDL HYPO 25X1 1.5 SAFETY (NEEDLE) ×4 IMPLANT
NEEDLE HYPO 25X1 1.5 SAFETY (NEEDLE) ×6 IMPLANT
NEEDLE INSUFFLATION 120MM (ENDOMECHANICALS) ×3 IMPLANT
NS IRRIG 500ML POUR BTL (IV SOLUTION) ×6 IMPLANT
PACK BASIN DAY SURGERY FS (CUSTOM PROCEDURE TRAY) ×3 IMPLANT
PACK LAPAROSCOPY BASIN (CUSTOM PROCEDURE TRAY) ×3 IMPLANT
PAD PREP 24X48 CUFFED NSTRL (MISCELLANEOUS) ×1 IMPLANT
PENCIL SMOKE EVACUATOR (MISCELLANEOUS) ×3 IMPLANT
SCISSORS LAP 5X35 DISP (ENDOMECHANICALS) IMPLANT
SCISSORS LAP 5X45 EPIX DISP (ENDOMECHANICALS) IMPLANT
SET SUCTION IRRIG HYDROSURG (IRRIGATION / IRRIGATOR) ×1 IMPLANT
SET TUBE SMOKE EVAC HIGH FLOW (TUBING) ×3 IMPLANT
STRIP CLOSURE SKIN 1/2X4 (GAUZE/BANDAGES/DRESSINGS) ×4 IMPLANT
SUT MON AB 4-0 PC3 18 (SUTURE) ×3 IMPLANT
SUT VIC AB 3-0 SH 27 (SUTURE) ×3
SUT VIC AB 3-0 SH 27X BRD (SUTURE) ×2 IMPLANT
SUT VICRYL 0 UR6 27IN ABS (SUTURE) ×3 IMPLANT
SUT VICRYL RAPIDE 4/0 PS 2 (SUTURE) ×3 IMPLANT
SYR 50ML LL SCALE MARK (SYRINGE) IMPLANT
SYR BULB EAR ULCER 3OZ GRN STR (SYRINGE) IMPLANT
SYR CONTROL 10ML LL (SYRINGE) ×3 IMPLANT
SYS BAG RETRIEVAL 10MM (BASKET) ×2
SYSTEM BAG RETRIEVAL 10MM (BASKET) IMPLANT
TOWEL OR 17X26 10 PK STRL BLUE (TOWEL DISPOSABLE) ×3 IMPLANT
TRAY DSU PREP LF (CUSTOM PROCEDURE TRAY) ×3 IMPLANT
TROCAR BLADELESS OPT 5 100 (ENDOMECHANICALS) ×3 IMPLANT
TROCAR XCEL NON-BLD 11X100MML (ENDOMECHANICALS) ×1 IMPLANT
TUBE CONNECTING 12X1/4 (SUCTIONS) IMPLANT
WARMER LAPAROSCOPE (MISCELLANEOUS) ×3 IMPLANT
WATER STERILE IRR 500ML POUR (IV SOLUTION) ×4 IMPLANT
YANKAUER SUCT BULB TIP NO VENT (SUCTIONS) IMPLANT

## 2021-05-29 NOTE — Interval H&P Note (Signed)
History and Physical Interval Note:  05/29/2021 7:11 AM  Natasha Torres  has presented today for surgery, with the diagnosis of ATM GENE MUTATION.  The various methods of treatment have been discussed with the patient and family. After consideration of risks, benefits and other options for treatment, the patient has consented to  Procedure(s): LAPAROSCOPIC LEFT SALPINGO OOPHORECTOMY; PELVIC WASHING (Left) REMOVAL PORT-A-CATH (N/A) as a surgical intervention.  The patient's history has been reviewed, patient examined, no change in status, stable for surgery.  I have reviewed the patient's chart and labs.  Questions were answered to the patient's satisfaction.     Rolm Bookbinder

## 2021-05-29 NOTE — Transfer of Care (Signed)
Immediate Anesthesia Transfer of Care Note  Patient: Natasha Torres  Procedure(s) Performed: Procedure(s) (LRB): LAPAROSCOPIC LEFT SALPINGO OOPHORECTOMY; PELVIC WASHING (Left) REMOVAL PORT-A-CATH (Right)  Patient Location: PACU  Anesthesia Type: General  Level of Consciousness: awake, sedated, patient cooperative and responds to stimulation  Airway & Oxygen Therapy: Patient Spontanous Breathing and Patient connected to [place] 02 and soft FM   Post-op Assessment: Report given to PACU RN, Post -op Vital signs reviewed and stable and Patient moving all extremities  Post vital signs: Reviewed and stable  Complications: No apparent anesthesia complications

## 2021-05-29 NOTE — Op Note (Signed)
  PREOPERATIVE DIAGNOSES: 1. Desires  risk reducing salpingo-oophorectomy  POSTOPERATIVE DIAGNOSES: 1. Same  PROCEDURE PERFORMED: Laparoscopic left sided salpingo-oophorectomy  SURGEON: Dr. Lucillie Garfinkel ASSISTANT: None  ANESTHESIA: General   ESTIMATED BLOOD LOSS: 50 cc.  URINE OUTPUT: 0 cc of clear urine - voided immediately before surgery  COMPLICATIONS: None  TUBES: None.  DRAINS: None  PATHOLOGY: Left tube and ovary  FINDINGS: On exam, under anesthesia, normal appearing vulva and vagina, a normal sized uterus.   Operative findings demonstrated a  normal uterus. Normal fallopian tubes and ovary on the left. Surgically absent on the right. The bowel and omentum were normal appearing.  Procedure: A general anesthesia was induced and the patient was placed in the dirsal lithotomy position. The abdomen, perineum, and vagina were prepped and draped in the usual fashion. The bladder was drained. After the initial preparation, the procedure commenced at the vagina. With a speculum in place to visualize the cervix, the cervix was grasped and a jacobs tenaculum was placed within the uterine cavity for manipulation purposes being careful not to puncture the uterus. Attention was then turned to the abdomen.    An infraumbilical incision was made and the Veress needle was gently advanced taking care to feel for the typical sensation of penetrating the peritoneum. With CO2 infiltration, an opening pressure of 2 mmHg was noted, and following this, a pneumoperitoneum of 15 mmHg was created. A 11 mm trocar was then passed through the same incision and the laparoscope was then inserted through the trocar sleeve.   Visualization of the peritoneal cavity was then obtained and a brief inspection did not reveal any signs of complications from entry. Under direct observation, 39mm suprapubic port  was placed taking care to respect anatomical landmarks and vessels. Once the placement of the port was  complete, the actual laparoscopic procedure began. Above operative findings noted.   Beginning on the left side, the Infundibular ligament was identified by lifting the tube towards the anterior wall of the abdomen. The ureter was confirmed along the pelvic side wall and peristalsis was noted. The ligasure device was then used to clamp and ligate the IP ligament in three sequential bites. The IP was then cut middistance, again being sure to be clear of the ureter. The fallopian tube and ovary were completed separate and ligasure marched to remove. An endocatch bag was then inserted and the specimens were carefully placed in it and removed.   All gas removed from abdomen and all instruments were removed. Fascia was closed with 0' vicryl and skin closed with 4'0 vicryl. The sponge and lap counts were correct times 2 at this time.  Jacobs tenaculum was removed.   The patient's procedure was terminated. We then awakened her. She was sent to the Recovery Room in good condition.

## 2021-05-29 NOTE — Discharge Instructions (Addendum)
  NO TYLENOL PRODUCTS UNTIL AFTER 12:15 PM TODAY.  NO IBUPROFEN PRODUCTS (IBUPROFEN, ADVIL, MOTRIN, ALEVE, NAPROXEN) UNTIL AFTER 3:00 PM TODAY.    Post Anesthesia Home Care Instructions  Activity: Get plenty of rest for the remainder of the day. A responsible individual must stay with you for 24 hours following the procedure.  For the next 24 hours, DO NOT: -Drive a car -Paediatric nurse -Drink alcoholic beverages -Take any medication unless instructed by your physician -Make any legal decisions or sign important papers.  Meals: Start with liquid foods such as gelatin or soup. Progress to regular foods as tolerated. Avoid greasy, spicy, heavy foods. If nausea and/or vomiting occur, drink only clear liquids until the nausea and/or vomiting subsides. Call your physician if vomiting continues.  Special Instructions/Symptoms: Your throat may feel dry or sore from the anesthesia or the breathing tube placed in your throat during surgery. If this causes discomfort, gargle with warm salt water. The discomfort should disappear within 24 hours.

## 2021-05-29 NOTE — Anesthesia Postprocedure Evaluation (Signed)
Anesthesia Post Note  Patient: Natasha Torres  Procedure(s) Performed: LAPAROSCOPIC LEFT SALPINGO OOPHORECTOMY; PELVIC WASHING (Left: Abdomen) REMOVAL PORT-A-CATH (Right: Chest)     Patient location during evaluation: PACU Anesthesia Type: General Level of consciousness: awake Pain management: pain level controlled Vital Signs Assessment: post-procedure vital signs reviewed and stable Respiratory status: spontaneous breathing Postop Assessment: no apparent nausea or vomiting Anesthetic complications: no   No notable events documented.  Last Vitals:  Vitals:   05/29/21 0930 05/29/21 0945  BP: 124/64 115/64  Pulse: 66 62  Resp: 13 (!) 30  Temp:    SpO2: 100% 100%    Last Pain:  Vitals:   05/29/21 1026  TempSrc:   PainSc: 4                  Laurieann Friddle

## 2021-05-29 NOTE — H&P (Signed)
Natasha Torres is an 40 y.o. female.   Chief Complaint: breast cancer, no longer needs venous access HPI: 34 yof s/p wle of recurrent left breast cancer. Had prior nsm and this was after chemotherapy. No residual disease. Doing well, has completed therapy and is due to bilateral SO today. 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 of post-op N/V   History of chemotherapy 2016   for left breast cancer - carboplatin, taxotere, perjeta, herceptin   History of chemotherapy 2022   Began Kadcyla every 21 days x 4 cycles, 09/07/20 - 11/10/20. After surgery on 11/29/20 pt began maintenance Kadcyla for 1 year.   Recurrent breast cancer, left ([place]) dx'd 07/2020    Past Surgical History:  Procedure Laterality Date   BREAST BIOPSY  08/12/2020   BREAST LUMPECTOMY Left 08/17/2014   BREAST RECONSTRUCTION WITH PLACEMENT OF TISSUE EXPANDER AND FLEX HD (ACELLULAR HYDRATED DERMIS) Bilateral 02/10/2015   Procedure: BREAST RECONSTRUCTION WITH PLACEMENT OF TISSUE EXPANDER AND FLEX HD (ACELLULAR HYDRATED DERMIS);  Surgeon: Irene Limbo, MD;  Location: [facility];  Service: Plastics;  Laterality: Bilateral;   INCISION AND DRAINAGE OF WOUND Left 03/02/2015   Procedure: IRRIGATION BREAST POCKET AND EXCHANGE OF LEFT BREAST TISSUE EXPANDER;  Surgeon: Irene Limbo, MD;  Location: [facility];  Service: Plastics;  Laterality: Left;   LIPOSUCTION WITH LIPOFILLING Bilateral 06/07/2015   Procedure: LIPOSUCTION WITH LIPOFILLING TO BILATERAL CHEST;  Surgeon: Irene Limbo, MD;  Location: [facility];  Service: Plastics;  Laterality: Bilateral;   LIPOSUCTION WITH LIPOFILLING Bilateral  09/23/2015   Procedure: LIPOFILLING FROM BILATERAL THIGHS TO BILATERAL CHEST ;  Surgeon: Irene Limbo, MD;  Location: [facility];  Service: Plastics;  Laterality: Bilateral;   LIPOSUCTION WITH LIPOFILLING Bilateral 06/15/2016   Procedure: LIPOFILLING FROM BILATERAL THIGH TO BILATERAL CHEST;  Surgeon: Irene Limbo, MD;  Location: [facility];  Service: Plastics;  Laterality: Bilateral;  LIPOFILLING FROM BILATERAL THIGH TO BILATERAL CHEST   MASTECTOMY Bilateral 02/10/2015   NIPPLE SPARING MASTECTOMY/SENTINAL LYMPH NODE BIOPSY/RECONSTRUCTION/PLACEMENT OF TISSUE EXPANDER Bilateral 02/10/2015   Procedure: BILATERAL  NIPPLE SPARING MASTECTOMY WITH LEFT  SENTINAL LYMPH NODE BIOPSY(RIGHT BREAST PROPHYLACTIC);  Surgeon: Rolm Bookbinder, MD;  Location: [place] [facility];  Service: General;  Laterality: Bilateral;   ORIF FINGER / THUMB FRACTURE Right 06/18/1992   thumb   PORT-A-CATH REMOVAL  01/07/2015   removed and replaced   PORT-A-CATH REMOVAL Right 09/23/2015   Procedure: REMOVAL PORT-A-CATH;  Surgeon: Irene Limbo, MD;  Location: [facility];  Service: Plastics;  Laterality: Right;   PORTACATH PLACEMENT  08/2014; 01/07/2015   PORTACATH PLACEMENT Right 09/06/2020   Procedure: INSERTION PORT-A-CATH;  Surgeon: Rolm Bookbinder, MD;  Location: [facility];  Service: General;  Laterality: Right;   RADIOACTIVE SEED GUIDED EXCISIONAL BREAST BIOPSY Left 11/29/2020   Procedure: RADIOACTIVE SEED GUIDED EXCISION OF LEFT BREAST CANCER x2;  Surgeon: Rolm Bookbinder, MD;  Location: [facility];  Service: General;  Laterality: Left;   REMOVAL OF BILATERAL TISSUE EXPANDERS WITH PLACEMENT OF BILATERAL BREAST IMPLANTS Bilateral 06/07/2015   Procedure: REMOVAL OF BILATERAL TISSUE  EXPANDERS WITH PLACEMENT OF BILATERAL BREAST  SILICONE IMPLANTS;  Surgeon: Irene Limbo, MD;  Location: [facility];  Service: Plastics;  Laterality: Bilateral;   REMOVAL  OF TISSUE EXPANDER AND PLACEMENT OF IMPLANT Right 02/27/2015   Procedure: REMOVAL OF TISSUE EXPANDER AND PLACEMENT OF NEW TISSUE EXPANDER;  Surgeon: Irene Limbo, MD;  Location: [facility];  Service: Plastics;  Laterality: Right;   SALPINGOOPHORECTOMY Right 11/22/2010   due to dermoid cyst   TISSUE EXPANDER PLACEMENT Left 03/02/2015   Procedure: TISSUE EXPANDER;  Surgeon: Irene Limbo, MD;  Location: [facility];  Service: Plastics;  Laterality: Left;   WISDOM TOOTH EXTRACTION  06/19/1999    Family History  Problem Relation Age of Onset   Hypertension Mother    Anesthesia problems Mother        post-op N/V   Prostate cancer Father 58   Depression Maternal Aunt    Dementia Maternal Grandmother    Cancer Maternal Grandfather    Heart attack Maternal Grandfather    Dementia Paternal Grandmother    Kidney cancer Paternal Grandmother        slow growing, no treatment   Cancer Paternal Grandfather    Prostate cancer Paternal Grandfather 72   Bone cancer Paternal Grandfather 62   Breast cancer Paternal Grandfather 34   Lung cancer Paternal Grandfather        dx late 75s; smoker.  thought to be a 4th primary cancer   Social History:  reports that she has never smoked. She has never used smokeless tobacco. She reports that she does not currently use alcohol. She reports current drug use. Drug: Marijuana.  Allergies:  Allergies  Allergen Reactions   Tramadol Nausea Only   Buprenorphine Hcl Itching   Morphine And Related Itching   Other Rash    DERMABOND   Penicillins Itching   Tegaderm Ag Mesh [Silver] Rash    Medications Prior to Admission  Medication Sig Dispense Refill   Ashwagandha 500 MG CAPS Take by mouth. Patient takes at bedtime. Stopped taking on 05/22/21 due to upcoming surgery on 05/29/21.     bisoprolol (ZEBETA) 5 MG tablet Take 0.5 tablets (2.5 mg total) by mouth at bedtime. 15 tablet 3   Black Pepper-Turmeric 3-500 MG CAPS Take by mouth. Stopped  on 12/5 for 05/29/21  surgery.     Cholecalciferol (VITAMIN D3) 50 MCG (2000 UT) TABS Take 2,000 Units by mouth at bedtime.     Cyanocobalamin (B-12) 100 MCG TABS Take by mouth. Patient takes 1 tablet at lunch time.     folic acid (FOLVITE) 540 MCG tablet Take 400 mcg by mouth daily. Stopped on 12/5 until after 05/29/21 surgery.     letrozole (FEMARA) 2.5 MG tablet Take 1 tablet (2.5 mg total) by mouth daily. 90 tablet 3   LORazepam (ATIVAN) 0.5 MG tablet Take 1 tablet (0.5 mg total) by mouth every 6 (six) hours as needed for anxiety. (Patient taking differently: Take 0.5 mg by mouth. Patient takes 1 tablet once a day prn.) 30 tablet 3   Magnesium Hydroxide 400 MG CHEW Chew 400 mg by mouth at bedtime. Stopped on 12/5 until 05/29/21 surgery.     melatonin 5 MG TABS Take 5 mg by mouth at bedtime as needed. Pt stopped on 12/5 for 05/29/21 surgery.     Multiple Vitamins-Minerals (ADULT ONE DAILY GUMMIES PO) Take 2 tablets by mouth at bedtime. Stopped 12/5 until 05/29/21 surgery.     NON FORMULARY Echinacea Tincture PRN . Pt  only takes if she feels like she is coming down with something.     Turmeric 500 MG CAPS Take 500 mg by mouth at bedtime. (Patient taking differently: Take 500 mg by mouth daily. Stopped on 12/5 until 05/29/21.)     Ado-Trastuzumab Emtansine (KADCYLA IV) Kadcyla  q 3 wks (Patient not taking: Reported on 05/24/2021)     Ferrous Sulfate (IRON) 325 (65 Fe) MG TABS Take by mouth. Patient takes 1 tablet at lunch time.Stopped on 12/5 until 05/29/21 surgery.      Results for orders placed or performed during the hospital encounter of 05/29/21 (from the past 48 hour(s))  Pregnancy, urine POC     Status: None   Collection Time: 05/29/21  5:53 AM  Result Value Ref Range   Preg Test, Ur NEGATIVE NEGATIVE    Comment:        THE SENSITIVITY OF THIS METHODOLOGY IS >24 mIU/mL    No results found.  Review of Systems  All other systems reviewed and are negative.  Blood pressure 119/80, pulse 60, temperature  98.5 F (36.9 C), temperature source Oral, resp. rate 15, height 5\' 11"  (1.803 m), weight 72.9 kg, last menstrual period 09/19/2020, SpO2 98 %. Physical Exam  General nad Cv regular Pulm effort normal Port right chest   Assessment/Plan Breast cancer no longer needs venous access -port removal  Rolm Bookbinder, MD 05/29/2021, 6:52 AM

## 2021-05-29 NOTE — Op Note (Addendum)
Preoperative diagnosis: Breast cancer no longer needs venous access Postoperative diagnosis: Same as above Procedure: Port removal Surgeon: Dr. Serita Grammes Anesthesia: General Estimated blood loss: Minimal Complications: None Drains: None Specimens: None Sponge and count was correct completion Disposition recovery stable condition  Indications: This is a 40 year old female who is completed her therapy for breast cancer no longer needs venous access.  She desires port removal.  We are going to do this in conjunction with her gynecologic surgery.  Procedure: After informed consent was obtained the patient was taken to the operating room.  She had SCDs in place.  She was positioned for her gynecologic procedure and appropriately padded.  She had then had her right chest prepped and draped in the standard sterile surgical fashion.  A surgical timeout was then performed.  I infiltrated Marcaine at her old scar.  I reentered her old incision.  I removed the port, suture material, and the line in their entirety.  I obtained hemostasis.  I closed this with 3-0 Vicryl and 4-0 Monocryl.  Benzoin and Steri-Strips were applied.  She tolerated this well and the case was turned over to Dr. Royston Sinner.

## 2021-05-29 NOTE — Anesthesia Procedure Notes (Signed)
Procedure Name: Intubation Date/Time: 05/29/2021 7:40 AM Performed by: Justice Rocher, CRNA Pre-anesthesia Checklist: Patient identified, Emergency Drugs available, Suction available, Patient being monitored and Timeout performed Patient Re-evaluated:Patient Re-evaluated prior to induction Oxygen Delivery Method: Circle system utilized Preoxygenation: Pre-oxygenation with 100% oxygen Induction Type: IV induction Ventilation: Mask ventilation without difficulty Grade View: Grade I Tube type: Oral Tube size: 7.0 mm Number of attempts: 1 Airway Equipment and Method: Stylet and Oral airway Placement Confirmation: ETT inserted through vocal cords under direct vision, positive ETCO2 and breath sounds checked- equal and bilateral Secured at: 22 cm Tube secured with: Tape Dental Injury: Teeth and Oropharynx as per pre-operative assessment

## 2021-05-29 NOTE — Progress Notes (Signed)
No updates to above H&P. Patient arrived NPO and was consented in PACU. Risks again discussed, all questions answered, and consent signed. Proceed with above surgery.    Isai Gottlieb MD  

## 2021-05-30 ENCOUNTER — Encounter (HOSPITAL_BASED_OUTPATIENT_CLINIC_OR_DEPARTMENT_OTHER): Payer: Self-pay | Admitting: Obstetrics and Gynecology

## 2021-05-30 LAB — CYTOLOGY - NON PAP

## 2021-05-30 LAB — SURGICAL PATHOLOGY

## 2021-06-14 ENCOUNTER — Ambulatory Visit (HOSPITAL_COMMUNITY)
Admission: RE | Admit: 2021-06-14 | Discharge: 2021-06-14 | Disposition: A | Discharge status: Home / Self Care | Payer: BC Managed Care – PPO | Source: Ambulatory Visit | Attending: Cardiology | Admitting: Cardiology

## 2021-06-14 ENCOUNTER — Other Ambulatory Visit: Payer: Self-pay

## 2021-06-14 ENCOUNTER — Ambulatory Visit (HOSPITAL_BASED_OUTPATIENT_CLINIC_OR_DEPARTMENT_OTHER)
Admission: RE | Admit: 2021-06-14 | Discharge: 2021-06-14 | Disposition: A | Discharge status: Home / Self Care | Payer: BC Managed Care – PPO | Source: Ambulatory Visit | Attending: Cardiology | Admitting: Cardiology

## 2021-06-14 VITALS — BP 101/60 | HR 59 | Wt 160.0 lb

## 2021-06-14 DIAGNOSIS — Z08 Encounter for follow-up examination after completed treatment for malignant neoplasm: Secondary | ICD-10-CM | POA: Insufficient documentation

## 2021-06-14 DIAGNOSIS — Z9012 Acquired absence of left breast and nipple: Secondary | ICD-10-CM | POA: Diagnosis not present

## 2021-06-14 DIAGNOSIS — Z9221 Personal history of antineoplastic chemotherapy: Secondary | ICD-10-CM | POA: Diagnosis not present

## 2021-06-14 DIAGNOSIS — C50412 Malignant neoplasm of upper-outer quadrant of left female breast: Secondary | ICD-10-CM

## 2021-06-14 DIAGNOSIS — C801 Malignant (primary) neoplasm, unspecified: Secondary | ICD-10-CM | POA: Diagnosis present

## 2021-06-14 DIAGNOSIS — Z853 Personal history of malignant neoplasm of breast: Secondary | ICD-10-CM | POA: Insufficient documentation

## 2021-06-14 DIAGNOSIS — Z0189 Encounter for other specified special examinations: Secondary | ICD-10-CM

## 2021-06-14 DIAGNOSIS — I429 Cardiomyopathy, unspecified: Secondary | ICD-10-CM | POA: Insufficient documentation

## 2021-06-14 LAB — ECHOCARDIOGRAM COMPLETE
Area-P 1/2: 2.99 cm2
Calc EF: 58.2 %
S' Lateral: 3.7 cm
Single Plane A2C EF: 58.9 %
Single Plane A4C EF: 57.1 %

## 2021-06-14 NOTE — Patient Instructions (Signed)
Stop Bisoprolol  Follow up: AS NEEDED

## 2021-06-14 NOTE — Progress Notes (Signed)
°  Echocardiogram 2D Echocardiogram has been performed.  Fidel Levy 06/14/2021, 11:57 AM

## 2021-06-15 NOTE — Progress Notes (Signed)
P  Advanced Heart Failure Clinic Note   Patient ID: Natasha Torres, female   DOB: 10-Apr-1981, 40 y.o.   MRN: [id number] Primary oncologist: Dr Lindi Adie HF: Dr Aundra Dubin.  40 y.o. with left breast cancer presents for evaluation of fall in EF and strain by echo monitoring.  Breast cancer was diagnosed in 2/16.  ER+/PR-/HER2+.  Treated with neoadjuvant chemo (carboplatin, docetaxel, Herceptin, and pertuzumab).  She was continued on Herceptin for a year.  She is s/p mastectomy.  She had an echo in 4/16 showed EF 55-60% with GLS -25%.  However, repeat echo in 6/16 during Herceptin treatment showed fall in EF to 50-55% with GLS fallen to -17%.  Echo in 8/16 showed EF down to 45%.  At that time, Herceptin was held for 6 weeks and later restarted.  Echo 4/17 showed EF 55-60% with GLS -24.7%.  She was able to complete her year of Herceptin with use of candesartan and Coreg.   In 2/22, she had a left-sided relapse, ER+/PR-/HER2+.  She was started on Kadcyla and had left lumpectomy. Plan for [place] until 12/22.  Initial echo in 6/22 showed EF 61% with GLS -19.4%.  Repeat echo in 9/22, however, showed EF 60-65%, GLS -15.1% with normal RV (less negative strain).   Echo was done today and reviewed, EF 78%, normal diastolic function, normal RV, GLS -21.7%.    She has been doing well, has finished her Kadcyla course.  No exertional dyspnea or chest pain.  She stopped candesartan due to fatigue.   Labs (7/16): K 4, creatinine 0.7 Labs (8/16): K 4.3, creatinine 0.64 Labs (9/16): K 3.7, creatinine 0.81 Labs (11/16): K 4.1, creatinine 0.7 Labs (4/17): K 4, creatinine 0.8 Labs (9/22): K 3.5, creatinine 0.77  PMH: 1. Breast cancer: On left, diagnosed 2/16.  ER+/PR-/HER2+.  Treated with neoadjuvant chemo (carboplatin, docetaxel, Herceptin, and pertuzumab).  She completed this treatment and finished 1 year Herceptin.  S/p mastectomy. She had a left-sided recurrence in 2/22, ER+/PR-/HER2+.  She started on Kadcyla and  had 6/22 left anterior lumpectomy.  12/22 oophorectomy.  2. 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  - Echo (4/16) with EF 55-60%, GLS -25%. - Echo (6/16) with EF 50-55%, GLS -17% - Echo (8/16) with EF 45%, GLS -18.6%, lateral s' 13.5 cm/sec - Echo (9/16) with EF 50-55%, no strain done, lateral s' 13.1 cm/sec - Echo (11/16) with EF 50%, GLS -15.8% (poor signal), lateral s' 14.3 cm/sec - Echo (1/17) with EF 55%, GLS -58.8%, normal diastolic function, normal RV size and systolic function.  - Echo (4/17) with EF 50-27%, normal diastolic function, GLS -74.1%.  - Echo (10/17) with EF 28%, normal diastolic function, GLS -78.6%, normal RV size and systolic function.  - Echo (6/22) with EF 61%, GLS -19.4% - Echo (9/22) with EF 60-65%, GLS -15.1%, RV normal.  - Echo (10/22) with EF 76-72%, normal diastolic function, normal RV, GLS -24.2% - Echo (12/22) with EF 09%, normal diastolic function, normal RV, GLS -21.7%.  Social History   Socioeconomic History   Marital status: Divorced    Spouse name: Harrell Gave   Number of children: 1   Years of education: Not on file   Highest education level: Not on file  Occupational History   Not on file  Tobacco Use   Smoking status: Never   Smokeless tobacco: Never  Vaping Use   Vaping Use: Never used  Substance and Sexual Activity   Alcohol use: Not Currently   Drug use: Yes  Types: Marijuana    Comment: Patient hasn't used in 2 years as of 05/24/21 per pt.   Sexual activity: Not Currently  Other Topics Concern   Not on file  Social History Narrative   ** Merged History Encounter **       Social Determinants of Health   Financial Resource Strain: Not on file  Food Insecurity: Not on file  Transportation Needs: Not on file  Physical Activity: Not on file  Stress: Not on file  Social Connections: Not on file   Family History  Problem Relation Age of Onset   Hypertension Mother    Anesthesia problems Mother        post-op N/V    Prostate cancer Father 73   Depression Maternal Aunt    Dementia Maternal Grandmother    Cancer Maternal Grandfather    Heart attack Maternal Grandfather    Dementia Paternal Grandmother    Kidney cancer Paternal Grandmother        slow growing, no treatment   Cancer Paternal Grandfather    Prostate cancer Paternal Grandfather 75   Bone cancer Paternal Grandfather 11   Breast cancer Paternal Grandfather 8   Lung cancer Paternal Grandfather        dx late 40s; smoker.  thought to be a 4th primary cancer   ROS: All systems reviewed and negative except as per HPI.  Current Outpatient Medications  Medication Sig Dispense Refill   Ashwagandha 500 MG CAPS Take by mouth. Patient takes at bedtime. Stopped taking on 05/22/21 due to upcoming surgery on 05/29/21.     Black Pepper-Turmeric 3-500 MG CAPS Take by mouth. Stopped  on 12/5 for 05/29/21 surgery.     Cholecalciferol (VITAMIN D3) 50 MCG (2000 UT) TABS Take 2,000 Units by mouth at bedtime.     Cyanocobalamin (B-12) 100 MCG TABS Take by mouth. Patient takes 1 tablet at lunch time.     folic acid (FOLVITE) 301 MCG tablet Take 400 mcg by mouth daily. Stopped on 12/5 until after 05/29/21 surgery.     letrozole (FEMARA) 2.5 MG tablet Take 2.5 mg by mouth daily.     LORazepam (ATIVAN) 0.5 MG tablet Take 1 tablet (0.5 mg total) by mouth every 6 (six) hours as needed for anxiety. 30 tablet 3   Magnesium Hydroxide 400 MG CHEW Chew 400 mg by mouth at bedtime. Stopped on 12/5 until 05/29/21 surgery.     melatonin 5 MG TABS Take 5 mg by mouth at bedtime as needed. Pt stopped on 12/5 for 05/29/21 surgery.     Multiple Vitamins-Minerals (ADULT ONE DAILY GUMMIES PO) Take 2 tablets by mouth at bedtime. Stopped 12/5 until 05/29/21 surgery.     NON FORMULARY Echinacea Tincture PRN . Pt only takes if she feels like she is coming down with something.     Turmeric 500 MG CAPS Take 500 mg by mouth at bedtime.     No current facility-administered medications  for this encounter.   BP 101/60    Pulse (!) 59    Wt 72.6 kg (160 lb)    SpO2 100%    BMI 22.32 kg/m - She can stop candesartan.  General: NAD Neck: No JVD, no thyromegaly or thyroid nodule.  Lungs: Clear to auscultation bilaterally with normal respiratory effort. CV: Nondisplaced PMI.  Heart regular S1/S2, no S3/S4, no murmur.  No peripheral edema.  No carotid bruit.  Normal pedal pulses.  Abdomen: Soft, nontender, no hepatosplenomegaly, no distention.  Skin: Intact without  lesions or rashes.  Neurologic: Alert and oriented x 3.  Psych: Normal affect. Extremities: No clubbing or cyanosis.  HEENT: Normal.   40 y.o. with history of breast cancer.  She had a transient fall in EF and strain with Herceptin back in 2017.  She was started candesartan and Coreg, and Herceptin was held.  EF improved back to normal range and she completed Herceptin therapy.  She now has a relapse and is going to be on Kadcyla until 12/22.  Strain was significantly less negative on 9/22 echo though EF remained normal.  I am concerned that the less negative strain was due to [place].  I started her back on bisoprolol and candesartan.  Echoes in 10/22 and 12/22 showed significantly improved strain and overall normal EF.  She has now completed Kadcyla.  - She has already stopped candesartan, can stop bisoprolol today.  - She will not need additional screening echoes.   Followup prn.    Loralie Champagne  06/15/2021

## 2021-07-05 NOTE — Progress Notes (Signed)
HEMATOLOGY-ONCOLOGY TELEPHONE VISIT PROGRESS NOTE  I connected with Natasha Torres on 07/06/2021 at 11:00 AM EST by telephone and verified that I am speaking with the correct person using two identifiers.  I discussed the limitations, risks, security and privacy concerns of performing an evaluation and management service by telephone and the availability of in person appointments.  I also discussed with the patient that there may be a patient responsible charge related to this service. The patient expressed understanding and agreed to proceed.   History of Present Illness: Natasha Torres is a 41 y.o. female with above-mentioned history of recurrent left breast cancer currently on chemotherapy with Kadcyla. She presents via telephone today for follow-up. Shes making huge changes in her lifestyle eating better and exercising and staying healthy.  She stopped drinking alcohol and does take additional supplements for her health.  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 Otherwise negative at APC, ATM, BARD1, BMPR1A, BRCA1, BRCA2, BRIP1, CHD1, CDK4, CDKN2A, CHEK2, MLH1, MSH2, MSH6, MUTYH, NBN, PALB2, PMS2, PTEN, RAD51C, RAD51D, SMAD4, STK11, and TP53   09/20/2014 Breast MRI   RIGHT: 1 x 0.8 x 0.9 cm lobulated border mass in the retroareolar lower slight lateral area. LEFT: hematoma with surrounding adjacent enhancement encompassing a 4.6 x 4.9 x 4.1 cm area.     09/27/2014 - 01/10/2015 Neo-Adjuvant Chemotherapy   Neoadjuvant TCH Perjeta every 3 week 6 followed by Herceptin maintenance completed 09/21/2015   01/13/2015 Breast MRI   Postsurgical changes in left breast without residual enhancing masses compatible with treatment response   02/10/2015 Definitive Surgery   Bilateral mastectomies: RIGHT: benign.  LEFT: complete path response;  0/3 sentinel nodes   02/10/2015 Pathologic Stage   ypT0 ypN0 ypM0   02/22/2015 -  Anti-estrogen oral therapy   Zoladex with tamoxifen, stopped Zoladex for intolerance, decreased tamoxifen to 10 mg daily.   11/17/2015 Survivorship   Survivorship care plan completed and given to patient    Relapse/Recurrence   Within the upper-outer left breast anterior depth there are 2 adjacent irregular enhancing masses (2.1 cm and 1.5 cm) compatible with biopsy-proven malignancy. Indeterminate enhancing mass within the outer left breast posterior depth (1.8 cm). Indeterminate enhancing mass within the outer lower posterior right breast. 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 ER 80%, PR 0%, Her 2 Positive   08/12/2020 Cancer Staging   Staging form: Breast, AJCC 8th Edition - Pathologic stage from 08/12/2020: Stage IA (pT1b, pN0, cM0, G2, ER+, PR-, HER2+) - Signed by Gardenia Phlegm, NP on 08/24/2020 Stage prefix: Initial diagnosis Histologic grading system: 3 grade system    09/07/2020 -  Chemotherapy   Patient is on Treatment Plan : BREAST ADO-Trastuzumab Emtansine ([place]) q21d     11/29/2020 Surgery   Left anterior lumpectomy: Benign, left posterior lumpectomy: Benign, left posterior tissue lateral margin  excision: Benign with 3 intramammary lymph nodes negative for malignancy.  Left posterior tissue superior margin: Benign     Observations/Objective:     Assessment Plan:  Breast cancer of upper-outer quadrant of left female breast 08/13/2014: Left breast IDC. Neo  adj chemo with TCHP foll by Herceptin maintenance (path CR: Bil mastectomies and reconstruction), Tamoxifen (stoped and started due to intolerance) ATM gene mutation   08/12/20 Relapsed Breast Cancer: Left Breast UOQ 2.1 cm and 1.5 cm. Indeterminate 1.8 cm and Right breast indeterminate 1.5 cm.  Biopsy revealed IDC with DCIS, ER 80%, PR 0%, HER-2 3+ IHC positive   Bone scans 08/22/2020: Results awaited CT scans: scheduled for 08/24/20 Left breast Biopsy 3 o clock:Grade 2-3 IMC with lobular features ER 60%, PR 10%, Ki-67 20%, HER-2 3+ Rt Breast Biopsy: Benign   Treatment plan: 1.  chemotherapy with Kadcyla for currently unresectable breast cancer 4 cycles completed 11/10/2020 2. Surgery 11/29/2020: Pathologic complete response 3. Followed by XRT (have to discuss the pros and cons of radiation given ATM mutation) 4. Further adjuvant antiestrogen therapy (ovarian suppression or oophorectomy plus Letrozole) Zoladex monthly to her treatment plan since 09/07/20 ------------------------------------------------------------------ Antiestrogen therapy with Zoladex with Letrozole (now Letrozole alone since oophorectomy 05/29/21) Echo on 6/10: EF 61%   Severe fatigue: improved Sleep:from stress Abnormal heart strain: ECHO: EF 55% 06/14/22 Anxiety: Seeing a counselor since July [facility])  Healthy lifestyle: Patient has made tremendous changes in her lifestyle and is extremely careful with her diet and exercise was.  She quit alcohol and meat and is on a vegetarian diet.   05/29/21:Salpingo oophorectomy: mild Hot flashes Current Treatment: Letrozole  We will plan to do a breast MRI in April 2023. Telephone visit day after the breast MRI to discuss results.    I discussed the assessment and treatment plan with the patient. The patient was provided an opportunity to ask questions and all were answered. The patient agreed with the plan and demonstrated an understanding of the instructions. The  patient was advised to call back or seek an in-person evaluation if the symptoms worsen or if the condition fails to improve as anticipated.   Total time spent: 12 mins including non-face to face time and time spent for planning, charting and coordination of care  Rulon Eisenmenger, MD 07/06/2021    I, Thana Ates, am acting as scribe for Nicholas Lose, MD.  I have reviewed the above documentation for accuracy and completeness, and I agree with the above.

## 2021-07-05 NOTE — Assessment & Plan Note (Signed)
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 Surgery 11/29/2020: Pathologic complete response 3. Followed by Jeanie Cooks to discuss the pros and cons of radiation given ATM mutation) 4.Further adjuvant antiestrogen therapy (ovarian suppression or oophorectomy plusLetrozole) Zoladex monthly to her treatment plan ------------------------------------------------------------------ Treatment plan: Continue maintenance Kadcyla for 1 year (started 09/07/2020). Antiestrogen therapy with Zoladex withLetrozole Echo on 6/10: EF 61%  Kadcyla toxicities: Elevated liver enzymes: resolved since dose reduction Fatigue: Severe fatigue.  Therefore we reduced the dosage of Kadcyla further today. Abnormal heart strain: repeat echo  And f/u with Dr. Aundra Dubin Anxiety: She is very anxious about recurrence of breast cancer  Return to clinic in January 2023 to discuss Neratinib treatment.

## 2021-07-06 ENCOUNTER — Inpatient Hospital Stay: Payer: BC Managed Care – PPO | Attending: Hematology and Oncology | Admitting: Hematology and Oncology

## 2021-07-06 DIAGNOSIS — Z170 Estrogen receptor positive status [ER+]: Secondary | ICD-10-CM

## 2021-07-06 DIAGNOSIS — C50412 Malignant neoplasm of upper-outer quadrant of left female breast: Secondary | ICD-10-CM

## 2021-09-11 ENCOUNTER — Encounter: Payer: Self-pay | Admitting: Hematology and Oncology

## 2021-09-20 NOTE — Progress Notes (Signed)
?HEMATOLOGY-ONCOLOGY TELEPHONE VISIT PROGRESS NOTE ? ?I connected with _0 @ on 10/02/21 at 11:00 AM EDT by telephone and verified that I am speaking with the correct person using two identifiers.  ?I discussed the limitations, risks, security and privacy concerns of performing an evaluation and management service by telephone and the availability of in person appointments.  ?I also discussed with the patient that there may be a patient responsible charge related to this service. The patient expressed understanding and agreed to proceed.  ? ?CHIEF COMPLIANT: Left breast cancer ? ?History of Present Illness: Natasha Torres is a 41 y.o. female with above-mentioned history of recurrent left breast cancer currently on chemotherapy with Kadcyla. She presents via telephone today for follow-up. ? ? ?Oncology History  ?Breast cancer of upper-outer quadrant of left female breast ([place])  ?08/13/2014 Mammogram  ? Left breast: 2 cm circumscribed mass ? ?  ?08/13/2014 Breast US  ? Left breast: two masses: #1: 1:00 10 x 9 x 13 mmm irregular; #2: 2:00: 16 x 7 x 14 mm; no LAD ? ?  ?08/27/2014 Initial Diagnosis  ? O/S excisional biopsy: 2 masses showing 1.5 cm and 0.9 cm IDC, Grade 3, ER + (90%), PR- (0%), HER-2 positive (ratio 2.5), Ki67 ~30%, Multifocal ? ?  ?08/27/2014 Clinical Stage  ? Stage IIA/IIB: T2/T3 N0 ? ?  ?09/17/2014 Procedure  ? 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 Otherwise negative at APC, ATM, BARD1, BMPR1A, BRCA1, BRCA2, BRIP1, CHD1, CDK4, CDKN2A, CHEK2, MLH1, MSH2, MSH6, MUTYH, NBN, PALB2, PMS2, PTEN, RAD51C, RAD51D, SMAD4, STK11, and TP53 ? ?  ?09/20/2014 Breast MRI  ? RIGHT: 1 x 0.8 x 0.9 cm lobulated border mass in the retroareolar lower slight lateral area. LEFT: hematoma with surrounding adjacent enhancement encompassing a 4.6 x 4.9 x 4.1 cm area.  ? ?  ?09/27/2014 - 01/10/2015 Neo-Adjuvant Chemotherapy  ? Neoadjuvant TCH Perjeta every 3 week ?6 followed by Herceptin maintenance  completed 09/21/2015 ? ?  ?01/13/2015 Breast MRI  ? Postsurgical changes in left breast without residual enhancing masses compatible with treatment response ? ?  ?02/10/2015 Definitive Surgery  ? Bilateral mastectomies: RIGHT: benign.  LEFT: complete path response;  0/3 sentinel nodes ? ?  ?02/10/2015 Pathologic Stage  ? ypT0 ypN0 ypM0 ? ?  ?02/22/2015 -  Anti-estrogen oral therapy  ? Zoladex with tamoxifen, stopped Zoladex for intolerance, decreased tamoxifen to 10 mg daily. ?  ?11/17/2015 Survivorship  ? Survivorship care plan completed and given to patient ? ?  ? Relapse/Recurrence  ? Within the upper-outer left breast anterior depth there are 2 ?adjacent irregular enhancing masses (2.1 cm and 1.5 cm) compatible with biopsy-proven malignancy. Indeterminate enhancing mass within the outer left breast posterior depth (1.8 cm). Indeterminate enhancing mass within the outer lower posterior right breast. (1.5 cm) ?  ?08/12/2020 Pathology Results  ? 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 ER 80%, PR 0%, Her 2 Positive ?  ?08/12/2020 Cancer Staging  ? Staging form: Breast, AJCC 8th Edition ?- Pathologic stage from 08/12/2020: Stage IA (pT1b, pN0, cM0, G2, ER+, PR-, HER2+) - Signed by Gardenia Phlegm, NP on 08/24/2020 ?Stage prefix: Initial diagnosis ?Histologic grading system: 3 grade system ? ?  ?09/07/2020 -  Chemotherapy  ? Patient is on Treatment Plan : BREAST ADO-Trastuzumab Emtansine (Kadcyla) q21d  ? ?  ?  ?11/29/2020 Surgery  ? Left anterior lumpectomy: Benign, left posterior lumpectomy: Benign, left posterior tissue lateral margin excision: Benign with 3 intramammary lymph nodes negative for  malignancy.  Left posterior tissue superior margin: Benign ?  ? ? ?REVIEW OF SYSTEMS:   ?Constitutional: Denies fevers, chills or abnormal weight loss ? ?All other systems were reviewed with the patient and are negative. ?Observations/Objective:  ? ?  ?Assessment Plan:  ?Breast cancer of upper-outer quadrant of left female  breast ?08/13/2014: Left breast IDC. Neo adj chemo with TCHP foll by Herceptin maintenance (path CR: Bil mastectomies and reconstruction), Tamoxifen (stoped and started due to intolerance) ?ATM gene mutation ?  ?08/12/20 Relapsed Breast Cancer: Left Breast UOQ 2.1 cm and 1.5 cm. Indeterminate 1.8 cm and Right breast indeterminate 1.5 cm.  Biopsy revealed IDC with DCIS, ER 80%, PR 0%, HER-2 3+ IHC positive ? ?CT scans: scheduled for 08/24/20 ?Left breast Biopsy 3 o clock:Grade 2-3 IMC with lobular features ER 60%, PR 10%, Ki-67 20%, HER-2 3+ ?Rt Breast Biopsy: Benign ?  ?Treatment plan: ?1.  chemotherapy with Kadcyla X 4 cycles completed 11/10/2020 ?2. Surgery 11/29/2020: Pathologic complete response ?3. Followed by XRT (have to discuss the pros and cons of radiation given ATM mutation) ?4. Further adjuvant antiestrogen therapy (ovarian suppression or oophorectomy plus Letrozole) ?Zoladex monthly to her treatment plan since 09/07/20 ?------------------------------------------------------------------ ?Antiestrogen therapy with Zoladex with Letrozole (now Letrozole alone since oophorectomy 05/29/21) ?Echo on 6/10: EF 61% ?  ?Severe fatigue: improved ?Sleep:from stress ?Abnormal heart strain: ECHO: EF 55% 06/14/22 ?Anxiety: Seeing a counselor since July [facility]) ?  ?Healthy lifestyle: Patient has made tremendous changes in her lifestyle and is extremely careful with her diet and exercise was.  She quit alcohol and meat and is on a vegetarian diet. ?  ?05/29/21:Salpingo oophorectomy: mild Hot flashes ? ?Current Treatment: Letrozole ?Better than Tam ?Min hot flashes ?Hair shedding ? ?Breast MRI 09/27/2021: Benign, normal lymph nodes ? ?Follow-up in 1 year  ? ? ? ?I discussed the assessment and treatment plan with the patient. The patient was provided an opportunity to ask questions and all were answered. The patient agreed with the plan and demonstrated an understanding of the instructions. The patient was advised to  call back or seek an in-person evaluation if the symptoms worsen or if the condition fails to improve as anticipated.  ? ?I provided 12 minutes of non-face-to-face time during this encounter. Harriette Ohara, MD   ?Earlie Server am scribing for Dr. Lindi Adie ? ?I have reviewed the above documentation for accuracy and completeness, and I agree with the above. ?  ?

## 2021-09-26 ENCOUNTER — Encounter (HOSPITAL_COMMUNITY): Payer: Self-pay

## 2021-09-27 ENCOUNTER — Ambulatory Visit
Admission: RE | Admit: 2021-09-27 | Discharge: 2021-09-27 | Disposition: A | Discharge status: Home / Self Care | Payer: BC Managed Care – PPO | Source: Ambulatory Visit | Attending: Hematology and Oncology | Admitting: Hematology and Oncology

## 2021-09-27 DIAGNOSIS — Z170 Estrogen receptor positive status [ER+]: Secondary | ICD-10-CM

## 2021-09-27 MED ORDER — GADOBUTROL 1 MMOL/ML IV SOLN
8.0000 mL | Freq: Once | INTRAVENOUS | Status: AC | PRN
  Administered 2021-09-27: 8 mL via INTRAVENOUS

## 2021-10-02 ENCOUNTER — Inpatient Hospital Stay: Payer: BC Managed Care – PPO | Attending: Hematology and Oncology | Admitting: Hematology and Oncology

## 2021-10-02 ENCOUNTER — Telehealth: Payer: Self-pay

## 2021-10-02 DIAGNOSIS — C50412 Malignant neoplasm of upper-outer quadrant of left female breast: Secondary | ICD-10-CM | POA: Diagnosis not present

## 2021-10-02 DIAGNOSIS — Z170 Estrogen receptor positive status [ER+]: Secondary | ICD-10-CM

## 2021-10-02 NOTE — Telephone Encounter (Signed)
-----   Message from Gardenia Phlegm, NP sent at 10/01/2021 10:22 PM EDT ----- ?MRI breast neg for malignancy.  No evidence of malignancy. ?----- Message ----- ?From: Interface, Rad Results In ?Sent: 09/27/2021  11:22 AM EDT ?To: Nicholas Lose, MD ? ? ?

## 2021-10-02 NOTE — Assessment & Plan Note (Signed)
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 Neo adj chemo with TCHP foll by Herceptin maintenance (path CR: Bil mastectomies and reconstruction), Tamoxifen (stoped and started due to intolerance) ?ATM gene mutation ?? ?08/12/20 Relapsed Breast Cancer: Left Breast UOQ?2.1 cm and 1.5 cm. Indeterminate 1.8 cm and Right breast indeterminate 1.5 cm.??Biopsy revealed IDC with DCIS, ER 80%, PR 0%, HER-2 3+ IHC positive ?? ?Bone scans 08/22/2020: Results awaited ?CT scans: scheduled for 08/24/20 ?Left breast Biopsy 3 o clock:Grade 2-3?[facility] with lobular features?ER 60%, PR 10%, Ki-67 20%, HER-2 3+ ?Rt Breast Biopsy: Benign ?? ?Treatment plan: ?1.??chemotherapy with Kadcyla?X 4 cycles completed 11/10/2020 ?2. Surgery 11/29/2020: Pathologic complete response ?3. Followed by XRT?(have to discuss the pros and cons of radiation given ATM mutation) ?4.?Further adjuvant antiestrogen therapy (ovarian suppression or oophorectomy plus?Letrozole) ?Zoladex monthly to her treatment plan since 09/07/20 ?------------------------------------------------------------------ ?Antiestrogen therapy with Zoladex with?Letrozole (now Letrozole alone since oophorectomy 05/29/21) ?Echo on 6/10: EF 61% ?? ?Severe fatigue: improved ?Sleep:from stress ?Abnormal heart strain: ECHO: EF 55% 06/14/22 ?Anxiety: Seeing a counselor since July [facility]) ?? ?Healthy lifestyle: Patient has made tremendous changes in her lifestyle and is extremely careful with her diet and exercise was.  She quit alcohol and meat and is on a vegetarian diet. ?? ?05/29/21:Salpingo oophorectomy: mild Hot flashes ?Current Treatment: Letrozole ?? ?breast MRI 09/27/2021: Benign, normal lymph nodes ? ?Follow-up in 1 year after another breast MRI ?

## 2021-10-02 NOTE — Telephone Encounter (Signed)
Called and spoke with pt, pt aware of results from Breast MRI via mychart results.  ?

## 2021-12-12 ENCOUNTER — Other Ambulatory Visit: Payer: Self-pay | Admitting: Hematology and Oncology

## 2022-01-08 ENCOUNTER — Other Ambulatory Visit: Payer: Self-pay

## 2022-01-16 ENCOUNTER — Other Ambulatory Visit: Payer: Self-pay

## 2022-03-27 ENCOUNTER — Other Ambulatory Visit: Payer: Self-pay | Admitting: Hematology and Oncology

## 2022-03-27 NOTE — Telephone Encounter (Signed)
Pt does not have a PCP yet and requesting a refill until PCP is found.

## 2022-05-14 ENCOUNTER — Telehealth: Payer: BC Managed Care – PPO | Admitting: Physician Assistant

## 2022-05-14 DIAGNOSIS — J020 Streptococcal pharyngitis: Secondary | ICD-10-CM

## 2022-05-14 MED ORDER — CEFDINIR 300 MG PO CAPS: 300.0000 mg | ORAL_CAPSULE | Freq: Two times a day (BID) | ORAL | 0 refills | Status: DC

## 2022-05-14 NOTE — Patient Instructions (Signed)
[name], thank you for joining Mar Daring, PA-C for today's virtual visit.  While this provider is not your primary care provider (PCP), if your PCP is located in our provider database this encounter information will be shared with them immediately following your visit.   [facility] account gives you access to today's visit and all your visits, tests, and labs performed at [facility] " click here if you don't have a [facility] account or go to [email or web address]  Consent: (Patient) BERLINE SEMRAD provided verbal consent for this virtual visit at the beginning of the encounter.  Current Medications:  Current Outpatient Medications:    cefdinir (OMNICEF) 300 MG capsule, Take 1 capsule (300 mg total) by mouth 2 (two) times daily., Disp: 20 capsule, Rfl: 0   Ashwagandha 500 MG CAPS, Take by mouth. Patient takes at bedtime. Stopped taking on 05/22/21 due to upcoming surgery on 05/29/21., Disp: , Rfl:    Black Pepper-Turmeric 3-500 MG CAPS, Take by mouth. Stopped  on 12/5 for 05/29/21 surgery., Disp: , Rfl:    Cholecalciferol (VITAMIN D3) 50 MCG (2000 UT) TABS, Take 2,000 Units by mouth at bedtime., Disp: , Rfl:    Cyanocobalamin (B-12) 100 MCG TABS, Take by mouth. Patient takes 1 tablet at lunch time., Disp: , Rfl:    folic acid (FOLVITE) 496 MCG tablet, Take 400 mcg by mouth daily. Stopped on 12/5 until after 05/29/21 surgery., Disp: , Rfl:    letrozole (FEMARA) 2.5 MG tablet, TAKE 1 TABLET BY MOUTH EVERY DAY, Disp: 90 tablet, Rfl: 3   LORazepam (ATIVAN) 0.5 MG tablet, TAKE 1 TABLET BY MOUTH EVERY 6 HOURS AS NEEDED FOR ANXIETY., Disp: 30 tablet, Rfl: 3   Magnesium Hydroxide 400 MG CHEW, Chew 400 mg by mouth at bedtime. Stopped on 12/5 until 05/29/21 surgery., Disp: , Rfl:    melatonin 5 MG TABS, Take 5 mg by mouth at bedtime as needed. Pt stopped on 12/5 for 05/29/21 surgery., Disp: , Rfl:    Multiple Vitamins-Minerals (ADULT ONE DAILY  GUMMIES PO), Take 2 tablets by mouth at bedtime. Stopped 12/5 until 05/29/21 surgery., Disp: , Rfl:    NON FORMULARY, Echinacea Tincture PRN . Pt only takes if she feels like she is coming down with something., Disp: , Rfl:    Medications ordered in this encounter:  Meds ordered this encounter  Medications   cefdinir (OMNICEF) 300 MG capsule    Sig: Take 1 capsule (300 mg total) by mouth 2 (two) times daily.    Dispense:  20 capsule    Refill:  0    Order Specific Question:   Supervising Provider    Answer:   Chase Picket [id number]     *If you need refills on other medications prior to your next appointment, please contact your pharmacy*  Follow-Up: Call back or seek an in-person evaluation if the symptoms worsen or if the condition fails to improve as anticipated.  [place] [phone]  Other Instructions  Strep Throat, Adult Strep throat is an infection in the throat that is caused by bacteria. It is common during the cold months of the year. It mostly affects children who are 33-34 years old. However, people of all ages can get it at any time of the year. This infection spreads from person to person (is contagious) through coughing, sneezing, or having close contact. Your health care provider may use other names to describe the infection. When strep throat  affects the tonsils, it is called tonsillitis. When it affects the back of the throat, it is called pharyngitis. What are the causes? This condition is caused by the Streptococcus pyogenes bacteria. What increases the risk? You are more likely to develop this condition if: You care for school-age children, or are around school-age children. Children are more likely to get strep throat and may spread it to others. You spend time in crowded places where the infection can spread easily. You have close contact with someone who has strep throat. What are the signs or symptoms? Symptoms of this condition  include: Fever or chills. Redness, swelling, or pain in the tonsils or throat. Pain or difficulty when swallowing. White or yellow spots on the tonsils or throat. Tender glands in the neck and under the jaw. Bad smelling breath. Red rash all over the body. This is rare. How is this diagnosed? This condition is diagnosed by tests that check for the presence and the amount of bacteria that cause strep throat. They are: Rapid strep test. Your throat is swabbed and checked for the presence of bacteria. Results are usually ready in minutes. Throat culture test. Your throat is swabbed. The sample is placed in a cup that allows infections to grow. Results are usually ready in 1 or 2 days. How is this treated? This condition may be treated with: Medicines that kill germs (antibiotics). Medicines that relieve pain or fever. These include: Ibuprofen or acetaminophen. Aspirin, only for people who are over the age of 38. Throat lozenges. Throat sprays. Follow these instructions at home: Medicines  Take over-the-counter and prescription medicines only as told by your health care provider. Take your antibiotic medicine as told by your health care provider. Do not stop taking the antibiotic even if you start to feel better. Eating and drinking  If you have trouble swallowing, try eating soft foods until your sore throat feels better. Drink enough fluid to keep your urine pale yellow. To help relieve pain, you may have: Warm fluids, such as soup and tea. Cold fluids, such as frozen desserts or popsicles. General instructions Gargle with a salt-water mixture 3-4 times a day or as needed. To make a salt-water mixture, completely dissolve -1 tsp (3-6 g) of salt in 1 cup (237 mL) of warm water. Get plenty of rest. Stay home from work or school until you have been taking antibiotics for 24 hours. Do not use any products that contain nicotine or tobacco. These products include cigarettes, chewing  tobacco, and vaping devices, such as e-cigarettes. If you need help quitting, ask your health care provider. It is up to you to get your test results. Ask your health care provider, or the department that is doing the test, when your results will be ready. Keep all follow-up visits. This is important. How is this prevented?  Do not share food, drinking cups, or personal items that could cause the infection to spread to other people. Wash your hands often with soap and water for at least 20 seconds. If soap and water are not available, use hand sanitizer. Make sure that all people in your house wash their hands well. Have family members tested if they have a sore throat or fever. They may need an antibiotic if they have strep throat. Contact a health care provider if: You have swelling in your neck that keeps getting bigger. You develop a rash, cough, or earache. You cough up a thick mucus that is green, yellow-brown, or bloody. You have  pain or discomfort that does not get better with medicine. Your symptoms seem to be getting worse. You have a fever. Get help right away if: You have new symptoms, such as vomiting, severe headache, stiff or painful neck, chest pain, or shortness of breath. You have severe throat pain, drooling, or changes in your voice. You have swelling of the neck, or the skin on the neck becomes red and tender. You have signs of dehydration, such as tiredness (fatigue), dry mouth, and decreased urination. You become increasingly sleepy, or you cannot wake up completely. Your joints become red or painful. These symptoms may represent a serious problem that is an emergency. Do not wait to see if the symptoms will go away. Get medical help right away. Call your local emergency services (911 in the [place] Do not drive yourself to the hospital. Summary Strep throat is an infection in the throat that is caused by the Streptococcus pyogenes bacteria. This infection is spread from  person to person (is contagious) through coughing, sneezing, or having close contact. Take your medicines, including antibiotics, as told by your health care provider. Do not stop taking the antibiotic even if you start to feel better. To prevent the spread of germs, wash your hands well with soap and water. Have others do the same. Do not share food, drinking cups, or personal items. Get help right away if you have new symptoms, such as vomiting, severe headache, stiff or painful neck, chest pain, or shortness of breath. This information is not intended to replace advice given to you by your health care provider. Make sure you discuss any questions you have with your health care provider. Document Revised: 09/27/2020 Document Reviewed: 09/27/2020 Elsevier Patient Education  [place].    If you have been instructed to have an in-person evaluation today at a local Urgent Care facility, please use the link below. It will take you to a list of all of our available [facility], including address, phone number and hours of operation. Please do not delay care.  [facility]  If you or a family member do not have a primary care provider, use the link below to schedule a visit and establish care. When you choose a [facility] primary care physician or advanced practice provider, you gain a long-term partner in health. Find a Primary Care Provider  Learn more about 's in-office and virtual care options: [facility]

## 2022-05-14 NOTE — Progress Notes (Signed)
Virtual Visit Consent   Natasha Torres, you are scheduled for a virtual visit with a [facility] provider today. Just as with appointments in the office, your consent must be obtained to participate. Your consent will be active for this visit and any virtual visit you may have with one of our providers in the next 365 days. If you have a MyChart account, a copy of this consent can be sent to you electronically.  As this is a virtual visit, video technology does not allow for your provider to perform a traditional examination. This may limit your provider's ability to fully assess your condition. If your provider identifies any concerns that need to be evaluated in person or the need to arrange testing (such as labs, EKG, etc.), we will make arrangements to do so. Although advances in technology are sophisticated, we cannot ensure that it will always work on either your end or our end. If the connection with a video visit is poor, the visit may have to be switched to a telephone visit. With either a video or telephone visit, we are not always able to ensure that we have a secure connection.  By engaging in this virtual visit, you consent to the provision of healthcare and authorize for your insurance to be billed (if applicable) for the services provided during this visit. Depending on your insurance coverage, you may receive a charge related to this service.  I need to obtain your verbal consent now. Are you willing to proceed with your visit today? Natasha Torres has provided verbal consent on 05/14/2022 for a virtual visit (video or telephone). Mar Daring, PA-C  Date: 05/14/2022 2:20 PM  Virtual Visit via Video Note   I, Mar Daring, connected with  Natasha Torres  ([id number], 27-Oct-1980) on 05/14/22 at  2:15 PM EST by a video-enabled telemedicine application and verified that I am speaking with the correct person using two identifiers.  Location: Patient: Virtual Visit  Location Patient: Home Provider: Virtual Visit Location Provider: Home Office   I discussed the limitations of evaluation and management by telemedicine and the availability of in person appointments. The patient expressed understanding and agreed to proceed.    History of Present Illness: Natasha Torres is a 41 y.o. who identifies as a female who was assigned female at birth, and is being seen today for sore throat.  HPI: Sore Throat  This is a new problem. The current episode started in the past 7 days (Saturday). The problem has been gradually worsening. There has been no fever. Associated symptoms include headaches, swollen glands and trouble swallowing. Pertinent negatives include no congestion, coughing, drooling, hoarse voice or shortness of breath. Associated symptoms comments: Lethargy, chills. She has had exposure to strep. Exposure to: daughter diagnosed today. She has tried acetaminophen for the symptoms. The treatment provided no relief.      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 Patient takes at bedtime. Stopped taking on 05/22/21 due to upcoming  surgery on 05/29/21., Disp: , Rfl:    Black Pepper-Turmeric 3-500 MG CAPS, Take by mouth. Stopped  on 12/5 for 05/29/21 surgery., Disp: , Rfl:    Cholecalciferol (VITAMIN D3) 50 MCG (2000 UT) TABS, Take 2,000 Units by mouth at bedtime., Disp: , Rfl:    Cyanocobalamin (B-12) 100 MCG TABS, Take by mouth. Patient takes 1 tablet at lunch time., Disp: , Rfl:    folic acid (FOLVITE) 149 MCG tablet, Take 400 mcg by mouth daily. Stopped on 12/5 until after 05/29/21 surgery., Disp: , Rfl:    letrozole (FEMARA) 2.5 MG tablet, TAKE 1 TABLET BY MOUTH EVERY DAY, Disp: 90 tablet, Rfl: 3   LORazepam (ATIVAN) 0.5 MG tablet, TAKE 1 TABLET BY MOUTH EVERY 6 HOURS AS NEEDED FOR ANXIETY., Disp: 30 tablet, Rfl: 3   Magnesium Hydroxide 400 MG CHEW, Chew 400 mg by mouth at bedtime. Stopped on 12/5 until 05/29/21 surgery., Disp: , Rfl:    melatonin 5 MG TABS, Take 5 mg by mouth at bedtime as needed. Pt stopped on 12/5 for 05/29/21 surgery., Disp: , Rfl:    Multiple Vitamins-Minerals (ADULT ONE DAILY GUMMIES PO), Take 2 tablets by mouth at bedtime. Stopped 12/5 until 05/29/21 surgery., Disp: , Rfl:    NON FORMULARY, Echinacea Tincture PRN . Pt only takes if she feels like she is coming down with something., Disp: , Rfl:   Observations/Objective: Patient is well-developed, well-nourished in no acute distress.  Resting comfortably at home.  Head is normocephalic, atraumatic.  No labored breathing.  Speech is clear and coherent with logical content.  Patient is alert and oriented at baseline.    Assessment and Plan: 1. Strep throat - cefdinir (OMNICEF) 300 MG capsule; Take 1 capsule (300 mg total) by mouth 2 (two) times daily.  Dispense: 20 capsule; Refill: 0  - Suspect strep throat - Omnicef prescribed - Tylenol and Ibuprofen alternating every 4 hours - Salt water gargles - Chloraseptic spray - Liquid and soft food diet - Push fluids - New toothbrush in 3 days - Seek in person evaluation if not improving or if  symptoms worsen   Follow Up Instructions: I discussed the assessment and treatment plan with the patient. The patient was provided an opportunity to ask questions and all were answered. The patient agreed with the plan and demonstrated an understanding of the instructions.  A copy of instructions were sent to the patient via MyChart unless otherwise noted below.    The patient was advised to call back or seek an in-person evaluation if the symptoms worsen or if the condition fails to improve as anticipated.  Time:  I spent 10 minutes with the patient via telehealth technology discussing the above problems/concerns.    Mar Daring, PA-C

## 2022-05-16 ENCOUNTER — Encounter: Payer: Self-pay | Admitting: Hematology and Oncology

## 2022-05-16 DIAGNOSIS — Z170 Estrogen receptor positive status [ER+]: Secondary | ICD-10-CM

## 2022-05-17 ENCOUNTER — Encounter: Payer: Self-pay | Admitting: Hematology and Oncology

## 2022-05-17 ENCOUNTER — Encounter: Payer: Self-pay | Admitting: *Deleted

## 2022-05-17 NOTE — Progress Notes (Signed)
Per MD request referral placed to Dr. Bryan Lemma with [facility] for ATM gene mutation.

## 2022-05-18 ENCOUNTER — Telehealth: Payer: Self-pay | Admitting: Hematology and Oncology

## 2022-05-18 NOTE — Telephone Encounter (Signed)
Called patient to schedule gen 60. Patient notified and forwarding chart to follow up.

## 2022-06-05 ENCOUNTER — Other Ambulatory Visit: Payer: Self-pay | Admitting: Pharmacist

## 2022-07-05 ENCOUNTER — Other Ambulatory Visit: Payer: Self-pay

## 2022-07-05 ENCOUNTER — Inpatient Hospital Stay: Payer: BC Managed Care – PPO

## 2022-07-05 ENCOUNTER — Inpatient Hospital Stay: Payer: BC Managed Care – PPO | Attending: Nurse Practitioner | Admitting: Genetic Counselor

## 2022-07-05 DIAGNOSIS — Z1589 Genetic susceptibility to other disease: Secondary | ICD-10-CM | POA: Diagnosis not present

## 2022-07-05 DIAGNOSIS — Z1509 Genetic susceptibility to other malignant neoplasm: Secondary | ICD-10-CM | POA: Diagnosis not present

## 2022-07-05 DIAGNOSIS — Z853 Personal history of malignant neoplasm of breast: Secondary | ICD-10-CM

## 2022-07-05 DIAGNOSIS — Z1501 Genetic susceptibility to malignant neoplasm of breast: Secondary | ICD-10-CM

## 2022-07-10 ENCOUNTER — Encounter: Payer: Self-pay | Admitting: Genetic Counselor

## 2022-07-10 ENCOUNTER — Encounter: Payer: Self-pay | Admitting: Hematology and Oncology

## 2022-07-10 DIAGNOSIS — Z1501 Genetic susceptibility to malignant neoplasm of breast: Secondary | ICD-10-CM | POA: Insufficient documentation

## 2022-07-10 NOTE — Progress Notes (Signed)
REFERRING PROVIDER: Nicholas Lose, MD [place],  [place] [address]  PRIMARY PROVIDER:  [name], MD  PRIMARY REASON FOR VISIT:  1. Monoallelic mutation of ATM gene     HISTORY OF PRESENT ILLNESS:   Ms. Hackmann, a 42 y.o. female, was seen for a [facility] cancer genetics consultation at the request of Dr. Lindi Adie due to a personal history of an ATM gene mutation.  Ms. Schubring presents to clinic today to review updates on ATM.    In 2016, at the age of 60, Ms. 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  Around the time of her diagnosis, she was found to have a mutation in the ATM gene called c.5674+1G>T.  No other pathogenic variants were detected int he Myriad MyRisk Panel, which included sequencing and large rearrangement analyses of the following genes: APC, ATM, BARD1, BMPR1A, BRCA1, BRCA2, BRIP1, CDH1, CDK4, CDKN2A, CHEK2, EPCAM (large rearrangement only), MLH1, MSH2, MSH6, MUTYH, NBN, PALB2, PMS2, PTEN, RAD51C, RAD51D, SMAD4, STK11, and TP53.  Report date was October 01, 2014.     In 2022, at the age of 39, she had a breast cancer recurrence.   CANCER HISTORY:   Oncology History  Breast cancer of upper-outer quadrant of left female breast ([place])  08/13/2014 Mammogram   Left breast: 2 cm circumscribed mass   08/13/2014 Breast US   Left breast: two masses: #1: 1:00 10 x 9 x 13 mmm irregular; #2: 2:00: 16 x 7 x 14 mm; no LAD   08/27/2014 Initial Diagnosis   O/S excisional biopsy: 2 masses showing 1.5 cm and 0.9 cm IDC, Grade 3, ER + (90%), PR- (0%), HER-2 positive (ratio 2.5), Ki67 ~30%, Multifocal   08/27/2014 Clinical Stage   Stage IIA/IIB: T2/T3 N0   09/17/2014 Procedure   MyRisk panel (Myriad) 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 Otherwise negative at APC, ATM, BARD1, BMPR1A, BRCA1, BRCA2, BRIP1, CHD1, CDK4, CDKN2A, CHEK2, MLH1, MSH2, MSH6, MUTYH, NBN, PALB2, PMS2,  PTEN, RAD51C, RAD51D, SMAD4, STK11, and TP53   09/20/2014 Breast MRI   RIGHT: 1 x 0.8 x 0.9 cm lobulated border mass in the retroareolar lower slight lateral area. LEFT: hematoma with surrounding adjacent enhancement encompassing a 4.6 x 4.9 x 4.1 cm area.    09/27/2014 - 01/10/2015 Neo-Adjuvant Chemotherapy   Neoadjuvant TCH Perjeta every 3 week 6 followed by Herceptin maintenance completed 09/21/2015   01/13/2015 Breast MRI   Postsurgical changes in left breast without residual enhancing masses compatible with treatment response   02/10/2015 Definitive Surgery   Bilateral mastectomies: RIGHT: benign.  LEFT: complete path response;  0/3 sentinel nodes   02/10/2015 Pathologic Stage   ypT0 ypN0 ypM0   02/22/2015 -  Anti-estrogen oral therapy   Zoladex with tamoxifen, stopped Zoladex for intolerance, decreased tamoxifen to 10 mg daily.   11/17/2015 Survivorship   Survivorship care plan completed and given to patient    Relapse/Recurrence   Within the upper-outer left breast anterior depth there are 2 adjacent irregular enhancing masses (2.1 cm and 1.5 cm) compatible with biopsy-proven malignancy. Indeterminate enhancing mass within the outer left breast posterior depth (1.8 cm). Indeterminate enhancing mass within the outer lower posterior right breast. 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 ER 80%, PR 0%, Her 2 Positive   08/12/2020 Cancer Staging   Staging form: Breast, AJCC 8th Edition - Pathologic stage from 08/12/2020: Stage IA (pT1b,  pN0, cM0, G2, ER+, PR-, HER2+) - Signed by Gardenia Phlegm, NP on 08/24/2020 Stage prefix: Initial diagnosis Histologic grading system: 3 grade system   09/07/2020 - 05/05/2021 Chemotherapy   Patient is on Treatment Plan : BREAST ADO-Trastuzumab Emtansine (Kadcyla) q21d     11/29/2020 Surgery   Left anterior lumpectomy: Benign, left posterior lumpectomy: Benign, left posterior tissue lateral margin  excision: Benign with 3 intramammary lymph nodes negative for malignancy.  Left posterior tissue superior margin: Benign      FAMILY HISTORY:  We obtained a detailed, 4-generation family history.  Significant diagnoses are listed below: Family History  Problem Relation Age of Onset   Prostate cancer Father 38   Cancer Father        mouth; dx late 6   Prostate cancer Paternal Uncle        dx 2s   Kidney cancer Paternal Grandmother        slow growing, no treatment   Cancer Paternal Grandfather    Prostate cancer Paternal Grandfather 44   Bone cancer Paternal Grandfather 13   Breast cancer Paternal Grandfather 26   Lung cancer Paternal Grandfather        dx late 25s; smoker.  thought to be a 4th primary cancer   Prostate cancer Other        dx 70s; MGM's brother   Lung cancer Other        PGM's sister     Patient's maternal ancestors are of [place], [place], and [place] descent, and paternal ancestors are of [place] descent. There is no reported Ashkenazi Jewish ancestry. There is no known consanguinity.  ATM GENE  Cancer Risks for ATM: Women have a 20-30% lifetime risk of breast cancer. 2-3% risk for epithelial ovarian cancer 5-10% risk for pancreatic cancer  There is emerging evidence suggesting an increased risk for prostate cancer.  Research is continuing to help learn more about the cancers associated with ATM pathogenic variants and what the exact risks are to develop these cancers.   Management Recommendations:  Breast Screening/Risk Reduction: Breast cancer screening includes: Breast awareness beginning at age 19 Monthly self-breast examination beginning at age 58 Clinical breast examination every 6-12 months beginning at age 75 or at the age of the earliest diagnosed breast cancer in the family, if onset was before age 63 Annual mammogram with consideration of tomosynthesis starting at age 67 or 52 years prior to the youngest age of diagnosis, whichever comes  first Consider breast MRI with and without contrast starting at age 39-35 Evidence is insufficient for a prophylactic risk-reducing mastectomy, manage based on family history  For patients who are treated for breast cancer and have not had bilateral mastectomy, screening should continue as described  Ovarian Cancer Screening/Risk Reduction: Evidence insufficient for risk-reducing salpingo oophorectomy; manage based on family history Seek prompt evaluation with onset of signs/symptoms related to ovarian cancer If there is a family history of ovarian cancer, have a discussion with your physician about the benefits and limitations of screening and risk reducing strategies  Pancreatic Cancer Screening/Risk Reduction: Avoid smoking, heavy alcohol use, and obesity. Pancreatic cancer screening may be considered in those with a family history of pancreatic cancer (first- or second-degree relative). Screening includes annual endoscopic ultrasound (preferred) and/or MRI of the pancreas starting at age 44 or 2 years younger than the earliest age diagnosis in the family.   Prostate Cancer Screening: Consider beginning annual PSA blood test and digital rectal exams at age 44.  Additional considerations:  There is insufficient evidence to recommend against radiation therapy.  Individuals with a single pathogenic ATM variant are also carriers of ataxia telangiectasia. Ataxia telangiectasia is associated with childhood cancer risks as well as other medical problems (such as difficulty with movement, balance and coordination problems, neuropathy, and weakened immunity). For there to be a risk of ataxia telangiectasia in offspring, both the patient and their partner would each have to carry a pathogenic variant in ATM; in this case, the risk to have an affected child is 25%.  This information is based on current understanding of the gene and may change in the future.  Implications for Family  Members: Hereditary predisposition to cancer due to pathogenic variants in the ATM gene has autosomal dominant inheritance. This means that an individual with a pathogenic variant has a 50% chance of passing the condition on to his/her offspring. Identification of a pathogenic variant allows for the recognition of at-risk relatives who can pursue testing for the familial variant.   Family members are encouraged to consider genetic testing for this familial pathogenic variant. As there are generally no childhood cancer risks associated with pathogenic variants in the ATM gene, individuals in the family are not recommended to have testing until they reach at least 43 years of age. Family members who live outside of the area are encouraged to find a genetic counselor in their area by visiting: PanelJobs.es.  Resources: FORCE (Facing Our Risk of Cancer Empowered) is a resource for those with a hereditary predisposition to develop cancer.  FORCE provides information about risk reduction, advocacy, legislation, and clinical trials.  Additionally, FORCE provides a platform for collaboration and support; which includes: peer navigation, message boards, local support groups, a toll-free helpline, research registry and recruitment, advocate training, published medical research, webinars, brochures, mastectomy photos, and more.  For more information, visit www.facingourrisk.org  Plan: We discussed that, per NCCN guidelines, pancreatic cancer screening is indicated for those with an ATM mutation in the presence of family history of pancreatic cancer.  She does not have a known family history of pancreatic cancer at this time; however, she wishes to have a discussion with Dr. Cathleen Corti at [facility] about the benefits and limitations of pancreatic cancer screening.  Referral was placed by Dr. Geralyn Flash office.  Patient has had bilateral mastectomies and bilateral oophorectomy.  She knows  that a residual risk of these cancers remains and to report any concerning signs/symptoms.  We encouraged family testing for her paternal relatives.  We recommended genetic testing for her daughter in her early 25s.    We encouraged Ms. Boran to remain in contact with [place] on an regular basis so we can update her personal and family histories, and let her know of advances in cancer genetics that may benefit the family. Our contact number was provided. Ms. Kreiser questions were answered to her satisfaction today, and she knows she is welcome to call anytime with additional questions.   Jaramie Bastos M. Joette Catching, [place], [facility] Genetic Counselor [email or web address] (P) [phone]

## 2022-07-26 ENCOUNTER — Encounter: Payer: Self-pay | Admitting: Hematology and Oncology

## 2022-08-03 IMAGING — MR MR BREAST BILAT WO/W CM
4 of 6 series · 30 of 48 positions shown · IV contrast (8 ml gadavist)
Comparison: Prior exams including previous breast MRIs from
08/17/2020 and 09/15/2020.

CLINICAL DATA: Evaluate response to chemotherapy.
TECHNIQUE: Multiplanar, multisequence MR images of both breasts were obtained
prior to and following the intravenous administration of 8 ml of
Gadavist

[Series 2: t2_tirm_tra ipat (a-p) · axial · 3.0mm · 0.70mm/px · z∈[-77,+85]mm · 3 of 54 slices shown]
[im 1/54]
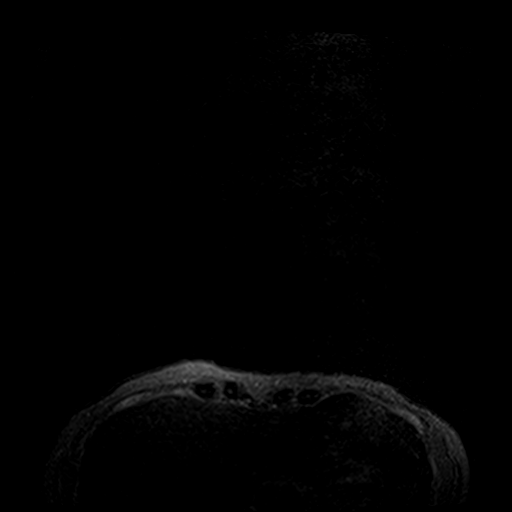
[im 27/54]
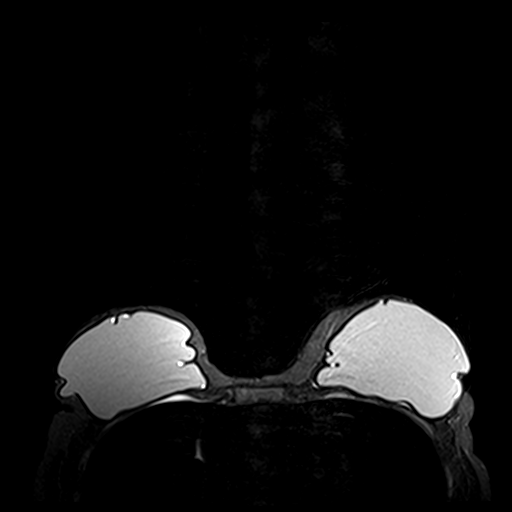
[im 54/54]
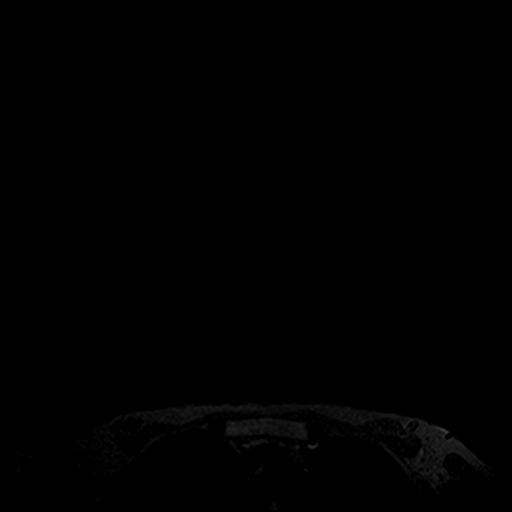

[Series 3: fl3d pre-cm no · axial · non-contrast · 1.2mm · 0.89mm/px · z∈[-82,+89]mm · 11 of 144 slices shown]
[im 1/144]
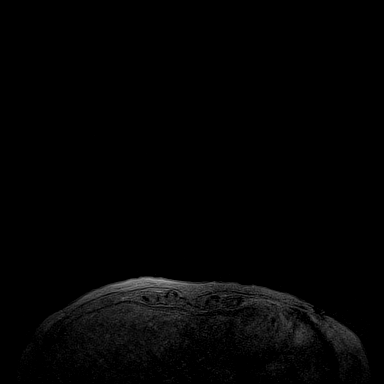
[im 15/144]
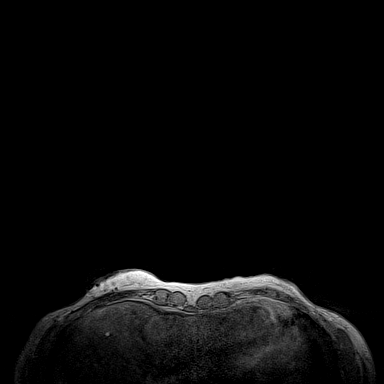
[im 29/144]
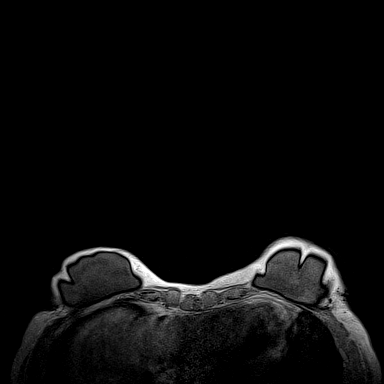
[im 43/144]
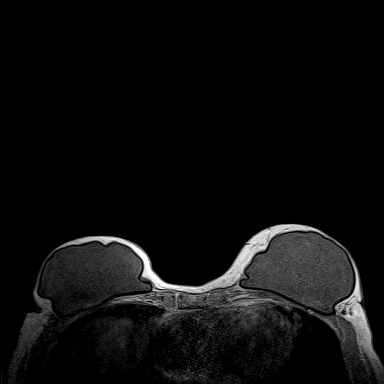
[im 58/144]
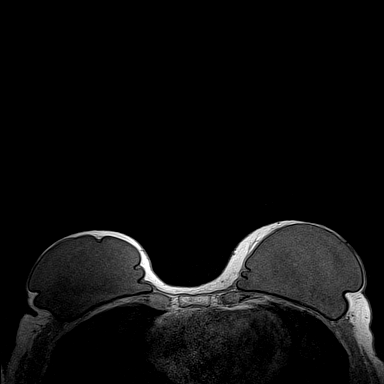
[im 72/144]
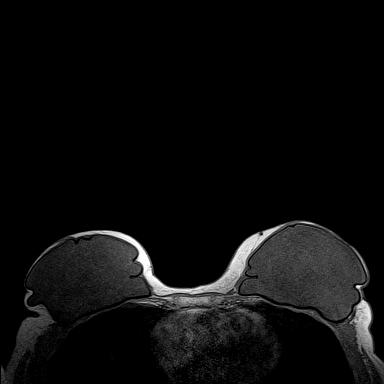
[im 86/144]
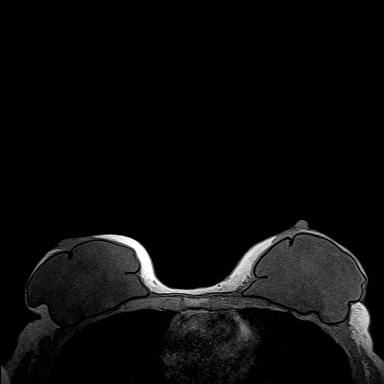
[im 101/144]
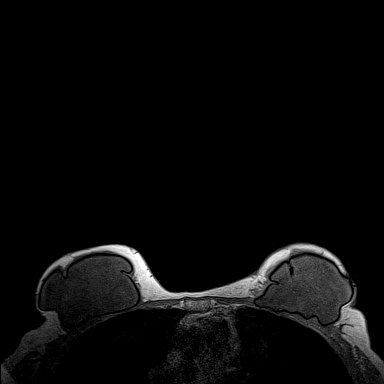
[im 115/144]
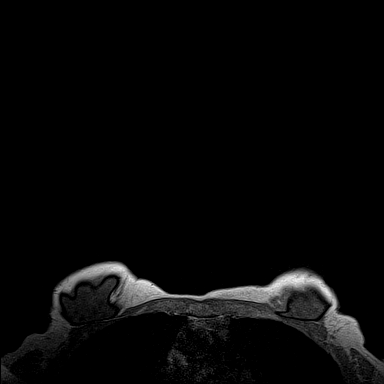
[im 129/144]
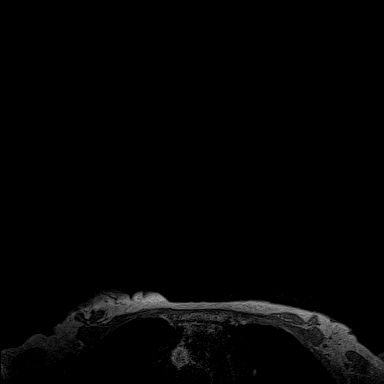
[im 144/144]
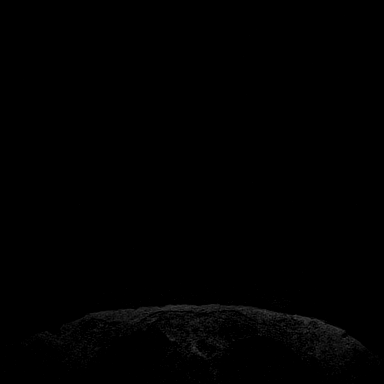

[Series 4: fl3d pre-cm · axial · non-contrast · 1.2mm · 0.89mm/px · z∈[-82,+89]mm · 11 of 144 slices shown]
[im 1/144]
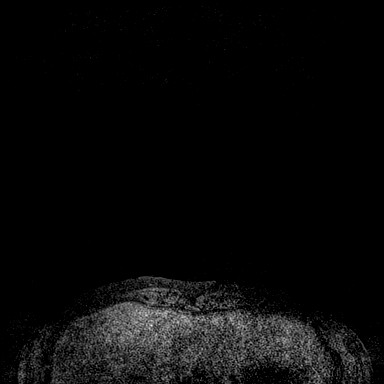
[im 15/144]
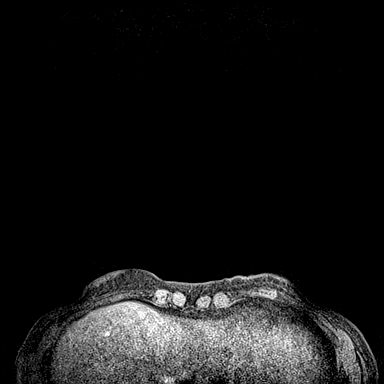
[im 29/144]
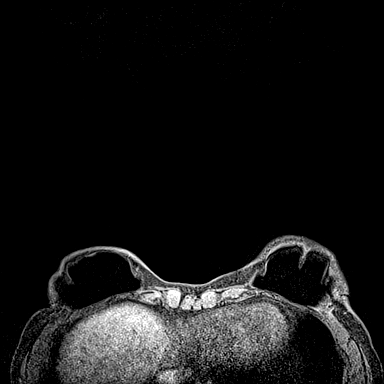
[im 43/144]
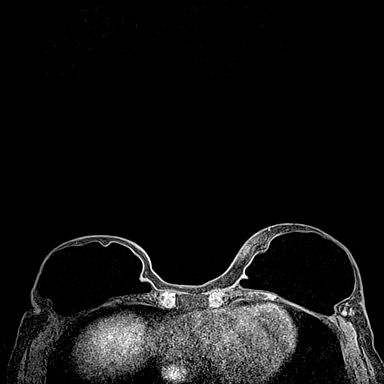
[im 58/144]
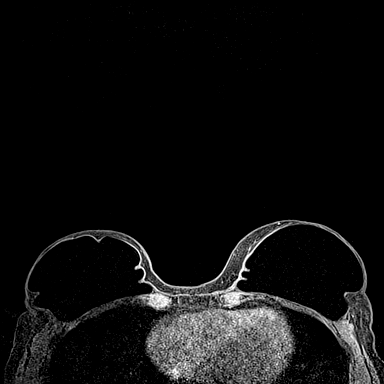
[im 72/144]
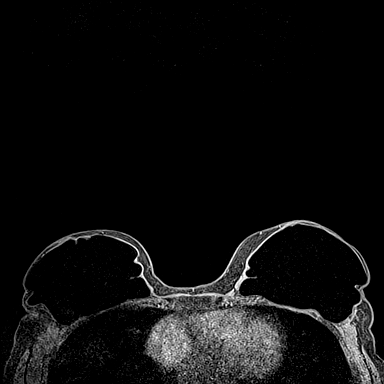
[im 86/144]
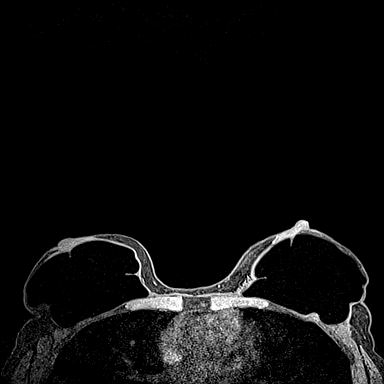
[im 101/144]
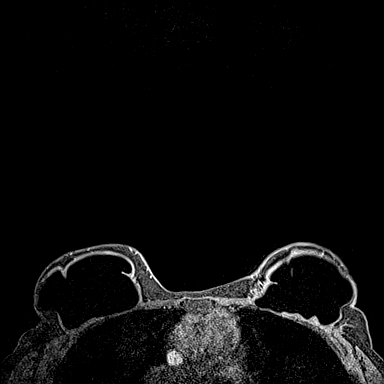
[im 115/144]
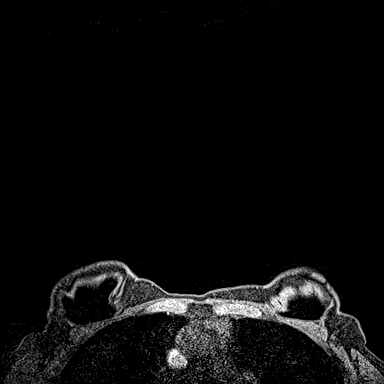
[im 129/144]
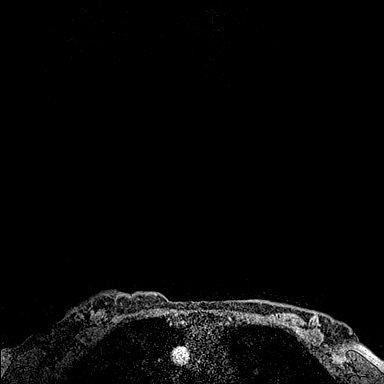
[im 144/144]
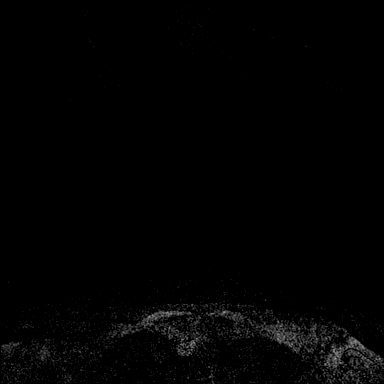

[Series 5: fl3d post-cm 20 · axial · 1.2mm · 0.89mm/px · z∈[-82,+71]mm · 5 of 144 slices shown]
[im 1/144]
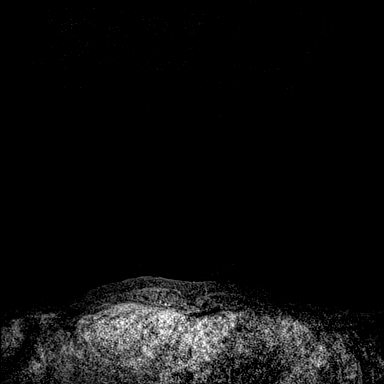
[im 15/144]
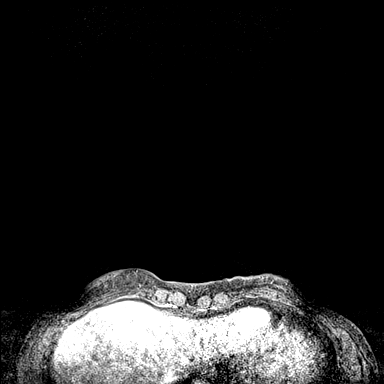
[im 29/144]
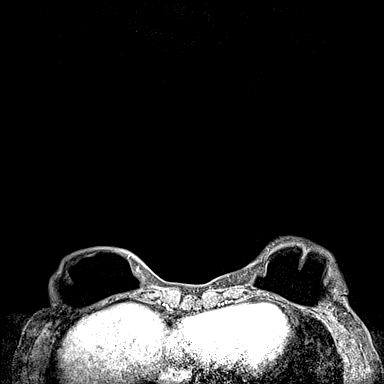
[im 72/144]
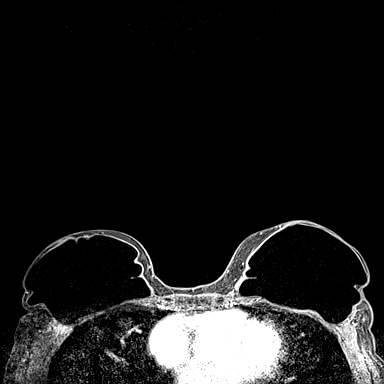
[im 129/144]
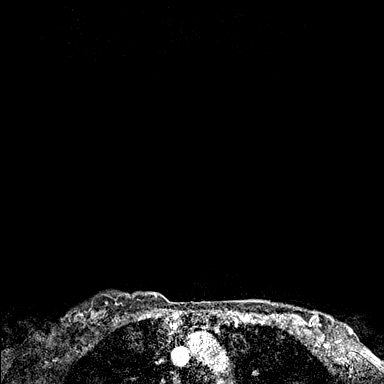

[30 of 48 positions shown; findings below may reference images not displayed]

Patient has a history of left breast carcinoma treated with
bilateral mastectomies in 1078, with implant reconstructions.

In July 2020, the patient underwent additional biopsies,
revealing invasive mammary carcinoma in the anterior left breast at
3 o'clock and 3:30 o'clock. Additional biopsies were performed on
08/25/2020, based on findings on an MRI dated 08/17/2020. Invasive
mammary carcinoma with lobular features was detected at 3 o'clock. A
normal lymph node was noted at 4 o'clock and a lesion in the
posterior right breast revealed benign pathology.

Patient underwent re-excision on 11/29/2020. Pathology revealed no
malignancy in the 4 excision specimens from the left breast.

EXAM:
BILATERAL BREAST MRI WITH AND WITHOUT CONTRAST
Three-dimensional MR images were rendered by post-processing of the
original MR data on an independent workstation. The
three-dimensional MR images were interpreted, and findings are
reported in the following complete MRI report for this study. Three
dimensional images were evaluated at the independent interpreting
workstation using the DynaCAD thin client.
FINDINGS: Breast composition: a. Almost entirely fat.

Background parenchymal enhancement: Minimal

Right breast: Oval enhancing mass along the posteroinferior aspect
of the right breast, adjacent to the implant, associated increased
T2 signal measuring 7 mm, without change from the previous 2 MRIs,
consistent with a lymph node. No areas of abnormal right breast
enhancement to suggest malignancy.

Left breast: 7 mm enhancing mass along the posterior, lower outer
left breast, similar to that seen on the right, associated with
increased T2 signal, also unchanged from the prior MRIs, consistent
with a lymph node. No areas of abnormal left breast enhancement to
suggest malignancy.

Lymph nodes: No abnormal appearing lymph nodes.

Ancillary findings:  Bilateral implants appear intact.
IMPRESSION: 1. No evidence of new or recurrent breast malignancy.
2. Small oval benign enhancing masses in the posterior, lower outer
right and left breasts, stable consistent with normal lymph nodes.

RECOMMENDATION:
Consider annual surveillance breast MRI given the patient's history
breast cancer recurrence.

BI-RADS CATEGORY  2: Benign.

## 2022-08-20 ENCOUNTER — Other Ambulatory Visit: Payer: Self-pay | Admitting: *Deleted

## 2022-08-28 ENCOUNTER — Encounter: Payer: Self-pay | Admitting: Internal Medicine

## 2022-08-29 ENCOUNTER — Encounter: Payer: Self-pay | Admitting: Hematology and Oncology

## 2022-08-30 ENCOUNTER — Other Ambulatory Visit: Payer: Self-pay | Admitting: Hematology and Oncology

## 2022-08-30 MED ORDER — LORAZEPAM 0.5 MG PO TABS: 0.5000 mg | ORAL_TABLET | Freq: Four times a day (QID) | ORAL | 3 refills | Status: DC | PRN

## 2022-10-01 ENCOUNTER — Telehealth: Payer: Self-pay | Admitting: Hematology and Oncology

## 2022-10-01 ENCOUNTER — Encounter: Payer: Self-pay | Admitting: Hematology and Oncology

## 2022-10-03 ENCOUNTER — Other Ambulatory Visit: Payer: Self-pay | Admitting: Hematology and Oncology

## 2022-10-03 ENCOUNTER — Telehealth: Payer: Self-pay | Admitting: Hematology and Oncology

## 2022-10-03 ENCOUNTER — Inpatient Hospital Stay: Payer: BC Managed Care – PPO | Admitting: Hematology and Oncology

## 2022-10-03 DIAGNOSIS — Z170 Estrogen receptor positive status [ER+]: Secondary | ICD-10-CM

## 2022-10-03 NOTE — Telephone Encounter (Signed)
Reason for requesting breast MRI: Although patient had bilateral mastectomies, she has had recurrence postmastectomy and were not palpable readily.  She had breast reconstruction which makes it difficult to use any other modality for monitoring for breast cancer.  It is for this reason that I am requesting breast MRIs.  In addition the recurrences have all been lobular histology which can be difficult to detect using regular surveillance measures.

## 2022-10-05 ENCOUNTER — Other Ambulatory Visit: Payer: BC Managed Care – PPO

## 2022-10-20 NOTE — Progress Notes (Signed)
HEMATOLOGY-ONCOLOGY TELEPHONE VISIT PROGRESS NOTE  I connected with our patient on 10/24/22 at  8:15 AM EDT by telephone and verified that I am speaking with the correct person using two identifiers.  I discussed the limitations, risks, security and privacy concerns of performing an evaluation and management service by telephone and the availability of in person appointments.  I also discussed with the patient that there may be a patient responsible charge related to this service. The patient expressed understanding and agreed to proceed.   History of Present Illness: Natasha Torres is a 42 y.o. female with recurrent left breast cancer currently on chemotherapy with Kadcyla. She presents via telephone today for  follow up.  Oncology History  Breast cancer of upper-outer quadrant of left female breast ([facility])  08/13/2014 Mammogram   Left breast: 2 cm circumscribed mass   08/13/2014 Breast US   Left breast: two masses: #1: 1:00 10 x 9 x 13 mmm irregular; #2: 2:00: 16 x 7 x 14 mm; no LAD   08/27/2014 Initial Diagnosis   O/S excisional biopsy: 2 masses showing 1.5 cm and 0.9 cm IDC, Grade 3, ER + (90%), PR- (0%), HER-2 positive (ratio 2.5), Ki67 ~30%, Multifocal   08/27/2014 Clinical Stage   Stage IIA/IIB: T2/T3 N0   09/17/2014 Procedure   MyRisk panel (Myriad) 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 Otherwise negative at APC, ATM, BARD1, BMPR1A, BRCA1, BRCA2, BRIP1, CHD1, CDK4, CDKN2A, CHEK2, MLH1, MSH2, MSH6, MUTYH, NBN, PALB2, PMS2, PTEN, RAD51C, RAD51D, SMAD4, STK11, and TP53   09/20/2014 Breast MRI   RIGHT: 1 x 0.8 x 0.9 cm lobulated border mass in the retroareolar lower slight lateral area. LEFT: hematoma with surrounding adjacent enhancement encompassing a 4.6 x 4.9 x 4.1 cm area.    09/27/2014 - 01/10/2015 Neo-Adjuvant Chemotherapy   Neoadjuvant TCH Perjeta every 3 week 6 followed by Herceptin maintenance completed 09/21/2015   01/13/2015 Breast MRI   Postsurgical changes in left breast  without residual enhancing masses compatible with treatment response   02/10/2015 Definitive Surgery   Bilateral mastectomies: RIGHT: benign.  LEFT: complete path response;  0/3 sentinel nodes   02/10/2015 Pathologic Stage   ypT0 ypN0 ypM0   02/22/2015 -  Anti-estrogen oral therapy   Zoladex with tamoxifen, stopped Zoladex for intolerance, decreased tamoxifen to 10 mg daily.   11/17/2015 Survivorship   Survivorship care plan completed and given to patient    Relapse/Recurrence   Within the upper-outer left breast anterior depth there are 2 adjacent irregular enhancing masses (2.1 cm and 1.5 cm) compatible with biopsy-proven malignancy. Indeterminate enhancing mass within the outer left breast posterior depth (1.8 cm). Indeterminate enhancing mass within the outer lower posterior right breast. 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 ER 80%, PR 0%, Her 2 Positive   08/12/2020 Cancer Staging   Staging form: Breast, AJCC 8th Edition - Pathologic stage from 08/12/2020: Stage IA (pT1b, pN0, cM0, G2, ER+, PR-, HER2+) - Signed by Loa Socks, NP on 08/24/2020 Stage prefix: Initial diagnosis Histologic grading system: 3 grade system   09/07/2020 - 05/05/2021 Chemotherapy   Patient is on Treatment Plan : BREAST ADO-Trastuzumab Emtansine (Kadcyla) q21d     11/29/2020 Surgery   Left anterior lumpectomy: Benign, left posterior lumpectomy: Benign, left posterior tissue lateral margin excision: Benign with 3 intramammary lymph nodes negative for malignancy.  Left posterior tissue superior margin: Benign     REVIEW OF SYSTEMS:   Constitutional: Denies fevers,  chills or abnormal weight loss All other systems were reviewed with the patient and are negative. Observations/Objective:     Assessment Plan:  Breast cancer of upper-outer quadrant of left female breast 08/13/2014: Left breast IDC. Neo adj chemo with TCHP foll by Herceptin maintenance  (path CR: Bil mastectomies and reconstruction), Tamoxifen (stoped and started due to intolerance) ATM gene mutation   08/12/20 Relapsed Breast Cancer: Left Breast UOQ 2.1 cm and 1.5 cm. Indeterminate 1.8 cm and Right breast indeterminate 1.5 cm.  Biopsy revealed IDC with DCIS, ER 80%, PR 0%, HER-2 3+ IHC positive   CT scans: scheduled for 08/24/20 Left breast Biopsy 3 o clock:Grade 2-3 IMC with lobular features ER 60%, PR 10%, Ki-67 20%, HER-2 3+ Rt Breast Biopsy: Benign   Treatment plan: 1.  chemotherapy with Kadcyla X 4 cycles completed 11/10/2020 2. Surgery 11/29/2020: Pathologic complete response 3. Followed by XRT (have to discuss the pros and cons of radiation given ATM mutation) 4. Further adjuvant antiestrogen therapy (ovarian suppression or oophorectomy plus Letrozole) Zoladex monthly to her treatment plan since 09/07/20 ------------------------------------------------------------------ Antiestrogen therapy with Zoladex with Letrozole (now Letrozole alone since oophorectomy 05/29/21) Echo on 6/10: EF 61%  Abnormal heart strain: ECHO: EF 55% 06/14/22 Anxiety: Seeing a counselor since July [facility])   Healthy lifestyle: Patient has made tremendous changes in her lifestyle and is extremely careful with her diet and exercise was. 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 The patient was provided an opportunity to ask questions and all were answered. The patient agreed with the plan and demonstrated an understanding of the instructions. The patient was advised to call back or seek an in-person evaluation if the symptoms worsen or if the condition fails to improve as anticipated.    I provided 12 minutes of non-face-to-face time during this encounter.  This includes time for charting and coordination of care   Tamsen Meek, MD  I Janan Ridge am acting as a scribe for Dr.Vinay Gudena  I have reviewed the above documentation for accuracy and completeness, and I agree with the above.

## 2022-10-22 ENCOUNTER — Ambulatory Visit
Admission: RE | Admit: 2022-10-22 | Discharge: 2022-10-22 | Disposition: A | Discharge status: Home / Self Care | Payer: BC Managed Care – PPO | Source: Ambulatory Visit | Attending: Hematology and Oncology | Admitting: Hematology and Oncology

## 2022-10-22 DIAGNOSIS — Z170 Estrogen receptor positive status [ER+]: Secondary | ICD-10-CM

## 2022-10-22 MED ORDER — GADOPICLENOL 0.5 MMOL/ML IV SOLN
7.5000 mL | Freq: Once | INTRAVENOUS | Status: AC | PRN
  Administered 2022-10-22: 7.5 mL via INTRAVENOUS

## 2022-10-24 ENCOUNTER — Inpatient Hospital Stay: Payer: BC Managed Care – PPO | Attending: Hematology and Oncology | Admitting: Hematology and Oncology

## 2022-10-24 DIAGNOSIS — Z170 Estrogen receptor positive status [ER+]: Secondary | ICD-10-CM | POA: Diagnosis not present

## 2022-10-24 DIAGNOSIS — C50412 Malignant neoplasm of upper-outer quadrant of left female breast: Secondary | ICD-10-CM | POA: Diagnosis not present

## 2022-10-24 DIAGNOSIS — Z780 Asymptomatic menopausal state: Secondary | ICD-10-CM

## 2022-10-24 MED ORDER — LORAZEPAM 0.5 MG PO TABS: 0.5000 mg | ORAL_TABLET | Freq: Four times a day (QID) | ORAL | 3 refills | Status: DC | PRN

## 2022-10-24 NOTE — Assessment & Plan Note (Signed)
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 Neo adj chemo with TCHP foll by Herceptin maintenance (path CR: Bil mastectomies and reconstruction), Tamoxifen (stoped and started due to intolerance) ATM gene mutation   08/12/20 Relapsed Breast Cancer: Left Breast UOQ 2.1 cm and 1.5 cm. Indeterminate 1.8 cm and Right breast indeterminate 1.5 cm.  Biopsy revealed IDC with DCIS, ER 80%, PR 0%, HER-2 3+ IHC positive   CT scans: scheduled for 08/24/20 Left breast Biopsy 3 o clock:Grade 2-3 IMC with lobular features ER 60%, PR 10%, Ki-67 20%, HER-2 3+ Rt Breast Biopsy: Benign   Treatment plan: 1.  chemotherapy with Kadcyla X 4 cycles completed 11/10/2020 2. Surgery 11/29/2020: Pathologic complete response 3. Followed by XRT (have to discuss the pros and cons of radiation given ATM mutation) 4. Further adjuvant antiestrogen therapy (ovarian suppression or oophorectomy plus Letrozole) Zoladex monthly to her treatment plan since 09/07/20 ------------------------------------------------------------------ Antiestrogen therapy with Zoladex with Letrozole (now Letrozole alone since oophorectomy 05/29/21) Echo on 6/10: EF 61%   Severe fatigue: improved Sleep:from stress Abnormal heart strain: ECHO: EF 55% 06/14/22 Anxiety: Seeing a counselor since July [facility])   Healthy lifestyle: Patient has made tremendous changes in her lifestyle and is extremely careful with her diet and exercise was.  She quit alcohol and meat and is on a vegetarian diet.   05/29/21:Salpingo oophorectomy: mild Hot flashes   Current Treatment: Letrozole Letrozole toxicities: Mild hot flashes Hair thinning  Breast MRI 10/22/2022: Benign Repeat breast MRI in 1 year and follow-up with a telephone visit

## 2022-10-25 ENCOUNTER — Telehealth: Payer: Self-pay | Admitting: Hematology and Oncology

## 2022-10-25 NOTE — Telephone Encounter (Signed)
Scheduled appointment per 5/8 los. Patient is aware of the made appointment.

## 2022-11-06 ENCOUNTER — Other Ambulatory Visit: Payer: Self-pay | Admitting: Plastic Surgery

## 2022-11-06 DIAGNOSIS — N63 Unspecified lump in unspecified breast: Secondary | ICD-10-CM

## 2022-11-09 ENCOUNTER — Ambulatory Visit (HOSPITAL_BASED_OUTPATIENT_CLINIC_OR_DEPARTMENT_OTHER)
Admission: RE | Admit: 2022-11-09 | Discharge: 2022-11-09 | Disposition: A | Discharge status: Home / Self Care | Payer: BC Managed Care – PPO | Source: Ambulatory Visit | Attending: Hematology and Oncology | Admitting: Hematology and Oncology

## 2022-11-09 DIAGNOSIS — Z780 Asymptomatic menopausal state: Secondary | ICD-10-CM | POA: Insufficient documentation

## 2022-11-13 ENCOUNTER — Encounter: Payer: Self-pay | Admitting: Hematology and Oncology

## 2022-11-13 ENCOUNTER — Telehealth: Payer: Self-pay

## 2022-11-13 NOTE — Telephone Encounter (Signed)
Pt called asking to discuss bone density report. Report shows score of -1.9, osteopenia. Advised Pt to start taking 1200mg  of Calcium in addition to her VitD supplement. Pt states she has a gym membership, encouraged Pt to continue with weight-bearing exercises. Pt asking to be seen by provider to discuss continuing on letrozole. Appt made with NP at end of June per Pt request as she starts a new job at beginning of June. Pt verbalized understanding.

## 2022-12-14 ENCOUNTER — Inpatient Hospital Stay: Payer: BC Managed Care – PPO | Attending: Hematology and Oncology | Admitting: Adult Health

## 2022-12-14 ENCOUNTER — Encounter: Payer: Self-pay | Admitting: Adult Health

## 2022-12-14 DIAGNOSIS — C50412 Malignant neoplasm of upper-outer quadrant of left female breast: Secondary | ICD-10-CM | POA: Diagnosis not present

## 2022-12-14 DIAGNOSIS — Z170 Estrogen receptor positive status [ER+]: Secondary | ICD-10-CM | POA: Diagnosis not present

## 2022-12-14 NOTE — Progress Notes (Signed)
[facility] Follow up:    Natasha Sanes, MD [address] [place] [address]   DIAGNOSIS:  Cancer Staging  Breast cancer of upper-outer quadrant of left female breast [facility]) Staging form: Breast, AJCC 7th Edition - Clinical stage from 08/27/2014: Stage IIA (T2, N0, M0) - Unsigned Laterality: Left - Pathologic: T0, N0, cM0 - Unsigned Laterality: Left Staging form: Breast, AJCC 8th Edition - Pathologic stage from 08/12/2020: Stage IA (pT1b, pN0, cM0, G2, ER+, PR-, HER2+) - Signed by Loa Socks, NP on 08/24/2020 Stage prefix: Initial diagnosis Histologic grading system: 3 grade system  I connected with Natasha Torres on 12/14/22 at  1:45 PM EDT by telephone and verified that I am speaking with the correct person using two identifiers.  I discussed the limitations, risks, security and privacy concerns of performing an evaluation and management service by telephone and the availability of in person appointments.  I also discussed with the patient that there may be a patient responsible charge related to this service. The patient expressed understanding and agreed to proceed.   SUMMARY OF ONCOLOGIC HISTORY: Oncology History  Breast cancer of upper-outer quadrant of left female breast (HCC)  08/13/2014 Mammogram   Left breast: 2 cm circumscribed mass   08/13/2014 Breast US   Left breast: two masses: #1: 1:00 10 x 9 x 13 mmm irregular; #2: 2:00: 16 x 7 x 14 mm; no LAD   08/27/2014 Initial Diagnosis   O/S excisional biopsy: 2 masses showing 1.5 cm and 0.9 cm IDC, Grade 3, ER + (90%), PR- (0%), HER-2 positive (ratio 2.5), Ki67 ~30%, Multifocal   08/27/2014 Clinical Stage   Stage IIA/IIB: T2/T3 N0   09/17/2014 Procedure   MyRisk panel ([employer]) 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 Otherwise negative at APC, ATM, BARD1, BMPR1A, BRCA1, BRCA2, BRIP1, CHD1, CDK4, CDKN2A, CHEK2, MLH1, MSH2, MSH6, MUTYH, NBN, PALB2, PMS2, PTEN, RAD51C, RAD51D, SMAD4,  STK11, and TP53   09/20/2014 Breast MRI   RIGHT: 1 x 0.8 x 0.9 cm lobulated border mass in the retroareolar lower slight lateral area. LEFT: hematoma with surrounding adjacent enhancement encompassing a 4.6 x 4.9 x 4.1 cm area.    09/27/2014 - 01/10/2015 Neo-Adjuvant Chemotherapy   Neoadjuvant TCH Perjeta every 3 week 6 followed by Herceptin maintenance completed 09/21/2015   01/13/2015 Breast MRI   Postsurgical changes in left breast without residual enhancing masses compatible with treatment response   02/10/2015 Definitive Surgery   Bilateral mastectomies: RIGHT: benign.  LEFT: complete path response;  0/3 sentinel nodes   02/10/2015 Pathologic Stage   ypT0 ypN0 ypM0   02/22/2015 -  Anti-estrogen oral therapy   Zoladex with tamoxifen, stopped Zoladex for intolerance, decreased tamoxifen to 10 mg daily.   11/17/2015 Survivorship   Survivorship care plan completed and given to patient    Relapse/Recurrence   Within the upper-outer left breast anterior depth there are 2 adjacent irregular enhancing masses (2.1 cm and 1.5 cm) compatible with biopsy-proven malignancy. Indeterminate enhancing mass within the outer left breast posterior depth (1.8 cm). Indeterminate enhancing mass within the outer lower posterior right breast. 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 ER 80%, PR 0%, Her 2 Positive   08/12/2020 Cancer Staging   Staging form: Breast, AJCC 8th Edition - Pathologic stage from 08/12/2020: Stage IA (pT1b, pN0, cM0, G2, ER+, PR-, HER2+) - Signed by Loa Socks, NP on 08/24/2020 Stage prefix: Initial diagnosis Histologic  grading system: 3 grade system   09/07/2020 - 05/05/2021 Chemotherapy   Patient is on Treatment Plan : BREAST ADO-Trastuzumab Emtansine (Kadcyla) q21d     11/29/2020 Surgery   Left anterior lumpectomy: Benign, left posterior lumpectomy: Benign, left posterior tissue lateral margin excision: Benign with 3  intramammary lymph nodes negative for malignancy.  Left posterior tissue superior margin: Benign     CURRENT THERAPY: Letrozole daily.  INTERVAL HISTORY: Natasha Torres 42 y.o. female returns for follow-up of her bone density results that demonstrate osteopenia on most recent bone density testing completed on Nov 09, 2022.  Her lowest T-score was -1.9 and her left femur and L1-L4 spine.  She also underwent bilateral breast MRI on Oct 22, 2022 that demonstrated no suspicious enhancement in either breast. Over the past month she developed a small lump in her right breast.  She emailed Dr. Leta Baptist about this and is going for an ultrasound 12/19/2022.     Patient Active Problem List   Diagnosis Date Noted   Monoallelic mutation of ATM gene 16/03/9603   Anxiety and depression 08/28/2018   Attention deficit disorder 07/25/2016   Iron deficiency anemia 06/05/2016   Vitamin D deficiency 06/05/2016   Chronic fatigue syndrome 05/21/2016   Lymphedema 04/04/2015   S/P bilateral mastectomy 02/10/2015   Breast cancer associated with mutation in ATM gene (HCC)    Breast cancer of upper-outer quadrant of left female breast (HCC) 09/14/2014    is allergic to tramadol, buprenorphine hcl, morphine and codeine, other, penicillins, and tegaderm ag mesh [silver].  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 of post-op N/V   History of chemotherapy 2016   for left breast cancer - carboplatin, taxotere, perjeta, herceptin   History of chemotherapy 2022   Began Kadcyla every 21 days x 4 cycles, 09/07/20 - 11/10/20. After surgery on 11/29/20 pt began maintenance Kadcyla for 1 year.    Recurrent breast cancer, left (HCC) dx'd 07/2020    SURGICAL HISTORY: Past Surgical History:  Procedure Laterality Date   BREAST BIOPSY  08/12/2020   BREAST LUMPECTOMY Left 08/17/2014   BREAST RECONSTRUCTION WITH PLACEMENT OF TISSUE EXPANDER AND FLEX HD (ACELLULAR HYDRATED DERMIS) Bilateral 02/10/2015   Procedure: BREAST RECONSTRUCTION WITH PLACEMENT OF TISSUE EXPANDER AND FLEX HD (ACELLULAR HYDRATED DERMIS);  Surgeon: Glenna Fellows, MD;  Location: [facility] OR;  Service: Plastics;  Laterality: Bilateral;   INCISION AND DRAINAGE OF WOUND Left 03/02/2015   Procedure: IRRIGATION BREAST POCKET AND EXCHANGE OF LEFT BREAST TISSUE EXPANDER;  Surgeon: Glenna Fellows, MD;  Location: [facility] OR;  Service: Plastics;  Laterality: Left;   LAPAROSCOPIC SALPINGO OOPHERECTOMY Left 05/29/2021   Procedure: LAPAROSCOPIC LEFT SALPINGO OOPHORECTOMY; PELVIC WASHING;  Surgeon: Ranae Pila, MD;  Location: [facility];  Service: Gynecology;  Laterality: Left;  Procedure #2   LIPOSUCTION WITH LIPOFILLING Bilateral 06/07/2015   Procedure: LIPOSUCTION WITH LIPOFILLING TO BILATERAL CHEST;  Surgeon: Glenna Fellows, MD;  Location: [facility];  Service: Plastics;  Laterality: Bilateral;   LIPOSUCTION WITH LIPOFILLING Bilateral 09/23/2015   Procedure: LIPOFILLING FROM BILATERAL THIGHS TO BILATERAL CHEST ;  Surgeon: Irean Hong  Leta Baptist, MD;  Location: [facility];  Service: Plastics;  Laterality: Bilateral;   LIPOSUCTION WITH LIPOFILLING Bilateral 06/15/2016   Procedure: LIPOFILLING FROM BILATERAL THIGH TO BILATERAL CHEST;  Surgeon: Glenna Fellows, MD;  Location: [facility];  Service: Plastics;  Laterality: Bilateral;  LIPOFILLING FROM BILATERAL THIGH TO BILATERAL CHEST   MASTECTOMY Bilateral 02/10/2015   NIPPLE SPARING MASTECTOMY/SENTINAL LYMPH NODE BIOPSY/RECONSTRUCTION/PLACEMENT OF TISSUE EXPANDER Bilateral 02/10/2015   Procedure: BILATERAL  NIPPLE SPARING  MASTECTOMY WITH LEFT  SENTINAL LYMPH NODE BIOPSY(RIGHT BREAST PROPHYLACTIC);  Surgeon: Emelia Loron, MD;  Location: [facility] OR;  Service: General;  Laterality: Bilateral;   ORIF FINGER / THUMB FRACTURE Right 06/18/1992   thumb   PORT-A-CATH REMOVAL  01/07/2015   removed and replaced   PORT-A-CATH REMOVAL Right 09/23/2015   Procedure: REMOVAL PORT-A-CATH;  Surgeon: Glenna Fellows, MD;  Location: [facility];  Service: Plastics;  Laterality: Right;   PORT-A-CATH REMOVAL Right 05/29/2021   Procedure: REMOVAL PORT-A-CATH;  Surgeon: Emelia Loron, MD;  Location: [facility];  Service: General;  Laterality: Right;  procedure #1   PORTACATH PLACEMENT  08/2014; 01/07/2015   PORTACATH PLACEMENT Right 09/06/2020   Procedure: INSERTION PORT-A-CATH;  Surgeon: Emelia Loron, MD;  Location: [facility];  Service: General;  Laterality: Right;   RADIOACTIVE SEED GUIDED EXCISIONAL BREAST BIOPSY Left 11/29/2020   Procedure: RADIOACTIVE SEED GUIDED EXCISION OF LEFT BREAST CANCER x2;  Surgeon: Emelia Loron, MD;  Location: [facility];  Service: General;  Laterality: Left;   REMOVAL OF BILATERAL TISSUE EXPANDERS WITH PLACEMENT OF BILATERAL BREAST IMPLANTS Bilateral 06/07/2015   Procedure: REMOVAL OF BILATERAL TISSUE EXPANDERS WITH PLACEMENT OF BILATERAL BREAST  SILICONE IMPLANTS;  Surgeon: Glenna Fellows, MD;  Location:  SURGERY CENTER;  Service: Plastics;  Laterality: Bilateral;   REMOVAL OF TISSUE EXPANDER AND PLACEMENT OF IMPLANT Right 02/27/2015   Procedure: REMOVAL OF TISSUE EXPANDER AND PLACEMENT OF NEW TISSUE EXPANDER;  Surgeon: Glenna Fellows, MD;  Location: [facility] OR;  Service: Plastics;  Laterality: Right;   SALPINGOOPHORECTOMY Right 11/22/2010   due to dermoid cyst   TISSUE EXPANDER PLACEMENT Left 03/02/2015   Procedure: TISSUE EXPANDER;  Surgeon: Glenna Fellows, MD;  Location: [facility] OR;  Service: Plastics;  Laterality: Left;   WISDOM TOOTH  EXTRACTION  06/19/1999    SOCIAL HISTORY: Social History   Socioeconomic History   Marital status: Divorced    Spouse name: Cristal Deer   Number of children: 1   Years of education: Not on file   Highest education level: Not on file  Occupational History   Not on file  Tobacco Use   Smoking status: Never   Smokeless tobacco: Never  Vaping Use   Vaping Use: Never used  Substance and Sexual Activity   Alcohol use: Not Currently   Drug use: Yes    Types: Marijuana    Comment: Patient hasn't used in 2 years as of 05/24/21 per pt.   Sexual activity: Not Currently  Other Topics Concern   Not on file  Social History Narrative   ** Merged History Encounter **       Social Determinants of Health   Financial Resource Strain: Not on file  Food Insecurity: Not on file  Transportation Needs: Not on file  Physical Activity: Not on file  Stress: Not on file  Social Connections: Not on file  Intimate Partner Violence: Not on file    FAMILY HISTORY: Family History  Problem Relation Age of Onset  Hypertension Mother    Anesthesia problems Mother        post-op N/V   Prostate cancer Father 13   Cancer Father        mouth; dx late 33   Depression Maternal Aunt    Prostate cancer Paternal Uncle        dx 47s   Dementia Maternal Grandmother    Heart attack Maternal Grandfather    Dementia Paternal Grandmother    Kidney cancer Paternal Grandmother        slow growing, no treatment   Cancer Paternal Grandfather    Prostate cancer Paternal Grandfather 40   Bone cancer Paternal Grandfather 22   Breast cancer Paternal Grandfather 54   Lung cancer Paternal Grandfather        dx late 74s; smoker.  thought to be a 4th primary cancer   Prostate cancer Other        dx 34s; MGM's brother   Lung cancer Other        PGM's sister    Review of Systems  Constitutional:  Negative for appetite change, chills, fatigue, fever and unexpected weight change.  HENT:   Negative for hearing  loss, lump/mass and trouble swallowing.   Eyes:  Negative for eye problems and icterus.  Respiratory:  Negative for chest tightness, cough and shortness of breath.   Cardiovascular:  Negative for chest pain, leg swelling and palpitations.  Gastrointestinal:  Negative for abdominal distention, abdominal pain, constipation, diarrhea, nausea and vomiting.  Endocrine: Negative for hot flashes.  Genitourinary:  Negative for difficulty urinating.   Musculoskeletal:  Negative for arthralgias.  Skin:  Negative for itching and rash.  Neurological:  Negative for dizziness, extremity weakness, headaches and numbness.  Hematological:  Negative for adenopathy. Does not bruise/bleed easily.  Psychiatric/Behavioral:  Negative for depression. The patient is not nervous/anxious.       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  Natasha Torres's most recent bone density demonstrated osteopenia with a T-score -1.9 in the left femur and the L1-L4 spine.  I reviewed with her bone health including calcium, vitamin D, and weightbearing exercises.  We also discussed the potential of starting Zometa as this can improve her bone density and also reduce the risk of recurrence.  We reviewed the risks and benefits of both.  I told Natasha Torres that I believe the risks of stopping the letrozole outweigh the benefits of staying on the letrozole and optimizing bone health.  Natasha Torres has opted to continue letrozole, and will optimize calcium, vitamin D, and weightbearing exercises.  We will repeat bone density testing in 10/2024.  She will proceed with  right breast ultrasound and guidance on her right breast nodule from Dr. Leta Baptist.   She verbalized understanding of this plan and will return in May 2025 for follow-up with Dr. Pamelia Hoit as scheduled.  Follow up instructions:    -Return to cancer center 10/2023 for f/u with Dr. Pamelia Hoit    The patient was provided an opportunity to ask questions and all were answered. The patient agreed with the plan and demonstrated an understanding of the instructions.   The patient was advised to call back  or seek an in-person evaluation if the symptoms worsen or if the condition fails to improve as anticipated.   I provided 25 minutes of non face-to-face telephone visit time during this encounter, and > 50% was spent counseling as documented under my assessment & plan.  Lillard Anes, NP 12/14/22 2:16 PM Medical Oncology and Hematology [facility] [address] [place], [place] [address] Tel. [phone]    Fax. [phone]  *Total Encounter Time as defined by the Centers for Medicare and Medicaid Services includes, in addition to the face-to-face time of a patient visit (documented in the note above) non-face-to-face time: obtaining and reviewing outside history, ordering and reviewing medications, tests or procedures, care coordination (communications with other health care professionals or caregivers) and documentation in the medical record.

## 2022-12-14 NOTE — Assessment & Plan Note (Signed)
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  Julie's most recent bone density demonstrated osteopenia with a T-score -1.9 in the left femur and the L1-L4 spine.  I reviewed with her bone health including calcium, vitamin D, and weightbearing exercises.  We also discussed the potential of starting Zometa as this can improve her bone density and also reduce the risk of recurrence.  We reviewed the risks and benefits of both.  I told Alona Bene that I believe the risks of stopping the letrozole outweigh the benefits of staying on the letrozole and optimizing bone health.  Beily has opted to continue letrozole, and will optimize calcium, vitamin D, and weightbearing exercises.  We will repeat bone density testing in 10/2024.  She will proceed with right breast ultrasound and guidance on her right breast nodule from Dr. Leta Baptist.   She verbalized understanding of this plan and will return in May 2025 for follow-up with Dr. Pamelia Hoit as scheduled.

## 2022-12-19 ENCOUNTER — Other Ambulatory Visit: Payer: Self-pay | Admitting: Plastic Surgery

## 2022-12-19 ENCOUNTER — Ambulatory Visit
Admission: RE | Admit: 2022-12-19 | Discharge: 2022-12-19 | Disposition: A | Discharge status: Home / Self Care | Payer: BC Managed Care – PPO | Source: Ambulatory Visit | Attending: Plastic Surgery | Admitting: Plastic Surgery

## 2022-12-19 DIAGNOSIS — N63 Unspecified lump in unspecified breast: Secondary | ICD-10-CM

## 2023-02-17 ENCOUNTER — Other Ambulatory Visit: Payer: Self-pay | Admitting: Hematology and Oncology

## 2023-03-22 ENCOUNTER — Encounter: Payer: Self-pay | Admitting: Family Medicine

## 2023-03-22 ENCOUNTER — Ambulatory Visit (INDEPENDENT_AMBULATORY_CARE_PROVIDER_SITE_OTHER): Payer: BC Managed Care – PPO | Admitting: Family Medicine

## 2023-03-22 VITALS — BP 104/66 | HR 60 | Temp 97.8°F | Ht 71.0 in | Wt 154.0 lb

## 2023-03-22 DIAGNOSIS — R748 Abnormal levels of other serum enzymes: Secondary | ICD-10-CM | POA: Diagnosis not present

## 2023-03-22 DIAGNOSIS — Z833 Family history of diabetes mellitus: Secondary | ICD-10-CM | POA: Diagnosis not present

## 2023-03-22 DIAGNOSIS — Z853 Personal history of malignant neoplasm of breast: Secondary | ICD-10-CM

## 2023-03-22 DIAGNOSIS — Z83438 Family history of other disorder of lipoprotein metabolism and other lipidemia: Secondary | ICD-10-CM | POA: Diagnosis not present

## 2023-03-22 DIAGNOSIS — D649 Anemia, unspecified: Secondary | ICD-10-CM | POA: Diagnosis not present

## 2023-03-22 DIAGNOSIS — F5101 Primary insomnia: Secondary | ICD-10-CM | POA: Diagnosis not present

## 2023-03-22 LAB — LIPID PANEL
Cholesterol: 195 mg/dL (ref 0–200)
HDL: 102.1 mg/dL (ref >39.00)
LDL Cholesterol: 85 mg/dL (ref 0–99)
NonHDL: 93.05
Total CHOL/HDL Ratio: 2
Triglycerides: 41 mg/dL (ref 0.0–149.0)
VLDL: 8.2 mg/dL (ref 0.0–40.0)

## 2023-03-22 LAB — BASIC METABOLIC PANEL
BUN: 10 mg/dL (ref 6–23)
CO2: 29 meq/L (ref 19–32)
Calcium: 9.8 mg/dL (ref 8.4–10.5)
Chloride: 104 meq/L (ref 96–112)
Creatinine, Ser: 0.71 mg/dL (ref 0.40–1.20)
GFR: 104.51 mL/min (ref >60.00)
Glucose, Bld: 87 mg/dL (ref 70–99)
Potassium: 3.9 meq/L (ref 3.5–5.1)
Sodium: 140 meq/L (ref 135–145)

## 2023-03-22 LAB — HEPATIC FUNCTION PANEL
ALT: 15 U/L (ref 0–35)
AST: 30 U/L (ref 0–37)
Albumin: 4.4 g/dL (ref 3.5–5.2)
Alkaline Phosphatase: 98 U/L (ref 39–117)
Bilirubin, Direct: 0.1 mg/dL (ref 0.0–0.3)
Total Bilirubin: 0.6 mg/dL (ref 0.2–1.2)
Total Protein: 7.2 g/dL (ref 6.0–8.3)

## 2023-03-22 LAB — CBC WITH DIFFERENTIAL/PLATELET
Basophils Absolute: 0 10*3/uL (ref 0.0–0.1)
Basophils Relative: 0.8 % (ref 0.0–3.0)
Eosinophils Absolute: 0.1 10*3/uL (ref 0.0–0.7)
Eosinophils Relative: 2.1 % (ref 0.0–5.0)
HCT: 39.3 % (ref 36.0–46.0)
Hemoglobin: 12.9 g/dL (ref 12.0–15.0)
Lymphocytes Relative: 42 % (ref 12.0–46.0)
Lymphs Abs: 1.6 10*3/uL (ref 0.7–4.0)
MCHC: 32.7 g/dL (ref 30.0–36.0)
MCV: 89.9 fL (ref 78.0–100.0)
Monocytes Absolute: 0.4 10*3/uL (ref 0.1–1.0)
Monocytes Relative: 10 % (ref 3.0–12.0)
Neutro Abs: 1.7 10*3/uL (ref 1.4–7.7)
Neutrophils Relative %: 45.1 % (ref 43.0–77.0)
Platelets: 179 10*3/uL (ref 150.0–400.0)
RBC: 4.37 Mil/uL (ref 3.87–5.11)
RDW: 12.9 % (ref 11.5–15.5)
WBC: 3.7 10*3/uL — ABNORMAL LOW (ref 4.0–10.5)

## 2023-03-22 LAB — FOLATE: Folate: 22.9 ng/mL (ref >5.9)

## 2023-03-22 LAB — VITAMIN B12: Vitamin B-12: 504 pg/mL (ref 211–911)

## 2023-03-22 LAB — TSH: TSH: 1.2 u[IU]/mL (ref 0.35–5.50)

## 2023-03-22 LAB — T4, FREE: Free T4: 0.93 ng/dL (ref 0.60–1.60)

## 2023-03-22 MED ORDER — TRAZODONE HCL 50 MG PO TABS: 50.0000 mg | ORAL_TABLET | Freq: Every day | ORAL | 0 refills | Status: DC

## 2023-03-22 NOTE — Patient Instructions (Signed)
Thank you for trusting [place] with your healthcare.   Please go downstairs for labs.

## 2023-03-22 NOTE — Assessment & Plan Note (Signed)
Check labs and follow up 

## 2023-03-22 NOTE — Progress Notes (Signed)
New Patient Office Visit  Subjective    Patient ID: Natasha Torres, female    DOB: 04/03/1981  Age: 42 y.o. MRN: [id number]  CC:  Chief Complaint  Patient presents with   Establish Care    2x cancer survivor and has some PTSD w medical care. No bloodwork in 2 years, would like to see what that looks like    HPI Natasha Torres presents to establish care Previously saw Dr. Lawerance Bach  She has been under the care of Dr. Pamelia Hoit   Breast cancer of left breast in 2016. Double mastectomy with reconstruction. Chemotherapy. Took Tamoxifen.   Recurrence in 2022. Chemo-immunotherapy.  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   Integrative health practitioner   Takes Ativan 0.25 mg nightly for sleep and anxiety.      Outpatient Encounter Medications as of 03/22/2023  Medication Sig   Cholecalciferol (VITAMIN D3) 50 MCG (2000 UT) TABS Take 2,000 Units by mouth at bedtime.   Cyanocobalamin (B-12) 100 MCG TABS Take by mouth. Patient takes 1 tablet at lunch time.   letrozole (FEMARA) 2.5 MG tablet TAKE 1 TABLET BY MOUTH EVERY DAY   LORazepam (ATIVAN) 0.5 MG tablet Take 1 tablet (0.5 mg total) by mouth every 6 (six) hours as needed. for anxiety   Magnesium Hydroxide 400 MG CHEW Chew 400 mg by mouth at bedtime. Stopped on 12/5 until 05/29/21 surgery.   melatonin 5 MG TABS Take 5 mg by mouth at bedtime as needed. Pt stopped on 12/5 for 05/29/21 surgery.   Multiple Vitamins-Minerals (ADULT ONE DAILY GUMMIES PO) Take 2 tablets by mouth at bedtime. Stopped 12/5 until 05/29/21 surgery.   Omega-3 Fatty Acids (OMEGA 3 PO) Take by mouth.   traZODone (DESYREL) 50 MG tablet Take 1 tablet (50 mg total) by mouth at bedtime.   Black Pepper-Turmeric 3-500 MG CAPS Take by mouth. Stopped  on 12/5 for 05/29/21 surgery. (Patient not taking: Reported on 03/22/2023)   NON FORMULARY Echinacea Tincture PRN . Pt only takes if she feels like she is coming down with something. (Patient not taking: Reported on 03/22/2023)    [DISCONTINUED] folic acid (FOLVITE) 400 MCG tablet Take 400 mcg by mouth daily. Stopped on 12/5 until after 05/29/21 surgery. (Patient not taking: Reported on 03/22/2023)   No facility-administered encounter medications on file as of 03/22/2023.    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 of post-op N/V   History of chemotherapy 2016   for left breast cancer - carboplatin, taxotere, perjeta, herceptin   History of chemotherapy 2022   Began Kadcyla every 21 days x 4 cycles, 09/07/20 - 11/10/20. After surgery on 11/29/20 pt began maintenance Kadcyla for 1 year.   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 Double mastectomy 8/16, chemo     Recurrent breast cancer, left (HCC) dx'd 07/2020    Past Surgical History:  Procedure Laterality Date   BREAST BIOPSY  08/12/2020   BREAST  LUMPECTOMY Left 08/17/2014   BREAST RECONSTRUCTION WITH PLACEMENT OF TISSUE EXPANDER AND FLEX HD (ACELLULAR HYDRATED DERMIS) Bilateral 02/10/2015   Procedure: BREAST RECONSTRUCTION WITH PLACEMENT OF TISSUE EXPANDER AND FLEX HD (ACELLULAR HYDRATED DERMIS);  Surgeon: Glenna Fellows, MD;  Location: [facility] OR;  Service: Plastics;  Laterality: Bilateral;   INCISION AND DRAINAGE OF WOUND Left 03/02/2015   Procedure: IRRIGATION BREAST POCKET AND EXCHANGE OF LEFT BREAST TISSUE EXPANDER;  Surgeon: Glenna Fellows, MD;  [facility];  Service: Plastics;  Laterality: Left;   LAPAROSCOPIC SALPINGO OOPHERECTOMY Left 05/29/2021   Procedure: LAPAROSCOPIC LEFT  SALPINGO OOPHORECTOMY; PELVIC WASHING;  Surgeon: Ranae Pila, MD;  Location: [facility];  Service: Gynecology;  Laterality: Left;  Procedure #2   LIPOSUCTION WITH LIPOFILLING Bilateral 06/07/2015   Procedure: LIPOSUCTION WITH LIPOFILLING TO BILATERAL CHEST;  Surgeon: Glenna Fellows, MD;  Location: [facility];  Service: Plastics;  Laterality: Bilateral;   LIPOSUCTION WITH LIPOFILLING Bilateral 09/23/2015   Procedure: LIPOFILLING FROM BILATERAL THIGHS TO BILATERAL CHEST ;  Surgeon: Glenna Fellows, MD;  Location: [facility];  Service: Plastics;  Laterality: Bilateral;   LIPOSUCTION WITH LIPOFILLING Bilateral 06/15/2016   Procedure: LIPOFILLING FROM BILATERAL THIGH TO BILATERAL CHEST;  Surgeon: Glenna Fellows, MD;  Location: [facility];  Service: Plastics;  Laterality: Bilateral;  LIPOFILLING FROM BILATERAL THIGH TO BILATERAL CHEST   MASTECTOMY Bilateral 02/10/2015   NIPPLE SPARING MASTECTOMY/SENTINAL LYMPH NODE BIOPSY/RECONSTRUCTION/PLACEMENT OF TISSUE EXPANDER Bilateral 02/10/2015   Procedure: BILATERAL  NIPPLE SPARING MASTECTOMY WITH LEFT  SENTINAL LYMPH NODE BIOPSY(RIGHT BREAST PROPHYLACTIC);  Surgeon: Emelia Loron, MD;  Location: [facility] OR;  Service: General;  Laterality: Bilateral;   ORIF FINGER / THUMB FRACTURE Right 06/18/1992   thumb   PORT-A-CATH REMOVAL  01/07/2015   removed and replaced   PORT-A-CATH REMOVAL Right 09/23/2015   Procedure: REMOVAL PORT-A-CATH;  Surgeon: Glenna Fellows, MD;  Location: [facility];  Service: Plastics;  Laterality: Right;   PORT-A-CATH REMOVAL Right 05/29/2021   Procedure: REMOVAL PORT-A-CATH;  Surgeon: Emelia Loron, MD;  Location: [facility];  Service: General;  Laterality: Right;  procedure #1   PORTACATH PLACEMENT  08/2014; 01/07/2015   PORTACATH PLACEMENT Right 09/06/2020   Procedure: INSERTION PORT-A-CATH;  Surgeon: Emelia Loron, MD;   Location: [facility];  Service: General;  Laterality: Right;   RADIOACTIVE SEED GUIDED EXCISIONAL BREAST BIOPSY Left 11/29/2020   Procedure: RADIOACTIVE SEED GUIDED EXCISION OF LEFT BREAST CANCER x2;  Surgeon: Emelia Loron, MD;  Location: [facility];  Service: General;  Laterality: Left;   REMOVAL OF BILATERAL TISSUE EXPANDERS WITH PLACEMENT OF BILATERAL BREAST IMPLANTS Bilateral 06/07/2015   Procedure: REMOVAL OF BILATERAL TISSUE EXPANDERS WITH PLACEMENT OF BILATERAL BREAST  SILICONE IMPLANTS;  Surgeon: Glenna Fellows, MD;  Location:  SURGERY CENTER;  Service: Plastics;  Laterality: Bilateral;   REMOVAL OF TISSUE EXPANDER AND PLACEMENT OF IMPLANT Right 02/27/2015   Procedure: REMOVAL OF TISSUE EXPANDER AND PLACEMENT OF NEW TISSUE EXPANDER;  Surgeon: Glenna Fellows, MD;  [facility];  Service: Plastics;  Laterality: Right;   SALPINGOOPHORECTOMY Right 11/22/2010   due to dermoid cyst   TISSUE EXPANDER PLACEMENT Left 03/02/2015   Procedure: TISSUE EXPANDER;  Surgeon: Glenna Fellows, MD;  [facility];  Service: Plastics;  Laterality: Left;   WISDOM TOOTH EXTRACTION  06/19/1999    Family History  Problem Relation Age of Onset   Hypertension Mother    Anesthesia problems Mother  post-op N/V   Prostate cancer Father 16   Cancer Father        mouth; dx late 63   Depression Maternal Aunt    Prostate cancer Paternal Uncle        dx 36s   Dementia Maternal Grandmother    Heart attack Maternal Grandfather    Dementia Paternal Grandmother    Kidney cancer Paternal Grandmother        slow growing, no treatment   Cancer Paternal Grandfather    Prostate cancer Paternal Grandfather 8   Bone cancer Paternal Grandfather 18   Breast cancer Paternal Grandfather 6   Lung cancer Paternal Grandfather        dx late 41s; smoker.  thought to be a 4th primary cancer   Prostate cancer Other        dx 32s; MGM's brother   Lung cancer Other        PGM's  sister    Social History   Socioeconomic History   Marital status: Divorced    Spouse name: Cristal Deer   Number of children: 1   Years of education: Not on file   Highest education level: Not on file  Occupational History   Not on file  Tobacco Use   Smoking status: Never   Smokeless tobacco: Never  Vaping Use   Vaping status: Never Used  Substance and Sexual Activity   Alcohol use: Not Currently   Drug use: Yes    Types: Marijuana    Comment: Patient hasn't used in 2 years as of 05/24/21 per pt.   Sexual activity: Not Currently  Other Topics Concern   Not on file  Social History Narrative   ** Merged History Encounter **       Social Determinants of Health   Financial Resource Strain: Not on file  Food Insecurity: Not on file  Transportation Needs: Not on file  Physical Activity: Not on file  Stress: Not on file  Social Connections: Not on file  Intimate Partner Violence: Not on file    Review of Systems  Constitutional:  Negative for chills, fever and malaise/fatigue.  Respiratory:  Negative for shortness of breath.   Cardiovascular:  Negative for chest pain, palpitations and leg swelling.  Gastrointestinal:  Negative for abdominal pain, constipation, diarrhea, nausea and vomiting.  Genitourinary:  Negative for dysuria, frequency and urgency.  Neurological:  Negative for dizziness and focal weakness.  Psychiatric/Behavioral:  Negative for depression. The patient is nervous/anxious and has insomnia.         Objective    BP 104/66 (BP Location: Left Arm, Patient Position: Sitting, Cuff Size: Normal)   Pulse 60   Temp 97.8 F (36.6 C) (Temporal)   Ht 5\' 11"  (1.803 m)   Wt 154 lb (69.9 kg)   SpO2 98%   BMI 21.48 kg/m   Physical Exam Constitutional:      General: She is not in acute distress.    Appearance: She is not ill-appearing.  Eyes:     Extraocular Movements: Extraocular movements intact.     Conjunctiva/sclera: Conjunctivae normal.   Cardiovascular:     Rate and Rhythm: Normal rate and regular rhythm.  Pulmonary:     Effort: Pulmonary effort is normal.     Breath sounds: Normal breath sounds.  Musculoskeletal:     Cervical back: Normal range of motion and neck supple.     Right lower leg: No edema.     Left lower leg: No edema.  Skin:    General: Skin is warm and dry.  Neurological:     General: No focal deficit present.     Mental Status: She is alert and oriented to person, place, and time.  Psychiatric:        Mood and Affect: Mood normal.        Behavior: Behavior normal.        Thought Content: Thought content normal.         Assessment & Plan:   Problem List Items Addressed This Visit       Other   Anemia    Check labs and follow up       Relevant Orders   CBC with Differential/Platelet (Completed)   Iron, TIBC and Ferritin Panel   Folate (Completed)   Vitamin B12 (Completed)   Basic metabolic panel (Completed)   Elevated liver enzymes    Check hepatic panel      Relevant Orders   Basic metabolic panel (Completed)   Hepatic function panel (Completed)   Primary insomnia - Primary    Encouraged her to wean off and stop Ativan for sleep. Trazodone prescribed. Counseling on good sleep hygiene. Encourage counseling.       Relevant Orders   TSH (Completed)   T4, free (Completed)   Basic metabolic panel (Completed)   Hepatic function panel (Completed)   Other Visit Diagnoses     History of breast cancer       Family history of diabetes mellitus (DM)       Family history of elevated blood lipids       Relevant Orders   Lipid panel (Completed)       Return for f/u 4-6 wks office or virtual .   Hetty Blend, NP-C

## 2023-03-22 NOTE — Assessment & Plan Note (Addendum)
Encouraged her to wean off and stop Ativan for sleep. Trazodone prescribed. Counseling on good sleep hygiene. Encourage counseling.

## 2023-03-22 NOTE — Assessment & Plan Note (Signed)
Check hepatic panel

## 2023-03-23 LAB — IRON,TIBC AND FERRITIN PANEL
%SAT: 37 % (ref 16–45)
Ferritin: 41 ng/mL (ref 16–232)
Iron: 120 ug/dL (ref 40–190)
TIBC: 323 ug/dL (ref 250–450)

## 2023-03-25 ENCOUNTER — Encounter: Payer: Self-pay | Admitting: Family Medicine

## 2023-03-26 NOTE — Telephone Encounter (Signed)
Just fyi.

## 2023-04-22 ENCOUNTER — Encounter: Payer: Self-pay | Admitting: Plastic Surgery

## 2023-05-03 ENCOUNTER — Telehealth (INDEPENDENT_AMBULATORY_CARE_PROVIDER_SITE_OTHER): Payer: BC Managed Care – PPO | Admitting: Family Medicine

## 2023-05-03 ENCOUNTER — Encounter: Payer: Self-pay | Admitting: Family Medicine

## 2023-05-03 ENCOUNTER — Telehealth: Payer: BC Managed Care – PPO | Admitting: Internal Medicine

## 2023-05-03 DIAGNOSIS — Z853 Personal history of malignant neoplasm of breast: Secondary | ICD-10-CM

## 2023-05-03 DIAGNOSIS — F5101 Primary insomnia: Secondary | ICD-10-CM | POA: Diagnosis not present

## 2023-05-03 DIAGNOSIS — F419 Anxiety disorder, unspecified: Secondary | ICD-10-CM | POA: Diagnosis not present

## 2023-05-03 MED ORDER — LORAZEPAM 0.5 MG PO TABS: 0.5000 mg | ORAL_TABLET | Freq: Every day | ORAL | 0 refills | Status: DC | PRN

## 2023-05-03 NOTE — Progress Notes (Signed)
MyChart Video Visit    Virtual Visit via Video Note    Patient location: Home. Patient and provider in visit Provider location: Office  I discussed the limitations of evaluation and management by telemedicine and the availability of in person appointments. The patient expressed understanding and agreed to proceed.  Visit Date: 05/03/2023  Today's healthcare provider: Hetty Blend, NP-C     Subjective:    Patient ID: Natasha Torres, female    DOB: [birth year], 42 y.o.   MRN: [id number]  Chief Complaint  Patient presents with   Insomnia    6 week f/u for insomnia. Meds given made her feel terrible, major brain fog, dizziness, did start with half a pill for a couple days she did state giving it sometime but she couldn't do it. she went back Lorazepam after traveling.[name] She wants to know if theres something similar to lorazepam but keep lorazepam as a back up in case.     Insomnia    Follow upon anxiety and insomnia.   Trazodone made her feel bad, she had fatigue and brain fog and dizziness.   She went back to lorazepam 0.25 mg but only taking it as needed and not nightly.   States she is on a wait list for a counselor.   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 of post-op N/V   History of chemotherapy 2016   for left breast cancer - carboplatin, taxotere, perjeta, herceptin   History of chemotherapy 2022   Began Kadcyla every 21 days x 4 cycles, 09/07/20 - 11/10/20. After surgery on 11/29/20 pt began maintenance Kadcyla for 1 year.   Invasive ductal carcinoma of left breast (HCC)  09/06/2014   Malignant neoplasm of left breast (HCC) 09/17/2014   ATM gene + Breast cancer 2016 ER+ PR neg and HER 2 positive. Double mastectomy 8/16, chemo     Recurrent breast cancer, left (HCC) dx'd 07/2020    Past Surgical History:  Procedure Laterality Date   BREAST BIOPSY  08/12/2020   BREAST LUMPECTOMY Left 08/17/2014   BREAST RECONSTRUCTION WITH PLACEMENT OF TISSUE EXPANDER AND FLEX HD (ACELLULAR HYDRATED DERMIS) Bilateral 02/10/2015   Procedure: BREAST RECONSTRUCTION WITH PLACEMENT OF TISSUE EXPANDER AND FLEX HD (ACELLULAR HYDRATED DERMIS);  Surgeon: Glenna Fellows, MD;  Location: [facility] OR;  Service: Plastics;  Laterality: Bilateral;   INCISION AND DRAINAGE OF WOUND Left 03/02/2015   Procedure: IRRIGATION BREAST POCKET AND EXCHANGE OF LEFT BREAST TISSUE EXPANDER;  Surgeon: Glenna Fellows, MD;  Location[facility];  Service: Plastics;  Laterality: Left;   LAPAROSCOPIC SALPINGO OOPHERECTOMY Left 05/29/2021   Procedure: LAPAROSCOPIC LEFT SALPINGO OOPHORECTOMY; PELVIC WASHING;  Surgeon: Ranae Pila, MD;  Location: [facility];  Service: Gynecology;  Laterality: Left;  Procedure #2   LIPOSUCTION WITH LIPOFILLING Bilateral 06/07/2015   Procedure: LIPOSUCTION WITH LIPOFILLING TO BILATERAL CHEST;  Surgeon: Glenna Fellows, MD;  Location: [facility];  Service: Plastics;  Laterality: Bilateral;   LIPOSUCTION WITH LIPOFILLING Bilateral 09/23/2015   Procedure: LIPOFILLING FROM BILATERAL THIGHS  TO BILATERAL CHEST ;  Surgeon: Glenna Fellows, MD;  Location: [facility];  Service: Plastics;  Laterality: Bilateral;   LIPOSUCTION WITH LIPOFILLING Bilateral 06/15/2016   Procedure: LIPOFILLING FROM BILATERAL THIGH TO BILATERAL CHEST;  Surgeon: Glenna Fellows, MD;  Location: [facility];  Service: Plastics;  Laterality: Bilateral;  LIPOFILLING FROM BILATERAL THIGH TO BILATERAL CHEST   MASTECTOMY Bilateral 02/10/2015   NIPPLE SPARING  MASTECTOMY/SENTINAL LYMPH NODE BIOPSY/RECONSTRUCTION/PLACEMENT OF TISSUE EXPANDER Bilateral 02/10/2015   Procedure: BILATERAL  NIPPLE SPARING MASTECTOMY WITH LEFT  SENTINAL LYMPH NODE BIOPSY(RIGHT BREAST PROPHYLACTIC);  Surgeon: Emelia Loron, MD;  Location: [facility] OR;  Service: General;  Laterality: Bilateral;   ORIF FINGER / THUMB FRACTURE Right 06/18/1992   thumb   PORT-A-CATH REMOVAL  01/07/2015   removed and replaced   PORT-A-CATH REMOVAL Right 09/23/2015   Procedure: REMOVAL PORT-A-CATH;  Surgeon: Glenna Fellows, MD;  Location: [facility];  Service: Plastics;  Laterality: Right;   PORT-A-CATH REMOVAL Right 05/29/2021   Procedure: REMOVAL PORT-A-CATH;  Surgeon: Emelia Loron, MD;  Location: [facility];  Service: General;  Laterality: Right;  procedure #1   PORTACATH PLACEMENT  08/2014; 01/07/2015   PORTACATH PLACEMENT Right 09/06/2020   Procedure: INSERTION PORT-A-CATH;  Surgeon: Emelia Loron, MD;  Location: [facility];  Service: General;  Laterality: Right;   RADIOACTIVE SEED GUIDED EXCISIONAL BREAST BIOPSY Left 11/29/2020   Procedure: RADIOACTIVE SEED GUIDED EXCISION OF LEFT BREAST CANCER x2;  Surgeon: Emelia Loron, MD;  Location: [facility];  Service: General;  Laterality: Left;   REMOVAL OF BILATERAL TISSUE EXPANDERS WITH PLACEMENT OF BILATERAL BREAST IMPLANTS Bilateral 06/07/2015   Procedure: REMOVAL OF BILATERAL TISSUE EXPANDERS WITH PLACEMENT OF BILATERAL BREAST  SILICONE IMPLANTS;  Surgeon: Glenna Fellows, MD;  Location: [facility];  Service: Plastics;  Laterality: Bilateral;   REMOVAL OF TISSUE EXPANDER AND PLACEMENT OF IMPLANT Right 02/27/2015   Procedure: REMOVAL OF TISSUE EXPANDER AND PLACEMENT OF NEW TISSUE EXPANDER;  Surgeon: Glenna Fellows, MD;  Location: [facility];  Service: Plastics;  Laterality: Right;   SALPINGOOPHORECTOMY Right 11/22/2010   due to dermoid cyst   TISSUE EXPANDER PLACEMENT Left  03/02/2015   Procedure: TISSUE EXPANDER;  Surgeon: Glenna Fellows, MD;  Location: [facility];  Service: Plastics;  Laterality: Left;   WISDOM TOOTH EXTRACTION  06/19/1999    Family History  Problem Relation Age of Onset   Hypertension Mother    Anesthesia problems Mother        post-op N/V   Prostate cancer Father 69   Cancer Father        mouth; dx late 75   Depression Maternal Aunt    Prostate cancer Paternal Uncle        dx 57s   Dementia Maternal Grandmother    Heart attack Maternal Grandfather    Dementia Paternal Grandmother    Kidney cancer Paternal Grandmother        slow growing, no treatment   Cancer Paternal Grandfather    Prostate cancer Paternal Grandfather 65   Bone cancer Paternal Grandfather 72   Breast cancer Paternal Grandfather 56   Lung cancer Paternal Grandfather        dx late 60s; smoker.  thought to be a 4th primary cancer   Prostate cancer Other        dx 20s; MGM's brother   Lung cancer Other        PGM's sister    Social History   Socioeconomic  History   Marital status: Divorced    Spouse name: Cristal Deer   Number of children: 1   Years of education: Not on file   Highest education level: Not on file  Occupational History   Not on file  Tobacco Use   Smoking status: Never   Smokeless tobacco: Never  Vaping Use   Vaping status: Never Used  Substance and Sexual Activity   Alcohol use: Not Currently   Drug use: Yes    Types: Marijuana    Comment: Patient hasn't used in 2 years as of 05/24/21 per pt.   Sexual activity: Not Currently  Other Topics Concern   Not on file  Social History Narrative   ** Merged History Encounter **       Social Determinants of Health   Financial Resource Strain: Not on file  Food Insecurity: Not on file  Transportation Needs: Not on file  Physical Activity: Not on file  Stress: Not on file  Social Connections: Not on file  Intimate Partner Violence: Not on file    Outpatient Medications Prior to  Visit  Medication Sig Dispense Refill   Cholecalciferol (VITAMIN D3) 50 MCG (2000 UT) TABS Take 2,000 Units by mouth at bedtime.     Cyanocobalamin (B-12) 100 MCG TABS Take by mouth. Patient takes 1 tablet at lunch time.     letrozole (FEMARA) 2.5 MG tablet TAKE 1 TABLET BY MOUTH EVERY DAY 90 tablet 3   Magnesium Hydroxide 400 MG CHEW Chew 400 mg by mouth at bedtime. Stopped on 12/5 until 05/29/21 surgery.     melatonin 5 MG TABS Take 5 mg by mouth at bedtime as needed. Pt stopped on 12/5 for 05/29/21 surgery.     Multiple Vitamins-Minerals (ADULT ONE DAILY GUMMIES PO) Take 2 tablets by mouth at bedtime. Stopped 12/5 until 05/29/21 surgery.     NON FORMULARY Echinacea Tincture PRN . Pt only takes if she feels like she is coming down with something.     Omega-3 Fatty Acids (OMEGA 3 PO) Take by mouth.     traZODone (DESYREL) 50 MG tablet Take 1 tablet (50 mg total) by mouth at bedtime. 90 tablet 0   LORazepam (ATIVAN) 0.5 MG tablet Take 1 tablet (0.5 mg total) by mouth every 6 (six) hours as needed. for anxiety 30 tablet 3   Black Pepper-Turmeric 3-500 MG CAPS Take by mouth. Stopped  on 12/5 for 05/29/21 surgery. (Patient not taking: Reported on 05/03/2023)     No facility-administered medications prior to visit.    Allergies  Allergen Reactions   Tramadol Nausea Only   Buprenorphine Hcl Itching   Morphine And Codeine Itching   Other Rash    DERMABOND   Penicillins Itching   Tegaderm Ag Mesh [Silver] Rash    Review of Systems  Constitutional:  Negative for chills and fever.  Respiratory:  Negative for shortness of breath.   Cardiovascular:  Negative for chest pain and palpitations.  Gastrointestinal:  Negative for nausea and vomiting.  Psychiatric/Behavioral:  The patient is nervous/anxious and has insomnia.        Objective:    Physical Exam  There were no vitals taken for this visit. Wt Readings from Last 3 Encounters:  03/22/23 154 lb (69.9 kg)  06/14/21 160 lb (72.6 kg)   05/29/21 160 lb 12.8 oz (72.9 kg)   Alert and oriented and in no acute distress. Respirations unlabored. Norma speech and mood.     Assessment & Plan:   Problem  List Items Addressed This Visit     Primary insomnia   Relevant Medications   LORazepam (ATIVAN) 0.5 MG tablet   Other Visit Diagnoses     Anxiety    -  Primary   Relevant Medications   LORazepam (ATIVAN) 0.5 MG tablet      Continue good sleep hygiene. She prefers to not take trazodone.  Schedule with therapist when possible to do so.  Take lorazepam 1/2 tablet prn.  Discussed studies showing possible increased risk of dementia r/t chronic daily benzo use.  Follow up for low WBC count in 2 months.  Follow up with Dr. Pamelia Hoit and plastic surgeon as recommended.   I have changed Rinnah C. Winkel's LORazepam. I am also having her maintain her Multiple Vitamins-Minerals (ADULT ONE DAILY GUMMIES PO), Magnesium Hydroxide, Vitamin D3, B-12, Black Pepper-Turmeric, NON FORMULARY, melatonin, letrozole, Omega-3 Fatty Acids (OMEGA 3 PO), and traZODone.  Meds ordered this encounter  Medications   LORazepam (ATIVAN) 0.5 MG tablet    Sig: Take 1 tablet (0.5 mg total) by mouth daily as needed. for anxiety    Dispense:  30 tablet    Refill:  0    This request is for a new prescription for a controlled substance as required by Federal/State law..    Order Specific Question:   Supervising Provider    Answer:   Hillard Danker A [[id number]]    I discussed the assessment and treatment plan with the patient. The patient was provided an opportunity to ask questions and all were answered. The patient agreed with the plan and demonstrated an understanding of the instructions.   The patient was advised to call back or seek an in-person evaluation if the symptoms worsen or if the condition fails to improve as anticipated.    Hetty Blend, NP-C [facility] [phone] (phone) [phone]  (fax)  [facility]

## 2023-05-04 ENCOUNTER — Encounter: Payer: Self-pay | Admitting: Family Medicine

## 2023-06-17 ENCOUNTER — Other Ambulatory Visit: Payer: Self-pay | Admitting: Family Medicine

## 2023-06-17 NOTE — Telephone Encounter (Signed)
LVV: 05/03/23 Last fill: 03/22/23, 90 tablet 0 refill

## 2023-06-18 NOTE — Telephone Encounter (Signed)
Called pt, she reports she does not take or need this medication anymore.

## 2023-06-28 ENCOUNTER — Ambulatory Visit
Admission: RE | Admit: 2023-06-28 | Discharge: 2023-06-28 | Disposition: A | Discharge status: Home / Self Care | Payer: 59 | Source: Ambulatory Visit | Attending: Plastic Surgery

## 2023-06-28 DIAGNOSIS — N63 Unspecified lump in unspecified breast: Secondary | ICD-10-CM

## 2023-07-01 ENCOUNTER — Other Ambulatory Visit: Payer: Self-pay | Admitting: Plastic Surgery

## 2023-07-01 DIAGNOSIS — N6310 Unspecified lump in the right breast, unspecified quadrant: Secondary | ICD-10-CM

## 2023-07-05 ENCOUNTER — Ambulatory Visit (INDEPENDENT_AMBULATORY_CARE_PROVIDER_SITE_OTHER): Payer: 59 | Admitting: Family Medicine

## 2023-07-05 ENCOUNTER — Encounter: Payer: Self-pay | Admitting: Family Medicine

## 2023-07-05 VITALS — BP 122/74 | HR 88 | Temp 98.8°F | Ht 71.0 in | Wt 154.0 lb

## 2023-07-05 DIAGNOSIS — R062 Wheezing: Secondary | ICD-10-CM

## 2023-07-05 DIAGNOSIS — R051 Acute cough: Secondary | ICD-10-CM

## 2023-07-05 MED ORDER — ALBUTEROL SULFATE HFA 108 (90 BASE) MCG/ACT IN AERS: 2.0000 | INHALATION_SPRAY | Freq: Four times a day (QID) | RESPIRATORY_TRACT | 0 refills | Status: DC | PRN

## 2023-07-05 NOTE — Patient Instructions (Signed)
I have sent in an albuterol inhaler for you to use 2 puffs every 4 hours as needed for wheezing.  Follow-up with me for new or worsening symptoms.

## 2023-07-05 NOTE — Progress Notes (Signed)
   Acute Office Visit  Subjective:     Patient ID: Natasha Torres, female    DOB: 1981/05/25, 43 y.o.   MRN: [id number]  Chief Complaint  Patient presents with   Cough    Symptoms of cough, swollen tonsils, sore throat, emesis initially. Developed into some laryngitis. Symptoms remaining cough and swollen tonsils. Whole span of symptoms has been 2 weeks. Requesting something to assist when coughing (notes of shortness of breath when coughing for extended periods of time)    HPI Patient is in today for evaluation of cough, for the last 2 weeks. Reports recent viral illness, states that the cough seems to be lingering. States that she will have coughing fits at times, states she feels like she cannot take a deep enough breath. Denies known sick contacts. Denies abdominal pain, nausea, vomiting, diarrhea, rash, fever, chills, other symptoms.  Medical hx as outlined below.  ROS Per HPI      Objective:    BP 122/74   Pulse 88   Temp 98.8 F (37.1 C)   Ht 5\' 11"  (1.803 m)   Wt 154 lb (69.9 kg)   SpO2 99%   BMI 21.48 kg/m    Physical Exam Vitals and nursing note reviewed.  Constitutional:      General: She is not in acute distress. HENT:     Head: Normocephalic and atraumatic.     Right Ear: Tympanic membrane and ear canal normal.     Left Ear: Tympanic membrane and ear canal normal.     Nose: No congestion.     Mouth/Throat:     Mouth: Mucous membranes are moist.     Pharynx: Oropharynx is clear. Posterior oropharyngeal erythema present. No oropharyngeal exudate.     Comments: Oropharyngeal cobblestoning   Eyes:     Extraocular Movements: Extraocular movements intact.  Cardiovascular:     Rate and Rhythm: Normal rate and regular rhythm.  Pulmonary:     Effort: Pulmonary effort is normal. No respiratory distress.     Breath sounds: Wheezing present. No rhonchi or rales.     Comments: Dry cough Musculoskeletal:     Cervical back: Normal range of motion and neck  supple.  Lymphadenopathy:     Cervical: Cervical adenopathy present.  Skin:    General: Skin is warm and dry.  Neurological:     Mental Status: She is alert.    No results found for any visits on 07/05/23.      Assessment & Plan:  1. Acute cough (Primary)  - albuterol (VENTOLIN HFA) 108 (90 Base) MCG/ACT inhaler; Inhale 2 puffs into the lungs every 6 (six) hours as needed for wheezing or shortness of breath.  Dispense: 8 g; Refill: 0  2. Wheezing  - albuterol (VENTOLIN HFA) 108 (90 Base) MCG/ACT inhaler; Inhale 2 puffs into the lungs every 6 (six) hours as needed for wheezing or shortness of breath.  Dispense: 8 g; Refill: 0   Meds ordered this encounter  Medications   albuterol (VENTOLIN HFA) 108 (90 Base) MCG/ACT inhaler    Sig: Inhale 2 puffs into the lungs every 6 (six) hours as needed for wheezing or shortness of breath.    Dispense:  8 g    Refill:  0    Return if symptoms worsen or fail to improve.  Moshe Cipro, FNP

## 2023-07-11 ENCOUNTER — Telehealth: Payer: Self-pay | Admitting: Family Medicine

## 2023-07-11 ENCOUNTER — Other Ambulatory Visit: Payer: Self-pay | Admitting: Family Medicine

## 2023-07-11 DIAGNOSIS — D72819 Decreased white blood cell count, unspecified: Secondary | ICD-10-CM

## 2023-07-11 NOTE — Telephone Encounter (Signed)
Pt has lab visit scheduled for 08/09/2023, please enter lab orders for this visit.

## 2023-08-07 ENCOUNTER — Encounter: Payer: Self-pay | Admitting: Genetic Counselor

## 2023-08-09 ENCOUNTER — Other Ambulatory Visit (INDEPENDENT_AMBULATORY_CARE_PROVIDER_SITE_OTHER): Payer: 59

## 2023-08-09 ENCOUNTER — Other Ambulatory Visit: Payer: 59

## 2023-08-09 ENCOUNTER — Encounter: Payer: Self-pay | Admitting: Family Medicine

## 2023-08-09 DIAGNOSIS — D72819 Decreased white blood cell count, unspecified: Secondary | ICD-10-CM

## 2023-08-09 LAB — CBC WITH DIFFERENTIAL/PLATELET
Basophils Absolute: 0 10*3/uL (ref 0.0–0.1)
Basophils Relative: 1.1 % (ref 0.0–3.0)
Eosinophils Absolute: 0.1 10*3/uL (ref 0.0–0.7)
Eosinophils Relative: 2 % (ref 0.0–5.0)
HCT: 38.8 % (ref 36.0–46.0)
Hemoglobin: 12.8 g/dL (ref 12.0–15.0)
Lymphocytes Relative: 43.2 % (ref 12.0–46.0)
Lymphs Abs: 1.8 10*3/uL (ref 0.7–4.0)
MCHC: 33.1 g/dL (ref 30.0–36.0)
MCV: 90.4 fL (ref 78.0–100.0)
Monocytes Absolute: 0.4 10*3/uL (ref 0.1–1.0)
Monocytes Relative: 8.5 % (ref 3.0–12.0)
Neutro Abs: 1.9 10*3/uL (ref 1.4–7.7)
Neutrophils Relative %: 45.2 % (ref 43.0–77.0)
Platelets: 189 10*3/uL (ref 150.0–400.0)
RBC: 4.29 Mil/uL (ref 3.87–5.11)
RDW: 12.8 % (ref 11.5–15.5)
WBC: 4.1 10*3/uL (ref 4.0–10.5)

## 2023-10-24 ENCOUNTER — Inpatient Hospital Stay: Payer: BC Managed Care – PPO | Attending: Hematology and Oncology | Admitting: Hematology and Oncology

## 2023-10-24 ENCOUNTER — Telehealth: Payer: Self-pay

## 2023-10-24 DIAGNOSIS — Z1589 Genetic susceptibility to other disease: Secondary | ICD-10-CM | POA: Diagnosis not present

## 2023-10-24 DIAGNOSIS — Z170 Estrogen receptor positive status [ER+]: Secondary | ICD-10-CM | POA: Diagnosis not present

## 2023-10-24 DIAGNOSIS — Z780 Asymptomatic menopausal state: Secondary | ICD-10-CM

## 2023-10-24 DIAGNOSIS — Z1501 Genetic susceptibility to malignant neoplasm of breast: Secondary | ICD-10-CM

## 2023-10-24 DIAGNOSIS — Z1509 Genetic susceptibility to other malignant neoplasm: Secondary | ICD-10-CM

## 2023-10-24 DIAGNOSIS — C50412 Malignant neoplasm of upper-outer quadrant of left female breast: Secondary | ICD-10-CM | POA: Diagnosis not present

## 2023-10-24 NOTE — Assessment & Plan Note (Signed)
 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 Neo adj chemo with TCHP foll by Herceptin  maintenance (path CR: Bil mastectomies and reconstruction), Tamoxifen  (stoped and started due to intolerance) ATM gene mutation   08/12/20 Relapsed Breast Cancer: Left Breast UOQ 2.1 cm and 1.5 cm. Indeterminate 1.8 cm and Right breast indeterminate 1.5 cm.  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  chemotherapy with Kadcyla  X 4 cycles completed 11/10/2020 2. Surgery 11/29/2020: Pathologic complete response 3. Followed by XRT (have to discuss the pros and cons of radiation given ATM mutation) 4. Further adjuvant antiestrogen therapy (ovarian suppression or oophorectomy plus Letrozole ) Zoladex  monthly to her treatment plan since 09/07/20 ------------------------------------------------------------------ Antiestrogen therapy with Zoladex  with Letrozole  (now Letrozole  alone since oophorectomy 05/29/21)  Current Treatment: Letrozole  Letrozole  toxicities: Rare hot flashes   Breast MRI 10/22/2022: Benign Breast ultrasound 06/28/2023: 2 stable probably benign masses at 1:00 and 3:00 right breast Plan is to recheck an ultrasound in July 2025.  MRIs every 2 years Bone density 11/09/2022: Osteopenia T-score -1.9: Weightbearing exercises and calcium and vitamin D    Telephone visit in 1 year

## 2023-10-24 NOTE — Progress Notes (Signed)
 HEMATOLOGY-ONCOLOGY TELEPHONE VISIT PROGRESS NOTE  I connected with our patient on 10/24/23 at  8:15 AM EDT by telephone and verified that I am speaking with the correct person using two identifiers.  I discussed the limitations, risks, security and privacy concerns of performing an evaluation and management service by telephone and the availability of in person appointments.  I also discussed with the patient that there may be a patient responsible charge related to this service. The patient expressed understanding and agreed to proceed.   History of Present Illness:   History of Present Illness Natasha Torres is a 43 year old female with breast cancer who presents for follow-up regarding ongoing treatment and monitoring.  She has been on letrozole  since March 2022, experiencing hot flashes, particularly with alcohol, caffeine, stress, and poor sleep. Occasional joint pain and inflammation are managed holistically.  She underwent a bilateral mastectomy and monitors for fat necrosis in the right breast. Initially, she felt some lumps, which resolved after six months, with no change in size on recent evaluations. She is scheduled for bone density scans every two years, with the next one due next year. Regular ultrasounds occur every six months, with the next one anticipated in July.  She inquires about the Gardant Reveal test, a blood-based diagnostic to identify microscopic DNA in the bloodstream, expressing interest in this test for monitoring purposes.    Oncology History  Breast cancer of upper-outer quadrant of left female breast (HCC)  08/13/2014 Mammogram   Left breast: 2 cm circumscribed mass   08/13/2014 Breast US    Left breast: two masses: #1: 1:00 10 x 9 x 13 mmm irregular; #2: 2:00: 16 x 7 x 14 mm; no LAD   08/27/2014 Initial Diagnosis   O/S excisional biopsy: 2 masses showing 1.5 cm and 0.9 cm IDC, Grade 3, ER + (90%), PR- (0%), HER-2 positive (ratio 2.5), Ki67 ~30%,  Multifocal   08/27/2014 Clinical Stage   Stage IIA/IIB: T2/T3 N0   09/17/2014 Procedure   MyRisk panel ([facility]) 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 Otherwise negative at APC, ATM, BARD1, BMPR1A, BRCA1, BRCA2, BRIP1, CHD1, CDK4, CDKN2A, CHEK2, MLH1, MSH2, MSH6, MUTYH, NBN, PALB2, PMS2, PTEN, RAD51C, RAD51D, SMAD4, STK11, and TP53   09/20/2014 Breast MRI   RIGHT: 1 x 0.8 x 0.9 cm lobulated border mass in the retroareolar lower slight lateral area. LEFT: hematoma with surrounding adjacent enhancement encompassing a 4.6 x 4.9 x 4.1 cm area.    09/27/2014 - 01/10/2015 Neo-Adjuvant Chemotherapy   Neoadjuvant TCH Perjeta  every 3 week 6 followed by Herceptin  maintenance completed 09/21/2015   01/13/2015 Breast MRI   Postsurgical changes in left breast without residual enhancing masses compatible with treatment response   02/10/2015 Definitive Surgery   Bilateral mastectomies: RIGHT: benign.  LEFT: complete path response;  0/3 sentinel nodes   02/10/2015 Pathologic Stage   ypT0 ypN0 ypM0   02/22/2015 -  Anti-estrogen oral therapy   Zoladex  with tamoxifen , stopped Zoladex  for intolerance, decreased tamoxifen  to 10 mg daily.   11/17/2015 Survivorship   Survivorship care plan completed and given to patient    Relapse/Recurrence   Within the upper-outer left breast anterior depth there are 2 adjacent irregular enhancing masses (2.1 cm and 1.5 cm) compatible with biopsy-proven malignancy. Indeterminate enhancing mass within the outer left breast posterior depth (1.8 cm). Indeterminate enhancing mass within the outer lower posterior right breast. 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 ER 80%, PR  0%, Her 2 Positive   08/12/2020 Cancer Staging   Staging form: Breast, AJCC 8th Edition - Pathologic stage from 08/12/2020: Stage IA (pT1b, pN0, cM0, G2, ER+, PR-, HER2+) - Signed by Percival Brace, NP on 08/24/2020 Stage prefix: Initial  diagnosis Histologic grading system: 3 grade system   09/07/2020 - 05/05/2021 Chemotherapy   Patient is on Treatment Plan : BREAST ADO-Trastuzumab Emtansine  (Kadcyla ) q21d     11/29/2020 Surgery   Left anterior lumpectomy: Benign, left posterior lumpectomy: Benign, left posterior tissue lateral margin excision: Benign with 3 intramammary lymph nodes negative for malignancy.  Left posterior tissue superior margin: Benign     REVIEW OF SYSTEMS:   Constitutional: Denies fevers, chills or abnormal weight loss All other systems were reviewed with the patient and are negative. Observations/Objective:     Assessment Plan:  Breast cancer of upper-outer quadrant of left female breast 08/13/2014: Left breast IDC. Neo adj chemo with TCHP foll by Herceptin  maintenance (path CR: Bil mastectomies and reconstruction), Tamoxifen  (stoped and started due to intolerance) ATM gene mutation   08/12/20 Relapsed Breast Cancer: Left Breast UOQ 2.1 cm and 1.5 cm. Indeterminate 1.8 cm and Right breast indeterminate 1.5 cm.  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  chemotherapy with Kadcyla  X 4 cycles completed 11/10/2020 2. Surgery 11/29/2020: Pathologic complete response 3. Followed by XRT (have to discuss the pros and cons of radiation given ATM mutation) 4. Further adjuvant antiestrogen therapy (ovarian suppression or oophorectomy plus Letrozole ) Zoladex  monthly to her treatment plan since 09/07/20 ------------------------------------------------------------------ Antiestrogen therapy with Zoladex  with Letrozole  (now Letrozole  alone since oophorectomy 05/29/21)  Current Treatment: Letrozole  Letrozole  toxicities: Rare hot flashes   Breast MRI 10/22/2022: Benign Breast ultrasound 06/28/2023: 2 stable probably benign masses at 1:00 and  3:00 right breast Plan is to recheck an ultrasound in July 2025.  MRIs every 2 years Bone density 11/09/2022: Osteopenia T-score -1.9: Weightbearing exercises and calcium and vitamin D    Telephone visit in 1 year --------------------------------- Assessment and Plan Assessment & Plan Breast cancer of left breast On letrozole  for three years without recurrence. Bone density is a concern due to letrozole . Hot flashes and joint pain managed holistically. Discussed Gardant Reveal for non-invasive monitoring of recurrence. Insurance coverage varies, but no billing if not covered. - Continue letrozole  for seven years total. - Order Gardant Reveal test. - Schedule bone density scan next year. - Schedule breast MRI next year. - Ensure letrozole  supply and refill as needed.  Bone density concern Bone density concern due to long-term letrozole  use. - Schedule bone density scan next year.  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 No change on recent ultrasound. No palpable masses. - Monitor with self-exams and ultrasounds every six months.      I discussed the assessment and treatment plan with the patient. The patient was provided an opportunity to ask questions and all were answered. The patient agreed with the plan and demonstrated an understanding of the instructions. The patient was advised to call back or seek an in-person evaluation if the symptoms worsen or if the condition fails to improve as anticipated.   I provided 20 minutes of non-face-to-face time during this encounter.  This includes time for charting and coordination of care   Viinay K Kalee Broxton,  MD

## 2023-10-24 NOTE — Telephone Encounter (Signed)
 Per md orders entered for Guardant Reveal and all supported documents faxed to [phone]. Faxed confirmation was received.

## 2023-11-03 ENCOUNTER — Other Ambulatory Visit: Payer: Self-pay | Admitting: Family Medicine

## 2023-11-03 DIAGNOSIS — F419 Anxiety disorder, unspecified: Secondary | ICD-10-CM

## 2023-11-03 DIAGNOSIS — F5101 Primary insomnia: Secondary | ICD-10-CM

## 2023-11-04 DIAGNOSIS — F419 Anxiety disorder, unspecified: Secondary | ICD-10-CM | POA: Insufficient documentation

## 2023-11-04 NOTE — Telephone Encounter (Signed)
 LOV: 07/05/23 Augustus Ledger) 05/03/23 (VV with you) Last fill: 05/03/23, 30 tablet 0 refill PRN

## 2023-11-12 ENCOUNTER — Telehealth: Payer: Self-pay | Admitting: *Deleted

## 2023-11-12 NOTE — Telephone Encounter (Signed)
 Per MD request RN placed call to pt with recent Guardant Reveal results being negative.  Pt educated and verbalized understanding.

## 2023-11-15 ENCOUNTER — Encounter: Payer: Self-pay | Admitting: Hematology and Oncology

## 2023-12-31 ENCOUNTER — Ambulatory Visit
Admission: RE | Admit: 2023-12-31 | Discharge: 2023-12-31 | Disposition: A | Discharge status: Home / Self Care | Source: Ambulatory Visit | Attending: Plastic Surgery | Admitting: Plastic Surgery

## 2023-12-31 DIAGNOSIS — N6310 Unspecified lump in the right breast, unspecified quadrant: Secondary | ICD-10-CM

## 2024-01-21 ENCOUNTER — Encounter: Payer: Self-pay | Admitting: Hematology and Oncology

## 2024-03-09 ENCOUNTER — Other Ambulatory Visit: Payer: Self-pay | Admitting: Hematology and Oncology

## 2024-03-09 NOTE — Telephone Encounter (Signed)
 Refilled Letrozole  per last office note.  Cosign requested.

## 2024-03-15 ENCOUNTER — Other Ambulatory Visit: Payer: Self-pay | Admitting: Family Medicine

## 2024-03-15 DIAGNOSIS — F419 Anxiety disorder, unspecified: Secondary | ICD-10-CM

## 2024-03-16 NOTE — Telephone Encounter (Signed)
 LOV: 07/05/23 (steph) VV 05/03/23 (you) Last fill: 11/04/23 30 tablet 0 refill

## 2024-03-19 ENCOUNTER — Ambulatory Visit (INDEPENDENT_AMBULATORY_CARE_PROVIDER_SITE_OTHER): Admitting: Family Medicine

## 2024-03-19 ENCOUNTER — Encounter: Payer: Self-pay | Admitting: Family Medicine

## 2024-03-19 VITALS — BP 100/74 | HR 64 | Temp 97.9°F | Ht 71.0 in | Wt 167.0 lb

## 2024-03-19 DIAGNOSIS — D229 Melanocytic nevi, unspecified: Secondary | ICD-10-CM

## 2024-03-19 DIAGNOSIS — Z0001 Encounter for general adult medical examination with abnormal findings: Secondary | ICD-10-CM | POA: Diagnosis not present

## 2024-03-19 DIAGNOSIS — F419 Anxiety disorder, unspecified: Secondary | ICD-10-CM

## 2024-03-19 DIAGNOSIS — Z13220 Encounter for screening for lipoid disorders: Secondary | ICD-10-CM | POA: Diagnosis not present

## 2024-03-19 DIAGNOSIS — Z853 Personal history of malignant neoplasm of breast: Secondary | ICD-10-CM

## 2024-03-19 DIAGNOSIS — Z1159 Encounter for screening for other viral diseases: Secondary | ICD-10-CM

## 2024-03-19 DIAGNOSIS — F5101 Primary insomnia: Secondary | ICD-10-CM | POA: Diagnosis not present

## 2024-03-19 LAB — T4, FREE: Free T4: 0.87 ng/dL (ref 0.60–1.60)

## 2024-03-19 LAB — COMPREHENSIVE METABOLIC PANEL WITH GFR
ALT: 23 U/L (ref 0–35)
AST: 36 U/L (ref 0–37)
Albumin: 4.9 g/dL (ref 3.5–5.2)
Alkaline Phosphatase: 84 U/L (ref 39–117)
BUN: 9 mg/dL (ref 6–23)
CO2: 30 meq/L (ref 19–32)
Calcium: 10.4 mg/dL (ref 8.4–10.5)
Chloride: 101 meq/L (ref 96–112)
Creatinine, Ser: 0.68 mg/dL (ref 0.40–1.20)
GFR: 106.3 mL/min (ref >60.00)
Glucose, Bld: 89 mg/dL (ref 70–99)
Potassium: 3.5 meq/L (ref 3.5–5.1)
Sodium: 140 meq/L (ref 135–145)
Total Bilirubin: 0.5 mg/dL (ref 0.2–1.2)
Total Protein: 8.1 g/dL (ref 6.0–8.3)

## 2024-03-19 LAB — LIPID PANEL
Cholesterol: 249 mg/dL — ABNORMAL HIGH (ref 0–200)
HDL: 108.5 mg/dL (ref >39.00)
LDL Cholesterol: 132 mg/dL — ABNORMAL HIGH (ref 0–99)
NonHDL: 140.71
Total CHOL/HDL Ratio: 2
Triglycerides: 46 mg/dL (ref 0.0–149.0)
VLDL: 9.2 mg/dL (ref 0.0–40.0)

## 2024-03-19 LAB — CBC WITH DIFFERENTIAL/PLATELET
Basophils Absolute: 0.1 K/uL (ref 0.0–0.1)
Basophils Relative: 1.1 % (ref 0.0–3.0)
Eosinophils Absolute: 0.1 K/uL (ref 0.0–0.7)
Eosinophils Relative: 1.5 % (ref 0.0–5.0)
HCT: 41.6 % (ref 36.0–46.0)
Hemoglobin: 13.6 g/dL (ref 12.0–15.0)
Lymphocytes Relative: 39.7 % (ref 12.0–46.0)
Lymphs Abs: 1.9 K/uL (ref 0.7–4.0)
MCHC: 32.6 g/dL (ref 30.0–36.0)
MCV: 89.3 fl (ref 78.0–100.0)
Monocytes Absolute: 0.4 K/uL (ref 0.1–1.0)
Monocytes Relative: 7.5 % (ref 3.0–12.0)
Neutro Abs: 2.4 K/uL (ref 1.4–7.7)
Neutrophils Relative %: 50.2 % (ref 43.0–77.0)
Platelets: 197 K/uL (ref 150.0–400.0)
RBC: 4.66 Mil/uL (ref 3.87–5.11)
RDW: 13.1 % (ref 11.5–15.5)
WBC: 4.8 K/uL (ref 4.0–10.5)

## 2024-03-19 LAB — TSH: TSH: 1.02 u[IU]/mL (ref 0.35–5.50)

## 2024-03-19 MED ORDER — LORAZEPAM 0.5 MG PO TABS: 0.5000 mg | ORAL_TABLET | Freq: Every day | ORAL | 0 refills | Status: AC | PRN

## 2024-03-19 NOTE — Progress Notes (Signed)
 Complete physical exam  Patient: Natasha Torres   DOB: 1981-01-10   43 y.o. Female  MRN: [id number]  Subjective:    Chief Complaint  Patient presents with   Annual Exam   She is here for a complete physical exam.   Discussed the use of AI scribe software for clinical note transcription with the patient, who gave verbal consent to proceed.  History of Present Illness KUSHI KUN is a 43 year old female with breast cancer who presents for an annual physical exam and follow-up on chronic health conditions.  Breast neoplasm surveillance - Diagnosed with breast cancer - Gardasil Reveal blood test in April showed no cancer activity per patient   Weight gain and lifestyle factors - Weight gain since December, attributed to a tragic event and changes in work environment - Works from home, finds it more relaxing but has inconsistent eating habits - Engages in regular exercise but has difficulty losing weight  Sleep disturbance and anxiolytic use - Uses lorazepam  infrequently for sleep during periods of stress, taking half a dose as needed - Refilled lorazepam  prescription twice in the past year, most recently in May  Cutaneous findings - History of cherry angiomas and hypopigmentation - No history of skin cancer  - Previous skin biopsies performed - Plans to return to dermatology for a skin check  Musculoskeletal symptoms - Popping sound in hip for the past few days without associated pain  Nutritional supplementation and energy levels - Takes B12, magnesium , D3, multivitamin, omega-3 fatty acids, and turmeric supplements - Energy levels generally good     Health Maintenance  Topic Date Due   Hepatitis C Screening  Never done   Hepatitis B Vaccine (1 of 3 - 19+ 3-dose series) Never done   HPV Vaccine (1 - Risk 3-dose SCDM series) Never done   DTaP/Tdap/Td vaccine (2 - Td or Tdap) 06/18/2021   Pap with HPV screening  10/20/2021   COVID-19 Vaccine (1) 04/04/2024*    Flu Shot  09/15/2024*   HIV Screening  Completed   Pneumococcal Vaccine  Aged Out   Meningitis B Vaccine  Aged Out  *Topic was postponed. The date shown is not the original due date.    Wears seatbelt always, uses sunscreen, smoke detectors in home and functioning, does not text while driving, feels safe in home environment.  Depression screening:    03/19/2024    3:09 PM 05/03/2023   11:02 AM 03/22/2023    8:24 AM  Depression screen PHQ 2/9  Decreased Interest 0 0 0  Down, Depressed, Hopeless 0 0 0  PHQ - 2 Score 0 0 0   Anxiety Screening:    11/19/2018    9:05 AM 08/28/2018   12:43 PM  GAD 7 : Generalized Anxiety Score  Nervous, Anxious, on Edge 0 3  Control/stop worrying 0 3  Worry too much - different things 0 3  Trouble relaxing 0 3  Restless 0 2  Easily annoyed or irritable 0 3  Afraid - awful might happen 0 1  Total GAD 7 Score 0 18    Vision:Within last year and Dental: No current dental problems and Receives regular dental care  Patient Active Problem List   Diagnosis Date Noted   Anxiety 11/04/2023   Anemia 03/22/2023   Elevated liver enzymes 03/22/2023   Primary insomnia 03/22/2023   Monoallelic mutation of ATM gene 98/76/7975   Anxiety and depression 08/28/2018   Iron deficiency anemia 06/05/2016   Vitamin D   deficiency 06/05/2016   Lymphedema 04/04/2015   S/P bilateral mastectomy 02/10/2015   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 of post-op N/V   History of  chemotherapy 2016   for left breast cancer - carboplatin , taxotere , perjeta , herceptin    History of chemotherapy 2022   Began Kadcyla  every 21 days x 4 cycles, 09/07/20 - 11/10/20. After surgery on 11/29/20 pt began maintenance Kadcyla  for 1 year.   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 Double mastectomy 8/16, chemo     Recurrent breast cancer, left (HCC) dx'd 07/2020   Past Surgical History:  Procedure Laterality Date   BREAST BIOPSY  08/12/2020   BREAST LUMPECTOMY Left 08/17/2014   BREAST RECONSTRUCTION WITH PLACEMENT OF TISSUE EXPANDER AND FLEX HD (ACELLULAR HYDRATED DERMIS) Bilateral 02/10/2015   Procedure: BREAST RECONSTRUCTION WITH PLACEMENT OF TISSUE EXPANDER AND FLEX HD (ACELLULAR HYDRATED DERMIS);  Surgeon: Earlis Ranks, MD;  Location: [facility] OR;  Service: Plastics;  Laterality: Bilateral;   INCISION AND DRAINAGE OF WOUND Left 03/02/2015   Procedure: IRRIGATION BREAST POCKET AND EXCHANGE OF LEFT BREAST TISSUE EXPANDER;  Surgeon: Earlis Ranks, MD;  Location: [facility] OR;  Service: Plastics;  Laterality: Left;   LAPAROSCOPIC SALPINGO OOPHERECTOMY Left 05/29/2021   Procedure: LAPAROSCOPIC LEFT SALPINGO OOPHORECTOMY; PELVIC WASHING;  Surgeon: Marne Kelly Nest, MD;  Location: [facility];  Service: Gynecology;  Laterality: Left;  Procedure #2   LIPOSUCTION WITH LIPOFILLING Bilateral 06/07/2015   Procedure: LIPOSUCTION WITH LIPOFILLING TO BILATERAL CHEST;  Surgeon: Earlis Ranks, MD;  Location: [facility];  Service: Plastics;  Laterality: Bilateral;   LIPOSUCTION WITH LIPOFILLING Bilateral 09/23/2015   Procedure: LIPOFILLING FROM BILATERAL THIGHS TO BILATERAL CHEST ;  Surgeon: Earlis Ranks, MD;  Location: [facility];  Service: Plastics;  Laterality: Bilateral;   LIPOSUCTION WITH LIPOFILLING Bilateral  06/15/2016   Procedure: LIPOFILLING FROM BILATERAL THIGH TO BILATERAL CHEST;  Surgeon: Earlis Ranks, MD;  Location: [facility];  Service: Plastics;  Laterality: Bilateral;  LIPOFILLING FROM BILATERAL THIGH TO BILATERAL CHEST   MASTECTOMY Bilateral 02/10/2015   NIPPLE SPARING MASTECTOMY/SENTINAL LYMPH NODE BIOPSY/RECONSTRUCTION/PLACEMENT OF TISSUE EXPANDER Bilateral 02/10/2015   Procedure: BILATERAL  NIPPLE SPARING MASTECTOMY WITH LEFT  SENTINAL LYMPH NODE BIOPSY(RIGHT BREAST PROPHYLACTIC);  Surgeon: Donnice Bury, MD;  Location: [facility];  Service: General;  Laterality: Bilateral;   ORIF FINGER / THUMB FRACTURE Right 06/18/1992   thumb   PORT-A-CATH REMOVAL  01/07/2015   removed and replaced   PORT-A-CATH REMOVAL Right 09/23/2015   Procedure: REMOVAL PORT-A-CATH;  Surgeon: Earlis Ranks, MD;  Location:  SURGERY CENTER;  Service: Plastics;  Laterality: Right;   PORT-A-CATH REMOVAL Right 05/29/2021   Procedure: REMOVAL PORT-A-CATH;  Surgeon: Bury Donnice, MD;  Location: [facility];  Service: General;  Laterality: Right;  procedure #1   PORTACATH PLACEMENT  08/2014; 01/07/2015   PORTACATH PLACEMENT Right 09/06/2020   Procedure: INSERTION PORT-A-CATH;  Surgeon: Ebbie Cough, MD;  Location: [facility];  Service: General;  Laterality: Right;   RADIOACTIVE SEED GUIDED EXCISIONAL BREAST BIOPSY Left 11/29/2020   Procedure: RADIOACTIVE SEED GUIDED EXCISION OF LEFT BREAST CANCER x2;  Surgeon: Ebbie Cough, MD;  Location: [facility];  Service: General;  Laterality: Left;   REMOVAL OF BILATERAL TISSUE EXPANDERS WITH PLACEMENT OF BILATERAL BREAST IMPLANTS Bilateral 06/07/2015   Procedure: REMOVAL OF BILATERAL TISSUE EXPANDERS WITH PLACEMENT OF BILATERAL BREAST  SILICONE IMPLANTS;  Surgeon: Earlis Ranks, MD;  Location: [facility];  Service: Plastics;  Laterality: Bilateral;   REMOVAL OF TISSUE EXPANDER AND PLACEMENT  OF IMPLANT Right 02/27/2015   Procedure: REMOVAL OF TISSUE EXPANDER AND PLACEMENT OF NEW TISSUE EXPANDER;  Surgeon: Earlis Ranks, MD;  Location: [facility] OR;  Service: Plastics;  Laterality: Right;   SALPINGOOPHORECTOMY Right 11/22/2010   due to dermoid cyst   TISSUE EXPANDER PLACEMENT Left 03/02/2015   Procedure: TISSUE EXPANDER;  Surgeon: Earlis Ranks, MD;  Location: [facility] OR;  Service: Plastics;  Laterality: Left;   WISDOM TOOTH EXTRACTION  06/19/1999   Social History   Tobacco Use   Smoking status: Never   Smokeless tobacco: Never  Vaping Use   Vaping status: Never Used  Substance Use Topics   Alcohol use: Not Currently   Drug use: Yes    Types: Marijuana    Comment: Patient hasn't used in 2 years as of 05/24/21 per pt.      Patient Care Team: Lendia Boby CROME, NP-C as PCP - General (Family Medicine) Odean Potts, MD as Consulting Physician (Hematology and Oncology) Ebbie Cough, MD as Consulting Physician (General Surgery) Ranks Earlis, MD as Consulting Physician (Plastic Surgery) Tyree Nanetta SAILOR, RN as Oncology Nurse Navigator Moses, Powell Hummer, NP as Nurse Practitioner (Hematology and Oncology) Estelle Service, MD as Consulting Physician (Obstetrics and Gynecology)   Outpatient Medications Prior to Visit  Medication Sig   Black Pepper-Turmeric 3-500 MG CAPS Take by mouth. Stopped  on 12/5 for 05/29/21 surgery.   Cholecalciferol (VITAMIN D3) 50 MCG (2000 UT) TABS Take 2,000 Units by mouth at bedtime.   Cyanocobalamin  (B-12) 100 MCG TABS Take by mouth. Patient takes 1 tablet at lunch time.   letrozole  (FEMARA ) 2.5 MG tablet TAKE 1 TABLET BY MOUTH EVERY DAY   Magnesium  Hydroxide 400 MG CHEW Chew 400 mg by mouth at bedtime. Stopped on 12/5 until 05/29/21 surgery.   melatonin 5 MG TABS Take 5 mg by mouth at bedtime as needed. Pt stopped on 12/5 for 05/29/21 surgery.   Multiple Vitamins-Minerals (ADULT ONE DAILY GUMMIES PO) Take 2 tablets by mouth at  bedtime. Stopped 12/5 until 05/29/21 surgery.   NON FORMULARY Echinacea Tincture PRN . Pt only takes if she feels like she is coming down with something.   Omega-3 Fatty Acids (OMEGA 3 PO) Take by mouth.   [DISCONTINUED] albuterol  (VENTOLIN  HFA) 108 (90 Base) MCG/ACT inhaler Inhale 2 puffs into the lungs every 6 (six) hours as needed for wheezing or shortness of breath.   [DISCONTINUED] LORazepam  (ATIVAN ) 0.5 MG tablet TAKE 1 TABLET (0.5 MG TOTAL) BY MOUTH DAILY AS NEEDED. FOR ANXIETY   [DISCONTINUED] traZODone  (DESYREL ) 50 MG tablet Take 1 tablet (50 mg total) by mouth at bedtime.   No facility-administered medications prior to visit.    Review of Systems  Constitutional:  Negative for chills and  fever.  HENT:  Negative for congestion, ear pain, sinus pain and sore throat.   Eyes:  Negative for blurred vision, double vision and pain.  Respiratory:  Negative for cough, shortness of breath and wheezing.   Cardiovascular:  Negative for chest pain, palpitations and leg swelling.  Gastrointestinal:  Negative for abdominal pain, constipation, diarrhea, nausea and vomiting.  Genitourinary:  Negative for dysuria, frequency and urgency.  Musculoskeletal:  Negative for back pain, joint pain and myalgias.  Neurological:  Negative for dizziness, tingling, focal weakness and headaches.  Psychiatric/Behavioral:  Negative for depression. The patient is not nervous/anxious.        Objective:    BP 100/74   Pulse 64   Temp 97.9 F (36.6 C) (Temporal)   Ht 5' 11 (1.803 m)   Wt 167 lb (75.8 kg)   SpO2 95%   BMI 23.29 kg/m  BP Readings from Last 3 Encounters:  03/19/24 100/74  07/05/23 122/74  03/22/23 104/66   Wt Readings from Last 3 Encounters:  03/19/24 167 lb (75.8 kg)  07/05/23 154 lb (69.9 kg)  03/22/23 154 lb (69.9 kg)    Physical Exam Constitutional:      General: She is not in acute distress.    Appearance: She is not ill-appearing.  HENT:     Right Ear: Tympanic membrane,  ear canal and external ear normal.     Left Ear: Tympanic membrane, ear canal and external ear normal.     Nose: Nose normal.     Mouth/Throat:     Mouth: Mucous membranes are moist.     Pharynx: Oropharynx is clear.  Eyes:     Extraocular Movements: Extraocular movements intact.     Conjunctiva/sclera: Conjunctivae normal.     Pupils: Pupils are equal, round, and reactive to light.  Neck:     Thyroid : No thyroid  mass, thyromegaly or thyroid  tenderness.  Cardiovascular:     Rate and Rhythm: Normal rate and regular rhythm.     Pulses: Normal pulses.     Heart sounds: Normal heart sounds.  Pulmonary:     Effort: Pulmonary effort is normal.     Breath sounds: Normal breath sounds.  Abdominal:     General: Bowel sounds are normal.     Palpations: Abdomen is soft.     Tenderness: There is no abdominal tenderness. There is no right CVA tenderness, left CVA tenderness, guarding or rebound.  Musculoskeletal:        General: Normal range of motion.     Cervical back: Normal range of motion and neck supple. No tenderness.     Right lower leg: No edema.     Left lower leg: No edema.  Lymphadenopathy:     Cervical: No cervical adenopathy.  Skin:    General: Skin is warm and dry.     Findings: No rash.  Neurological:     General: No focal deficit present.     Mental Status: She is alert and oriented to person, place, and time.     Cranial Nerves: No cranial nerve deficit.     Sensory: No sensory deficit.     Motor: No weakness.     Gait: Gait normal.  Psychiatric:        Mood and Affect: Mood normal.        Behavior: Behavior normal.        Thought Content: Thought content normal.      Results for orders placed or performed in visit on 03/19/24  Lipid panel  Result Value Ref Range   Cholesterol 249 (H) 0 - 200 mg/dL   Triglycerides 53.9 0.0 - 149.0 mg/dL   HDL 891.49 >60.99 mg/dL   VLDL 9.2 0.0 - 59.9 mg/dL   LDL Cholesterol 867 (H) 0 - 99 mg/dL   Total CHOL/HDL Ratio 2     NonHDL 140.71   T4, free  Result Value Ref Range   Free T4 0.87 0.60 - 1.60 ng/dL  TSH  Result Value Ref Range   TSH 1.02 0.35 - 5.50 uIU/mL  Comprehensive metabolic panel with GFR  Result Value Ref Range   Sodium 140 135 - 145 mEq/L   Potassium 3.5 3.5 - 5.1 mEq/L   Chloride 101 96 - 112 mEq/L   CO2 30 19 - 32 mEq/L   Glucose, Bld 89 70 - 99 mg/dL   BUN 9 6 - 23 mg/dL   Creatinine, Ser 9.31 0.40 - 1.20 mg/dL   Total Bilirubin 0.5 0.2 - 1.2 mg/dL   Alkaline Phosphatase 84 39 - 117 U/L   AST 36 0 - 37 U/L   ALT 23 0 - 35 U/L   Total Protein 8.1 6.0 - 8.3 g/dL   Albumin  4.9 3.5 - 5.2 g/dL   GFR 893.69 >39.99 mL/min   Calcium 10.4 8.4 - 10.5 mg/dL  CBC with Differential/Platelet  Result Value Ref Range   WBC 4.8 4.0 - 10.5 K/uL   RBC 4.66 3.87 - 5.11 Mil/uL   Hemoglobin 13.6 12.0 - 15.0 g/dL   HCT 58.3 63.9 - 53.9 %   MCV 89.3 78.0 - 100.0 fl   MCHC 32.6 30.0 - 36.0 g/dL   RDW 86.8 88.4 - 84.4 %   Platelets 197.0 150.0 - 400.0 K/uL   Neutrophils Relative % 50.2 43.0 - 77.0 %   Lymphocytes Relative 39.7 12.0 - 46.0 %   Monocytes Relative 7.5 3.0 - 12.0 %   Eosinophils Relative 1.5 0.0 - 5.0 %   Basophils Relative 1.1 0.0 - 3.0 %   Neutro Abs 2.4 1.4 - 7.7 K/uL   Lymphs Abs 1.9 0.7 - 4.0 K/uL   Monocytes Absolute 0.4 0.1 - 1.0 K/uL   Eosinophils Absolute 0.1 0.0 - 0.7 K/uL   Basophils Absolute 0.1 0.0 - 0.1 K/uL      Assessment & Plan:    Routine Health Maintenance and Physical Exam Problem List Items Addressed This Visit     Anxiety   Relevant Medications   LORazepam  (ATIVAN ) 0.5 MG tablet   Primary insomnia   Relevant Orders   TSH (Completed)   T4, free (Completed)   Other Visit Diagnoses       Encounter for general adult medical examination with abnormal findings    -  Primary   Relevant Orders   CBC with Differential/Platelet (Completed)   Comprehensive metabolic panel with GFR (Completed)     History of breast cancer         Multiple nevi        Relevant Orders   Ambulatory referral to Dermatology     Screening for lipid disorders       Relevant Orders   Lipid panel (Completed)     Encounter for screening for other viral diseases       Relevant Orders   Hepatitis C antibody       Assessment and Plan Assessment & Plan Adult Wellness Visit Annual physical exam conducted. Weight gain may be related to stress and inconsistent eating habits. No  significant concerns during the physical exam. Discussed preventive health care and screenings. - Order blood work including hepatitis C screening - Refer to dermatology for skin check - Call and schedule appointment with OB GYN - Remove trazodone  and albuterol  from medication list  Anxiety disorder Anxiety managed with lorazepam  as needed. She reports infrequent use, only during periods of increased stress. - Prescribe lorazepam  0.5 MG as needed for anxiety  Insomnia Intermittent insomnia managed with lorazepam  as needed, particularly during periods of stress. She reports using lorazepam  infrequently - Prescribe lorazepam  0.5 MG prn  - Schedule follow-up every six months for controlled substance prescription     Return in about 6 months (around 09/17/2024) for chronic health conditions.     Boby Mackintosh, NP-C

## 2024-03-19 NOTE — Patient Instructions (Signed)
 Please go downstairs for labs.

## 2024-03-20 ENCOUNTER — Encounter: Payer: Self-pay | Admitting: Family Medicine

## 2024-03-20 LAB — HEPATITIS C ANTIBODY: Hepatitis C Ab: NONREACTIVE

## 2024-03-22 ENCOUNTER — Ambulatory Visit: Payer: Self-pay | Admitting: Family Medicine

## 2024-03-23 NOTE — Telephone Encounter (Signed)
 Addressed, please see lab result encounter

## 2024-04-20 ENCOUNTER — Encounter: Payer: Self-pay | Admitting: Hematology and Oncology

## 2024-05-08 ENCOUNTER — Encounter: Payer: Self-pay | Admitting: *Deleted

## 2024-05-08 NOTE — Progress Notes (Signed)
 Received message from [facility] Reveal team stating pt does not wish to continue with testing at this time.

## 2024-05-27 ENCOUNTER — Ambulatory Visit: Payer: Self-pay

## 2024-05-27 NOTE — Telephone Encounter (Signed)
 FYI Only or Action Required?: FYI only for provider: appointment scheduled on 05/28/24.  Patient was last seen in primary care on 03/19/2024 by Lendia Boby CROME, NP-C.  Called Nurse Triage reporting Diarrhea.  Symptoms began several days ago.  Interventions attempted: OTC medications: Pepto Bismol.  Symptoms are: stable.  Triage Disposition: See Physician Within 24 Hours  Patient/caregiver understands and will follow disposition?: Yes Reason for Disposition  [1] SEVERE diarrhea (e.g., 7 or more times / day more than normal) AND [2] present > 24 hours (1 day)  Answer Assessment - Initial Assessment Questions Tried pepto bismol yesterday, stopped it for the day but woke up with it today. Denies new foods or medications. Patient reports her dog being treated for Giardia and is concerned she got that from her dog. Patient has an oura ring and it states her overall body temperature the last few days was increased.   1. DIARRHEA SEVERITY: How bad is the diarrhea? How many more stools have you had in the past 24 hours than normal?      5 times just this morning  2. ONSET: When did the diarrhea begin?      4 days   3. STOOL DESCRIPTION:  How loose or watery is the diarrhea? What is the stool color? Is there any blood or mucous in the stool?     Watery  4. VOMITING: Are you also vomiting? If Yes, ask: How many times in the past 24 hours?      Denies  5. ABDOMEN PAIN: Are you having any abdomen pain? If Yes, ask: What does it feel like? (e.g., crampy, dull, intermittent, constant)      Better now than the first few days, middle of stomach under naval  6. ABDOMEN PAIN SEVERITY: If present, ask: How bad is the pain?  (e.g., Scale 1-10; mild, moderate, or severe)     Uncomfortable   7. ORAL INTAKE: If vomiting, Have you been able to drink liquids? How much liquids have you had in the past 24 hours?     Yes  8. HYDRATION: Any signs of dehydration? (e.g., dry  mouth [not just dry lips], too weak to stand, dizziness, new weight loss) When did you last urinate?     Denies  9. OTHER SYMPTOMS: Do you have any other symptoms? (e.g., fever, blood in stool)       Bloating, gas, fatigue  Protocols used: Diarrhea-A-AH  Copied from CRM #[id number]. Topic: Clinical - Red Word Triage >> May 27, 2024  8:11 AM Tiffini S wrote: [facility] that prompted transfer to Nurse Triage:  Uncontrollable diarrhea for four days with bloating, gas and pain- have been on toilet all morning   Said her dog is being treated with medication for illness that she may have contracted

## 2024-05-28 ENCOUNTER — Ambulatory Visit: Admitting: Family Medicine

## 2024-05-28 NOTE — Progress Notes (Deleted)
° °  Acute Office Visit  Subjective:     Patient ID: Natasha Torres, female    DOB: 09/29/1980, 43 y.o.   MRN: [id number]  No chief complaint on file.   HPI  Discussed the use of AI scribe software for clinical note transcription with the patient, who gave verbal consent to proceed.  History of Present Illness      ROS Per HPI      Objective:    There were no vitals taken for this visit.   Physical Exam Vitals and nursing note reviewed.  Constitutional:      General: She is not in acute distress.    Appearance: Normal appearance. She is normal weight.  HENT:     Head: Normocephalic and atraumatic.     Right Ear: External ear normal.     Left Ear: External ear normal.     Nose: Nose normal.     Mouth/Throat:     Mouth: Mucous membranes are moist.     Pharynx: Oropharynx is clear.  Eyes:     Extraocular Movements: Extraocular movements intact.     Pupils: Pupils are equal, round, and reactive to light.  Cardiovascular:     Rate and Rhythm: Normal rate and regular rhythm.     Pulses: Normal pulses.     Heart sounds: Normal heart sounds.  Pulmonary:     Effort: Pulmonary effort is normal. No respiratory distress.     Breath sounds: Normal breath sounds. No wheezing, rhonchi or rales.  Musculoskeletal:        General: Normal range of motion.     Cervical back: Normal range of motion.     Right lower leg: No edema.     Left lower leg: No edema.  Lymphadenopathy:     Cervical: No cervical adenopathy.  Neurological:     General: No focal deficit present.     Mental Status: She is alert and oriented to person, place, and time.  Psychiatric:        Mood and Affect: Mood normal.        Thought Content: Thought content normal.     No results found for any visits on 05/28/24.      Assessment & Plan:   Assessment and Plan Assessment & Plan      No orders of the defined types were placed in this encounter.    No orders of the defined types were  placed in this encounter.   No follow-ups on file.  Corean LITTIE Ku, FNP

## 2024-10-22 ENCOUNTER — Ambulatory Visit: Admitting: Dermatology

## 2024-10-26 ENCOUNTER — Telehealth: Admitting: Hematology and Oncology
